# Patient Record
Sex: Female | Born: 1945
Health system: Southern US, Community
[De-identification: ages and names within clinical notes are randomized; demographics above are authoritative.]

## PROBLEM LIST (undated history)

## (undated) DIAGNOSIS — M1990 Unspecified osteoarthritis, unspecified site: Secondary | ICD-10-CM

## (undated) DIAGNOSIS — J45909 Unspecified asthma, uncomplicated: Secondary | ICD-10-CM

## (undated) DIAGNOSIS — R011 Cardiac murmur, unspecified: Secondary | ICD-10-CM

## (undated) DIAGNOSIS — K219 Gastro-esophageal reflux disease without esophagitis: Secondary | ICD-10-CM

## (undated) DIAGNOSIS — L0390 Cellulitis, unspecified: Secondary | ICD-10-CM

## (undated) DIAGNOSIS — I10 Essential (primary) hypertension: Secondary | ICD-10-CM

## (undated) DIAGNOSIS — I8390 Asymptomatic varicose veins of unspecified lower extremity: Secondary | ICD-10-CM

## (undated) DIAGNOSIS — C50919 Malignant neoplasm of unspecified site of unspecified female breast: Secondary | ICD-10-CM

## (undated) DIAGNOSIS — R7301 Impaired fasting glucose: Secondary | ICD-10-CM

## (undated) DIAGNOSIS — N649 Disorder of breast, unspecified: Secondary | ICD-10-CM

## (undated) DIAGNOSIS — Z8719 Personal history of other diseases of the digestive system: Secondary | ICD-10-CM

## (undated) DIAGNOSIS — N816 Rectocele: Secondary | ICD-10-CM

## (undated) DIAGNOSIS — E785 Hyperlipidemia, unspecified: Secondary | ICD-10-CM

## (undated) DIAGNOSIS — N63 Unspecified lump in unspecified breast: Secondary | ICD-10-CM

## (undated) HISTORY — DX: Disorder of breast, unspecified: N64.9

## (undated) HISTORY — DX: Unspecified lump in unspecified breast: N63.0

## (undated) HISTORY — PX: BREAST SURGERY: SHX581

## (undated) HISTORY — PX: HERNIA REPAIR: SHX51

## (undated) HISTORY — DX: Hyperlipidemia, unspecified: E78.5

## (undated) HISTORY — DX: Unspecified asthma, uncomplicated: J45.909

## (undated) HISTORY — DX: Essential (primary) hypertension: I10

## (undated) HISTORY — DX: Malignant neoplasm of unspecified site of unspecified female breast: C50.919

## (undated) HISTORY — PX: VAGINAL HYSTERECTOMY: SUR661

## (undated) HISTORY — DX: Cellulitis, unspecified: L03.90

## (undated) HISTORY — PX: LASER ABLATION OF VASCULAR LESION: SHX1950

## (undated) HISTORY — DX: Rectocele: N81.6

## (undated) HISTORY — DX: Impaired fasting glucose: R73.01

## (undated) HISTORY — PX: UPPER GASTROINTESTINAL ENDOSCOPY: SHX188

## (undated) HISTORY — PX: COLONOSCOPY: SHX174

---
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
## 1999-12-02 ENCOUNTER — Encounter (INDEPENDENT_AMBULATORY_CARE_PROVIDER_SITE_OTHER): Payer: Self-pay | Admitting: Specialist

## 1999-12-02 ENCOUNTER — Ambulatory Visit (HOSPITAL_BASED_OUTPATIENT_CLINIC_OR_DEPARTMENT_OTHER): Admission: RE | Admit: 1999-12-02 | Discharge: 1999-12-02 | Payer: Self-pay | Admitting: Surgery

## 2001-02-13 ENCOUNTER — Ambulatory Visit (HOSPITAL_COMMUNITY): Admission: RE | Admit: 2001-02-13 | Discharge: 2001-02-13 | Payer: Self-pay | Admitting: Obstetrics and Gynecology

## 2001-02-13 ENCOUNTER — Encounter: Payer: Self-pay | Admitting: Obstetrics and Gynecology

## 2002-02-15 ENCOUNTER — Encounter: Payer: Self-pay | Admitting: Surgery

## 2002-02-15 ENCOUNTER — Ambulatory Visit (HOSPITAL_COMMUNITY): Admission: RE | Admit: 2002-02-15 | Discharge: 2002-02-15 | Payer: Self-pay | Admitting: Surgery

## 2003-01-31 ENCOUNTER — Ambulatory Visit (HOSPITAL_COMMUNITY): Admission: RE | Admit: 2003-01-31 | Discharge: 2003-01-31 | Payer: Self-pay | Admitting: Internal Medicine

## 2003-02-17 ENCOUNTER — Ambulatory Visit (HOSPITAL_COMMUNITY): Admission: RE | Admit: 2003-02-17 | Discharge: 2003-02-17 | Payer: Self-pay | Admitting: Obstetrics and Gynecology

## 2003-02-17 ENCOUNTER — Encounter: Payer: Self-pay | Admitting: Obstetrics and Gynecology

## 2004-06-10 ENCOUNTER — Ambulatory Visit (HOSPITAL_COMMUNITY): Admission: RE | Admit: 2004-06-10 | Discharge: 2004-06-10 | Payer: Self-pay | Admitting: Family Medicine

## 2005-01-18 ENCOUNTER — Ambulatory Visit (HOSPITAL_COMMUNITY): Admission: RE | Admit: 2005-01-18 | Discharge: 2005-01-18 | Payer: Self-pay | Admitting: Family Medicine

## 2005-05-10 ENCOUNTER — Ambulatory Visit: Payer: Self-pay | Admitting: Internal Medicine

## 2005-06-14 ENCOUNTER — Ambulatory Visit (HOSPITAL_COMMUNITY): Admission: RE | Admit: 2005-06-14 | Discharge: 2005-06-14 | Payer: Self-pay | Admitting: Family Medicine

## 2006-06-19 ENCOUNTER — Ambulatory Visit (HOSPITAL_COMMUNITY): Admission: RE | Admit: 2006-06-19 | Discharge: 2006-06-19 | Payer: Self-pay | Admitting: Family Medicine

## 2007-01-22 ENCOUNTER — Ambulatory Visit (HOSPITAL_COMMUNITY): Admission: RE | Admit: 2007-01-22 | Discharge: 2007-01-22 | Payer: Self-pay | Admitting: Family Medicine

## 2007-06-21 ENCOUNTER — Ambulatory Visit (HOSPITAL_COMMUNITY): Admission: RE | Admit: 2007-06-21 | Discharge: 2007-06-21 | Payer: Self-pay | Admitting: Family Medicine

## 2007-06-22 ENCOUNTER — Ambulatory Visit (HOSPITAL_COMMUNITY): Admission: RE | Admit: 2007-06-22 | Discharge: 2007-06-22 | Payer: Self-pay | Admitting: Family Medicine

## 2008-07-09 ENCOUNTER — Ambulatory Visit (HOSPITAL_COMMUNITY): Admission: RE | Admit: 2008-07-09 | Discharge: 2008-07-09 | Payer: Self-pay | Admitting: Family Medicine

## 2009-06-02 ENCOUNTER — Ambulatory Visit (HOSPITAL_COMMUNITY): Admission: RE | Admit: 2009-06-02 | Discharge: 2009-06-02 | Payer: Self-pay | Admitting: General Surgery

## 2009-07-10 ENCOUNTER — Ambulatory Visit (HOSPITAL_COMMUNITY): Admission: RE | Admit: 2009-07-10 | Discharge: 2009-07-10 | Payer: Self-pay | Admitting: Family Medicine

## 2010-07-13 ENCOUNTER — Ambulatory Visit (HOSPITAL_COMMUNITY): Admission: RE | Admit: 2010-07-13 | Payer: Self-pay | Source: Home / Self Care | Admitting: Family Medicine

## 2010-08-22 ENCOUNTER — Encounter: Payer: Self-pay | Admitting: Family Medicine

## 2010-08-31 ENCOUNTER — Ambulatory Visit (HOSPITAL_COMMUNITY)
Admission: RE | Admit: 2010-08-31 | Discharge: 2010-08-31 | Payer: Self-pay | Source: Home / Self Care | Attending: Family Medicine | Admitting: Family Medicine

## 2010-09-03 ENCOUNTER — Emergency Department (HOSPITAL_COMMUNITY)
Admit: 2010-09-03 | Discharge: 2010-09-03 | Disposition: A | Discharge status: Home / Self Care | Payer: BC Managed Care – PPO

## 2010-09-03 ENCOUNTER — Emergency Department (HOSPITAL_COMMUNITY)
Admission: EM | Admit: 2010-09-03 | Discharge: 2010-09-03 | Disposition: A | Discharge status: Home / Self Care | Payer: BC Managed Care – PPO | Attending: Emergency Medicine | Admitting: Emergency Medicine

## 2010-09-03 DIAGNOSIS — K219 Gastro-esophageal reflux disease without esophagitis: Secondary | ICD-10-CM | POA: Insufficient documentation

## 2010-09-03 DIAGNOSIS — K8020 Calculus of gallbladder without cholecystitis without obstruction: Secondary | ICD-10-CM | POA: Insufficient documentation

## 2010-09-03 DIAGNOSIS — R10819 Abdominal tenderness, unspecified site: Secondary | ICD-10-CM | POA: Insufficient documentation

## 2010-09-03 DIAGNOSIS — R110 Nausea: Secondary | ICD-10-CM | POA: Insufficient documentation

## 2010-09-03 DIAGNOSIS — J45909 Unspecified asthma, uncomplicated: Secondary | ICD-10-CM | POA: Insufficient documentation

## 2010-09-03 DIAGNOSIS — R109 Unspecified abdominal pain: Secondary | ICD-10-CM | POA: Insufficient documentation

## 2010-09-03 DIAGNOSIS — M549 Dorsalgia, unspecified: Secondary | ICD-10-CM | POA: Insufficient documentation

## 2010-09-03 DIAGNOSIS — I10 Essential (primary) hypertension: Secondary | ICD-10-CM | POA: Insufficient documentation

## 2010-09-03 DIAGNOSIS — N390 Urinary tract infection, site not specified: Secondary | ICD-10-CM | POA: Insufficient documentation

## 2010-09-03 DIAGNOSIS — E785 Hyperlipidemia, unspecified: Secondary | ICD-10-CM | POA: Insufficient documentation

## 2010-09-03 LAB — URINE MICROSCOPIC-ADD ON

## 2010-09-03 LAB — CBC
HCT: 36.1 % (ref 36.0–46.0)
Hemoglobin: 12.8 g/dL (ref 12.0–15.0)
MCH: 32.5 pg (ref 26.0–34.0)
MCHC: 35.5 g/dL (ref 30.0–36.0)
MCV: 91.6 fL (ref 78.0–100.0)
Platelets: 269 10*3/uL (ref 150–400)
RBC: 3.94 MIL/uL (ref 3.87–5.11)
RDW: 12.1 % (ref 11.5–15.5)
WBC: 11.9 10*3/uL — ABNORMAL HIGH (ref 4.0–10.5)

## 2010-09-03 LAB — URINALYSIS, ROUTINE W REFLEX MICROSCOPIC
Bilirubin Urine: NEGATIVE
Ketones, ur: NEGATIVE mg/dL
Nitrite: NEGATIVE
Protein, ur: NEGATIVE mg/dL
Specific Gravity, Urine: 1.02 (ref 1.005–1.030)
Urine Glucose, Fasting: NEGATIVE mg/dL
Urobilinogen, UA: 0.2 mg/dL (ref 0.0–1.0)
pH: 7 (ref 5.0–8.0)

## 2010-09-03 LAB — DIFFERENTIAL
Basophils Absolute: 0 K/uL (ref 0.0–0.1)
Basophils Relative: 0 % (ref 0–1)
Eosinophils Absolute: 0.1 10*3/uL (ref 0.0–0.7)
Eosinophils Relative: 1 % (ref 0–5)
Lymphocytes Relative: 9 % — ABNORMAL LOW (ref 12–46)
Lymphs Abs: 1.1 10*3/uL (ref 0.7–4.0)
Monocytes Absolute: 0.9 10*3/uL (ref 0.1–1.0)
Monocytes Relative: 8 % (ref 3–12)
Neutro Abs: 9.8 10*3/uL — ABNORMAL HIGH (ref 1.7–7.7)
Neutrophils Relative %: 83 % — ABNORMAL HIGH (ref 43–77)

## 2010-09-03 LAB — COMPREHENSIVE METABOLIC PANEL
ALT: 38 U/L — ABNORMAL HIGH (ref 0–35)
AST: 39 U/L — ABNORMAL HIGH (ref 0–37)
Alkaline Phosphatase: 92 U/L (ref 39–117)
CO2: 27 mEq/L (ref 19–32)
Calcium: 9.5 mg/dL (ref 8.4–10.5)
Chloride: 94 mEq/L — ABNORMAL LOW (ref 96–112)
Creatinine, Ser: 0.62 mg/dL (ref 0.4–1.2)
GFR calc non Af Amer: >60 mL/min (ref >60)
Glucose, Bld: 122 mg/dL — ABNORMAL HIGH (ref 70–99)
Potassium: 3.5 mEq/L (ref 3.5–5.1)
Total Bilirubin: 1 mg/dL (ref 0.3–1.2)
Total Protein: 7.3 g/dL (ref 6.0–8.3)

## 2010-09-03 LAB — LIPASE, BLOOD: Lipase: 16 U/L (ref 11–59)

## 2010-09-03 LAB — COMPREHENSIVE METABOLIC PANEL WITH GFR
Albumin: 4 g/dL (ref 3.5–5.2)
BUN: 5 mg/dL — ABNORMAL LOW (ref 6–23)
GFR calc Af Amer: >60 mL/min (ref >60)
Sodium: 130 meq/L — ABNORMAL LOW (ref 135–145)

## 2010-09-07 ENCOUNTER — Encounter (HOSPITAL_COMMUNITY): Payer: BC Managed Care – PPO

## 2010-09-07 ENCOUNTER — Other Ambulatory Visit (HOSPITAL_COMMUNITY): Payer: Self-pay | Admitting: Family Medicine

## 2010-09-07 ENCOUNTER — Other Ambulatory Visit: Payer: Self-pay | Admitting: General Surgery

## 2010-09-07 DIAGNOSIS — Z01812 Encounter for preprocedural laboratory examination: Secondary | ICD-10-CM | POA: Insufficient documentation

## 2010-09-07 LAB — SURGICAL PCR SCREEN: MRSA, PCR: NEGATIVE

## 2010-09-08 ENCOUNTER — Ambulatory Visit (HOSPITAL_COMMUNITY)
Admission: RE | Admit: 2010-09-08 | Discharge: 2010-09-08 | Disposition: A | Discharge status: Home / Self Care | Payer: BC Managed Care – PPO | Source: Ambulatory Visit | Attending: General Surgery | Admitting: General Surgery

## 2010-09-08 ENCOUNTER — Other Ambulatory Visit: Payer: Self-pay | Admitting: General Surgery

## 2010-09-08 DIAGNOSIS — Z7982 Long term (current) use of aspirin: Secondary | ICD-10-CM | POA: Insufficient documentation

## 2010-09-08 DIAGNOSIS — Z79899 Other long term (current) drug therapy: Secondary | ICD-10-CM | POA: Insufficient documentation

## 2010-09-08 DIAGNOSIS — K8010 Calculus of gallbladder with chronic cholecystitis without obstruction: Secondary | ICD-10-CM | POA: Insufficient documentation

## 2010-09-08 DIAGNOSIS — K429 Umbilical hernia without obstruction or gangrene: Secondary | ICD-10-CM | POA: Insufficient documentation

## 2010-09-08 DIAGNOSIS — I10 Essential (primary) hypertension: Secondary | ICD-10-CM | POA: Insufficient documentation

## 2010-09-08 HISTORY — PX: CHOLECYSTECTOMY: SHX55

## 2010-09-09 ENCOUNTER — Other Ambulatory Visit (HOSPITAL_COMMUNITY): Payer: BC Managed Care – PPO

## 2010-09-12 NOTE — Op Note (Signed)
NAME:  Caitlin [name], Caitlin [name]               ACCOUNT NO.:  [id number]  MEDICAL RECORD NO.:  [id number]           PATIENT TYPE:  O  LOCATION:  DAYP                          FACILITY:  [facility]  PHYSICIAN:  Dalia Heading, M.D.  DATE OF BIRTH:  1946/06/15  DATE OF PROCEDURE:  09/08/2010 DATE OF DISCHARGE:                              OPERATIVE REPORT   PREOPERATIVE DIAGNOSES:  Cholecystitis, cholelithiasis, umbilical hernia.  POSTOPERATIVE DIAGNOSES:  Cholecystitis, cholelithiasis, umbilical hernia, hydrops of the gallbladder.  PROCEDURES:  Laparoscopic cholecystectomy, umbilical herniorrhaphy with mesh.  SURGEON:  Dalia Heading, MD  ANESTHESIA:  General endotracheal.  INDICATIONS:  The patient is a 65 year old white female who presents with cholecystitis secondary to cholelithiasis as well as an umbilical hernia which is symptomatic.  The risks and benefits of the procedures including bleeding, infection, hepatobiliary injury, the possibility of recurrence of the hernia, and the possibility of an open procedure were fully explained to the patient, gave informed consent.  PROCEDURE NOTE:  The patient was placed in the supine position.  After induction of general endotracheal anesthesia, the abdomen was prepped and draped using the usual sterile technique with DuraPrep.  Surgical site confirmation was performed.  A supraumbilical incision was made down to the fascia.  A Veress needle was introduced into the abdominal cavity and confirmation of placement was done using the saline drop test.  The abdomen was then insufflated to 16 mmHg pressure.  An 11-mm trocar was introduced into the abdominal cavity under direct visualization without difficulty.  The patient was placed in reversed Trendelenburg position.  An additional 11-mm trocar was placed in the epigastric region and 5-mm trocars was placed in the right upper quadrant and right flank regions.  The liver was inspected and noted to  be somewhat generous, but this was in line with her morbid obesity.  The gallbladder was noted to have a thickened gallbladder wall and distended.  An incision was made into the fundus of the gallbladder and hydrops of the gallbladder was found.  It was decompressed using suction.  The gallbladder was then retracted superiorly and laterally. The dissection was begun around the infundibulum of the gallbladder. The cystic duct was first identified.  The juncture to the infundibulum fully identified.  A stone was in the neck of the gallbladder and this was milked into the lumen of the gallbladder.  A standard Endo-GIA was then placed across the cystic duct and fired.  The cystic artery was likewise ligated and divided using clips.  The gallbladder was freed away from the gallbladder fossa using Bovie electrocautery.  The gallbladder was delivered through the umbilical site using an EndoCatch bag.  It was sent to Pathology for further examination.  Any bleeding was controlled using Bovie electrocautery.  The staple lines were inspected and no abnormal bile leakage or blood was noted.  The right upper quadrant was copiously irrigated with normal saline.  Surgicel was placed into the gallbladder fossa.  All fluid and air were then evacuated from the abdominal cavity prior to the removal of the trocars.  The supraumbilical incision was extended as the patient had  an umbilical hernia.  The umbilicus was freed away from the underlying hernia sac. The hernia sac was excised to the fascia without difficulty.  A large ventral patch was then placed and secured to the fascia using 2-0 Ethibond interrupted sutures.  Fascia was closed over this repair using 0 Ethibond interrupted sutures.  The base of the umbilicus was secured back to the fascia using a 2-0 Vicryl interrupted suture.  The subcutaneous layer was reapproximated using 3-0 Vicryl interrupted sutures.  All skin incisions were closed then  using staples.  A 0.5% Sensorcaine was instilled into all wounds.  Betadine ointment and dry sterile dressings were applied.  All tape and needle counts were correct at the end of the procedure. The patient was extubated in the operating room and went back to the recovery room awake in stable condition.  COMPLICATIONS:  None.  SPECIMEN:  Gallbladder.  BLOOD LOSS:  Minimal.     Dalia Heading, M.D.     MAJ/MEDQ  D:  09/08/2010  T:  09/09/2010  Job:  [id number]  cc:   Corrie Mckusick, M.D. Fax: [phone]  Electronically Signed by Franky Macho M.D. on 09/10/2010 01:24:54 PM

## 2010-09-12 NOTE — H&P (Signed)
  NAME:  Caitlin [name], Caitlin [name]               ACCOUNT NO.:  [id number]  MEDICAL RECORD NO.:  [id number]           PATIENT TYPE:  O  LOCATION:  DAY                           FACILITY:  [facility]  PHYSICIAN:  Dalia Heading, M.D.  DATE OF BIRTH:  05/31/1946  DATE OF ADMISSION:  09/07/2010 DATE OF DISCHARGE:  09/28/2011LH                             HISTORY & PHYSICAL   CHIEF COMPLAINT:  Cholecystitis, cholelithiasis, umbilical hernia.  HISTORY OF PRESENT ILLNESS:  The patient is a 65 year old white female who is referred for evaluation and treatment of biliary colic secondary to cholelithiasis.  She has been having intermittent right upper quadrant abdominal pain with radiation to the flank, nausea, and indigestion for the past few weeks.  It is made worse with fatty foods. No fever, chills, jaundice have been noted.  She also has an enlarging umbilical hernia.  PAST MEDICAL HISTORY:  Includes morbid obesity, extrinsic allergies, hypertension, high cholesterol levels.  PAST SURGICAL HISTORY:  Hysterectomy, breast cyst removal.  CURRENT MEDICATIONS: 1. Omeprazole 40 mg p.o. daily. 2. Singulair 10 mg p.o. daily. 3. Multivitamin 1 tablet p.o. daily. 4. Micardis HCT 80/12.5 mg one-half tablet p.o. daily. 5. Crestor 20 mg p.o. daily. 6. Asmanex metered-dose 1 puff daily p.r.n. 7. Ambien 10 mg p.o. at bedtime p.r.n. 8. Ciprofloxacin 500 mg p.o. b.i.d. 9. Hydrocodone 7.5/325 mg 1-2 tablets p.o. q.4 h. p.r.n. pain.  ALLERGIES:  TETANUS and ACE INHIBITORS.  REVIEW OF SYSTEMS:  The patient denies drinking or smoking.  She denies any other cardiopulmonary difficulties or bleeding disorders.  FAMILY MEDICAL HISTORY:  Noncontributory.  PHYSICAL EXAMINATION:  GENERAL:  The patient is an obese white female in no acute distress. HEENT:  Examination reveals no scleral icterus. LUNGS:  Clear to auscultation with equal breath sounds bilaterally. HEART:  Regular rate and rhythm without S3, S4,  murmurs. ABDOMEN:  Soft and nondistended.  She is tender in the right upper quadrant to palpation.  No hepatosplenomegaly or masses are noted.  A reducible umbilical hernia is present.  DATA:  Ultrasound of the gallbladder reveals cholelithiasis with a normal common bile duct.  Liver enzyme tests are within normal limits except for slightly elevated SGOT and SGPT.  IMPRESSION: 1. Cholecystitis, cholelithiasis. 2. Umbilical hernia.  PLAN:  The patient is scheduled for laparoscopic cholecystectomy with umbilical herniorrhaphy with mesh on September 08, 2010.  The risks and benefits of the procedure including bleeding, infection, hepatobiliary injury, recurrence of the hernia, and the possibility of an open procedure were fully explained to the patient, gave informed consent.     Dalia Heading, M.D.     MAJ/MEDQ  D:  09/07/2010  T:  09/07/2010  Job:  [id number]  cc:   Corrie Mckusick, M.D. Fax: [phone]  Short Stay in [facility]  Electronically Signed by Franky Macho M.D. on 09/10/2010 01:24:52 PM

## 2010-09-13 ENCOUNTER — Ambulatory Visit (HOSPITAL_COMMUNITY)
Admission: RE | Admit: 2010-09-13 | Discharge: 2010-09-13 | Disposition: A | Discharge status: Home / Self Care | Payer: BC Managed Care – PPO | Source: Ambulatory Visit | Attending: Family Medicine | Admitting: Family Medicine

## 2010-12-17 NOTE — Op Note (Signed)
[facility]  Patient:    Caitlin [name], Caitlin [name]                      MRN: [id number] Proc. Date: 12/02/99 Adm. Date:  [id number] Disc. Date: [id number] Attending:  Charlton Haws CC:         Dr. ____________, Jonita Albee             Dr. Almetta Lovely, [facility]             Department of Radiology, [facility], ATTN: Dr. Onalee Hua                           Operative Report  OFFICE MEDICAL RECORD #: [id number]  PREOPERATIVE DIAGNOSIS:  Left breast mass.  POSTOPERATIVE DIAGNOSIS:  Left breast mass.  OPERATION:  Removal of left breast mass.  SURGEON:  Currie Paris, M.D.  ASSISTANTLorin Picket Long, PA-Student.  ANESTHESIA:  MAC.  CLINICAL HISTORY:  This patient is a 65 year old, who has had several small breast masses removed in the past, all of which have been benign.  She has now presented with a new mass in the very medial aspect of the left breast that was solid on ultrasound and a little bit irregular.  DESCRIPTION OF PROCEDURE:  Patient brought to the operating room and the area in question was identified by the patient and myself and marked.  She was then given IV sedation.  The breast was then prepped and draped.  A transverse incision made overlying the mass and with palpation was able to identify it and tried to excise it with some surrounding fibrofatty tissue down almost to the chest wall.  The mass was contained in the specimen, was palpable and suture placed through it for pathology to orient him since there was a fair amount of fatty tissue around it.  The wound was checked for hemostasis and was dry, was closed with 3-0 Vicryl, followed by 4-0 Monocryl subcuticular plus Steri-Strips.  Patient tolerated the procedure well.  There are no operative complications.  All counts were correct. DD:  12/02/99 TD:  12/04/99 Job: [id number]

## 2010-12-17 NOTE — Op Note (Signed)
NAME:  Caitlin [name], Caitlin [name]                         ACCOUNT NO.:  [id number]   MEDICAL RECORD NO.:  [id number]                   PATIENT TYPE:  AMB   LOCATION:  DAY                                  FACILITY:  [facility]   PHYSICIAN:  Lionel December, M.D.                 DATE OF BIRTH:  01-Apr-1946   DATE OF PROCEDURE:  01/31/2003  DATE OF DISCHARGE:                                  PROCEDURE NOTE   PROCEDURE:  Esophagogastroduodenoscopy with esophageal dilatation followed  by total colonoscopy.   INDICATIONS:  Caitlin [name] is a 65 year old Caucasian female with recurrent solid  food dysphagia.  She has history of distal esophageal ring.  Last underwent  EGD in 1998.  She is also undergoing screening colonoscopy.  Both procedures  were reviewed with the patient and informed consent was obtained.   PREMEDICATION:  Cetacaine spray for pharyngeal topical anesthesia, Demerol  50 mg IV, Versed 8 mg IV in divided doses.   FINDINGS:  The procedures were performed in the endoscopy suite.  The  patient's vital signs and O2 saturation were monitored during the procedure  and remained stable.   ESOPHAGOGASTRODUODENOSCOPY:  The patient was placed in the left lateral  recumbent position and endoscope was passed through the oropharynx without  any difficulty into the esophagus.   Esophagus:  Mucosa of the esophagus was normal except she had a triangular  erosion at GE junction along with a ring and sliding hiatal hernia which is  about 4 cm length.  She also had some rings in the distal esophagus but they  were not felt to be critical.   Stomach:  It was empty and distended very well on insufflation.  Folds of  the proximal stomach were normal.  Examination of the mucosa was normal at  body, antrum and pyloric channel, as well as angularis, fundus and cardia  was normal.   Duodenum:  Examination of the bulb and second part of the duodenum was  normal.   The esophagus was dilated by passing 56-French  Maloney dilators through the  esophagus to full insertion.  As the dilator was withdrawn, the endoscope  was passed again.  There were two tears, one at the GE junction, another one  proximal to it.  There was another ring or two.   The endoscope was withdrawn and the patient was prepared for procedure #2.   TOTAL COLONOSCOPY:  Rectal examination performed.  No abnormality noted on  the external or digital exam.  Scope was placed in the rectum and advanced  under vision to the sigmoid colon and beyond.  Preparation was satisfactory.  Scope was passed to the cecum which was identified by the ileocecal valve  and appendiceal orifice.  Pictures were taken for the record.  As the scope  was withdrawn, colonic mucosa was carefully examined.  There were scattered  diverticula in the transverse colon.  A few  more at the descending but most  of the diverticula were at the sigmoid colon and they were moderate in  number.  Mucosa was normal throughout. Rectal mucosa was normal.  Scope was  retroflexed to examine the anorectal junction and small hemorrhoids were  noted below the dentate line.  Endoscope was straightened and withdrawn. The  patient tolerated the procedure well.   FINAL DIAGNOSIS:  1. Two esophageal rings, one at the distal esophagus and the second one at     gastroesophageal junction.  2. Erosive reflux esophagitis and small sliding hiatal hernia.  3. Esophagus dilated by passing 56-French Maloney dilator disrupting both of     these areas.  4. Normal exam of the stomach with second part of the duodenum.  5. Colonic diverticulosis which involves the transverse, descending and     sigmoid colon.  6. Small external hemorrhoids.   RECOMMENDATIONS:  1. Anti-reflux measures.  2. Will start her on omeprazole 20 mg p.o. q.a.m. and feel she needs to stay     on it chronically for at least five days a week.  3. High fiber diet.  If she does not eat a high fiber diet, she may consider      taking Citrucel or equivalent, one tablespoonful daily.                                                 Lionel December, M.D.    NR/MEDQ  D:  01/31/2003  T:  02/01/2003  Job:  [id number]   cc:   Patrica Duel, M.D.  [address], [address]  [place]  [place] [address]  Fax: [phone]

## 2010-12-17 NOTE — Consult Note (Signed)
NAME:  Caitlin [name], Caitlin [name]                    ACCOUNT NO.:  [id number]   MEDICAL RECORD NO.:  [id number]                  PATIENT TYPE:   LOCATION:                                       FACILITY:   PHYSICIAN:  Lionel December, M.D.                 DATE OF BIRTH:  1946-03-06   DATE OF CONSULTATION:  01/27/2003  DATE OF DISCHARGE:                                   CONSULTATION   PRESENTING COMPLAINT:  Solid food dysphagia.   HISTORY OF PRESENT ILLNESS:  Caitlin [name] is a 65 year old Caucasian female who  was initially referred through courtesy of Dr. Nobie Putnam for EGD and ED  because of solid food dysphagia, but she requested office visit prior to  scheduling any procedure.  She has history of distal esophageal ring.  This  was last dilated in July 1998.  At that time she was also documented to have  small sliding hiatal hernia and erosive gastritis.  Her CLOtest was  negative.  She responded to esophageal dilatation.  She also had erosive  reflux esophagitis and was on Prilosec and then an H2 blocker, but she is  not sure how long she stayed on these medications.  She has done well until  last summer when she began to have intermittent solid food dysphagia, and it  gradually has been occurring more and more frequently, and now it has been  every week.  She has difficulty primarily with solids and particularly with  meats.  With few exceptions, she has been able to get a spontaneous relief.  Otherwise she has induced vomiting for relief.  She very rarely experiences  heartburn.  She denies hoarseness, cough, abdominal pain, melena, or rectal  bleeding.  She has a good appetite.  Her weight has been stable over the  last one year.  She watches her diet and drinks diet drinks only.   MEDICATIONS:  She is on:  1. Vitamin E.  2. Calcium with vitamin D daily.  3. Tums p.r.n.  4. Tylenol PM p.r.n.   PAST MEDICAL HISTORY:  1. She has had benign lesion removed from her left breast x3.  2.  She had a hysterectomy in 1980s.  3. She has had tonsillectomy.  4. History of Schatzki's ring as, above, which was dilated in July 1998.   ALLERGIES:  TETANUS TOXOID, which apparently resulted in asystole.   FAMILY HISTORY:  Mother is 41 years old and has dementia.  Father is 53.  He  has been treated for prostate CA, and he also had low anterior resection for  colon carcinoma within the last year or so.  She has three sisters.  One has  breathing problems, and others are in good health.   SOCIAL HISTORY:  She is married, and she has two healthy children.  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  She does not smoke  cigarettes and drinks alcohol socially, 0-1 drink per  day.   PHYSICAL EXAMINATION:  GENERAL:  Pleasant, mildly obese Caucasian female who  is in no acute distress.  VITAL SIGNS:  She weighs 231 pounds.  She is 5 feet 6 inches tall.  Pulse 82  per minute, blood pressure 140/100, temperature is 98.5.  HEENT:  Conjunctivae pink, sclerae nonicteric.  Oropharyngeal mucosa is  normal.  NECK:  Without masses or thyromegaly.  CARDIAC:  Regular rhythm.  Normal S1, S2.  No murmur or gallop noted.  LUNGS:  Clear to auscultation.  ABDOMEN:  Full.  Bowel sounds are normal.  Palpation reveals soft abdomen  without tenderness, organomegaly, or masses.  RECTAL:  Deferred.  No peripheral edema or clubbing noted.   ASSESSMENT:  Caitlin [name] has intermittent solid food dysphagia.  She does not  have typical symptoms of reflux esophagitis, but she was noted to have  erosive esophagitis on EGD about six years ago.  Therefore, she may have a  stricture rather than Schatzki's ring.   Family history is significant for colon carcinoma, but this occurred in her  father in his 69s.  Therefore, her risk may be somewhat elevated.  She needs  to be screened for colorectal carcinoma.   RECOMMENDATIONS:  Diagnostic EGD with ED, followed by screening colonoscopy  to be performed at [facility] in  the near future.  I have reviewed procedures with  the patient, and she is agreeable.   I would like to thank Dr. Nobie Putnam for the opportunity to participate in the  care of this nice lady.                                               Lionel December, M.D.    NR/MEDQ  D:  01/27/2003  T:  01/28/2003  Job:  [id number]   cc:   Patrica Duel, M.D.  [address], [address]  [place]  [place] [address]  Fax: [phone]

## 2010-12-20 ENCOUNTER — Other Ambulatory Visit: Payer: Self-pay | Admitting: General Surgery

## 2010-12-20 ENCOUNTER — Encounter (HOSPITAL_COMMUNITY): Payer: BC Managed Care – PPO

## 2010-12-20 LAB — BASIC METABOLIC PANEL
BUN: 9 mg/dL (ref 6–23)
CO2: 27 mEq/L (ref 19–32)
Chloride: 97 mEq/L (ref 96–112)
GFR calc Af Amer: >60 mL/min (ref >60)

## 2010-12-20 LAB — CBC
Hemoglobin: 12.3 g/dL (ref 12.0–15.0)
MCH: 31.7 pg (ref 26.0–34.0)
Platelets: 284 10*3/uL (ref 150–400)
RBC: 3.88 MIL/uL (ref 3.87–5.11)
RDW: 13.2 % (ref 11.5–15.5)

## 2010-12-21 NOTE — H&P (Signed)
  NAME:  Caitlin [name], Caitlin [name]               ACCOUNT NO.:  [id number]  MEDICAL RECORD NO.:  [id number]           PATIENT TYPE:  LOCATION:                                 FACILITY:  PHYSICIAN:  Dalia Heading, M.D.  DATE OF BIRTH:  08-16-1945  DATE OF ADMISSION:  12/24/2010 DATE OF DISCHARGE:  LH                             HISTORY & PHYSICAL   CHIEF COMPLAINT:  Incisional hernia.  HISTORY OF PRESENT ILLNESS:  The patient is a 65 year old white female who presents with umbilical swelling.  This occurred after lifting something heavy recently.  She is status post laparoscopic cholecystectomy with umbilical herniorrhaphy with mesh on September 08, 2010.  It is made worse with straining.  No nausea or vomiting have been noted.  PAST MEDICAL HISTORY: 1. Hypertension. 2. High cholesterol levels. 3. Obesity. 4. Extrinsic allergies.  PAST SURGICAL HISTORY:  As noted above, hysterectomy, breast biopsies, tonsillectomy.  CURRENT MEDICATIONS:  Omeprazole, Singulair, Micardis, Crestor, Asmanex, Ambien.  ALLERGIES:  TETANUS and ACE INHIBITORS.  REVIEW OF SYSTEMS:  The patient drinks alcohol socially.  She denies any illicit drug use or tobacco use.  She denies any other cardiopulmonary difficulties or bleeding disorders.  FAMILY MEDICAL HISTORY:  Remarkable for father with rectal cancer who has since passed away and mother with breast cancer.  PHYSICAL EXAMINATION:  GENERAL:  The patient is a well-developed, well- nourished white female in no acute distress.  She is afebrile. VITAL SIGNS:  Stable. HEENT:  Unremarkable. NECK:  Supple without carotid bruits. HEART:  Regular rate and rhythm without S3, S4, or murmurs. ABDOMEN:  Soft with hernia present along the right lateral aspect of a supraumbilical incision.  It is reducible.  Surgical scars are well- healed.  No other hepatosplenomegaly or masses are noted.  IMPRESSION:  Incisional hernia.  PLAN:  The patient is scheduled for an  incisional herniorrhaphy with mesh on Dec 24, 2010.  Risks and benefits of the procedure including bleeding, infection, and recurrence of hernia were fully explained to the patient, gave informed consent.     Dalia Heading, M.D.     MAJ/MEDQ  D:  12/14/2010  T:  12/15/2010  Job:  [id number]  cc:   Short Stay at [facility]  Corrie Mckusick, M.D. Fax: [phone]  Electronically Signed by Franky Macho M.D. on 12/21/2010 09:02:37 AM

## 2010-12-24 ENCOUNTER — Ambulatory Visit (HOSPITAL_COMMUNITY)
Admission: RE | Admit: 2010-12-24 | Discharge: 2010-12-24 | Disposition: A | Discharge status: Home / Self Care | Payer: BC Managed Care – PPO | Source: Ambulatory Visit | Attending: General Surgery | Admitting: General Surgery

## 2010-12-24 DIAGNOSIS — K432 Incisional hernia without obstruction or gangrene: Secondary | ICD-10-CM | POA: Insufficient documentation

## 2010-12-24 DIAGNOSIS — E7800 Pure hypercholesterolemia, unspecified: Secondary | ICD-10-CM | POA: Insufficient documentation

## 2010-12-24 DIAGNOSIS — I10 Essential (primary) hypertension: Secondary | ICD-10-CM | POA: Insufficient documentation

## 2010-12-27 NOTE — Op Note (Signed)
  NAME:  Caitlin [name], Caitlin [name]               ACCOUNT NO.:  [id number]  MEDICAL RECORD NO.:  [id number]           PATIENT TYPE:  O  LOCATION:  DAYP                          FACILITY:  [facility]  PHYSICIAN:  Dalia Heading, M.D.  DATE OF BIRTH:  1945-11-16  DATE OF PROCEDURE:  12/24/2010 DATE OF DISCHARGE:                              OPERATIVE REPORT   PREOPERATIVE DIAGNOSIS:  Incisional hernia.  POSTOPERATIVE DIAGNOSIS:  Incisional hernia.  PROCEDURE:  Incisional herniorrhaphy with mesh.  SURGEON:  Dalia Heading, MD  ANESTHESIA:  General endotracheal.  INDICATIONS:  The patient is a 65 year old white female status post laparoscopic cholecystectomy with umbilical repair in February 2012 who presents with an incisional hernia at the umbilicus.  The patient now comes to the operating for incisional herniorrhaphy with mesh.  The risks and benefits of the procedure including bleeding, infection, pain, and recurrence of the hernia were fully explained to the patient, gave informed consent.  PROCEDURE NOTE:  The patient was placed in the supine position.  After induction of general endotracheal anesthesia, the abdomen was prepped and draped using usual sterile technique with DuraPrep.  Surgical site confirmation was performed.  A supraumbilical incision was made through the previous surgical scar. This was taken down to the fascia.  The patient was noted to have a hernia inferior into the right of the previous placed Proceed ventral mesh.  The old mesh was excised without difficulty.  There was no evidence of infection.  I suspect that the right inferior portion of the fascia separated away and this resulted in the recurrent hernia.  A larger size Proceed ventral patch was then placed and secured to the fascia using 0 Ethibond interrupted sutures.  The fascia was then closed over this in a transverse fashion using 0 Ethibond interrupted sutures. The base of the umbilicus was secured back to  the fascia using 2-0 Vicryl interrupted suture.  Subcutaneous layer was reapproximated using a 3-0 Vicryl interrupted suture.  The skin was closed using staples. 0.5% Sensorcaine was instilled in the surrounding wound.  Betadine ointment and dry sterile dressing were applied.  All tape and needle counts were correct at the end of procedure.  The patient was extubated in the operating room and went back to recovery room awake in stable condition.  COMPLICATIONS:  None.  SPECIMEN:  None.  BLOOD LOSS:  Minimal.     Dalia Heading, M.D.     MAJ/MEDQ  D:  12/24/2010  T:  12/25/2010  Job:  [id number]  cc:   Corrie Mckusick, M.D. Fax: [phone]  Electronically Signed by Franky Macho M.D. on 12/27/2010 08:38:00 PM

## 2011-08-09 ENCOUNTER — Other Ambulatory Visit (HOSPITAL_COMMUNITY): Payer: Self-pay | Admitting: Family Medicine

## 2011-08-09 DIAGNOSIS — Z139 Encounter for screening, unspecified: Secondary | ICD-10-CM

## 2011-09-05 ENCOUNTER — Ambulatory Visit (HOSPITAL_COMMUNITY)
Admission: RE | Admit: 2011-09-05 | Discharge: 2011-09-05 | Disposition: A | Discharge status: Home / Self Care | Payer: BC Managed Care – PPO | Source: Ambulatory Visit | Attending: Family Medicine | Admitting: Family Medicine

## 2011-09-05 DIAGNOSIS — Z1231 Encounter for screening mammogram for malignant neoplasm of breast: Secondary | ICD-10-CM | POA: Insufficient documentation

## 2011-09-05 DIAGNOSIS — Z139 Encounter for screening, unspecified: Secondary | ICD-10-CM

## 2012-03-14 ENCOUNTER — Ambulatory Visit (HOSPITAL_COMMUNITY)
Admission: RE | Admit: 2012-03-14 | Discharge: 2012-03-14 | Disposition: A | Discharge status: Home / Self Care | Payer: BC Managed Care – PPO | Source: Ambulatory Visit | Attending: Family Medicine | Admitting: Family Medicine

## 2012-03-14 ENCOUNTER — Other Ambulatory Visit (HOSPITAL_COMMUNITY): Payer: Self-pay | Admitting: Family Medicine

## 2012-03-14 DIAGNOSIS — R05 Cough: Secondary | ICD-10-CM

## 2012-03-14 DIAGNOSIS — R918 Other nonspecific abnormal finding of lung field: Secondary | ICD-10-CM | POA: Insufficient documentation

## 2012-03-14 DIAGNOSIS — J45909 Unspecified asthma, uncomplicated: Secondary | ICD-10-CM

## 2012-03-14 DIAGNOSIS — R059 Cough, unspecified: Secondary | ICD-10-CM | POA: Insufficient documentation

## 2012-03-26 ENCOUNTER — Encounter: Payer: Self-pay | Admitting: Internal Medicine

## 2012-03-27 ENCOUNTER — Ambulatory Visit (INDEPENDENT_AMBULATORY_CARE_PROVIDER_SITE_OTHER): Payer: BC Managed Care – PPO | Admitting: Internal Medicine

## 2012-03-27 ENCOUNTER — Encounter: Payer: Self-pay | Admitting: Internal Medicine

## 2012-03-27 VITALS — BP 140/76 | HR 86 | Temp 98.0°F | Ht 65.0 in | Wt 249.6 lb

## 2012-03-27 DIAGNOSIS — R05 Cough: Secondary | ICD-10-CM

## 2012-03-27 DIAGNOSIS — J45991 Cough variant asthma: Secondary | ICD-10-CM | POA: Insufficient documentation

## 2012-03-27 DIAGNOSIS — J9819 Other pulmonary collapse: Secondary | ICD-10-CM | POA: Insufficient documentation

## 2012-03-27 MED ORDER — ESOMEPRAZOLE MAGNESIUM 40 MG PO PACK: 40.0000 mg | PACK | Freq: Every day | ORAL | Status: DC

## 2012-03-27 MED ORDER — PREDNISONE (PAK) 10 MG PO TABS: ORAL_TABLET | ORAL | Status: AC

## 2012-03-27 NOTE — Assessment & Plan Note (Signed)
The most common causes of chronic cough in immunocompetent adults include the following: upper airway cough syndrome (UACS), previously referred to as postnasal drip syndrome (PNDS), which is caused by variety of rhinosinus conditions; (2) asthma; (3) GERD; (4) chronic bronchitis from cigarette smoking or other inhaled environmental irritants; (5) nonasthmatic eosinophilic bronchitis; and (6) bronchiectasis.   These conditions, singly or in combination, have accounted for up to 94% of the causes of chronic cough in prospective studies.   Other conditions have constituted no >6% of the causes in prospective studies These have included bronchogenic carcinoma, chronic interstitial pneumonia, sarcoidosis, left ventricular failure, ACEI-induced cough, and aspiration from a condition associated with pharyngeal dysfunction.   This is most likely  Classic Upper airway cough syndrome, so named because it's frequently impossible to sort out how much is  CR/sinusitis with freq throat clearing (which can be related to primary GERD)   vs  causing  secondary (" extra esophageal")  GERD from wide swings in gastric pressure that occur with throat clearing, often  promoting self use of mint and menthol lozenges that reduce the lower esophageal sphincter tone and exacerbate the problem further in a cyclical fashion.   These are the same pts (now being labeled as having "irritable larynx syndrome" by some cough centers) who not infrequently have a history of having failed to tolerate ace inhibitors,  dry powder inhalers with increased irritability from ET Tubes or biphosphonates or report having atypical reflux symptoms that don't respond to standard doses of PPI , and are easily confused as having aecopd or asthma flares by even experienced allergists/ pulmonologists.   For now rec off dpi's (asthmanex) and max gerd rx with next step sinus CT

## 2012-03-27 NOTE — Patient Instructions (Signed)
Nexium 40 mg Take 30-60 min before first meal of the day and Pepcid 20 mg one at bedtime until return  mucinex dm for cough 2 every 12 hours  GERD (REFLUX)  is an extremely common cause of respiratory symptoms, many times with no significant heartburn at all.    It can be treated with medication, but also with lifestyle changes including avoidance of late meals, excessive alcohol, smoking cessation, and avoid fatty foods, chocolate, peppermint, colas, red wine, and acidic juices such as orange juice.  NO MINT OR MENTHOL PRODUCTS SO NO COUGH DROPS  USE SUGARLESS CANDY INSTEAD (jolley ranchers or Stover's)  NO OIL BASED VITAMINS - use powdered substitutes.   Prednisone 10 mg take  4 each am x 2 days,   2 each am x 2 days,  1 each am x2days and stop   Stop asthmanex and continue pulmicort with albuterol twice daily   Please schedule a follow up office visit in 4 weeks, sooner if needed

## 2012-03-27 NOTE — Progress Notes (Signed)
  Subjective:    Patient ID: Caitlin [name], female    DOB: 07/03/1946   MRN: [id number]  HPI  63 yowf never smoker asthma as outgrew it about age teens then some seasonal sinus problems since the 1990s  but around 2007 developed recurrent pattern of breathing problems esp in spring dx as asthma and asthmanex and singulair seemed to work ok until around 2012 when had her 1st hernia surgery and since then symptoms persistent year round so 03/27/2012 referred to pulmonary clinic   03/27/2012 1st pulmonary ov / [name] cc > one year daily p stirs in am (but doesn't wake her) cc sense of  congestion/variable pattern prod of thick sputum/ variable color better p neb with alb/pulmicort.  Sinus spring > fall and not an active problem. Never rx with prednisone. No gagging, some urination.  All started p Fev 2012 p lap chole and several other procedures and coughs so hard it's causing a problem with the wound healing resulting in worsening hernia and has been told she will need more surgery.  Sleeping ok without nocturnal  or early am exacerbation  of respiratory  c/o's or need for noct saba. Also denies any obvious fluctuation of symptoms with weather or environmental changes or other aggravating or alleviating factors except as outlined above    Review of Systems  Constitutional: Negative for fever, chills, diaphoresis, activity change, appetite change, fatigue and unexpected weight change.  HENT: Positive for congestion. Negative for nosebleeds, sore throat, rhinorrhea, sneezing, mouth sores, trouble swallowing, dental problem, voice change, postnasal drip, sinus pressure and tinnitus.   Eyes: Negative for visual disturbance.  Respiratory: Positive for cough and wheezing. Negative for choking, chest tightness and shortness of breath.   Cardiovascular: Negative for chest pain, palpitations and leg swelling.  Gastrointestinal: Negative for nausea, vomiting, abdominal pain, constipation and blood in stool.    Genitourinary: Negative for difficulty urinating.  Musculoskeletal: Negative for myalgias, back pain, joint swelling, arthralgias and gait problem.  Skin: Negative for rash.  Neurological: Negative for dizziness, tremors, weakness, light-headedness, numbness and headaches.  Hematological: Does not bruise/bleed easily.  Psychiatric/Behavioral: Negative for confusion, disturbed wake/sleep cycle and agitation. The patient is not nervous/anxious.        Objective:   Physical Exam amb wf mod hoarse with upper airway cough and prominent upper airway wheeze Wt Readings from Last 3 Encounters:  03/27/12 249 lb 9.6 oz (113.218 kg)    HEENT: nl dentition, turbinates, and orophanx. Nl external ear canals without cough reflex   NECK :  without JVD/Nodes/TM/ nl carotid upstrokes bilaterally   LUNGS: no acc muscle use, clear to A and P bilaterally without cough on insp or exp maneuvers   CV:  RRR  no s3 or murmur or increase in P2, no edema   ABD:  soft and nontender with nl excursion in the supine position. No bruits or organomegaly, bowel sounds nl  MS:  warm without deformities, calf tenderness, cyanosis or clubbing  SKIN: warm and dry without lesions    NEURO:  alert, approp, no deficits    cxr 03/14/12 ? RML atx      Assessment & Plan:

## 2012-03-27 NOTE — Assessment & Plan Note (Signed)
Doubt this is directly related to the cough but it could be >  Rec rx augmentin x 10 days if purulent sputum then do repeat cxr and sinus ct scan next steps

## 2012-04-24 ENCOUNTER — Ambulatory Visit (INDEPENDENT_AMBULATORY_CARE_PROVIDER_SITE_OTHER)
Admission: RE | Admit: 2012-04-24 | Discharge: 2012-04-24 | Disposition: A | Discharge status: Home / Self Care | Payer: BC Managed Care – PPO | Source: Ambulatory Visit | Attending: Internal Medicine | Admitting: Internal Medicine

## 2012-04-24 ENCOUNTER — Ambulatory Visit (INDEPENDENT_AMBULATORY_CARE_PROVIDER_SITE_OTHER): Payer: BC Managed Care – PPO | Admitting: Internal Medicine

## 2012-04-24 ENCOUNTER — Encounter: Payer: Self-pay | Admitting: Internal Medicine

## 2012-04-24 VITALS — BP 122/72 | HR 74 | Temp 98.2°F | Ht 65.0 in | Wt 245.6 lb

## 2012-04-24 DIAGNOSIS — J9819 Other pulmonary collapse: Secondary | ICD-10-CM

## 2012-04-24 DIAGNOSIS — R05 Cough: Secondary | ICD-10-CM

## 2012-04-24 DIAGNOSIS — Z23 Encounter for immunization: Secondary | ICD-10-CM

## 2012-04-24 MED ORDER — BUDESONIDE-FORMOTEROL FUMARATE 160-4.5 MCG/ACT IN AERO: INHALATION_SPRAY | RESPIRATORY_TRACT | Status: DC

## 2012-04-24 NOTE — Assessment & Plan Note (Signed)
CXR  04/24/2012 :  No active disease.

## 2012-04-24 NOTE — Assessment & Plan Note (Signed)
Response to rx strongly supports  Classic Upper airway cough syndrome, so named because it's frequently impossible to sort out how much is  CR/sinusitis with freq throat clearing (which can be related to primary GERD)   vs  causing  secondary (" extra esophageal")  GERD from wide swings in gastric pressure that occur with throat clearing, often  promoting self use of mint and menthol lozenges that reduce the lower esophageal sphincter tone and exacerbate the problem further in a cyclical fashion.   These are the same pts (now being labeled as having "irritable larynx syndrome" by some cough centers) who not infrequently have a history of having failed to tolerate ace inhibitors,  dry powder inhalers or biphosphonates or report having atypical reflux symptoms that don't respond to standard doses of PPI , and are easily confused as having aecopd or asthma flares by even experienced allergists/ pulmonologists.  Try wean neb pulmicort/alb next > can resume if symptoms recur on aggressive gerd rx

## 2012-04-24 NOTE — Progress Notes (Signed)
Quick Note:  Spoke with pt and notified of results per Dr. Wert. Pt verbalized understanding and denied any questions.  ______ 

## 2012-04-24 NOTE — Patient Instructions (Addendum)
Only use mucinex dm as needed for cough to see if the cough is gone to your satisfaction  Then if doing fine, it's ok to Change nebulizer to 1st thing in am only to see how you do for a week or two and if doing great then just use nebulizer every 12 hours as needed but always take them together (equivalent of symbicort 160 2 puffs which can also be used every 12 hours if you need especially if you're not at home when you figure out you need it!)    If you are satisfied with your treatment plan let your doctor know and he/she can either refill your medications or you can return here when your prescription runs out.     If in any way you are not 100% satisfied,  please tell us.  If 100% better, tell your friends!

## 2012-04-24 NOTE — Progress Notes (Signed)
  Subjective:    Patient ID: Caitlin [name], female    DOB: 04-16-1946   MRN: [id number]  HPI  46 yowf never smoker asthma as outgrew it about age teens then some seasonal sinus problems since the 1990s  but around 2007 developed recurrent pattern of breathing problems esp in spring dx as asthma and asthmanex and singulair seemed to work ok until around 2012 when had her 1st hernia surgery and since then symptoms persistent year round so 03/27/2012 referred to pulmonary clinic   03/27/2012 1st pulmonary ov / [name] cc > one year daily p stirs in am (but doesn't wake her) cc sense of  congestion/variable pattern prod of thick sputum/ variable color better p neb with alb/pulmicort.  Sinus spring > fall and not an active problem. Never rx with prednisone. No gagging, some urination.  All started p Fev 2012 p lap chole and several other procedures and coughs so hard it's causing a problem with the wound healing resulting in worsening hernia and has been told she will need more surgery. rec Nexium 40 mg Take 30-60 min before first meal of the day and Pepcid 20 mg one at bedtime until return mucinex dm for cough 2 every 12 hours GERD   Prednisone 10 mg take  4 each am x 2 days,   2 each am x 2 days,  1 each am x2days and stop  Stop asthmanex and continue pulmicort with albuterol twice daily     04/24/2012 f/u ov/[name] cc all smiles, much better, no cough or sob.  No obvious daytime variabilty or assoc   cp or chest tightness, subjective wheeze overt sinus or hb symptoms. No unusual exp hx     Sleeping ok without nocturnal  or early am exacerbation  of respiratory  c/o's or need for noct saba. Also denies any obvious fluctuation of symptoms with weather or environmental changes or other aggravating or alleviating factors except as outlined above   ROS  The following are not active complaints unless bolded sore throat, dysphagia, dental problems, itching, sneezing,  nasal congestion or excess/ purulent  secretions, ear ache,   fever, chills, sweats, unintended wt loss, pleuritic or exertional cp, hemoptysis,  orthopnea pnd or leg swelling, presyncope, palpitations, heartburn, abdominal pain, anorexia, nausea, vomiting, diarrhea  or change in bowel or urinary habits, change in stools or urine, dysuria,hematuria,  rash, arthralgias, visual complaints, headache, numbness weakness or ataxia or problems with walking or coordination,  change in mood/affect or memory.            Objective:   Physical Exam amb wf mod hoarse with upper airway cough and prominent upper airway wheeze 04/24/2012 wt  245  Wt Readings from Last 3 Encounters:  03/27/12 249 lb 9.6 oz (113.218 kg)    HEENT: nl dentition, turbinates, and orophanx. Nl external ear canals without cough reflex   NECK :  without JVD/Nodes/TM/ nl carotid upstrokes bilaterally   LUNGS: no acc muscle use, clear to A and P bilaterally without cough on insp or exp maneuvers   CV:  RRR  no s3 or murmur or increase in P2, no edema   ABD:  soft and nontender with nl excursion in the supine position. No bruits or organomegaly, bowel sounds nl  MS:  warm without deformities, calf tenderness, cyanosis or clubbing       CXR  04/24/2012 :  No active disease.        Assessment & Plan:

## 2012-08-14 ENCOUNTER — Other Ambulatory Visit (HOSPITAL_COMMUNITY): Payer: Self-pay | Admitting: Family Medicine

## 2012-08-14 DIAGNOSIS — Z139 Encounter for screening, unspecified: Secondary | ICD-10-CM

## 2012-08-20 ENCOUNTER — Ambulatory Visit (HOSPITAL_COMMUNITY): Payer: BC Managed Care – PPO

## 2012-09-20 ENCOUNTER — Ambulatory Visit (HOSPITAL_COMMUNITY)
Admission: RE | Admit: 2012-09-20 | Discharge: 2012-09-20 | Disposition: A | Discharge status: Home / Self Care | Payer: BC Managed Care – PPO | Source: Ambulatory Visit | Attending: Family Medicine | Admitting: Family Medicine

## 2012-09-20 DIAGNOSIS — Z1231 Encounter for screening mammogram for malignant neoplasm of breast: Secondary | ICD-10-CM | POA: Insufficient documentation

## 2012-09-20 DIAGNOSIS — Z139 Encounter for screening, unspecified: Secondary | ICD-10-CM

## 2012-12-03 ENCOUNTER — Telehealth: Payer: Self-pay | Admitting: Internal Medicine

## 2012-12-03 NOTE — Telephone Encounter (Signed)
Ok to [employer] but be sure she brings all meds inhalers otcs etc with her

## 2012-12-03 NOTE — Telephone Encounter (Signed)
Pt had to r/s appt. Nothing further was needed

## 2012-12-03 NOTE — Telephone Encounter (Signed)
i spoke with pt. She is wanting to get her asthma and cough under control before she starts the process for gastric sleeve. The seminar begins 12/12/12. Please advis eif okay to overbook MW thanks

## 2012-12-03 NOTE — Telephone Encounter (Signed)
appt scheduled nothing further needed

## 2012-12-11 ENCOUNTER — Ambulatory Visit (INDEPENDENT_AMBULATORY_CARE_PROVIDER_SITE_OTHER): Payer: BC Managed Care – PPO | Admitting: Internal Medicine

## 2012-12-11 ENCOUNTER — Encounter: Payer: Self-pay | Admitting: Internal Medicine

## 2012-12-11 VITALS — BP 138/82 | HR 87 | Temp 98.3°F | Ht 65.5 in | Wt 243.0 lb

## 2012-12-11 DIAGNOSIS — J45909 Unspecified asthma, uncomplicated: Secondary | ICD-10-CM

## 2012-12-11 MED ORDER — PREDNISONE (PAK) 10 MG PO TABS: ORAL_TABLET | ORAL | Status: DC

## 2012-12-11 MED ORDER — AMOXICILLIN-POT CLAVULANATE 875-125 MG PO TABS: 1.0000 | ORAL_TABLET | Freq: Two times a day (BID) | ORAL | Status: DC

## 2012-12-11 MED ORDER — BUDESONIDE-FORMOTEROL FUMARATE 160-4.5 MCG/ACT IN AERO: INHALATION_SPRAY | RESPIRATORY_TRACT | Status: DC

## 2012-12-11 NOTE — Patient Instructions (Addendum)
Plan A = automatic = Symbicort 160 Take 2 puffs first thing in am and then another 2 puffs about 12 hours later.   Plan B = backup Only use your albuterol as a rescue medication to be used if you can't catch your breath by resting or doing a relaxed purse lip breathing pattern. The less you use it, the better it will work when you need it. Ok to use up to every 4 hours.  For cough > mucinex dm 1200 every 12 hours as needed    Short Term meds Prednisone 10 mg take  4 each am x 2 days,   2 each am x 2 days,  1 each am x2days and stop  Augmentin twice daily x 10days  GERD (REFLUX)  is an extremely common cause of respiratory symptoms, many times with no significant heartburn at all.    It can be treated with medication, but also with lifestyle changes including avoidance of late meals, excessive alcohol, smoking cessation, and avoid fatty foods, chocolate, peppermint, colas, red wine, and acidic juices such as orange juice.  NO MINT OR MENTHOL PRODUCTS SO NO COUGH DROPS  USE SUGARLESS CANDY INSTEAD (jolley ranchers or Stover's)  NO OIL BASED VITAMINS - use powdered substitutes.    Please schedule a follow up office visit in 2 weeks, sooner if needed

## 2012-12-11 NOTE — Progress Notes (Signed)
Subjective:    Patient ID: Caitlin [name], female    DOB: 1946/08/01   MRN: [id number]  HPI  42 yowf never smoker asthma as child outgrew it about age teens then some seasonal sinus problems since the 1990s  but around 2007 developed recurrent pattern of breathing problems esp in spring dx as asthma > asthmanex and singulair seemed to work ok until around 2012 when had her 1st hernia surgery and since then symptoms persistent year round so 03/27/2012 referred to pulmonary clinic   03/27/2012 1st pulmonary ov / Caitlin [name] cc > one year daily p stirs in am (but doesn't wake her) cc sense of  congestion/variable pattern prod of thick sputum/ variable color better p neb with alb/pulmicort.  Sinus spring > fall and not an active problem. Never rx with prednisone. No gagging, some urination.  All started p Feb 2012 p lap chole and several other procedures and coughs so hard it's causing a problem with the wound healing resulting in worsening hernia and has been told she will need more surgery. rec Nexium 40 mg Take 30-60 min before first meal of the day and Pepcid 20 mg one at bedtime until return mucinex dm for cough 2 every 12 hours GERD   Prednisone 10 mg take  4 each am x 2 days,   2 each am x 2 days,  1 each am x2days and stop  Stop asthmanex and continue pulmicort with albuterol twice daily     04/24/2012 f/u ov/Caitlin [name] cc all smiles, much better, no cough or sob. rec Only use mucinex dm as needed for cough to see if the cough is gone to your satisfaction Then if doing fine, it's ok to Change nebulizer to 1st thing in am only to see how you do for a week or two and if doing great then just use nebulizer every 12 hours as needed but always take them together (equivalent of symbicort 160 2 puffs which can also be used every 12 hours if you need especially if you're not at home when you figure out you need it!)   12/11/2012 f/u ov/Caitlin [name] re recurrent cough Chief Complaint  Patient presents with  . Follow-up     Cough x 3 months- prod with minimal green sputum. No specific trigger she can find for the cough.   only using one half mucinex in am and, using neb twice daily with alb and budesonide , no sob. In retrtrospect never really 100% better and thinks symbicort worked better than anything else   No obvious daytime variabilty or assoc   cp or chest tightness, subjective wheeze overt sinus or hb symptoms. No unusual exp hx     Sleeping ok without nocturnal  or early am exacerbation  of respiratory  c/o's or need for noct saba. Also denies any obvious fluctuation of symptoms with weather or environmental changes or other aggravating or alleviating factors except as outlined above      Current Medications, Allergies, Past Medical History, Past Surgical History, Family History, and Social History were reviewed in [employer] record.  ROS  The following are not active complaints unless bolded sore throat, dysphagia, dental problems, itching, sneezing,  nasal congestion or excess/ purulent secretions, ear ache,   fever, chills, sweats, unintended wt loss, pleuritic or exertional cp, hemoptysis,  orthopnea pnd or leg swelling, presyncope, palpitations, heartburn, abdominal pain, anorexia, nausea, vomiting, diarrhea  or change in bowel or urinary habits, change in stools or urine, dysuria,hematuria,  rash, arthralgias, visual complaints, headache, numbness weakness or ataxia or problems with walking or coordination,  change in mood/affect or memory.                Objective:   Physical Exam amb wf nad with nl vital signs 04/24/2012 wt  245 > 12/11/2012  243 Wt Readings from Last 3 Encounters:  03/27/12 249 lb 9.6 oz (113.218 kg)    HEENT: nl dentition, turbinates, and orophanx. Nl external ear canals without cough reflex   NECK :  without JVD/Nodes/TM/ nl carotid upstrokes bilaterally   LUNGS: no acc muscle use, pan exp rhonchi bilaterally    CV:  RRR  no s3 or murmur  or increase in P2, no edema   ABD:  soft and nontender with nl excursion in the supine position. No bruits or organomegaly, bowel sounds nl  MS:  warm without deformities, calf tenderness, cyanosis or clubbing       CXR  04/24/2012 :  No active disease.        Assessment & Plan:

## 2012-12-12 DIAGNOSIS — J45909 Unspecified asthma, uncomplicated: Secondary | ICD-10-CM | POA: Insufficient documentation

## 2012-12-12 NOTE — Assessment & Plan Note (Signed)
DDX of  difficult airways managment all start with A and  include Adherence, Ace Inhibitors, Acid Reflux, Active Sinus Disease, Alpha 1 Antitripsin deficiency, Anxiety masquerading as Airways dz,  ABPA,  allergy(esp in young), Aspiration (esp in elderly), Adverse effects of DPI,  Active smokers, plus two Bs  = Bronchiectasis and Beta blocker use..and one C= CHF  Adherence is always the initial "prime suspect" and is a multilayered concern that requires a "trust but verify" approach in every patient - starting with knowing how to use medications, especially inhalers, correctly, keeping up with refills and understanding the fundamental difference between maintenance and prns vs those medications only taken for a very short course and then stopped and not refilled. The proper method of use, as well as anticipated side effects, of a metered-dose inhaler are discussed and demonstrated to the patient. Improved effectiveness after extensive coaching during this visit to a level of approximately  75% so try symbicort 160 2 bid x 2 weeks  ? Active sinus dz > augmentin then sinus ct if not cleared  ? Acid reflux > reviewed diet/ meds  See instructions for specific recommendations which were reviewed directly with the patient who was given a copy with highlighter outlining the key components.

## 2012-12-14 ENCOUNTER — Ambulatory Visit: Payer: BC Managed Care – PPO | Admitting: Internal Medicine

## 2012-12-25 ENCOUNTER — Encounter: Payer: Self-pay | Admitting: Adult Health

## 2012-12-25 ENCOUNTER — Ambulatory Visit (INDEPENDENT_AMBULATORY_CARE_PROVIDER_SITE_OTHER): Payer: BC Managed Care – PPO | Admitting: Adult Health

## 2012-12-25 VITALS — BP 118/68 | HR 81 | Temp 98.5°F | Ht 63.25 in | Wt 236.8 lb

## 2012-12-25 DIAGNOSIS — J45909 Unspecified asthma, uncomplicated: Secondary | ICD-10-CM

## 2012-12-25 NOTE — Assessment & Plan Note (Addendum)
Recent flare with URI -now resolving  ? PFT done in past   Plan  Continue on Symbicort 2 puffs Twice daily  , rinse after use.  Use Mucinex DM Twice daily  As needed  Cough/congestion  Follow up Dr. [name]  In 4-6 weeks and As needed

## 2012-12-25 NOTE — Progress Notes (Signed)
Subjective:    Patient ID: Caitlin [name], female    DOB: 08/20/1945   MRN: [id number] HPI  55 yowf never smoker asthma as child outgrew it about age teens then some seasonal sinus problems since the 1990s  but around 2007 developed recurrent pattern of breathing problems esp in spring dx as asthma > asthmanex and singulair seemed to work ok until around 2012 when had her 1st hernia surgery and since then symptoms persistent year round so 03/27/2012 referred to pulmonary clinic   03/27/2012 1st pulmonary ov / [name] cc > one year daily p stirs in am (but doesn't wake her) cc sense of  congestion/variable pattern prod of thick sputum/ variable color better p neb with alb/pulmicort.  Sinus spring > fall and not an active problem. Never rx with prednisone. No gagging, some urination.  All started p Feb 2012 p lap chole and several other procedures and coughs so hard it's causing a problem with the wound healing resulting in worsening hernia and has been told she will need more surgery. rec Nexium 40 mg Take 30-60 min before first meal of the day and Pepcid 20 mg one at bedtime until return mucinex dm for cough 2 every 12 hours GERD   Prednisone 10 mg take  4 each am x 2 days,   2 each am x 2 days,  1 each am x2days and stop  Stop asthmanex and continue pulmicort with albuterol twice daily     04/24/2012 f/u ov/Wert cc all smiles, much better, no cough or sob. rec Only use mucinex dm as needed for cough to see if the cough is gone to your satisfaction Then if doing fine, it's ok to Change nebulizer to 1st thing in am only to see how you do for a week or two and if doing great then just use nebulizer every 12 hours as needed but always take them together (equivalent of symbicort 160 2 puffs which can also be used every 12 hours if you need especially if you're not at home when you figure out you need it!)   12/11/2012 f/u ov/Wert re recurrent cough Chief Complaint  Patient presents with  . Follow-up     Cough x 3 months- prod with minimal green sputum. No specific trigger she can find for the cough.   only using one half mucinex in am and, using neb twice daily with alb and budesonide , no sob. In retrtrospect never really 100% better and thinks symbicort worked better than anything else >augmentin  And steroid pack   12/25/2012 Follow up  Pt returns for follow up for recent asthma flare with URI  Tx w/ augmentin and steroid taper.  Reports cough is 80% improved, Still producing some milky-colored mucus.  denies wheezing, chest tightness Cough is much better. Feels improved.  No discolored mucus. No fever or hemoptysis  No SABA use. Using mucinex DM only for cough .  Has finished abx and steroids without known difficulty.       Current Medications, Allergies, Past Medical History, Past Surgical History, Family History, and Social History were reviewed in [employer] record.  ROS  The following are not active complaints unless bolded sore throat, dysphagia, dental problems, itching, sneezing,  nasal congestion or excess/ purulent secretions, ear ache,   fever, chills, sweats, unintended wt loss, pleuritic or exertional cp, hemoptysis,  orthopnea pnd or leg swelling, presyncope, palpitations, heartburn, abdominal pain, anorexia, nausea, vomiting, diarrhea  or change in bowel or  urinary habits, change in stools or urine, dysuria,hematuria,  rash, arthralgias, visual complaints, headache, numbness weakness or ataxia or problems with walking or coordination,  change in mood/affect or memory.                Objective:   Physical Exam amb wf nad with nl vital signs 04/24/2012 wt  245 > 12/11/2012  243>236 12/25/2012   HEENT: nl dentition, turbinates, and orophanx. Nl external ear canals without cough reflex   NECK :  without JVD/Nodes/TM/ nl carotid upstrokes bilaterally   LUNGS: no acc muscle use, CTA bilaterally    CV:  RRR  no s3 or murmur or increase in  P2, no edema   ABD:  soft and nontender with nl excursion in the supine position. No bruits or organomegaly, bowel sounds nl  MS:  warm without deformities, calf tenderness, cyanosis or clubbing       CXR  04/24/2012 :  No active disease.        Assessment & Plan:

## 2012-12-25 NOTE — Patient Instructions (Addendum)
Continue on Symbicort 2 puffs Twice daily  , rinse after use.  Use Mucinex DM Twice daily  As needed  Cough/congestion  Follow up Dr. [name]  In 4-6 weeks and As needed

## 2013-02-04 ENCOUNTER — Ambulatory Visit: Payer: BC Managed Care – PPO | Admitting: Internal Medicine

## 2013-02-13 ENCOUNTER — Ambulatory Visit: Payer: BC Managed Care – PPO | Admitting: Internal Medicine

## 2013-03-05 ENCOUNTER — Encounter: Payer: Self-pay | Admitting: Internal Medicine

## 2013-03-05 ENCOUNTER — Ambulatory Visit (INDEPENDENT_AMBULATORY_CARE_PROVIDER_SITE_OTHER): Payer: BC Managed Care – PPO | Admitting: Internal Medicine

## 2013-03-05 VITALS — BP 126/74 | HR 79 | Temp 98.2°F | Ht 64.0 in | Wt 225.8 lb

## 2013-03-05 DIAGNOSIS — J45909 Unspecified asthma, uncomplicated: Secondary | ICD-10-CM

## 2013-03-05 NOTE — Patient Instructions (Addendum)
Ok to stop singulair  Plan A = automatic = Symbicort 160 Take 1 puffs first thing in am and then another 2 puffs about 12 hours later - if doing great off singulair in a couple of weeks then ok to stop the symbicort too - it will start working in 5 min if needed   Plan B = backup Only use your albuterol as a rescue medication to be used if you can't catch your breath by resting or doing a relaxed purse lip breathing pattern. The less you use it, the better it will work when you need it. Ok to use up to every 4 hours.  For cough > mucinex dm 1200 every 12 hours as needed    For cough/ congestion > mucinex as needed    If you are satisfied with your treatment plan let your doctor know and he/she can either refill your medications or you can return here when your prescription runs out.     If in any way you are not 100% satisfied,  please tell us.  If 100% better, tell your friends!

## 2013-03-05 NOTE — Progress Notes (Signed)
Subjective:    Patient ID: Caitlin [name], female    DOB: 07/02/1946   MRN: [id number]   Brief patient profile:  44 yowf never smoker asthma as child outgrew it about age teens then some seasonal sinus problems since the 1990s  but around 2007 developed recurrent pattern of breathing problems esp in spring dx as asthma > asthmanex and singulair seemed to work ok until around 2012 when had her 1st hernia surgery and since then symptoms persistent year round so 03/27/2012 referred to pulmonary clinic  HPI 03/27/2012 1st pulmonary ov / [name] cc > one year daily p stirs in am (but doesn't wake her) cc sense of  congestion/variable pattern prod of thick sputum/ variable color better p neb with alb/pulmicort.  Sinus spring > fall and not an active problem. Never rx with prednisone. No gagging, some urination.  All started p Feb 2012 p lap chole and several other procedures and coughs so hard it's causing a problem with the wound healing resulting in worsening hernia and has been told she will need more surgery. rec Nexium 40 mg Take 30-60 min before first meal of the day and Pepcid 20 mg one at bedtime until return mucinex dm for cough 2 every 12 hours GERD   Prednisone 10 mg take  4 each am x 2 days,   2 each am x 2 days,  1 each am x2days and stop  Stop asthmanex and continue pulmicort with albuterol twice daily     04/24/2012 f/u ov/Wert cc all smiles, much better, no cough or sob. rec Only use mucinex dm as needed for cough to see if the cough is gone to your satisfaction Then if doing fine, it's ok to Change nebulizer to 1st thing in am only to see how you do for a week or two and if doing great then just use nebulizer every 12 hours as needed but always take them together (equivalent of symbicort 160 2 puffs which can also be used every 12 hours if you need especially if you're not at home when you figure out you need it!)   12/11/2012 f/u ov/Wert re recurrent cough Chief Complaint  Patient  presents with  . Follow-up    Cough x 3 months- prod with minimal green sputum. No specific trigger she can find for the cough.   only using one half mucinex in am and, using neb twice daily with alb and budesonide , no sob. In retrtrospect never really 100% better and thinks symbicort worked better than anything else >augmentin  And steroid pack   12/25/2012 Follow up  Pt returns for follow up for recent asthma flare with URI  Tx w/ augmentin and steroid taper.  Reports cough is 80% improved, Still producing some milky-colored mucus.  denies wheezing, chest tightness Cough is much better. Feels improved.  No discolored mucus. No fever or hemoptysis  No SABA use. Using mucinex DM only for cough .  Has finished abx and steroids without known difficulty.  rec Continue on Symbicort 2 puffs Twice daily  , rinse after use.    03/05/2013 f/u ov/Wert re asthma Chief Complaint  Patient presents with  . Follow-up    Breathing has improved.  No SOB, wheezing, chest tightenss, chest pain, or cough noted at this time.  On  symbicort 160 one twice daily also singulair and never nebulizer any more  No obvious daytime variabilty or assoc chronic cough or cp or chest tightness, subjective wheeze overt sinus or  hb symptoms. No unusual exp hx or h/o childhood pna/ asthma or knowledge of premature birth.   Sleeping ok without nocturnal  or early am exacerbation  of respiratory  c/o's or need for noct saba. Also denies any obvious fluctuation of symptoms with weather or environmental changes or other aggravating or alleviating factors except as outlined above    Current Medications, Allergies, Past Medical History, Past Surgical History, Family History, and Social History were reviewed in [employer] record.  ROS  The following are not active complaints unless bolded sore throat, dysphagia, dental problems, itching, sneezing,  nasal congestion or excess/ purulent secretions, ear  ache,   fever, chills, sweats, unintended wt loss, pleuritic or exertional cp, hemoptysis,  orthopnea pnd or leg swelling, presyncope, palpitations, heartburn, abdominal pain, anorexia, nausea, vomiting, diarrhea  or change in bowel or urinary habits, change in stools or urine, dysuria,hematuria,  rash, arthralgias, visual complaints, headache, numbness weakness or ataxia or problems with walking or coordination,  change in mood/affect or memory.                Objective:   Physical Exam  amb wf nad with nl vital signs  04/24/2012 wt  245 > 12/11/2012  243>236 12/25/2012  > 226 03/05/2013    HEENT: nl dentition, turbinates, and orophanx. Nl external ear canals without cough reflex   NECK :  without JVD/Nodes/TM/ nl carotid upstrokes bilaterally   LUNGS: no acc muscle use, CTA bilaterally    CV:  RRR  no s3 or murmur or increase in P2, no edema   ABD:  soft and nontender with nl excursion in the supine position. No bruits or organomegaly, bowel sounds nl  MS:  warm without deformities, calf tenderness, cyanosis or clubbing       CXR  04/24/2012 :  No active disease.        Assessment & Plan:   Outpatient Encounter Prescriptions as of 03/05/2013  Medication Sig Dispense Refill  . albuterol (PROVENTIL) (2.5 MG/3ML) 0.083% nebulizer solution Take 2.5 mg by nebulization every 6 (six) hours as needed.      . budesonide-formoterol (SYMBICORT) 160-4.5 MCG/ACT inhaler Take 1 puffs first thing in am and then another 1 puffs about 12 hours later      . dextromethorphan-guaiFENesin (MUCINEX DM) 30-600 MG per 12 hr tablet Take 1 tablet by mouth at bedtime.       . diphenhydramine-acetaminophen (TYLENOL PM) 25-500 MG TABS Take 1 tablet by mouth at bedtime as needed.      [name] esomeprazole (NEXIUM) 40 MG packet Take 40 mg by mouth daily before breakfast.  30 each  12  . famotidine (PEPCID) 20 MG tablet Take 20 mg by mouth at bedtime.      [name] MILK THISTLE EXTRACT PO Take 1 tablet by mouth.  1000mg       . Multiple Vitamin (MULTIVITAMIN) capsule Take 1 capsule by mouth daily.      . rosuvastatin (CRESTOR) 20 MG tablet Take 20 mg by mouth daily.      [name] telmisartan-hydrochlorothiazide (MICARDIS HCT) 80-25 MG per tablet Take 0.5 tablets by mouth daily.       [name] zolpidem (AMBIEN) 10 MG tablet Take 10 mg by mouth at bedtime as needed.      . [DISCONTINUED] budesonide-formoterol (SYMBICORT) 160-4.5 MCG/ACT inhaler Take 2 puffs first thing in am and then another 2 puffs about 12 hours later      . [DISCONTINUED] montelukast (SINGULAIR) 10 MG tablet Take  10 mg by mouth at bedtime.      . [DISCONTINUED] oxyCODONE-acetaminophen (PERCOCET/ROXICET) 5-325 MG per tablet Take 1 tablet by mouth every 4 (four) hours as needed for pain.       No facility-administered encounter medications on file as of 03/05/2013.

## 2013-03-06 NOTE — Assessment & Plan Note (Addendum)
I had an extended summary  discussion with the patient today lasting 15 to 20 minutes of a 25 minute visit on the following issues:   All goals of chronic asthma control met including optimal function and elimination of symptoms with minimal need for rescue therapy.  Contingencies discussed in full including contacting this office immediately if not controlling the symptoms using the rule of two's.   See instructions for specific recommendations which were reviewed directly with the patient who was given a copy with highlighter outlining the key components.

## 2013-04-02 DIAGNOSIS — Z0189 Encounter for other specified special examinations: Secondary | ICD-10-CM | POA: Diagnosis not present

## 2013-04-02 DIAGNOSIS — E669 Obesity, unspecified: Secondary | ICD-10-CM | POA: Diagnosis not present

## 2013-04-17 DIAGNOSIS — E669 Obesity, unspecified: Secondary | ICD-10-CM | POA: Diagnosis not present

## 2013-04-30 DIAGNOSIS — E669 Obesity, unspecified: Secondary | ICD-10-CM | POA: Diagnosis not present

## 2013-04-30 DIAGNOSIS — M357 Hypermobility syndrome: Secondary | ICD-10-CM | POA: Diagnosis not present

## 2013-05-09 DIAGNOSIS — E669 Obesity, unspecified: Secondary | ICD-10-CM | POA: Diagnosis not present

## 2013-05-09 DIAGNOSIS — Z6838 Body mass index (BMI) 38.0-38.9, adult: Secondary | ICD-10-CM | POA: Diagnosis not present

## 2013-05-09 DIAGNOSIS — Z23 Encounter for immunization: Secondary | ICD-10-CM | POA: Diagnosis not present

## 2013-05-09 DIAGNOSIS — E785 Hyperlipidemia, unspecified: Secondary | ICD-10-CM | POA: Diagnosis not present

## 2013-05-09 DIAGNOSIS — I10 Essential (primary) hypertension: Secondary | ICD-10-CM | POA: Diagnosis not present

## 2013-05-16 DIAGNOSIS — Z6838 Body mass index (BMI) 38.0-38.9, adult: Secondary | ICD-10-CM | POA: Diagnosis not present

## 2013-05-16 DIAGNOSIS — Z713 Dietary counseling and surveillance: Secondary | ICD-10-CM | POA: Diagnosis not present

## 2013-05-16 DIAGNOSIS — E669 Obesity, unspecified: Secondary | ICD-10-CM | POA: Diagnosis not present

## 2013-06-04 ENCOUNTER — Encounter (INDEPENDENT_AMBULATORY_CARE_PROVIDER_SITE_OTHER): Payer: Self-pay | Admitting: *Deleted

## 2013-06-06 DIAGNOSIS — K432 Incisional hernia without obstruction or gangrene: Secondary | ICD-10-CM | POA: Diagnosis not present

## 2013-06-06 DIAGNOSIS — E669 Obesity, unspecified: Secondary | ICD-10-CM | POA: Diagnosis not present

## 2013-06-06 DIAGNOSIS — I10 Essential (primary) hypertension: Secondary | ICD-10-CM | POA: Diagnosis not present

## 2013-06-06 DIAGNOSIS — R7301 Impaired fasting glucose: Secondary | ICD-10-CM | POA: Diagnosis not present

## 2013-06-19 DIAGNOSIS — K432 Incisional hernia without obstruction or gangrene: Secondary | ICD-10-CM | POA: Diagnosis not present

## 2013-07-02 DIAGNOSIS — K432 Incisional hernia without obstruction or gangrene: Secondary | ICD-10-CM | POA: Diagnosis not present

## 2013-07-02 DIAGNOSIS — E669 Obesity, unspecified: Secondary | ICD-10-CM | POA: Diagnosis not present

## 2013-07-16 DIAGNOSIS — E669 Obesity, unspecified: Secondary | ICD-10-CM | POA: Diagnosis not present

## 2013-08-05 DIAGNOSIS — Z6839 Body mass index (BMI) 39.0-39.9, adult: Secondary | ICD-10-CM | POA: Diagnosis not present

## 2013-08-06 DIAGNOSIS — R635 Abnormal weight gain: Secondary | ICD-10-CM | POA: Diagnosis not present

## 2013-08-06 DIAGNOSIS — E669 Obesity, unspecified: Secondary | ICD-10-CM | POA: Diagnosis not present

## 2013-08-06 DIAGNOSIS — Z0189 Encounter for other specified special examinations: Secondary | ICD-10-CM | POA: Diagnosis not present

## 2013-08-08 DIAGNOSIS — E669 Obesity, unspecified: Secondary | ICD-10-CM | POA: Diagnosis not present

## 2013-08-08 DIAGNOSIS — K449 Diaphragmatic hernia without obstruction or gangrene: Secondary | ICD-10-CM | POA: Diagnosis not present

## 2013-08-08 DIAGNOSIS — K222 Esophageal obstruction: Secondary | ICD-10-CM | POA: Diagnosis not present

## 2013-08-08 DIAGNOSIS — K219 Gastro-esophageal reflux disease without esophagitis: Secondary | ICD-10-CM | POA: Diagnosis not present

## 2013-08-21 DIAGNOSIS — J343 Hypertrophy of nasal turbinates: Secondary | ICD-10-CM | POA: Diagnosis not present

## 2013-08-21 DIAGNOSIS — J0100 Acute maxillary sinusitis, unspecified: Secondary | ICD-10-CM | POA: Diagnosis not present

## 2013-08-21 DIAGNOSIS — J310 Chronic rhinitis: Secondary | ICD-10-CM | POA: Diagnosis not present

## 2013-08-26 ENCOUNTER — Other Ambulatory Visit (HOSPITAL_COMMUNITY): Payer: Self-pay | Admitting: Family Medicine

## 2013-08-26 DIAGNOSIS — Z139 Encounter for screening, unspecified: Secondary | ICD-10-CM

## 2013-08-29 DIAGNOSIS — Z0189 Encounter for other specified special examinations: Secondary | ICD-10-CM | POA: Diagnosis not present

## 2013-08-29 DIAGNOSIS — E669 Obesity, unspecified: Secondary | ICD-10-CM | POA: Diagnosis not present

## 2013-08-29 DIAGNOSIS — Z6831 Body mass index (BMI) 31.0-31.9, adult: Secondary | ICD-10-CM | POA: Diagnosis not present

## 2013-09-02 ENCOUNTER — Ambulatory Visit (INDEPENDENT_AMBULATORY_CARE_PROVIDER_SITE_OTHER): Payer: Medicare Other | Admitting: Internal Medicine

## 2013-09-02 ENCOUNTER — Encounter (INDEPENDENT_AMBULATORY_CARE_PROVIDER_SITE_OTHER): Payer: Self-pay | Admitting: Internal Medicine

## 2013-09-02 VITALS — BP 122/80 | HR 72 | Temp 98.1°F | Resp 18 | Ht 64.0 in | Wt 219.3 lb

## 2013-09-02 DIAGNOSIS — K432 Incisional hernia without obstruction or gangrene: Secondary | ICD-10-CM | POA: Insufficient documentation

## 2013-09-02 DIAGNOSIS — Z1212 Encounter for screening for malignant neoplasm of rectum: Principal | ICD-10-CM

## 2013-09-02 DIAGNOSIS — K219 Gastro-esophageal reflux disease without esophagitis: Secondary | ICD-10-CM | POA: Insufficient documentation

## 2013-09-02 DIAGNOSIS — Z1211 Encounter for screening for malignant neoplasm of colon: Secondary | ICD-10-CM | POA: Diagnosis not present

## 2013-09-02 DIAGNOSIS — E669 Obesity, unspecified: Secondary | ICD-10-CM | POA: Insufficient documentation

## 2013-09-02 NOTE — Patient Instructions (Signed)
Further recommendations to be made after prior procedure records reviewed

## 2013-09-02 NOTE — Progress Notes (Signed)
Presenting complaint;  Patient here to discuss screening colonoscopy and has questions about large recurrent ventral hernia.  Subjective:  Patient is 68 year old Caucasian female who is here for scheduled visit. She believes it is time for her to undergo screening colonoscopy. She is concerned if presence of large ventral hernia would be hindrance or may colonoscopy difficult. She denies rectal bleeding or melena. Her bowels move daily. She believes Nexium is working very well. She occasionally has cough a scratchy feeling in her throat but she denies dysphagia. She has developed large ventral hernia and has been evaluated by Dr. Demetrio Lapping of [facility]. She had repair of small and likely hernia at the time of laparoscopic cholecystectomy in February 2012. She developed incisional hernia and underwent repair with mesh in May 2012 by  Dr. Aviva Signs. Unfortunately this recurred and she had surgery by Dr. Glendell Docker. [place] in August 2012. Hernia recurred yet again. Patient was advised to lose weight and undergo bariatric surgery prior to having this hernia fixed again so that it would not recur. She states she has lost 32 pounds in the last 6 months since she's been going through program for weight loss and nutrition at [facility].  Current Medications: Current Outpatient Prescriptions  Medication Sig Dispense Refill  . budesonide-formoterol (SYMBICORT) 160-4.5 MCG/ACT inhaler Take 1 puffs first thing in am and then another 1 puffs about 12 hours later      . dextromethorphan-guaiFENesin (MUCINEX DM) 30-600 MG per 12 hr tablet Take 1 tablet by mouth at bedtime.       . diphenhydramine-acetaminophen (TYLENOL PM) 25-500 MG TABS Take 1 tablet by mouth at bedtime as needed.      [name] esomeprazole (NEXIUM) 40 MG capsule Take 40 mg by mouth daily at 12 noon.      . famotidine (PEPCID) 20 MG tablet Take 20 mg by mouth at bedtime.      [name] MILK THISTLE EXTRACT PO Take 1 tablet by mouth. 1000mg       . Multiple Vitamin  (MULTIVITAMIN) capsule Take 1 capsule by mouth daily.      . rosuvastatin (CRESTOR) 20 MG tablet Take 20 mg by mouth daily.      [name] telmisartan-hydrochlorothiazide (MICARDIS HCT) 80-25 MG per tablet Take 0.5 tablets by mouth daily.       [name] zolpidem (AMBIEN) 10 MG tablet Take 10 mg by mouth at bedtime as needed.      [name] esomeprazole (NEXIUM) 40 MG packet Take 40 mg by mouth daily before breakfast.  30 each  12   No current facility-administered medications for this visit.   Past medical history. Chronic GERD. Last EGD/ED was in April 2004 Hypertension. Hyperlipidemia. Bronchial asthma. Obesity. Tonsillectomy in 1968. Tubal ligation in early 80,s. Vaginal hysterectomy in 1980s. Laparoscopic cholecystectomy with umbilicus hernia repair in February 2012. Incisional herniorrhaphy with mesh in May 2012([facility]). Incisional herniorrhaphy with mesh in August 2012([facility]).  Allergies; Allergies  Allergen Reactions  . Tetanus Toxoids    Family history; Father lived to be 74. He had surgery for rectal carcinoma at age 33 also developed prostate cancer. Mother had dementia and died at 64. She has 3 sisters. 2 are in good health. One sister has some type of autoimmune disease.  Social history; She is married and retired Radio producer. She does not smoke cigarettes or drink alcohol.  Objective: Blood pressure 122/80, pulse 72, temperature 98.1 F (36.7 C), temperature source Oral, resp. rate 18, height 5\' 4"  (1.626 m), weight 219 lb 4.8 oz (99.474  kg). Patient is alert and in no acute distress. Conjunctiva is pink. Sclera is nonicteric Oropharyngeal mucosa is normal. No neck masses or thyromegaly noted. Cardiac exam with regular rhythm normal S1 and S2. No murmur or gallop noted. Lungs are clear to auscultation. Abdomen is asymmetrical with large ventral hernia to the left of midline below the level of umbilicus. This hernia is size of small watermelon. It is partially reducible and  nontender. .Noted at umbilicus. No organomegaly or masses.  No LE edema or clubbing noted.    Assessment:  #1. Large ventral hernia appears to be affecting quality of her life and will need to be fixed. I agree success rate would be much higher with weight loss. #2. She is average risk for CRC. Presence of ventral hernia is not contraindication to colonoscopy. Will review old records and determine if she should have one now. #.  GERD. She is doing well with therapy. .   Plan:  Review colonoscopy records before further recommendations made. Repair of ventral hernia as recommended by Dr. Demetrio Lapping of [facility].   Addendum; Last colonoscopy was on January 31, 2003. She had diverticula at transverse descending and sigmoid colon and small external hemorrhoids. Will arrange for patient to undergo screening colonoscopy.

## 2013-09-05 ENCOUNTER — Telehealth (INDEPENDENT_AMBULATORY_CARE_PROVIDER_SITE_OTHER): Payer: Self-pay | Admitting: *Deleted

## 2013-09-05 ENCOUNTER — Other Ambulatory Visit (INDEPENDENT_AMBULATORY_CARE_PROVIDER_SITE_OTHER): Payer: Self-pay | Admitting: *Deleted

## 2013-09-05 DIAGNOSIS — Z1211 Encounter for screening for malignant neoplasm of colon: Secondary | ICD-10-CM

## 2013-09-05 MED ORDER — PEG-KCL-NACL-NASULF-NA ASC-C 100 G PO SOLR
1.0000 | Freq: Once | ORAL | Status: DC
Start: 2013-09-05 — End: 2013-09-25

## 2013-09-05 NOTE — Telephone Encounter (Signed)
Ann- Dr.Rehman left a message ,asking that you arrange for a Colonoscopy for Caitlin [name]. Last Colonoscopy was 11 years ago per Dr.Rehman.

## 2013-09-05 NOTE — Telephone Encounter (Signed)
Patient needs movi prep 

## 2013-09-05 NOTE — Telephone Encounter (Signed)
TCS sch'd 09/25/13, patient aware

## 2013-09-11 DIAGNOSIS — Z8719 Personal history of other diseases of the digestive system: Secondary | ICD-10-CM | POA: Diagnosis not present

## 2013-09-11 DIAGNOSIS — I10 Essential (primary) hypertension: Secondary | ICD-10-CM | POA: Diagnosis not present

## 2013-09-11 DIAGNOSIS — R7301 Impaired fasting glucose: Secondary | ICD-10-CM | POA: Diagnosis not present

## 2013-09-11 DIAGNOSIS — K219 Gastro-esophageal reflux disease without esophagitis: Secondary | ICD-10-CM | POA: Diagnosis not present

## 2013-09-12 ENCOUNTER — Ambulatory Visit (INDEPENDENT_AMBULATORY_CARE_PROVIDER_SITE_OTHER): Payer: Medicare Other | Admitting: Otolaryngology

## 2013-09-12 DIAGNOSIS — J310 Chronic rhinitis: Secondary | ICD-10-CM | POA: Diagnosis not present

## 2013-09-12 DIAGNOSIS — J343 Hypertrophy of nasal turbinates: Secondary | ICD-10-CM

## 2013-09-13 ENCOUNTER — Encounter (HOSPITAL_COMMUNITY): Payer: Self-pay | Admitting: Pharmacy Technician

## 2013-09-23 ENCOUNTER — Other Ambulatory Visit (HOSPITAL_COMMUNITY): Payer: Self-pay | Admitting: Family Medicine

## 2013-09-23 ENCOUNTER — Ambulatory Visit (HOSPITAL_COMMUNITY)
Admission: RE | Admit: 2013-09-23 | Discharge: 2013-09-23 | Disposition: A | Discharge status: Home / Self Care | Payer: Medicare Other | Source: Ambulatory Visit | Attending: Family Medicine | Admitting: Family Medicine

## 2013-09-23 DIAGNOSIS — Z1231 Encounter for screening mammogram for malignant neoplasm of breast: Secondary | ICD-10-CM

## 2013-09-24 ENCOUNTER — Other Ambulatory Visit: Payer: Self-pay | Admitting: Family Medicine

## 2013-09-24 DIAGNOSIS — R928 Other abnormal and inconclusive findings on diagnostic imaging of breast: Secondary | ICD-10-CM

## 2013-09-25 ENCOUNTER — Encounter (HOSPITAL_COMMUNITY): Payer: Self-pay | Admitting: *Deleted

## 2013-09-25 ENCOUNTER — Encounter (HOSPITAL_COMMUNITY)
Admission: RE | Disposition: A | Discharge status: Home / Self Care | Payer: Self-pay | Source: Ambulatory Visit | Attending: Internal Medicine

## 2013-09-25 ENCOUNTER — Ambulatory Visit (HOSPITAL_COMMUNITY)
Admission: RE | Admit: 2013-09-25 | Discharge: 2013-09-25 | Disposition: A | Discharge status: Home / Self Care | Payer: Medicare Other | Source: Ambulatory Visit | Attending: Internal Medicine | Admitting: Internal Medicine

## 2013-09-25 DIAGNOSIS — I10 Essential (primary) hypertension: Secondary | ICD-10-CM | POA: Insufficient documentation

## 2013-09-25 DIAGNOSIS — Z79899 Other long term (current) drug therapy: Secondary | ICD-10-CM | POA: Diagnosis not present

## 2013-09-25 DIAGNOSIS — R7301 Impaired fasting glucose: Secondary | ICD-10-CM | POA: Diagnosis not present

## 2013-09-25 DIAGNOSIS — D126 Benign neoplasm of colon, unspecified: Secondary | ICD-10-CM | POA: Insufficient documentation

## 2013-09-25 DIAGNOSIS — E785 Hyperlipidemia, unspecified: Secondary | ICD-10-CM | POA: Insufficient documentation

## 2013-09-25 DIAGNOSIS — Z1211 Encounter for screening for malignant neoplasm of colon: Secondary | ICD-10-CM | POA: Insufficient documentation

## 2013-09-25 DIAGNOSIS — K644 Residual hemorrhoidal skin tags: Secondary | ICD-10-CM | POA: Insufficient documentation

## 2013-09-25 DIAGNOSIS — K439 Ventral hernia without obstruction or gangrene: Secondary | ICD-10-CM | POA: Diagnosis not present

## 2013-09-25 DIAGNOSIS — K5730 Diverticulosis of large intestine without perforation or abscess without bleeding: Secondary | ICD-10-CM

## 2013-09-25 HISTORY — PX: COLONOSCOPY: SHX5424

## 2013-09-25 SURGERY — COLONOSCOPY
Anesthesia: Moderate Sedation

## 2013-09-25 MED ORDER — MEPERIDINE HCL 50 MG/ML IJ SOLN
INTRAMUSCULAR | Status: DC | PRN
  Administered 2013-09-25 (×2): 25 mg

## 2013-09-25 MED ORDER — MEPERIDINE HCL 50 MG/ML IJ SOLN
INTRAMUSCULAR | Status: AC
  Filled 2013-09-25: qty 1

## 2013-09-25 MED ORDER — STERILE WATER FOR IRRIGATION IR SOLN
Status: DC | PRN
  Administered 2013-09-25: 12:00:00

## 2013-09-25 MED ORDER — SODIUM CHLORIDE 0.9 % IV SOLN
INTRAVENOUS | Status: DC
Start: 2013-09-25 — End: 2013-09-25
  Administered 2013-09-25: 11:00:00 via INTRAVENOUS

## 2013-09-25 MED ORDER — MIDAZOLAM HCL 5 MG/5ML IJ SOLN
INTRAMUSCULAR | Status: AC
  Filled 2013-09-25: qty 10

## 2013-09-25 MED ORDER — MIDAZOLAM HCL 5 MG/5ML IJ SOLN
INTRAMUSCULAR | Status: DC | PRN
  Administered 2013-09-25 (×5): 2 mg via INTRAVENOUS

## 2013-09-25 NOTE — Op Note (Signed)
COLONOSCOPY PROCEDURE REPORT  PATIENT:  Caitlin [name]  MR#:  [id number] Birthdate:  [birth year], 68 y.o., female Endoscopist:  Dr. Rogene Houston, MD Referred By:  Dr. [name], MD  Procedure Date: 09/25/2013  Procedure:   Colonoscopy  Indications:  Patient is 68 year old Caucasian female who is undergoing average risk screening colonoscopy. Her last exam was in July 2004.  Informed Consent:  The procedure and risks were reviewed with the patient and informed consent was obtained.  Medications:  Demerol 50 mg IV Versed 10 mg IV  Description of procedure:  After a digital rectal exam was performed, that colonoscope was advanced from the anus through the rectum and colon to the area of the cecum, ileocecal valve and appendiceal orifice. The cecum was deeply intubated. These structures were well-seen. From the level of the cecum and ileocecal valve, the scope was slowly and cautiously withdrawn. The mucosal surfaces were carefully surveyed utilizing scope tip to flexion to facilitate fold flattening as needed. The scope was pulled down into the rectum where a thorough exam including retroflexion was performed.  Findings:  Prep satisfactory. Moderate number of diverticula note sigmoid and descending colon with few more in rest of the colon. Part of transverse colon noted to be inside ventral hernia and abdominal pressure facilitated advancing the scope. Two small polyps were ablated via cold biopsy. One was located at hepatic flexure and the other one was at transverse colon. Normal rectal mucosa. Prominent hemorrhoids below the dentate line.    Therapeutic/Diagnostic Maneuvers Performed:  See above    Complications:  None  Cecal Withdrawal Time:  10  minutes  Impression:  Examination performed to cecum. Part of transverse colon noted to be located inside ventral hernia. Two small polyps ablated via cold biopsy(hepatic flexure and transverse colon) and submitted  together. Pancolonic diverticulosis. External hemorrhoids.  Recommendations:  Standard instructions given. I will contact patient with biopsy results and further recommendations.  REHMAN,NAJEEB U  09/25/2013 1:12 PM  CC: Dr. Hilma Favors, Betsy Coder, MD & Dr. Rayne Du ref. provider found

## 2013-09-25 NOTE — Discharge Instructions (Signed)
No aspirin or NSAIDs for one week. Resume usual medications and high fiber diet. No driving for 24 hours. Physician will contact you with biopsy results  Colonoscopy, Care After Refer to this sheet in the next few weeks. These instructions provide you with information on caring for yourself after your procedure. Your health care provider may also give you more specific instructions. Your treatment has been planned according to current medical practices, but problems sometimes occur. Call your health care provider if you have any problems or questions after your procedure. WHAT TO EXPECT AFTER THE PROCEDURE  After your procedure, it is typical to have the following:  A small amount of blood in your stool.  Moderate amounts of gas and mild abdominal cramping or bloating. HOME CARE INSTRUCTIONS  Do not drive, operate machinery, or sign important documents for 24 hours.  You may shower and resume your regular physical activities, but move at a slower pace for the first 24 hours.  Take frequent rest periods for the first 24 hours.  Walk around or put a warm pack on your abdomen to help reduce abdominal cramping and bloating.  Drink enough fluids to keep your urine clear or pale yellow.  You may resume your normal diet as instructed by your health care provider. Avoid heavy or fried foods that are hard to digest.  Avoid drinking alcohol for 24 hours or as instructed by your health care provider.  Only take over-the-counter or prescription medicines as directed by your health care provider.  If a tissue sample (biopsy) was taken during your procedure:  Do not take aspirin or blood thinners for 7 days, or as instructed by your health care provider.  Do not drink alcohol for 7 days, or as instructed by your health care provider.  Eat soft foods for the first 24 hours. SEEK MEDICAL CARE IF: You have persistent spotting of blood in your stool 2 3 days after the procedure. SEEK IMMEDIATE  MEDICAL CARE IF:  You have more than a small spotting of blood in your stool.  You pass large blood clots in your stool.  Your abdomen is swollen (distended).  You have nausea or vomiting.  You have a fever.  You have increasing abdominal pain that is not relieved with medicine.

## 2013-09-25 NOTE — H&P (Signed)
Caitlin [name] is an 68 y.o. female.   Chief Complaint: Patient is 68 here for colonoscopy. HPI: Patient is 68 year old Caucasian female who is here for screening colonoscopy. She denies abdominal pain change in bowel habits rectal bleeding. Last colonoscopy was in July 2004. Family history is negative for CRC.  Past Medical History  Diagnosis Date  . Asthma   . Hyperlipidemia   . Impaired fasting glucose   . Hypertension   . Breast mass     benign    Past Surgical History  Procedure Laterality Date  . Cholecystectomy  09/08/2010  . Vaginal hysterectomy  1980s  . Hernia repair  12/24/2010  . Tonsillectomy  1968  . Laser ablation of vascular lesion  2007/2008  . Colonoscopy    . Upper gastrointestinal endoscopy      Family History  Problem Relation Age of Onset  . Colon cancer Father   . Rectal cancer Father   . Prostate cancer Father   . Dementia Mother   . Osteoporosis Mother   . Pancreatic cancer Paternal Grandfather   . Heart disease Paternal Grandmother   . Heart disease Maternal Grandfather   . Breast cancer Maternal Grandmother   . Healthy Daughter   . Healthy Son    Social History:  reports that she has never smoked. She has never used smokeless tobacco. She reports that she drinks alcohol. She reports that she does not use illicit drugs.  Allergies:  Allergies  Allergen Reactions  . Tetanus Toxoids     Medications Prior to Admission  Medication Sig Dispense Refill  . budesonide-formoterol (SYMBICORT) 160-4.5 MCG/ACT inhaler Inhale 2 puffs into the lungs daily as needed (for shortness of breath). Take 1 puffs first thing in am and then another 1 puffs about 12 hours later      . dextromethorphan-guaiFENesin (MUCINEX DM) 30-600 MG per 12 hr tablet Take 1 tablet by mouth daily as needed for cough (and congestion).       . diphenhydramine-acetaminophen (TYLENOL PM) 25-500 MG TABS Take 1 tablet by mouth at bedtime as needed (for sleep).       [name] esomeprazole  (NEXIUM) 40 MG capsule Take 40 mg by mouth daily at 12 noon.      . famotidine (PEPCID) 20 MG tablet Take 20 mg by mouth at bedtime.      . fluticasone (FLONASE) 50 MCG/ACT nasal spray Place 1 spray into both nostrils daily.      [name] MILK THISTLE EXTRACT PO Take 1 tablet by mouth. 107m      . Multiple Vitamin (MULTIVITAMIN) capsule Take 1 capsule by mouth daily.      . peg 3350 powder (MOVIPREP) 100 G SOLR Take 1 kit (200 g total) by mouth once.  1 kit  0  . rosuvastatin (CRESTOR) 20 MG tablet Take 20 mg by mouth daily.      .Marland Kitchentelmisartan-hydrochlorothiazide (MICARDIS HCT) 80-25 MG per tablet Take 0.5 tablets by mouth daily.       .Marland Kitchenzolpidem (AMBIEN) 10 MG tablet Take 10 mg by mouth at bedtime as needed.        No results found for this or any previous visit (from the past 48 hour(s)). No results found.  ROS  Blood pressure 127/78, pulse 73, temperature 98.2 F (36.8 C), temperature source Oral, resp. rate 19, height 5' 4"  (1.626 m), weight 219 lb (99.338 kg), SpO2 99.00%. Physical Exam  Constitutional: She appears well-developed and well-nourished.  HENT:  Mouth/Throat: Oropharynx is clear  and moist.  Eyes: Conjunctivae are normal. No scleral icterus.  Neck: No thyromegaly present.  Cardiovascular: Normal rate, regular rhythm and normal heart sounds.   No murmur heard. Respiratory: Effort normal and breath sounds normal.  GI: Soft. She exhibits no distension. There is no tenderness. There is no guarding.  Large mid and left-sided ventral hernia which is partially reducible and nontender  Musculoskeletal: She exhibits no edema.  Lymphadenopathy:    She has no cervical adenopathy.  Neurological: She is alert.  Skin: Skin is warm and dry.     Assessment/Plan Average risk screening colonoscopy.  Caitlin [name] U 09/25/2013, 12:13 PM

## 2013-09-30 ENCOUNTER — Encounter (HOSPITAL_COMMUNITY): Payer: Self-pay | Admitting: Internal Medicine

## 2013-10-01 ENCOUNTER — Encounter (INDEPENDENT_AMBULATORY_CARE_PROVIDER_SITE_OTHER): Payer: Self-pay | Admitting: *Deleted

## 2013-10-02 ENCOUNTER — Ambulatory Visit (HOSPITAL_COMMUNITY)
Admission: RE | Admit: 2013-10-02 | Discharge: 2013-10-02 | Disposition: A | Discharge status: Home / Self Care | Payer: Medicare Other | Source: Ambulatory Visit | Attending: Family Medicine | Admitting: Family Medicine

## 2013-10-02 DIAGNOSIS — R922 Inconclusive mammogram: Secondary | ICD-10-CM | POA: Diagnosis not present

## 2013-10-02 DIAGNOSIS — R928 Other abnormal and inconclusive findings on diagnostic imaging of breast: Secondary | ICD-10-CM | POA: Insufficient documentation

## 2013-10-18 ENCOUNTER — Other Ambulatory Visit (HOSPITAL_COMMUNITY): Payer: Self-pay | Admitting: Family Medicine

## 2013-10-18 ENCOUNTER — Ambulatory Visit (HOSPITAL_COMMUNITY)
Admission: RE | Admit: 2013-10-18 | Discharge: 2013-10-18 | Disposition: A | Discharge status: Home / Self Care | Payer: Medicare Other | Source: Ambulatory Visit | Attending: Family Medicine | Admitting: Family Medicine

## 2013-10-18 DIAGNOSIS — S99929A Unspecified injury of unspecified foot, initial encounter: Principal | ICD-10-CM

## 2013-10-18 DIAGNOSIS — Z01419 Encounter for gynecological examination (general) (routine) without abnormal findings: Secondary | ICD-10-CM | POA: Diagnosis not present

## 2013-10-18 DIAGNOSIS — M25579 Pain in unspecified ankle and joints of unspecified foot: Secondary | ICD-10-CM | POA: Diagnosis not present

## 2013-10-18 DIAGNOSIS — S93409A Sprain of unspecified ligament of unspecified ankle, initial encounter: Secondary | ICD-10-CM | POA: Diagnosis not present

## 2013-10-18 DIAGNOSIS — X500XXA Overexertion from strenuous movement or load, initial encounter: Secondary | ICD-10-CM | POA: Insufficient documentation

## 2013-10-18 DIAGNOSIS — I10 Essential (primary) hypertension: Secondary | ICD-10-CM | POA: Diagnosis not present

## 2013-10-18 DIAGNOSIS — S8990XA Unspecified injury of unspecified lower leg, initial encounter: Secondary | ICD-10-CM | POA: Insufficient documentation

## 2013-10-18 DIAGNOSIS — S99919A Unspecified injury of unspecified ankle, initial encounter: Principal | ICD-10-CM

## 2013-10-18 DIAGNOSIS — Z6838 Body mass index (BMI) 38.0-38.9, adult: Secondary | ICD-10-CM | POA: Diagnosis not present

## 2013-10-18 DIAGNOSIS — E785 Hyperlipidemia, unspecified: Secondary | ICD-10-CM | POA: Diagnosis not present

## 2013-10-28 DIAGNOSIS — D649 Anemia, unspecified: Secondary | ICD-10-CM | POA: Diagnosis not present

## 2013-10-28 DIAGNOSIS — Z79899 Other long term (current) drug therapy: Secondary | ICD-10-CM | POA: Diagnosis not present

## 2013-11-13 DIAGNOSIS — Z01818 Encounter for other preprocedural examination: Secondary | ICD-10-CM | POA: Diagnosis not present

## 2013-11-25 DIAGNOSIS — H2510 Age-related nuclear cataract, unspecified eye: Secondary | ICD-10-CM | POA: Diagnosis not present

## 2013-11-25 DIAGNOSIS — H5210 Myopia, unspecified eye: Secondary | ICD-10-CM | POA: Diagnosis not present

## 2013-11-25 DIAGNOSIS — H524 Presbyopia: Secondary | ICD-10-CM | POA: Diagnosis not present

## 2013-11-25 DIAGNOSIS — H52229 Regular astigmatism, unspecified eye: Secondary | ICD-10-CM | POA: Diagnosis not present

## 2013-11-29 HISTORY — PX: LAPAROSCOPIC GASTRIC SLEEVE RESECTION: SHX5895

## 2013-12-02 DIAGNOSIS — I10 Essential (primary) hypertension: Secondary | ICD-10-CM | POA: Diagnosis not present

## 2013-12-02 DIAGNOSIS — Z6836 Body mass index (BMI) 36.0-36.9, adult: Secondary | ICD-10-CM | POA: Diagnosis not present

## 2013-12-02 DIAGNOSIS — J45909 Unspecified asthma, uncomplicated: Secondary | ICD-10-CM | POA: Diagnosis not present

## 2013-12-02 DIAGNOSIS — E669 Obesity, unspecified: Secondary | ICD-10-CM | POA: Diagnosis not present

## 2013-12-02 DIAGNOSIS — R7301 Impaired fasting glucose: Secondary | ICD-10-CM | POA: Diagnosis not present

## 2013-12-02 DIAGNOSIS — K432 Incisional hernia without obstruction or gangrene: Secondary | ICD-10-CM | POA: Diagnosis not present

## 2013-12-02 DIAGNOSIS — Z8719 Personal history of other diseases of the digestive system: Secondary | ICD-10-CM | POA: Diagnosis not present

## 2013-12-11 DIAGNOSIS — I10 Essential (primary) hypertension: Secondary | ICD-10-CM | POA: Diagnosis not present

## 2013-12-11 DIAGNOSIS — Z6836 Body mass index (BMI) 36.0-36.9, adult: Secondary | ICD-10-CM | POA: Diagnosis not present

## 2013-12-11 DIAGNOSIS — D175 Benign lipomatous neoplasm of intra-abdominal organs: Secondary | ICD-10-CM | POA: Diagnosis not present

## 2013-12-11 DIAGNOSIS — J45909 Unspecified asthma, uncomplicated: Secondary | ICD-10-CM | POA: Diagnosis present

## 2013-12-11 DIAGNOSIS — K449 Diaphragmatic hernia without obstruction or gangrene: Secondary | ICD-10-CM | POA: Diagnosis not present

## 2013-12-11 DIAGNOSIS — K219 Gastro-esophageal reflux disease without esophagitis: Secondary | ICD-10-CM | POA: Diagnosis not present

## 2013-12-11 DIAGNOSIS — R9431 Abnormal electrocardiogram [ECG] [EKG]: Secondary | ICD-10-CM | POA: Diagnosis not present

## 2013-12-11 DIAGNOSIS — E785 Hyperlipidemia, unspecified: Secondary | ICD-10-CM | POA: Diagnosis present

## 2014-01-06 DIAGNOSIS — I10 Essential (primary) hypertension: Secondary | ICD-10-CM | POA: Diagnosis not present

## 2014-01-06 DIAGNOSIS — Z0189 Encounter for other specified special examinations: Secondary | ICD-10-CM | POA: Diagnosis not present

## 2014-01-06 DIAGNOSIS — E669 Obesity, unspecified: Secondary | ICD-10-CM | POA: Diagnosis not present

## 2014-01-06 DIAGNOSIS — K219 Gastro-esophageal reflux disease without esophagitis: Secondary | ICD-10-CM | POA: Diagnosis not present

## 2014-02-17 DIAGNOSIS — Z6834 Body mass index (BMI) 34.0-34.9, adult: Secondary | ICD-10-CM | POA: Diagnosis not present

## 2014-02-17 DIAGNOSIS — J0100 Acute maxillary sinusitis, unspecified: Secondary | ICD-10-CM | POA: Diagnosis not present

## 2014-02-17 DIAGNOSIS — Z9884 Bariatric surgery status: Secondary | ICD-10-CM | POA: Diagnosis not present

## 2014-02-17 DIAGNOSIS — J069 Acute upper respiratory infection, unspecified: Secondary | ICD-10-CM | POA: Diagnosis not present

## 2014-03-07 ENCOUNTER — Other Ambulatory Visit (HOSPITAL_COMMUNITY): Payer: Self-pay | Admitting: Family Medicine

## 2014-03-07 DIAGNOSIS — R928 Other abnormal and inconclusive findings on diagnostic imaging of breast: Secondary | ICD-10-CM

## 2014-03-11 DIAGNOSIS — R7989 Other specified abnormal findings of blood chemistry: Secondary | ICD-10-CM | POA: Diagnosis not present

## 2014-03-11 DIAGNOSIS — Z713 Dietary counseling and surveillance: Secondary | ICD-10-CM | POA: Diagnosis not present

## 2014-03-11 DIAGNOSIS — E46 Unspecified protein-calorie malnutrition: Secondary | ICD-10-CM | POA: Diagnosis not present

## 2014-04-15 ENCOUNTER — Ambulatory Visit (HOSPITAL_COMMUNITY)
Admission: RE | Admit: 2014-04-15 | Discharge: 2014-04-15 | Disposition: A | Discharge status: Home / Self Care | Payer: Medicare Other | Source: Ambulatory Visit | Attending: Family Medicine | Admitting: Family Medicine

## 2014-04-15 DIAGNOSIS — R928 Other abnormal and inconclusive findings on diagnostic imaging of breast: Secondary | ICD-10-CM

## 2014-05-30 DIAGNOSIS — Z23 Encounter for immunization: Secondary | ICD-10-CM | POA: Diagnosis not present

## 2014-06-05 ENCOUNTER — Ambulatory Visit (INDEPENDENT_AMBULATORY_CARE_PROVIDER_SITE_OTHER): Payer: Medicare Other | Admitting: Otolaryngology

## 2014-06-05 DIAGNOSIS — J310 Chronic rhinitis: Secondary | ICD-10-CM | POA: Diagnosis not present

## 2014-06-09 DIAGNOSIS — I10 Essential (primary) hypertension: Secondary | ICD-10-CM | POA: Diagnosis not present

## 2014-06-09 DIAGNOSIS — J45909 Unspecified asthma, uncomplicated: Secondary | ICD-10-CM | POA: Diagnosis not present

## 2014-06-09 DIAGNOSIS — Z09 Encounter for follow-up examination after completed treatment for conditions other than malignant neoplasm: Secondary | ICD-10-CM | POA: Diagnosis not present

## 2014-06-09 DIAGNOSIS — Z903 Acquired absence of stomach [part of]: Secondary | ICD-10-CM | POA: Diagnosis not present

## 2014-07-10 ENCOUNTER — Other Ambulatory Visit (HOSPITAL_COMMUNITY): Payer: Self-pay | Admitting: Preventative Medicine

## 2014-07-10 ENCOUNTER — Ambulatory Visit (HOSPITAL_COMMUNITY)
Admission: RE | Admit: 2014-07-10 | Discharge: 2014-07-10 | Disposition: A | Discharge status: Home / Self Care | Payer: Medicare Other | Source: Ambulatory Visit | Attending: Preventative Medicine | Admitting: Preventative Medicine

## 2014-07-10 DIAGNOSIS — I82812 Embolism and thrombosis of superficial veins of left lower extremities: Secondary | ICD-10-CM | POA: Diagnosis not present

## 2014-07-10 DIAGNOSIS — I80292 Phlebitis and thrombophlebitis of other deep vessels of left lower extremity: Secondary | ICD-10-CM | POA: Diagnosis not present

## 2014-07-10 DIAGNOSIS — R609 Edema, unspecified: Secondary | ICD-10-CM

## 2014-07-10 DIAGNOSIS — I824Z2 Acute embolism and thrombosis of unspecified deep veins of left distal lower extremity: Secondary | ICD-10-CM | POA: Insufficient documentation

## 2014-07-10 DIAGNOSIS — M7989 Other specified soft tissue disorders: Secondary | ICD-10-CM | POA: Diagnosis present

## 2014-07-15 ENCOUNTER — Telehealth: Payer: Self-pay | Admitting: *Deleted

## 2014-07-15 NOTE — Telephone Encounter (Signed)
Reviewed the ultrasound at [facility] with Dr. Kellie Simmering. He thinks heat and Ibuprofen are appro treatments for her thrombo phlebitis. If there is no improvement over the next three weeks, she should call and have another ultrasound here so we can see what is going on. I told the patient all of this and she is reassured and ok with this plan.

## 2014-07-21 DIAGNOSIS — N3000 Acute cystitis without hematuria: Secondary | ICD-10-CM | POA: Diagnosis not present

## 2014-08-18 ENCOUNTER — Other Ambulatory Visit (HOSPITAL_COMMUNITY): Payer: Self-pay | Admitting: Family Medicine

## 2014-08-18 DIAGNOSIS — Z1231 Encounter for screening mammogram for malignant neoplasm of breast: Secondary | ICD-10-CM

## 2014-08-28 DIAGNOSIS — K432 Incisional hernia without obstruction or gangrene: Secondary | ICD-10-CM | POA: Diagnosis not present

## 2014-09-12 ENCOUNTER — Other Ambulatory Visit (HOSPITAL_COMMUNITY): Payer: Self-pay | Admitting: Family Medicine

## 2014-09-12 DIAGNOSIS — J069 Acute upper respiratory infection, unspecified: Secondary | ICD-10-CM | POA: Diagnosis not present

## 2014-09-12 DIAGNOSIS — I10 Essential (primary) hypertension: Secondary | ICD-10-CM | POA: Diagnosis not present

## 2014-09-12 DIAGNOSIS — E782 Mixed hyperlipidemia: Secondary | ICD-10-CM | POA: Diagnosis not present

## 2014-09-12 DIAGNOSIS — Z6832 Body mass index (BMI) 32.0-32.9, adult: Secondary | ICD-10-CM | POA: Diagnosis not present

## 2014-09-12 DIAGNOSIS — M8580 Other specified disorders of bone density and structure, unspecified site: Secondary | ICD-10-CM

## 2014-09-12 DIAGNOSIS — J4540 Moderate persistent asthma, uncomplicated: Secondary | ICD-10-CM | POA: Diagnosis not present

## 2014-09-16 ENCOUNTER — Other Ambulatory Visit (HOSPITAL_COMMUNITY): Payer: Self-pay

## 2014-09-24 ENCOUNTER — Ambulatory Visit (HOSPITAL_COMMUNITY)
Admission: RE | Admit: 2014-09-24 | Discharge: 2014-09-24 | Disposition: A | Discharge status: Home / Self Care | Payer: Medicare Other | Source: Ambulatory Visit | Attending: Family Medicine | Admitting: Family Medicine

## 2014-09-24 DIAGNOSIS — M859 Disorder of bone density and structure, unspecified: Secondary | ICD-10-CM | POA: Insufficient documentation

## 2014-09-24 DIAGNOSIS — Z780 Asymptomatic menopausal state: Secondary | ICD-10-CM | POA: Diagnosis not present

## 2014-09-24 DIAGNOSIS — M8580 Other specified disorders of bone density and structure, unspecified site: Secondary | ICD-10-CM

## 2014-10-01 ENCOUNTER — Ambulatory Visit (HOSPITAL_COMMUNITY)
Admission: RE | Admit: 2014-10-01 | Discharge: 2014-10-01 | Disposition: A | Discharge status: Home / Self Care | Payer: Medicare Other | Source: Ambulatory Visit | Attending: Family Medicine | Admitting: Family Medicine

## 2014-10-01 DIAGNOSIS — Z1231 Encounter for screening mammogram for malignant neoplasm of breast: Secondary | ICD-10-CM | POA: Insufficient documentation

## 2014-10-08 DIAGNOSIS — K436 Other and unspecified ventral hernia with obstruction, without gangrene: Secondary | ICD-10-CM | POA: Diagnosis not present

## 2014-10-13 DIAGNOSIS — E6609 Other obesity due to excess calories: Secondary | ICD-10-CM | POA: Diagnosis not present

## 2014-10-13 DIAGNOSIS — Z6832 Body mass index (BMI) 32.0-32.9, adult: Secondary | ICD-10-CM | POA: Diagnosis not present

## 2014-10-13 DIAGNOSIS — R945 Abnormal results of liver function studies: Secondary | ICD-10-CM | POA: Diagnosis not present

## 2014-10-13 DIAGNOSIS — K760 Fatty (change of) liver, not elsewhere classified: Secondary | ICD-10-CM | POA: Diagnosis not present

## 2014-10-29 DIAGNOSIS — Z713 Dietary counseling and surveillance: Secondary | ICD-10-CM | POA: Diagnosis not present

## 2014-12-02 DIAGNOSIS — K432 Incisional hernia without obstruction or gangrene: Secondary | ICD-10-CM | POA: Diagnosis not present

## 2014-12-02 DIAGNOSIS — K219 Gastro-esophageal reflux disease without esophagitis: Secondary | ICD-10-CM | POA: Diagnosis not present

## 2014-12-02 DIAGNOSIS — Z8249 Family history of ischemic heart disease and other diseases of the circulatory system: Secondary | ICD-10-CM | POA: Diagnosis not present

## 2014-12-02 DIAGNOSIS — Z823 Family history of stroke: Secondary | ICD-10-CM | POA: Diagnosis not present

## 2014-12-02 DIAGNOSIS — I10 Essential (primary) hypertension: Secondary | ICD-10-CM | POA: Diagnosis not present

## 2014-12-02 DIAGNOSIS — Z79899 Other long term (current) drug therapy: Secondary | ICD-10-CM | POA: Diagnosis not present

## 2014-12-25 DIAGNOSIS — R52 Pain, unspecified: Secondary | ICD-10-CM | POA: Diagnosis not present

## 2014-12-25 DIAGNOSIS — Z01812 Encounter for preprocedural laboratory examination: Secondary | ICD-10-CM | POA: Diagnosis not present

## 2014-12-31 DIAGNOSIS — E785 Hyperlipidemia, unspecified: Secondary | ICD-10-CM | POA: Diagnosis present

## 2014-12-31 DIAGNOSIS — K219 Gastro-esophageal reflux disease without esophagitis: Secondary | ICD-10-CM | POA: Diagnosis present

## 2014-12-31 DIAGNOSIS — R109 Unspecified abdominal pain: Secondary | ICD-10-CM | POA: Diagnosis not present

## 2014-12-31 DIAGNOSIS — Z9884 Bariatric surgery status: Secondary | ICD-10-CM | POA: Diagnosis not present

## 2014-12-31 DIAGNOSIS — R339 Retention of urine, unspecified: Secondary | ICD-10-CM | POA: Diagnosis not present

## 2014-12-31 DIAGNOSIS — J45909 Unspecified asthma, uncomplicated: Secondary | ICD-10-CM | POA: Diagnosis present

## 2014-12-31 DIAGNOSIS — K432 Incisional hernia without obstruction or gangrene: Secondary | ICD-10-CM | POA: Diagnosis not present

## 2014-12-31 DIAGNOSIS — I10 Essential (primary) hypertension: Secondary | ICD-10-CM | POA: Diagnosis present

## 2014-12-31 DIAGNOSIS — G8918 Other acute postprocedural pain: Secondary | ICD-10-CM | POA: Diagnosis not present

## 2014-12-31 DIAGNOSIS — Z9049 Acquired absence of other specified parts of digestive tract: Secondary | ICD-10-CM | POA: Diagnosis present

## 2014-12-31 DIAGNOSIS — M25511 Pain in right shoulder: Secondary | ICD-10-CM | POA: Diagnosis not present

## 2015-01-09 DIAGNOSIS — Z8719 Personal history of other diseases of the digestive system: Secondary | ICD-10-CM | POA: Diagnosis not present

## 2015-01-09 DIAGNOSIS — Z48815 Encounter for surgical aftercare following surgery on the digestive system: Secondary | ICD-10-CM | POA: Diagnosis not present

## 2015-01-09 DIAGNOSIS — Z9889 Other specified postprocedural states: Secondary | ICD-10-CM | POA: Diagnosis not present

## 2015-01-26 ENCOUNTER — Other Ambulatory Visit: Payer: Self-pay

## 2015-01-27 DIAGNOSIS — H524 Presbyopia: Secondary | ICD-10-CM | POA: Diagnosis not present

## 2015-01-27 DIAGNOSIS — H52223 Regular astigmatism, bilateral: Secondary | ICD-10-CM | POA: Diagnosis not present

## 2015-01-27 DIAGNOSIS — H5203 Hypermetropia, bilateral: Secondary | ICD-10-CM | POA: Diagnosis not present

## 2015-01-27 DIAGNOSIS — H35412 Lattice degeneration of retina, left eye: Secondary | ICD-10-CM | POA: Diagnosis not present

## 2015-02-12 DIAGNOSIS — Z8719 Personal history of other diseases of the digestive system: Secondary | ICD-10-CM | POA: Diagnosis not present

## 2015-02-12 DIAGNOSIS — Z48815 Encounter for surgical aftercare following surgery on the digestive system: Secondary | ICD-10-CM | POA: Diagnosis not present

## 2015-02-13 ENCOUNTER — Ambulatory Visit (HOSPITAL_COMMUNITY)
Admission: RE | Admit: 2015-02-13 | Discharge: 2015-02-13 | Disposition: A | Discharge status: Home / Self Care | Payer: Medicare Other | Source: Ambulatory Visit | Attending: Preventative Medicine | Admitting: Preventative Medicine

## 2015-02-13 ENCOUNTER — Other Ambulatory Visit (HOSPITAL_COMMUNITY): Payer: Self-pay | Admitting: Preventative Medicine

## 2015-02-13 DIAGNOSIS — M79604 Pain in right leg: Secondary | ICD-10-CM

## 2015-02-13 DIAGNOSIS — M7989 Other specified soft tissue disorders: Secondary | ICD-10-CM | POA: Insufficient documentation

## 2015-02-13 DIAGNOSIS — M79661 Pain in right lower leg: Secondary | ICD-10-CM | POA: Diagnosis not present

## 2015-02-13 DIAGNOSIS — I82811 Embolism and thrombosis of superficial veins of right lower extremities: Secondary | ICD-10-CM | POA: Diagnosis not present

## 2015-05-18 DIAGNOSIS — Z23 Encounter for immunization: Secondary | ICD-10-CM | POA: Diagnosis not present

## 2015-08-20 ENCOUNTER — Other Ambulatory Visit (HOSPITAL_COMMUNITY): Payer: Self-pay | Admitting: Family Medicine

## 2015-08-20 DIAGNOSIS — Z1231 Encounter for screening mammogram for malignant neoplasm of breast: Secondary | ICD-10-CM

## 2015-08-31 ENCOUNTER — Ambulatory Visit (HOSPITAL_COMMUNITY)
Admission: RE | Admit: 2015-08-31 | Discharge: 2015-08-31 | Disposition: A | Discharge status: Home / Self Care | Payer: Medicare Other | Source: Ambulatory Visit | Attending: Internal Medicine | Admitting: Internal Medicine

## 2015-08-31 ENCOUNTER — Other Ambulatory Visit (HOSPITAL_COMMUNITY): Payer: Self-pay | Admitting: Internal Medicine

## 2015-08-31 DIAGNOSIS — Z1389 Encounter for screening for other disorder: Secondary | ICD-10-CM | POA: Insufficient documentation

## 2015-08-31 DIAGNOSIS — I517 Cardiomegaly: Secondary | ICD-10-CM | POA: Diagnosis not present

## 2015-08-31 DIAGNOSIS — J9811 Atelectasis: Secondary | ICD-10-CM | POA: Diagnosis not present

## 2015-08-31 DIAGNOSIS — N390 Urinary tract infection, site not specified: Secondary | ICD-10-CM | POA: Diagnosis not present

## 2015-08-31 DIAGNOSIS — R509 Fever, unspecified: Secondary | ICD-10-CM | POA: Diagnosis not present

## 2015-08-31 DIAGNOSIS — E782 Mixed hyperlipidemia: Secondary | ICD-10-CM | POA: Diagnosis not present

## 2015-08-31 DIAGNOSIS — I10 Essential (primary) hypertension: Secondary | ICD-10-CM | POA: Diagnosis not present

## 2015-08-31 DIAGNOSIS — Z6832 Body mass index (BMI) 32.0-32.9, adult: Secondary | ICD-10-CM | POA: Diagnosis not present

## 2015-09-07 DIAGNOSIS — I10 Essential (primary) hypertension: Secondary | ICD-10-CM | POA: Diagnosis not present

## 2015-09-07 DIAGNOSIS — Z6832 Body mass index (BMI) 32.0-32.9, adult: Secondary | ICD-10-CM | POA: Diagnosis not present

## 2015-09-07 DIAGNOSIS — E782 Mixed hyperlipidemia: Secondary | ICD-10-CM | POA: Diagnosis not present

## 2015-09-07 DIAGNOSIS — E781 Pure hyperglyceridemia: Secondary | ICD-10-CM | POA: Diagnosis not present

## 2015-09-07 DIAGNOSIS — R509 Fever, unspecified: Secondary | ICD-10-CM | POA: Diagnosis not present

## 2015-09-07 DIAGNOSIS — Z1389 Encounter for screening for other disorder: Secondary | ICD-10-CM | POA: Diagnosis not present

## 2015-09-07 DIAGNOSIS — J209 Acute bronchitis, unspecified: Secondary | ICD-10-CM | POA: Diagnosis not present

## 2015-09-08 DIAGNOSIS — R7301 Impaired fasting glucose: Secondary | ICD-10-CM | POA: Diagnosis not present

## 2015-09-08 DIAGNOSIS — E871 Hypo-osmolality and hyponatremia: Secondary | ICD-10-CM | POA: Diagnosis not present

## 2015-10-05 ENCOUNTER — Ambulatory Visit (HOSPITAL_COMMUNITY)
Admission: RE | Admit: 2015-10-05 | Discharge: 2015-10-05 | Disposition: A | Discharge status: Home / Self Care | Payer: Medicare Other | Source: Ambulatory Visit | Attending: Family Medicine | Admitting: Family Medicine

## 2015-10-05 DIAGNOSIS — Z1231 Encounter for screening mammogram for malignant neoplasm of breast: Secondary | ICD-10-CM | POA: Diagnosis present

## 2015-10-05 DIAGNOSIS — R928 Other abnormal and inconclusive findings on diagnostic imaging of breast: Secondary | ICD-10-CM | POA: Insufficient documentation

## 2015-10-07 ENCOUNTER — Other Ambulatory Visit: Payer: Self-pay | Admitting: Family Medicine

## 2015-10-07 DIAGNOSIS — R928 Other abnormal and inconclusive findings on diagnostic imaging of breast: Secondary | ICD-10-CM

## 2015-10-08 ENCOUNTER — Other Ambulatory Visit (HOSPITAL_COMMUNITY): Payer: Self-pay | Admitting: Family Medicine

## 2015-10-08 DIAGNOSIS — R928 Other abnormal and inconclusive findings on diagnostic imaging of breast: Secondary | ICD-10-CM

## 2015-10-20 ENCOUNTER — Other Ambulatory Visit (HOSPITAL_COMMUNITY): Payer: Self-pay | Admitting: Family Medicine

## 2015-10-20 ENCOUNTER — Ambulatory Visit (HOSPITAL_COMMUNITY)
Admission: RE | Admit: 2015-10-20 | Discharge: 2015-10-20 | Disposition: A | Discharge status: Home / Self Care | Payer: Medicare Other | Source: Ambulatory Visit | Attending: Family Medicine | Admitting: Family Medicine

## 2015-10-20 DIAGNOSIS — N63 Unspecified lump in breast: Secondary | ICD-10-CM | POA: Diagnosis not present

## 2015-10-20 DIAGNOSIS — R928 Other abnormal and inconclusive findings on diagnostic imaging of breast: Secondary | ICD-10-CM | POA: Diagnosis not present

## 2015-10-27 ENCOUNTER — Ambulatory Visit (HOSPITAL_COMMUNITY)
Admission: RE | Admit: 2015-10-27 | Discharge: 2015-10-27 | Disposition: A | Discharge status: Home / Self Care | Payer: Medicare Other | Source: Ambulatory Visit | Attending: Family Medicine | Admitting: Family Medicine

## 2015-10-27 ENCOUNTER — Encounter (HOSPITAL_COMMUNITY): Payer: Self-pay

## 2015-10-27 ENCOUNTER — Other Ambulatory Visit (HOSPITAL_COMMUNITY): Payer: Self-pay | Admitting: Family Medicine

## 2015-10-27 DIAGNOSIS — C50911 Malignant neoplasm of unspecified site of right female breast: Secondary | ICD-10-CM | POA: Insufficient documentation

## 2015-10-27 DIAGNOSIS — N6310 Unspecified lump in the right breast, unspecified quadrant: Secondary | ICD-10-CM

## 2015-10-27 DIAGNOSIS — C50211 Malignant neoplasm of upper-inner quadrant of right female breast: Secondary | ICD-10-CM | POA: Diagnosis not present

## 2015-10-27 DIAGNOSIS — R590 Localized enlarged lymph nodes: Secondary | ICD-10-CM | POA: Diagnosis not present

## 2015-10-27 DIAGNOSIS — N63 Unspecified lump in breast: Secondary | ICD-10-CM | POA: Insufficient documentation

## 2015-10-27 DIAGNOSIS — R928 Other abnormal and inconclusive findings on diagnostic imaging of breast: Secondary | ICD-10-CM | POA: Insufficient documentation

## 2015-10-29 DIAGNOSIS — E6609 Other obesity due to excess calories: Secondary | ICD-10-CM | POA: Diagnosis not present

## 2015-10-29 DIAGNOSIS — I10 Essential (primary) hypertension: Secondary | ICD-10-CM | POA: Diagnosis not present

## 2015-10-29 DIAGNOSIS — Z6832 Body mass index (BMI) 32.0-32.9, adult: Secondary | ICD-10-CM | POA: Diagnosis not present

## 2015-10-29 DIAGNOSIS — R350 Frequency of micturition: Secondary | ICD-10-CM | POA: Diagnosis not present

## 2015-10-29 DIAGNOSIS — E782 Mixed hyperlipidemia: Secondary | ICD-10-CM | POA: Diagnosis not present

## 2015-10-29 DIAGNOSIS — R39198 Other difficulties with micturition: Secondary | ICD-10-CM | POA: Diagnosis not present

## 2015-10-29 DIAGNOSIS — N342 Other urethritis: Secondary | ICD-10-CM | POA: Diagnosis not present

## 2015-10-29 DIAGNOSIS — Z1389 Encounter for screening for other disorder: Secondary | ICD-10-CM | POA: Diagnosis not present

## 2015-10-29 DIAGNOSIS — Z23 Encounter for immunization: Secondary | ICD-10-CM | POA: Diagnosis not present

## 2015-10-29 DIAGNOSIS — Z0001 Encounter for general adult medical examination with abnormal findings: Secondary | ICD-10-CM | POA: Diagnosis not present

## 2015-11-02 ENCOUNTER — Other Ambulatory Visit: Payer: Self-pay | Admitting: General Surgery

## 2015-11-02 DIAGNOSIS — C50211 Malignant neoplasm of upper-inner quadrant of right female breast: Secondary | ICD-10-CM

## 2015-11-02 NOTE — H&P (Signed)
Colon [name]. Caitlin [name] 11/02/2015 1:26 PM Location: [facility] Patient #: [id number] DOB: 29-Nov-1945 Married / Language: English / Race: White Female   History of Present Illness Stark Klein MD; 11/02/2015 2:35 PM) The patient is a 70 year old female who presents with breast cancer. The patient is a 70 year old female who presented with a screening detected right breast mass on mammography. She was seen to have an irregular spiculated mass at 1:00 that is 6 cm in greatest dimension. There was also what appeared to be an abnormal lymph node in the right axilla that was 1.3 cm in greatest dimension. Both of these were biopsied. The breast demonstrated invasive ductal carcinoma, grade 1. The axilla was negative, however the biopsy did not definitively get lymphatic tissue. Prognostic panel has not yet been completed.  She does have an significant family history with colorectal cancer in her father as well as prostate cancer in her father. A paternal grandfather had pancreatic cancer and a maternal grandmother had breast cancer.   Diagnostic mammogram/ultrasound  FINDINGS: Spot compression CC and MLO tomosynthesis was performed of the bilateral breasts. In addition, ML tomosynthesis was performed of the left breast. There is a mass in the central to slightly inner right breast with spiculated margin measuring approximately 4-5 mm. The initially questioned mass seen in the upper-outer far posterior left breast demonstrates imaging features consistent with a small intramammary/ low axillary lymph node. Targeted ultrasound of the right breast was performed demonstrating an irregular spiculated hypoechoic mass at 1 o'clock 5 cm from nipple measuring 0.4 x 0.6 x 0.4 cm. This corresponds with the mass seen at mammography. In addition, a lymph node with a somewhat nodular cortex is seen in the low right axilla measuring approximately 1.2 x 0.5 x 1.3 cm.  IMPRESSION: 1. Suspicious mass  in the right breast at the approximate 1 o'clock location.  2. Lymph node in the low right axilla with cortex nodularity.  Pathology 1. Breast, right, needle core biopsy, 1:00 - INVASIVE DUCTAL CARCINOMA, SEE COMMENT. 2. Lymph node, needle/core biopsy, right axillary - BENIGN ADIPOSE TISSUE. - NO LYMPHOID TISSUE. - NO MALIGNANCY.   Allergies Elbert Ewings, [place]; 11/02/2015 1:30 PM) Tetanus Toxoid Adsorbed *Toxoids**  Medication History Elbert Ewings, [place]; 11/02/2015 1:34 PM) Calcium Citrate (950MG  Tablet, Oral) Active. Docusate Sodium (100MG  Tablet, Oral) Active. Milk Thistle (175MG  Capsule, Oral) Active. Dextromethorphan-GG (30-600MG  Tablet ER, Oral) Active. Pravastatin Sodium (40MG  Tablet, Oral) Active. Magnesium Oxide (400MG  Tablet, Oral) Active. Probiotic (Oral) Active. Vitamin B12 (1000MCG Tablet ER, Oral) Active. Multiple Vitamin (Oral) Active. NexIUM (40MG  Capsule DR, Oral) Active. Budesonide-Formoterol Fumarate (160-4.5MCG/ACT Aerosol, Inhalation) Active. Famotidine (20MG  Tablet, Oral) Active. Telmisartan-HCTZ (80-25MG  Tablet, Oral) Active.    Review of Systems Stark Klein MD; 11/02/2015 2:31 PM) All other systems negative  Vitals Elbert Ewings CMA; 11/02/2015 1:30 PM) 11/02/2015 1:30 PM Weight: 185 lb Height: 63in Body Surface Area: 1.87 m Body Mass Index: 32.77 kg/m  Temp.: 98.70F  Pulse: 57 (Regular)  BP: 124/76 (Sitting, Left Arm, Standard)       Physical Exam Stark Klein MD; 11/02/2015 2:33 PM) General Mental Status-Alert. General Appearance-Consistent with stated age. Hydration-Well hydrated. Voice-Normal.  Head and Neck Head-normocephalic, atraumatic with no lesions or palpable masses. Trachea-midline. Thyroid Gland Characteristics - normal size and consistency.  Eye Eyeball - Bilateral-Extraocular movements intact. Sclera/Conjunctiva - Bilateral-No scleral icterus.  Chest and Lung Exam Chest and lung  exam reveals -quiet, even and easy respiratory effort with no use of accessory muscles and on auscultation,  normal breath sounds, no adventitious sounds and normal vocal resonance. Inspection Chest Wall - Normal. Back - normal.  Breast Note: Breasts are ptotic bilaterally. Bruising on upper inner right breast. lumpectomy scars on left breast. no palpable masses or LAD. no skin dimpling. no nipple retraction or discharge.   Cardiovascular Cardiovascular examination reveals -normal heart sounds, regular rate and rhythm with no murmurs and normal pedal pulses bilaterally.  Abdomen Inspection Inspection of the abdomen reveals - No Hernias. Palpation/Percussion Palpation and Percussion of the abdomen reveal - Soft, Non Tender, No Rebound tenderness, No Rigidity (guarding) and No hepatosplenomegaly. Auscultation Auscultation of the abdomen reveals - Bowel sounds normal. Note: transverse scar from hernia repair.   Neurologic Neurologic evaluation reveals -alert and oriented x 3 with no impairment of recent or remote memory. Mental Status-Normal.  Musculoskeletal Global Assessment -Note: no gross deformities.  Normal Exam - Left-Upper Extremity Strength Normal and Lower Extremity Strength Normal. Normal Exam - Right-Upper Extremity Strength Normal and Lower Extremity Strength Normal.  Lymphatic Head & Neck  General Head & Neck Lymphatics: Bilateral - Description - Normal. Axillary  General Axillary Region: Bilateral - Description - Normal. Tenderness - Non Tender. Femoral & Inguinal  Generalized Femoral & Inguinal Lymphatics: Bilateral - Description - No Generalized lymphadenopathy.    Assessment & Plan Stark Klein MD; 11/02/2015 2:37 PM) PRIMARY CANCER OF UPPER INNER QUADRANT OF RIGHT FEMALE BREAST (C50.211) Impression: Patient has a new diagnosis of a clinical T1b N0 right breast cancer. This does appear to be low-grade. It would be very unlikely for the lymph  node to be positive. This likely reflects a benign finding. We will discuss this patient at multidisciplinary conference. Patient is a good candidate for lumpectomy. I will set her up for a CT localized lumpectomy with targeted lymph node dissection. She did have the area in her axilla clipped, and I would like to resect that nodule, whatever it is. I discussed that we would know more about additional treatment once the prognostic panel is back, but she would most likely receive radiation and probably an anti-hormone pill. I discussed that there is a possibility for chemotherapy depending on multiple factors from her surgery. I discussed the risk of bleeding, infection, seroma, hematoma, possible risk with additional surgeries or procedures, possible her lung complications, possible chronic breast pain.  The patient would like to proceed with this as soon as possible.  45 min spent in evaluation, examination, counseling, and coordination of care. >50% spent in counseling. Current Plans Referred to Radiation Oncology, for evaluation and follow up (Radiation Oncology). Routine. Referred to Oncology, for evaluation and follow up (Oncology). Routine. Pt Education - flb breast cancer surgery: discussed with patient and provided information. You are being scheduled for surgery - Our schedulers will call you.  You should hear from our office's scheduling department within 5 working days about the location, date, and time of surgery. We try to make accommodations for patient's preferences in scheduling surgery, but sometimes the OR schedule or the surgeon's schedule prevents [place] from making those accommodations.  If you have not heard from our office [phone]) in 5 working days, call the office and ask for your surgeon's nurse.  If you have other questions about your diagnosis, plan, or surgery, call the office and ask for your surgeon's nurse.    Signed by Stark Klein, MD (11/02/2015 2:38 PM)

## 2015-11-03 NOTE — Progress Notes (Signed)
Location of Breast Cancer:Upper -inner Right female breast  Histology per Pathology Report: Diagnosis 10-27-15 1. Breast, right, needle core biopsy, 1:00 - INVASIVE DUCTAL CARCINOMA, SEE COMMENT. 2. Lymph node, needle/core biopsy, right axillary - BENIGN ADIPOSE TISSUE. - NO LYMPHOID TISSUE. - NO MALIGNANCY. Receptor Status: ER(100 % ), PR (100%), Her2-neu (-), Ki-(3%)  Did patient present with symptoms (if so, please note symptoms) or was this found on screening mammography?: Found on routine mammogram  Past/Anticipated interventions by surgeon, if any:11-11-15 Right lumpectomy  Past/Anticipated interventions by medical oncology, if any:  Unknown  Lymphedema issues, if any: None  Pain issues, if any:   SAFETY ISSUES:  Prior radiation? :No  Pacemaker/ICD? No  Possible current pregnancy?No  Is the patient on methotrexate? : No  Current Complaints / other details:    Father colorectal cancer and prostate cancer, maternal grandmother breast cancer Menarche age 44, G 2,P2, Menopause 15 BC 6 years, IUD 5 years, , HRT No BP 136/62 mmHg  Pulse 93  Temp(Src) 98.2 F (36.8 C) (Oral)  Resp 18  Ht 5' 5"  (1.651 m)  Wt 186 lb (84.369 kg)  BMI 30.95 kg/m2  SpO2 100% Georgena Spurling, RN 11/03/2015,5:34 PM

## 2015-11-04 ENCOUNTER — Encounter (HOSPITAL_BASED_OUTPATIENT_CLINIC_OR_DEPARTMENT_OTHER): Payer: Self-pay | Admitting: *Deleted

## 2015-11-04 ENCOUNTER — Other Ambulatory Visit: Payer: Self-pay | Admitting: General Surgery

## 2015-11-04 ENCOUNTER — Ambulatory Visit
Admission: RE | Admit: 2015-11-04 | Discharge: 2015-11-04 | Disposition: A | Discharge status: Home / Self Care | Payer: Medicare Other | Source: Ambulatory Visit | Attending: Radiation Oncology | Admitting: Radiation Oncology

## 2015-11-04 ENCOUNTER — Encounter: Payer: Self-pay | Admitting: Nurse Practitioner

## 2015-11-04 ENCOUNTER — Encounter: Payer: Self-pay | Admitting: Radiation Oncology

## 2015-11-04 VITALS — BP 136/62 | HR 93 | Temp 98.2°F | Resp 18 | Ht 65.0 in | Wt 186.0 lb

## 2015-11-04 DIAGNOSIS — C50211 Malignant neoplasm of upper-inner quadrant of right female breast: Secondary | ICD-10-CM

## 2015-11-04 DIAGNOSIS — C50911 Malignant neoplasm of unspecified site of right female breast: Secondary | ICD-10-CM | POA: Diagnosis not present

## 2015-11-04 DIAGNOSIS — Z170 Estrogen receptor positive status [ER+]: Secondary | ICD-10-CM | POA: Diagnosis not present

## 2015-11-04 DIAGNOSIS — Z510 Encounter for antineoplastic radiation therapy: Secondary | ICD-10-CM | POA: Insufficient documentation

## 2015-11-04 NOTE — Addendum Note (Signed)
Encounter addended by: Malena Edman, RN on: 11/04/2015  6:16 PM<BR>     Documentation filed: Charges VN

## 2015-11-04 NOTE — Progress Notes (Addendum)
  Radiation Oncology         [phone]) [phone] ________________________________  Initial Outpatient Consultation - Date: 11/04/2015   Name: Caitlin [name] MRN: [id number]   DOB: [birth year]  REFERRING PHYSICIAN: Stark Klein, MD  DIAGNOSIS AND STAGE: T1 N0 invasive ductal carcinoma of the right breast  HISTORY OF PRESENT ILLNESS::Caitlin [name] is a 70 y.o. female who underwent a screening mammogram on 10/06/2015. This showed a mass in the inner quadrant of the right breast measuring 5 mm. Ultrasound confirmed a 6 mm mass in the 1 o' clock position as well as a lymph node in the right axilla measuring 1.3 cm. An abnormality seen in the left breast was consistent with a normal intramammary lymph node. The right lymph node revealed benign adipose tissue with no lymphoid tissue, so a sentinel lymph node biopsy was recommended.  Biopsy of the right breast lesion did reveal invasive ductal carcinoma. This was found to be ER/PR positive and Her2 negative with a Ki67 of 3%. She has no personal history of carcinoma. She had a father with rectal cancer and prostate cancer in his 21s. She had a grandfather with pancreatic cancer and a grandmother who had breast cancer. She had a hysterectomy in the 1980s. Her surgical procedure has not been scheduled yet. She has not seen medical oncology. She lives in [place].   Today, she denies any lymphedema issues.   PREVIOUS RADIATION THERAPY: No  Past medical, social and family history were reviewed in the electronic chart. Review of symptoms was reviewed in the electronic chart. Medications were reviewed in the electronic chart.   PHYSICAL EXAM:  Filed Vitals:   11/04/15 0943  BP: 136/62  Pulse: 93  Temp: 98.2 F (36.8 C)  Resp: 18   This is a pleasant female in no acute distress. She is alert and oriented.   IMPRESSION: Caitlin [name] is a 70 y.o female with T1 N0 invasive ductal carcinoma of the right breast.  PLAN: I spoke to the patient today regarding  her diagnosis and options for treatment. We discussed the equivalence in terms of survival and local failure between mastectomy and breast conservation. We discussed the role of radiation in decreasing local failures in patients who undergo lumpectomy. We discussed the process of simulation and the placement tattoos. We discussed 3-5 weeks of treatment as an outpatient. We discussed the possibility of asymptomatic lung damage. We discussed the low likelihood of secondary malignancies. We discussed the possible side effects including but not limited to skin redness, fatigue, permanent skin darkening, and breast swelling. We discussed the process of simulation and the placement of tattoos. I will see her back after her Oncotype score. I did clarify with her that if she needed chemotherapy this would be performed prior to radiation. She met with our navigators and our survivorship NP. We will schedule CT simulation at a later date.  She will be referred to Dr. Whitney Muse as well.   I spent 35 minutes face to face with the patient and more than 50% of that time was spent in counseling and/or coordination of care.   ------------------------------------------------  Thea Silversmith, MD  This document serves as a record of services personally performed by Thea Silversmith, MD. It was created on her behalf by Jenell Milliner, a trained medical scribe. The creation of this record is based on the scribe's personal observations and the provider's statements to them. This document has been checked and approved by the attending provider.

## 2015-11-04 NOTE — Progress Notes (Signed)
Ms. Sinning is a very pleasant 70 y.o. female from [place], [place] with newly diagnosed invasive ductal carcinoma of the right breast.  Biopsy results revealed the tumor's prognostic profile is ER positive, PR positive, and HER2/neu negative. Ki67 is 3%.  She presents today with her husband for treatment consideration and recommendations from her radiation oncologist, Dr. Pablo Ledger.  I briefly met with Ms. Methot and her husband during her visit today. We discussed the purpose of the Survivorship Clinic, which will include monitoring for recurrence, coordinating completion of age and gender-appropriate cancer screenings, promotion of overall wellness, as well as managing potential late/long-term side effects of anti-cancer treatments.    The treatment plan for Ms. Beers will likely include surgery, radiation therapy, and anti-estrogen therapy. As of today, the intent of treatment for Ms. Ilyas is cure, therefore she will be eligible for the Survivorship Clinic upon her completion of treatment.  Her survivorship care plan (SCP) document will be drafted and updated throughout the course of her treatment trajectory. She will receive the SCP in an office visit with myself in the Survivorship Clinic once she has completed treatment.   Ms. Mallinger was encouraged to ask questions and all questions were answered to her satisfaction.  She was given my business card and encouraged to contact me with any concerns regarding survivorship.  I look forward to participating in her care.   Kenn File, [place] [phone]

## 2015-11-05 ENCOUNTER — Encounter: Payer: Self-pay | Admitting: *Deleted

## 2015-11-06 ENCOUNTER — Encounter (HOSPITAL_COMMUNITY)
Admission: RE | Admit: 2015-11-06 | Discharge: 2015-11-06 | Disposition: A | Discharge status: Home / Self Care | Payer: Medicare Other | Source: Ambulatory Visit | Attending: General Surgery | Admitting: General Surgery

## 2015-11-06 ENCOUNTER — Other Ambulatory Visit: Payer: Self-pay

## 2015-11-06 DIAGNOSIS — C50211 Malignant neoplasm of upper-inner quadrant of right female breast: Secondary | ICD-10-CM | POA: Diagnosis not present

## 2015-11-06 LAB — BASIC METABOLIC PANEL
ANION GAP: 10 (ref 5–15)
BUN: 14 mg/dL (ref 6–20)
CALCIUM: 9.2 mg/dL (ref 8.9–10.3)
CO2: 26 mmol/L (ref 22–32)
Chloride: 104 mmol/L (ref 101–111)
Creatinine, Ser: 0.52 mg/dL (ref 0.44–1.00)
GFR calc Af Amer: >60 mL/min (ref >60)
GLUCOSE: 97 mg/dL (ref 65–99)
POTASSIUM: 3.8 mmol/L (ref 3.5–5.1)
SODIUM: 140 mmol/L (ref 135–145)

## 2015-11-09 ENCOUNTER — Other Ambulatory Visit: Payer: Self-pay | Admitting: General Surgery

## 2015-11-09 ENCOUNTER — Ambulatory Visit
Admission: RE | Admit: 2015-11-09 | Discharge: 2015-11-09 | Disposition: A | Discharge status: Home / Self Care | Payer: Medicare Other | Source: Ambulatory Visit | Attending: General Surgery | Admitting: General Surgery

## 2015-11-09 DIAGNOSIS — C50211 Malignant neoplasm of upper-inner quadrant of right female breast: Secondary | ICD-10-CM

## 2015-11-09 DIAGNOSIS — R590 Localized enlarged lymph nodes: Secondary | ICD-10-CM | POA: Diagnosis not present

## 2015-11-11 ENCOUNTER — Encounter (HOSPITAL_BASED_OUTPATIENT_CLINIC_OR_DEPARTMENT_OTHER): Payer: Self-pay | Admitting: *Deleted

## 2015-11-11 ENCOUNTER — Ambulatory Visit (HOSPITAL_COMMUNITY)
Admission: RE | Admit: 2015-11-11 | Discharge: 2015-11-11 | Disposition: A | Discharge status: Home / Self Care | Payer: Medicare Other | Source: Ambulatory Visit | Attending: General Surgery | Admitting: General Surgery

## 2015-11-11 ENCOUNTER — Ambulatory Visit
Admission: RE | Admit: 2015-11-11 | Discharge: 2015-11-11 | Disposition: A | Discharge status: Home / Self Care | Payer: Medicare Other | Source: Ambulatory Visit | Attending: General Surgery | Admitting: General Surgery

## 2015-11-11 ENCOUNTER — Encounter (HOSPITAL_BASED_OUTPATIENT_CLINIC_OR_DEPARTMENT_OTHER)
Admission: RE | Disposition: A | Discharge status: Home / Self Care | Payer: Self-pay | Source: Ambulatory Visit | Attending: General Surgery

## 2015-11-11 ENCOUNTER — Ambulatory Visit (HOSPITAL_BASED_OUTPATIENT_CLINIC_OR_DEPARTMENT_OTHER): Payer: Medicare Other | Admitting: Anesthesiology

## 2015-11-11 ENCOUNTER — Ambulatory Visit
Admit: 2015-11-11 | Discharge: 2015-11-11 | Disposition: A | Discharge status: Home / Self Care | Payer: Medicare Other | Attending: General Surgery | Admitting: General Surgery

## 2015-11-11 ENCOUNTER — Ambulatory Visit (HOSPITAL_BASED_OUTPATIENT_CLINIC_OR_DEPARTMENT_OTHER)
Admission: RE | Admit: 2015-11-11 | Discharge: 2015-11-11 | Disposition: A | Discharge status: Home / Self Care | Payer: Medicare Other | Source: Ambulatory Visit | Attending: General Surgery | Admitting: General Surgery

## 2015-11-11 DIAGNOSIS — I10 Essential (primary) hypertension: Secondary | ICD-10-CM | POA: Insufficient documentation

## 2015-11-11 DIAGNOSIS — C50211 Malignant neoplasm of upper-inner quadrant of right female breast: Secondary | ICD-10-CM | POA: Diagnosis not present

## 2015-11-11 DIAGNOSIS — R079 Chest pain, unspecified: Secondary | ICD-10-CM | POA: Diagnosis not present

## 2015-11-11 DIAGNOSIS — R590 Localized enlarged lymph nodes: Secondary | ICD-10-CM | POA: Diagnosis not present

## 2015-11-11 DIAGNOSIS — G8918 Other acute postprocedural pain: Secondary | ICD-10-CM | POA: Diagnosis not present

## 2015-11-11 DIAGNOSIS — R928 Other abnormal and inconclusive findings on diagnostic imaging of breast: Secondary | ICD-10-CM | POA: Diagnosis not present

## 2015-11-11 DIAGNOSIS — C50911 Malignant neoplasm of unspecified site of right female breast: Secondary | ICD-10-CM | POA: Diagnosis present

## 2015-11-11 HISTORY — DX: Gastro-esophageal reflux disease without esophagitis: K21.9

## 2015-11-11 HISTORY — DX: Personal history of other diseases of the digestive system: Z87.19

## 2015-11-11 HISTORY — PX: BREAST LUMPECTOMY WITH RADIOACTIVE SEED AND SENTINEL LYMPH NODE BIOPSY: SHX6550

## 2015-11-11 SURGERY — BREAST LUMPECTOMY WITH RADIOACTIVE SEED AND SENTINEL LYMPH NODE BIOPSY
Anesthesia: General | Site: Breast | Laterality: Right

## 2015-11-11 MED ORDER — OXYCODONE HCL 5 MG/5ML PO SOLN
5.0000 mg | Freq: Once | ORAL | Status: AC | PRN
Start: 2015-11-11 — End: 2015-11-11

## 2015-11-11 MED ORDER — MIDAZOLAM HCL 2 MG/2ML IJ SOLN
INTRAMUSCULAR | Status: AC
  Filled 2015-11-11: qty 2

## 2015-11-11 MED ORDER — LACTATED RINGERS IV SOLN
INTRAVENOUS | Status: DC
  Administered 2015-11-11 (×3): via INTRAVENOUS

## 2015-11-11 MED ORDER — DEXAMETHASONE SODIUM PHOSPHATE 10 MG/ML IJ SOLN
INTRAMUSCULAR | Status: AC
  Filled 2015-11-11: qty 1

## 2015-11-11 MED ORDER — GLYCOPYRROLATE 0.2 MG/ML IJ SOLN: 0.2000 mg | Freq: Once | INTRAMUSCULAR | Status: DC | PRN

## 2015-11-11 MED ORDER — BUPIVACAINE HCL (PF) 0.25 % IJ SOLN
INTRAMUSCULAR | Status: AC
  Filled 2015-11-11: qty 30

## 2015-11-11 MED ORDER — DEXAMETHASONE SODIUM PHOSPHATE 4 MG/ML IJ SOLN
INTRAMUSCULAR | Status: DC | PRN
  Administered 2015-11-11: 10 mg via INTRAVENOUS

## 2015-11-11 MED ORDER — SCOPOLAMINE 1 MG/3DAYS TD PT72: 1.0000 | MEDICATED_PATCH | Freq: Once | TRANSDERMAL | Status: DC | PRN

## 2015-11-11 MED ORDER — TECHNETIUM TC 99M SULFUR COLLOID FILTERED
1.0000 | Freq: Once | INTRAVENOUS | Status: AC | PRN
  Administered 2015-11-11: 1 via INTRADERMAL

## 2015-11-11 MED ORDER — PROPOFOL 10 MG/ML IV BOLUS
INTRAVENOUS | Status: AC
  Filled 2015-11-11: qty 20

## 2015-11-11 MED ORDER — FENTANYL CITRATE (PF) 100 MCG/2ML IJ SOLN
50.0000 ug | INTRAMUSCULAR | Status: AC | PRN
  Administered 2015-11-11 (×2): 25 ug via INTRAVENOUS
  Administered 2015-11-11: 50 ug via INTRAVENOUS
  Administered 2015-11-11 (×2): 25 ug via INTRAVENOUS
  Administered 2015-11-11: 100 ug via INTRAVENOUS

## 2015-11-11 MED ORDER — OXYCODONE-ACETAMINOPHEN 5-325 MG PO TABS: 1.0000 | ORAL_TABLET | ORAL | Status: DC | PRN

## 2015-11-11 MED ORDER — SODIUM CHLORIDE 0.9 % IJ SOLN
INTRAMUSCULAR | Status: AC
  Filled 2015-11-11: qty 10

## 2015-11-11 MED ORDER — BUPIVACAINE-EPINEPHRINE (PF) 0.25% -1:200000 IJ SOLN
INTRAMUSCULAR | Status: DC | PRN
  Administered 2015-11-11: 20 mL

## 2015-11-11 MED ORDER — SODIUM CHLORIDE 0.9 % IR SOLN
Status: DC | PRN
  Administered 2015-11-11: 1000 mL

## 2015-11-11 MED ORDER — FENTANYL CITRATE (PF) 100 MCG/2ML IJ SOLN
INTRAMUSCULAR | Status: AC
  Filled 2015-11-11: qty 2

## 2015-11-11 MED ORDER — HYDROMORPHONE HCL 1 MG/ML IJ SOLN: 0.2500 mg | INTRAMUSCULAR | Status: DC | PRN

## 2015-11-11 MED ORDER — MEPERIDINE HCL 25 MG/ML IJ SOLN: 6.2500 mg | INTRAMUSCULAR | Status: DC | PRN

## 2015-11-11 MED ORDER — ONDANSETRON HCL 4 MG/2ML IJ SOLN
INTRAMUSCULAR | Status: DC | PRN
  Administered 2015-11-11: 4 mg via INTRAVENOUS

## 2015-11-11 MED ORDER — CEFAZOLIN SODIUM-DEXTROSE 2-4 GM/100ML-% IV SOLN
INTRAVENOUS | Status: AC
  Filled 2015-11-11: qty 100

## 2015-11-11 MED ORDER — CEFAZOLIN SODIUM-DEXTROSE 2-4 GM/100ML-% IV SOLN
2.0000 g | INTRAVENOUS | Status: AC
  Administered 2015-11-11: 2 g via INTRAVENOUS

## 2015-11-11 MED ORDER — PROPOFOL 10 MG/ML IV BOLUS
INTRAVENOUS | Status: DC | PRN
  Administered 2015-11-11 (×2): 50 mg via INTRAVENOUS
  Administered 2015-11-11: 200 mg via INTRAVENOUS

## 2015-11-11 MED ORDER — OXYCODONE HCL 5 MG PO TABS
5.0000 mg | ORAL_TABLET | Freq: Once | ORAL | Status: AC | PRN
  Administered 2015-11-11: 5 mg via ORAL

## 2015-11-11 MED ORDER — BUPIVACAINE-EPINEPHRINE (PF) 0.25% -1:200000 IJ SOLN
INTRAMUSCULAR | Status: AC
  Filled 2015-11-11: qty 30

## 2015-11-11 MED ORDER — MIDAZOLAM HCL 2 MG/2ML IJ SOLN
1.0000 mg | INTRAMUSCULAR | Status: DC | PRN
  Administered 2015-11-11: 2 mg via INTRAVENOUS

## 2015-11-11 MED ORDER — PHENYLEPHRINE HCL 10 MG/ML IJ SOLN
INTRAMUSCULAR | Status: AC
  Filled 2015-11-11: qty 1

## 2015-11-11 MED ORDER — OXYCODONE HCL 5 MG PO TABS
ORAL_TABLET | ORAL | Status: AC
  Filled 2015-11-11: qty 1

## 2015-11-11 MED ORDER — BUPIVACAINE-EPINEPHRINE (PF) 0.5% -1:200000 IJ SOLN
INTRAMUSCULAR | Status: AC
  Filled 2015-11-11: qty 30

## 2015-11-11 MED ORDER — ONDANSETRON HCL 4 MG/2ML IJ SOLN
INTRAMUSCULAR | Status: AC
  Filled 2015-11-11: qty 2

## 2015-11-11 MED ORDER — METHYLENE BLUE 0.5 % INJ SOLN
INTRAVENOUS | Status: AC
  Filled 2015-11-11: qty 10

## 2015-11-11 MED ORDER — MIDAZOLAM HCL 5 MG/5ML IJ SOLN
INTRAMUSCULAR | Status: DC | PRN
  Administered 2015-11-11 (×2): 1 mg via INTRAVENOUS

## 2015-11-11 MED ORDER — BUPIVACAINE-EPINEPHRINE (PF) 0.5% -1:200000 IJ SOLN
INTRAMUSCULAR | Status: DC | PRN
  Administered 2015-11-11: 30 mL via PERINEURAL

## 2015-11-11 MED ORDER — LIDOCAINE-EPINEPHRINE (PF) 1 %-1:200000 IJ SOLN
INTRAMUSCULAR | Status: AC
  Filled 2015-11-11: qty 30

## 2015-11-11 MED ORDER — LIDOCAINE HCL (CARDIAC) 20 MG/ML IV SOLN
INTRAVENOUS | Status: AC
  Filled 2015-11-11: qty 5

## 2015-11-11 SURGICAL SUPPLY — 73 items
ADH SKN CLS APL DERMABOND .7 (GAUZE/BANDAGES/DRESSINGS) ×1
APPLIER CLIP 9.375 MED OPEN (MISCELLANEOUS)
APR CLP MED 9.3 20 MLT OPN (MISCELLANEOUS)
BINDER BREAST LRG (GAUZE/BANDAGES/DRESSINGS) IMPLANT
BINDER BREAST MEDIUM (GAUZE/BANDAGES/DRESSINGS) IMPLANT
BINDER BREAST XLRG (GAUZE/BANDAGES/DRESSINGS) ×2 IMPLANT
BINDER BREAST XXLRG (GAUZE/BANDAGES/DRESSINGS) IMPLANT
BLADE SURG 10 STRL SS (BLADE) ×3 IMPLANT
BLADE SURG 15 STRL LF DISP TIS (BLADE) ×1 IMPLANT
BLADE SURG 15 STRL SS (BLADE) ×3
BNDG COHESIVE 4X5 TAN STRL (GAUZE/BANDAGES/DRESSINGS) ×3 IMPLANT
CANISTER SUC SOCK COL 7IN (MISCELLANEOUS) IMPLANT
CANISTER SUCT 1200ML W/VALVE (MISCELLANEOUS) ×3 IMPLANT
CHLORAPREP W/TINT 26ML (MISCELLANEOUS) ×3 IMPLANT
CLIP APPLIE 9.375 MED OPEN (MISCELLANEOUS) IMPLANT
CLIP TI LARGE 6 (CLIP) ×3 IMPLANT
CLIP TI MEDIUM 6 (CLIP) ×6 IMPLANT
CLIP TI WIDE RED SMALL 6 (CLIP) IMPLANT
CLOSURE WOUND 1/2 X4 (GAUZE/BANDAGES/DRESSINGS) ×1
COVER MAYO STAND STRL (DRAPES) ×3 IMPLANT
COVER PROBE W GEL 5X96 (DRAPES) ×3 IMPLANT
DECANTER SPIKE VIAL GLASS SM (MISCELLANEOUS) IMPLANT
DERMABOND ADVANCED (GAUZE/BANDAGES/DRESSINGS) ×2
DERMABOND ADVANCED .7 DNX12 (GAUZE/BANDAGES/DRESSINGS) ×1 IMPLANT
DEVICE DUBIN W/COMP PLATE 8390 (MISCELLANEOUS) ×3 IMPLANT
DRAPE UTILITY XL STRL (DRAPES) ×3 IMPLANT
DRSG PAD ABDOMINAL 8X10 ST (GAUZE/BANDAGES/DRESSINGS) IMPLANT
ELECT COATED BLADE 2.86 ST (ELECTRODE) ×3 IMPLANT
ELECT REM PT RETURN 9FT ADLT (ELECTROSURGICAL) ×3
ELECTRODE REM PT RTRN 9FT ADLT (ELECTROSURGICAL) ×1 IMPLANT
GLOVE BIO SURGEON STRL SZ 6 (GLOVE) ×3 IMPLANT
GLOVE BIOGEL PI IND STRL 6.5 (GLOVE) ×1 IMPLANT
GLOVE BIOGEL PI IND STRL 7.0 (GLOVE) IMPLANT
GLOVE BIOGEL PI IND STRL 7.5 (GLOVE) IMPLANT
GLOVE BIOGEL PI INDICATOR 6.5 (GLOVE) ×2
GLOVE BIOGEL PI INDICATOR 7.0 (GLOVE) ×2
GLOVE BIOGEL PI INDICATOR 7.5 (GLOVE) ×2
GLOVE SURG SS PI 7.0 STRL IVOR (GLOVE) ×2 IMPLANT
GOWN STRL REUS W/ TWL LRG LVL3 (GOWN DISPOSABLE) ×1 IMPLANT
GOWN STRL REUS W/TWL 2XL LVL3 (GOWN DISPOSABLE) ×3 IMPLANT
GOWN STRL REUS W/TWL LRG LVL3 (GOWN DISPOSABLE) ×3
ILLUMINATOR WAVEGUIDE N/F (MISCELLANEOUS) IMPLANT
KIT MARKER MARGIN INK (KITS) ×3 IMPLANT
LIGHT WAVEGUIDE WIDE FLAT (MISCELLANEOUS) ×3 IMPLANT
NDL HYPO 25X1 1.5 SAFETY (NEEDLE) ×1 IMPLANT
NDL SAFETY ECLIPSE 18X1.5 (NEEDLE) IMPLANT
NEEDLE HYPO 18GX1.5 SHARP (NEEDLE)
NEEDLE HYPO 25X1 1.5 SAFETY (NEEDLE) ×3 IMPLANT
NS IRRIG 1000ML POUR BTL (IV SOLUTION) ×3 IMPLANT
PACK BASIN DAY SURGERY FS (CUSTOM PROCEDURE TRAY) ×3 IMPLANT
PACK UNIVERSAL I (CUSTOM PROCEDURE TRAY) ×3 IMPLANT
PENCIL BUTTON HOLSTER BLD 10FT (ELECTRODE) ×3 IMPLANT
SLEEVE SCD COMPRESS KNEE MED (MISCELLANEOUS) ×3 IMPLANT
SPONGE GAUZE 4X4 12PLY STER LF (GAUZE/BANDAGES/DRESSINGS) ×3 IMPLANT
SPONGE LAP 18X18 X RAY DECT (DISPOSABLE) ×6 IMPLANT
STAPLER VISISTAT 35W (STAPLE) IMPLANT
STOCKINETTE IMPERVIOUS LG (DRAPES) ×3 IMPLANT
STRIP CLOSURE SKIN 1/2X4 (GAUZE/BANDAGES/DRESSINGS) ×2 IMPLANT
SUT ETHILON 2 0 FS 18 (SUTURE) IMPLANT
SUT MNCRL AB 4-0 PS2 18 (SUTURE) ×3 IMPLANT
SUT MON AB 5-0 PS2 18 (SUTURE) IMPLANT
SUT SILK 2 0 SH (SUTURE) IMPLANT
SUT VIC AB 2-0 SH 27 (SUTURE) ×3
SUT VIC AB 2-0 SH 27XBRD (SUTURE) ×1 IMPLANT
SUT VIC AB 3-0 SH 27 (SUTURE) ×6
SUT VIC AB 3-0 SH 27X BRD (SUTURE) ×1 IMPLANT
SUT VIC AB 5-0 PS2 18 (SUTURE) IMPLANT
SYR CONTROL 10ML LL (SYRINGE) ×3 IMPLANT
TOWEL OR 17X24 6PK STRL BLUE (TOWEL DISPOSABLE) ×3 IMPLANT
TOWEL OR NON WOVEN STRL DISP B (DISPOSABLE) ×3 IMPLANT
TUBE CONNECTING 20'X1/4 (TUBING) ×1
TUBE CONNECTING 20X1/4 (TUBING) ×2 IMPLANT
YANKAUER SUCT BULB TIP NO VENT (SUCTIONS) ×3 IMPLANT

## 2015-11-11 NOTE — Discharge Instructions (Addendum)
[facility] °Office Phone Number [phone] ° °BREAST BIOPSY/ PARTIAL MASTECTOMY: POST OP INSTRUCTIONS ° °Always review your discharge instruction sheet given to you by the facility where your surgery was performed. ° °IF YOU HAVE DISABILITY OR FAMILY LEAVE FORMS, YOU MUST BRING THEM TO THE OFFICE FOR PROCESSING.  DO NOT GIVE THEM TO YOUR DOCTOR. ° °1. A prescription for pain medication may be given to you upon discharge.  Take your pain medication as prescribed, if needed.  If narcotic pain medicine is not needed, then you may take acetaminophen (Tylenol) or ibuprofen (Advil) as needed. °2. Take your usually prescribed medications unless otherwise directed °3. If you need a refill on your pain medication, please contact your pharmacy.  They will contact our office to request authorization.  Prescriptions will not be filled after 5pm or on week-ends. °4. You should eat very light the first 24 hours after surgery, such as soup, crackers, pudding, etc.  Resume your normal diet the day after surgery. °5. Most patients will experience some swelling and bruising in the breast.  Ice packs and a good support bra will help.  Swelling and bruising can take several days to resolve.  °6. It is common to experience some constipation if taking pain medication after surgery.  Increasing fluid intake and taking a stool softener will usually help or prevent this problem from occurring.  A mild laxative (Milk of Magnesia or Miralax) should be taken according to package directions if there are no bowel movements after 48 hours. °7. Unless discharge instructions indicate otherwise, you may remove your bandages 48 hours after surgery, and you may shower at that time.  You may have steri-strips (small skin tapes) in place directly over the incision.  These strips should be left on the skin for 7-10 days.   Any sutures or staples will be removed at the office during your follow-up visit. °8. ACTIVITIES:  You may resume  regular daily activities (gradually increasing) beginning the next day.  Wearing a good support bra or sports bra (or the breast binder) minimizes pain and swelling.  You may have sexual intercourse when it is comfortable. °a. You may drive when you no longer are taking prescription pain medication, you can comfortably wear a seatbelt, and you can safely maneuver your car and apply brakes. °b. RETURN TO WORK:  __________1 week_______________ °9. You should see your doctor in the office for a follow-up appointment approximately two weeks after your surgery.  Your doctor’s nurse will typically make your follow-up appointment when she calls you with your pathology report.  Expect your pathology report 2-3 business days after your surgery.  You may call to check if you do not hear from us after three days. ° ° °WHEN TO CALL YOUR DOCTOR: °1. Fever over 101.0 °2. Nausea and/or vomiting. °3. Extreme swelling or bruising. °4. Continued bleeding from incision. °5. Increased pain, redness, or drainage from the incision. ° °The clinic staff is available to answer your questions during regular business hours.  Please don’t hesitate to call and ask to speak to one of the nurses for clinical concerns.  If you have a medical emergency, go to the nearest emergency room or call 911.  A surgeon from [facility] is always on call at the hospital. ° °For further questions, please visit [email or web address]  ° ° ° °Post Anesthesia Home Care Instructions ° °Activity: °Get plenty of rest for the remainder of the day. A responsible adult should stay with you for   24 hours following the procedure.  °For the next 24 hours, DO NOT: °-Drive a car °-Operate machinery °-Drink alcoholic beverages °-Take any medication unless instructed by your physician °-Make any legal decisions or sign important papers. ° °Meals: °Start with liquid foods such as gelatin or soup. Progress to regular foods as tolerated. Avoid greasy, spicy,  heavy foods. If nausea and/or vomiting occur, drink only clear liquids until the nausea and/or vomiting subsides. Call your physician if vomiting continues. ° °Special Instructions/Symptoms: °Your throat may feel dry or sore from the anesthesia or the breathing tube placed in your throat during surgery. If this causes discomfort, gargle with warm salt water. The discomfort should disappear within 24 hours. ° °If you had a scopolamine patch placed behind your ear for the management of post- operative nausea and/or vomiting: ° °1. The medication in the patch is effective for 72 hours, after which it should be removed.  Wrap patch in a tissue and discard in the trash. Wash hands thoroughly with soap and water. °2. You may remove the patch earlier than 72 hours if you experience unpleasant side effects which may include dry mouth, dizziness or visual disturbances. °3. Avoid touching the patch. Wash your hands with soap and water after contact with the patch. °  ° °Regional Anesthesia Blocks ° °1. Numbness or the inability to move the "blocked" extremity may last from 3-48 hours after placement. The length of time depends on the medication injected and your individual response to the medication. If the numbness is not going away after 48 hours, call your surgeon. ° °2. The extremity that is blocked will need to be protected until the numbness is gone and the  Strength has returned. Because you cannot feel it, you will need to take extra care to avoid injury. Because it may be weak, you may have difficulty moving it or using it. You may not know what position it is in without looking at it while the block is in effect. ° °3. For blocks in the legs and feet, returning to weight bearing and walking needs to be done carefully. You will need to wait until the numbness is entirely gone and the strength has returned. You should be able to move your leg and foot normally before you try and bear weight or walk. You will need  someone to be with you when you first try to ensure you do not fall and possibly risk injury. ° °4. Bruising and tenderness at the needle site are common side effects and will resolve in a few days. ° °5. Persistent numbness or new problems with movement should be communicated to the surgeon or the [facility] ([phone])/ [facility] ([phone]). ° °

## 2015-11-11 NOTE — Progress Notes (Signed)
Assisted Dr. Crews with right, ultrasound guided, pectoralis block. Side rails up, monitors on throughout procedure. See vital signs in flow sheet. Tolerated Procedure well. 

## 2015-11-11 NOTE — Anesthesia Postprocedure Evaluation (Signed)
Anesthesia Post Note  Patient: Caitlin [name]  Procedure(s) Performed: Procedure(s) (LRB): RIGHT BREAST RADIOACTIVE SEED GUIDED  LUMPECTOMY AND RADIOACTIVE SEED TARGETED SENTINEL LYMPH NODE BIOPSY (Right)  Patient location during evaluation: PACU Anesthesia Type: General and Regional Level of consciousness: awake and alert Pain management: pain level controlled Vital Signs Assessment: post-procedure vital signs reviewed and stable Respiratory status: spontaneous breathing, nonlabored ventilation, respiratory function stable and patient connected to nasal cannula oxygen Cardiovascular status: blood pressure returned to baseline and stable Postop Assessment: no signs of nausea or vomiting Anesthetic complications: no    Last Vitals:  Filed Vitals:   11/11/15 1330 11/11/15 1345  BP: 125/65 123/63  Pulse: 87 93  Temp:    Resp: 15 24    Last Pain:  Filed Vitals:   11/11/15 1358  PainSc: 3                  Chynah Orihuela J

## 2015-11-11 NOTE — Interval H&P Note (Signed)
History and Physical Interval Note:  11/11/2015 11:35 AM  Caitlin [name]  has presented today for surgery, with the diagnosis of RIGHT UPPER QUADRANT BREAST CANCER  The various methods of treatment have been discussed with the patient and family. After consideration of risks, benefits and other options for treatment, the patient has consented to  Procedure(s): RIGHT BREAST RADIOACTIVE SEED GUIDED  LUMPECTOMY AND RADIOACTIVE SEED TARGETED SENTINEL LYMPH NODE BIOPSY (Right) as a surgical intervention .  The patient's history has been reviewed, patient examined, no change in status, stable for surgery.  I have reviewed the patient's chart and labs.  Questions were answered to the patient's satisfaction.     Alessandria Henken

## 2015-11-11 NOTE — Anesthesia Procedure Notes (Addendum)
Anesthesia Regional Block:  Pectoralis block  Pre-Anesthetic Checklist: ,, timeout performed, Correct Patient, Correct Site, Correct Laterality, Correct Procedure, Correct Position, site marked, Risks and benefits discussed,  Surgical consent,  Pre-op evaluation,  At surgeon's request and post-op pain management  Laterality: Right and Upper  Prep: chloraprep       Needles:  Injection technique: Single-shot  Needle Type: Echogenic Needle     Needle Length: 9cm 9 cm Needle Gauge: 21 and 21 G    Additional Needles:  Procedures: ultrasound guided (picture in chart) Pectoralis block Narrative:  Start time: 11/11/2015 11:08 AM End time: 11/11/2015 11:13 AM Injection made incrementally with aspirations every 5 mL.  Performed by: Personally  Anesthesiologist: CREWS, DAVID   Procedure Name: LMA Insertion Date/Time: 11/11/2015 11:52 AM Performed by: Lyndee Leo Pre-anesthesia Checklist: Patient identified, Emergency Drugs available, Suction available, Patient being monitored and Timeout performed Patient Re-evaluated:Patient Re-evaluated prior to inductionOxygen Delivery Method: Circle System Utilized Preoxygenation: Pre-oxygenation with 100% oxygen Intubation Type: IV induction Ventilation: Mask ventilation without difficulty LMA: LMA inserted LMA Size: 4.0 Number of attempts: 1 Airway Equipment and Method: Bite block Placement Confirmation: positive ETCO2 Tube secured with: Tape Dental Injury: Teeth and Oropharynx as per pre-operative assessment       Right PEC block image

## 2015-11-11 NOTE — H&P (Signed)
Colon [name]. Schuur 11/02/2015 1:26 PM Location: [facility] Patient #: [id number] DOB: 04-28-1946 Married / Language: English / Race: White Female   History of Present Illness Stark Klein MD; 11/02/2015 2:35 PM) The patient is a 70 year old female who presents with breast cancer. The patient is a 70 year old female who presented with a screening detected right breast mass on mammography. She was seen to have an irregular spiculated mass at 1:00 that is 6 cm in greatest dimension. There was also what appeared to be an abnormal lymph node in the right axilla that was 1.3 cm in greatest dimension. Both of these were biopsied. The breast demonstrated invasive ductal carcinoma, grade 1. The axilla was negative, however the biopsy did not definitively get lymphatic tissue. Prognostic panel has not yet been completed.  She does have an significant family history with colorectal cancer in her father as well as prostate cancer in her father. A paternal grandfather had pancreatic cancer and a maternal grandmother had breast cancer.   Diagnostic mammogram/ultrasound  FINDINGS: Spot compression CC and MLO tomosynthesis was performed of the bilateral breasts. In addition, ML tomosynthesis was performed of the left breast. There is a mass in the central to slightly inner right breast with spiculated margin measuring approximately 4-5 mm. The initially questioned mass seen in the upper-outer far posterior left breast demonstrates imaging features consistent with a small intramammary/ low axillary lymph node. Targeted ultrasound of the right breast was performed demonstrating an irregular spiculated hypoechoic mass at 1 o'clock 5 cm from nipple measuring 0.4 x 0.6 x 0.4 cm. This corresponds with the mass seen at mammography. In addition, a lymph node with a somewhat nodular cortex is seen in the low right axilla measuring approximately 1.2 x 0.5 x 1.3 cm.  IMPRESSION: 1. Suspicious mass  in the right breast at the approximate 1 o'clock location.  2. Lymph node in the low right axilla with cortex nodularity.  Pathology 1. Breast, right, needle core biopsy, 1:00 - INVASIVE DUCTAL CARCINOMA, SEE COMMENT. 2. Lymph node, needle/core biopsy, right axillary - BENIGN ADIPOSE TISSUE. - NO LYMPHOID TISSUE. - NO MALIGNANCY.   Allergies Elbert Ewings, [place]; 11/02/2015 1:30 PM) Tetanus Toxoid Adsorbed *Toxoids**  Medication History Elbert Ewings, [place]; 11/02/2015 1:34 PM) Calcium Citrate (950MG  Tablet, Oral) Active. Docusate Sodium (100MG  Tablet, Oral) Active. Milk Thistle (175MG  Capsule, Oral) Active. Dextromethorphan-GG (30-600MG  Tablet ER, Oral) Active. Pravastatin Sodium (40MG  Tablet, Oral) Active. Magnesium Oxide (400MG  Tablet, Oral) Active. Probiotic (Oral) Active. Vitamin B12 (1000MCG Tablet ER, Oral) Active. Multiple Vitamin (Oral) Active. NexIUM (40MG  Capsule DR, Oral) Active. Budesonide-Formoterol Fumarate (160-4.5MCG/ACT Aerosol, Inhalation) Active. Famotidine (20MG  Tablet, Oral) Active. Telmisartan-HCTZ (80-25MG  Tablet, Oral) Active.    Review of Systems Stark Klein MD; 11/02/2015 2:31 PM) All other systems negative  Vitals Elbert Ewings CMA; 11/02/2015 1:30 PM) 11/02/2015 1:30 PM Weight: 185 lb Height: 63in Body Surface Area: 1.87 m Body Mass Index: 32.77 kg/m  Temp.: 98.2F  Pulse: 57 (Regular)  BP: 124/76 (Sitting, Left Arm, Standard)       Physical Exam Stark Klein MD; 11/02/2015 2:33 PM) General Mental Status-Alert. General Appearance-Consistent with stated age. Hydration-Well hydrated. Voice-Normal.  Head and Neck Head-normocephalic, atraumatic with no lesions or palpable masses. Trachea-midline. Thyroid Gland Characteristics - normal size and consistency.  Eye Eyeball - Bilateral-Extraocular movements intact. Sclera/Conjunctiva - Bilateral-No scleral icterus.  Chest and Lung Exam Chest and lung  exam reveals -quiet, even and easy respiratory effort with no use of accessory muscles and on auscultation,  normal breath sounds, no adventitious sounds and normal vocal resonance. Inspection Chest Wall - Normal. Back - normal.  Breast Note: Breasts are ptotic bilaterally. Bruising on upper inner right breast. lumpectomy scars on left breast. no palpable masses or LAD. no skin dimpling. no nipple retraction or discharge.   Cardiovascular Cardiovascular examination reveals -normal heart sounds, regular rate and rhythm with no murmurs and normal pedal pulses bilaterally.  Abdomen Inspection Inspection of the abdomen reveals - No Hernias. Palpation/Percussion Palpation and Percussion of the abdomen reveal - Soft, Non Tender, No Rebound tenderness, No Rigidity (guarding) and No hepatosplenomegaly. Auscultation Auscultation of the abdomen reveals - Bowel sounds normal. Note: transverse scar from hernia repair.   Neurologic Neurologic evaluation reveals -alert and oriented x 3 with no impairment of recent or remote memory. Mental Status-Normal.  Musculoskeletal Global Assessment -Note: no gross deformities.  Normal Exam - Left-Upper Extremity Strength Normal and Lower Extremity Strength Normal. Normal Exam - Right-Upper Extremity Strength Normal and Lower Extremity Strength Normal.  Lymphatic Head & Neck  General Head & Neck Lymphatics: Bilateral - Description - Normal. Axillary  General Axillary Region: Bilateral - Description - Normal. Tenderness - Non Tender. Femoral & Inguinal  Generalized Femoral & Inguinal Lymphatics: Bilateral - Description - No Generalized lymphadenopathy.    Assessment & Plan Stark Klein MD; 11/02/2015 2:37 PM) PRIMARY CANCER OF UPPER INNER QUADRANT OF RIGHT FEMALE BREAST (C50.211) Impression: Patient has a new diagnosis of a clinical T1b N0 right breast cancer. This does appear to be low-grade. It would be very unlikely for the lymph  node to be positive. This likely reflects a benign finding. We will discuss this patient at multidisciplinary conference. Patient is a good candidate for lumpectomy. I will set her up for a CT localized lumpectomy with targeted lymph node dissection. She did have the area in her axilla clipped, and I would like to resect that nodule, whatever it is. I discussed that we would know more about additional treatment once the prognostic panel is back, but she would most likely receive radiation and probably an anti-hormone pill. I discussed that there is a possibility for chemotherapy depending on multiple factors from her surgery. I discussed the risk of bleeding, infection, seroma, hematoma, possible risk with additional surgeries or procedures, possible her lung complications, possible chronic breast pain.  The patient would like to proceed with this as soon as possible.  45 min spent in evaluation, examination, counseling, and coordination of care. >50% spent in counseling. Current Plans Referred to Radiation Oncology, for evaluation and follow up (Radiation Oncology). Routine. Referred to Oncology, for evaluation and follow up (Oncology). Routine. Pt Education - flb breast cancer surgery: discussed with patient and provided information. You are being scheduled for surgery - Our schedulers will call you.  You should hear from our office's scheduling department within 5 working days about the location, date, and time of surgery. We try to make accommodations for patient's preferences in scheduling surgery, but sometimes the OR schedule or the surgeon's schedule prevents [place] from making those accommodations.  If you have not heard from our office [phone]) in 5 working days, call the office and ask for your surgeon's nurse.  If you have other questions about your diagnosis, plan, or surgery, call the office and ask for your surgeon's nurse.

## 2015-11-11 NOTE — Transfer of Care (Signed)
Immediate Anesthesia Transfer of Care Note  Patient: Caitlin [name]  Procedure(s) Performed: Procedure(s): RIGHT BREAST RADIOACTIVE SEED GUIDED  LUMPECTOMY AND RADIOACTIVE SEED TARGETED SENTINEL LYMPH NODE BIOPSY (Right)  Patient Location: PACU  Anesthesia Type:GA combined with regional for post-op pain  Level of Consciousness: awake and patient cooperative  Airway & Oxygen Therapy: Patient Spontanous Breathing and Patient connected to face mask oxygen  Post-op Assessment: Report given to RN and Post -op Vital signs reviewed and stable  Post vital signs: Reviewed and stable  Last Vitals:  Filed Vitals:   11/11/15 1318 11/11/15 1319  BP: 119/85   Pulse: 96 96  Temp:    Resp:  16    Complications: No apparent anesthesia complications

## 2015-11-11 NOTE — Op Note (Signed)
Right Breast Radioactive seed localized lumpectomy and and seed targeted sentinel lymph node biopsy  Indications: This patient presents with history of right breast cancer, cT1N0, upper inner quadrant  Pre-operative Diagnosis: Right breast cancer  Post-operative Diagnosis: Same  Surgeon: Stark Klein   Anesthesia: General endotracheal anesthesia  ASA Class: 1  Procedure Details  The patient was seen in the Holding Room. The risks, benefits, complications, treatment options, and expected outcomes were discussed with the patient. The possibilities of bleeding, infection, the need for additional procedures, failure to diagnose a condition, and creating a complication requiring transfusion or operation were discussed with the patient. The patient concurred with the proposed plan, giving informed consent.  The site of surgery properly noted/marked. The patient was taken to Operating Room # 8, identified, and the procedure verified as Right seed localized Lumpectomy and seed targeted SLN bx. A Time Out was held and the above information confirmed.  The right arm, breast, and chest were prepped and draped in standard fashion. The lumpectomy was performed by creating an transverse incision over the upper inner quadrant of the breast over the previously placed radioactive seed.  Dissection was carried down to around the point of maximum signal intensity. The cautery was used to perform the dissection.  Hemostasis was achieved with cautery. The edges of the cavity were marked with large clips, with one each medial, lateral, inferior and superior, and two clips posteriorly.   The specimen was inked with the margin marker paint kit.    Specimen radiography confirmed inclusion of the mammographic lesion, the clip, and the seed.  Large clips were placed in the breast cavity.  The background signal in the breast was zero.  The wound was irrigated and closed with 3-0 vicryl in layers and 4-0 monocryl subcuticular  suture.    Using a hand-held gamma probe, right axillary sentinel nodes were identified transcutaneously.  An oblique incision was created below the axillary hairline.  Dissection was carried through the clavipectoral fascia.  The node with the seed was immediately apparent as it was firm and more superficial.  A specimen radiograph was taken to confirm the seed in the first LN.  Two additional level 2 LN were removed.   Counts per second were 40 and 15.    The background count was 0 cps.  The wound was irrigated.  Hemostasis was achieved with cautery.  The axillary incision was closed with a 3-0 vicryl deep dermal interrupted sutures and a 4-0 monocryl subcuticular closure.    Sterile dressings were applied. At the end of the operation, all sponge, instrument, and needle counts were correct.  Findings: grossly clear surgical margins and no adenopathy.  Posterior margin is pectoralis and anterior margin is skin.    Estimated Blood Loss:  min         Specimens: Right breast lumpectomy and three axillary sentinel lymph nodes.             Complications:  None; patient tolerated the procedure well.         Disposition: PACU - hemodynamically stable.         Condition: stable

## 2015-11-11 NOTE — Anesthesia Preprocedure Evaluation (Signed)
Anesthesia Evaluation  Patient identified by MRN, date of birth, ID band Patient awake    Reviewed: Allergy & Precautions, NPO status , Patient's Chart, lab work & pertinent test results  Airway Mallampati: I  TM Distance: >3 FB Neck ROM: Full    Dental  (+) Teeth Intact, Dental Advisory Given   Pulmonary asthma ,    breath sounds clear to auscultation       Cardiovascular hypertension, Pt. on medications  Rhythm:Regular Rate:Normal     Neuro/Psych    GI/Hepatic GERD  Medicated and Controlled,  Endo/Other    Renal/GU      Musculoskeletal   Abdominal   Peds  Hematology   Anesthesia Other Findings   Reproductive/Obstetrics                             Anesthesia Physical Anesthesia Plan  ASA: I  Anesthesia Plan: General   Post-op Pain Management:    Induction: Intravenous  Airway Management Planned: LMA  Additional Equipment:   Intra-op Plan:   Post-operative Plan: Extubation in OR  Informed Consent: I have reviewed the patients History and Physical, chart, labs and discussed the procedure including the risks, benefits and alternatives for the proposed anesthesia with the patient or authorized representative who has indicated his/her understanding and acceptance.   Dental advisory given  Plan Discussed with: Anesthesiologist, CRNA and Surgeon  Anesthesia Plan Comments:         Anesthesia Quick Evaluation

## 2015-11-12 ENCOUNTER — Encounter (HOSPITAL_BASED_OUTPATIENT_CLINIC_OR_DEPARTMENT_OTHER): Payer: Self-pay | Admitting: General Surgery

## 2015-11-16 ENCOUNTER — Encounter (HOSPITAL_COMMUNITY): Payer: Medicare Other | Attending: Hematology & Oncology | Admitting: Hematology & Oncology

## 2015-11-16 ENCOUNTER — Encounter (HOSPITAL_COMMUNITY): Payer: Self-pay | Admitting: Hematology & Oncology

## 2015-11-16 VITALS — BP 131/59 | HR 109 | Temp 98.4°F | Resp 18 | Wt 187.5 lb

## 2015-11-16 DIAGNOSIS — C50211 Malignant neoplasm of upper-inner quadrant of right female breast: Secondary | ICD-10-CM | POA: Diagnosis not present

## 2015-11-16 DIAGNOSIS — Z170 Estrogen receptor positive status [ER+]: Secondary | ICD-10-CM | POA: Diagnosis not present

## 2015-11-16 NOTE — Progress Notes (Signed)
[place]  Progress Note  Patient Care Team: Caitlin Sites, MD as PCP - General  CHIEF COMPLAINTS/PURPOSE OF CONSULTATION:  pT1cN0M0 ER+ PR+ HER 2 neu - carcinoma of the R breast Lumpectomy/sentinel node biopsy with Dr. Barry [name] on 11/11/2015    Breast cancer of upper-inner quadrant of right female breast ([place])   10/06/2015 Imaging Screening bilateral mammogram BI-RADS Category 0, R breast possible mass, L breast possible mass   10/20/2015 Imaging Diagnostic B mammogram, R breast Ultrasound with suspicious mass in the R breast at the 1 o clock location 5 cm from the nipple, LN in the low R axillae with cortex nodularity   10/27/2015 Initial Biopsy Ultrasound guided biopsy of R axillary LN, BENIGN. Ultrasound guided biopsy of R breast with invasive ductal carcinoma   11/04/2015 Initial Diagnosis Breast cancer of upper-inner quadrant of right female breast ([place])   11/09/2015 Procedure Radioactive seed localization R breast   11/11/2015 Surgery R breast radioactive seed localized lumpectomy and seed targeted sentinel LN biopsy   11/11/2015 Pathology Results Invasive ductal carcinoma Grade I/III spanning 1.1 cm, lobular neoplasia (LCIS) resection margins negative. 3 negative sentinel nodes ER (100%), PR (100%), Her2 neu negative pT1c, pN0     HISTORY OF PRESENTING ILLNESS:  Caitlin [name] 70 y.o. female is here because of Stage I infiltrating ductal carcinoma with associated LCIS,  ER+ PR+ and HER2-. She has undergone definitive surgical therapy with Dr. Barry [name]. Screening mammogram on 10/06/3015 showed a mass in the inner quadrant of the R breast measuring 5 mm. Ultrasound confirmed this mass and also a LN in the R axillae measuring 1.3 cm. Abnormality in the L breast was consistent with a normal intramammary LN. Biopsy of the R LN was benign, sentinel node procedure was recommended.   Caitlin [name] was here with her husband Caitlin [name]. She had definitive surgical therapy with Dr. Barry [name] last  Wednesday 4/12. She notes that she still has a lot of questions about her  diagnosis. She notes no significant problems since surgery.  Final surgical pathology revealed total tumor dimension of 1.1cm with clear margins, 3 sentinel nodes were all negative for disease.    Her PCP is Caitlin [name].   She is up to date on her colonoscopy. She has a history of a sleeve gastrectomy in 11/2013 at [facility]. She is currently post-menopausal. She never took HRT.  MEDICAL HISTORY:  Past Medical History  Diagnosis Date  . Asthma   . Hyperlipidemia   . Impaired fasting glucose   . Hypertension   . Breast mass     benign  . GERD (gastroesophageal reflux disease)   . History of hiatal hernia     SURGICAL HISTORY: Past Surgical History  Procedure Laterality Date  . Cholecystectomy  09/08/2010  . Vaginal hysterectomy  1980s  . Tonsillectomy  1968  . Laser ablation of vascular lesion  2007/2008  . Colonoscopy    . Upper gastrointestinal endoscopy    . Colonoscopy N/A 09/25/2013    Procedure: COLONOSCOPY;  Surgeon: Caitlin Houston, MD;  [facility];  Service: Endoscopy;  Laterality: N/A;  100-moved to 1200 Ann to notify pt  . Laparoscopic gastric sleeve resection  11-2013    at [facility]  . Hernia repair  12/24/2010, [id number]  . Breast surgery Left     breast biopies x4  . Breast lumpectomy with radioactive seed and sentinel lymph node biopsy Right 11/11/2015    Procedure: RIGHT BREAST RADIOACTIVE SEED GUIDED  LUMPECTOMY  AND RADIOACTIVE SEED TARGETED SENTINEL LYMPH NODE BIOPSY;  Surgeon: Caitlin Klein, MD;  Location: [facility];  Service: General;  Laterality: Right;    SOCIAL HISTORY: Social History   Social History  . Marital Status: Married    Spouse Name: N/A  . Number of Children: 2  . Years of Education: N/A   Occupational History  . retired     Pharmacist, hospital   Social History Main Topics  . Smoking status: Never Smoker   . Smokeless tobacco: Never Used  . Alcohol Use:  Yes     Comment: occasional wine  . Drug Use: No  . Sexual Activity: Not on file   Other Topics Concern  . Not on file   Social History Narrative  Married for 44 years. 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  From [place], [place] Father was 86 when he passed had rectal/colon cancer when he was 68 years old. Died from congestive heart failure. Mother was 8 when she passed; had dementia 3 sisters-Older sister had health problems Maternal Grandmother had breast cancer. Natural Menopause during late 48's  FAMILY HISTORY: Family History  Problem Relation Age of Onset  . Colon cancer Father   . Rectal cancer Father   . Prostate cancer Father   . Dementia Mother   . Osteoporosis Mother   . Pancreatic cancer Paternal Grandfather   . Heart disease Paternal Grandmother   . Heart disease Maternal Grandfather   . Breast cancer Maternal Grandmother   . Healthy Daughter   . Healthy Son     ALLERGIES:  is allergic to tetanus toxoids.  MEDICATIONS:  Current Outpatient Prescriptions  Medication Sig Dispense Refill  . acidophilus (RISAQUAD) CAPS capsule Take 1 capsule by mouth daily.    . B Complex Vitamins (B-COMPLEX/B-12 SL) Place 1,000 mcg/day under the tongue daily.    . budesonide-formoterol (SYMBICORT) 160-4.5 MCG/ACT inhaler Inhale 2 puffs into the lungs daily as needed (for shortness of breath). Take 1 puffs first thing in am and then another 1 puffs about 12 hours later    . Calcium Citrate-Vitamin D (CALCIUM CITRATE CHEWY BITE) 500-500 MG-UNIT CHEW Chew 500 mg by mouth. One soft chews twice daily    . Calcium-Vitamin D-Iron (RA CALCIUM PLUS IRON/VIT D PO) Take 1 tablet by mouth at bedtime.    . docusate sodium (COLACE) 100 MG capsule Take 100 mg by mouth daily. Daily at bedtime    . esomeprazole (NEXIUM) 20 MG capsule Take 20  mg by mouth daily at 12 noon.    . famotidine (PEPCID) 20 MG tablet Take 20 mg by mouth at bedtime.    . magnesium oxide (MAG-OX) 400 MG tablet Take 400 mg by mouth daily.    . Menaquinone-7 (VITAMIN K2) 100 MCG CAPS Take 100 mcg/day by mouth daily after supper.    [name] MILK THISTLE EXTRACT PO Take 1 tablet by mouth. 1014m    . Multiple Vitamin (MULTIVITAMIN) capsule Take 1 capsule by mouth daily.    . pravastatin (PRAVACHOL) 20 MG tablet Take 20 mg by mouth daily.    .Marland Kitchentelmisartan-hydrochlorothiazide (MICARDIS HCT) 80-25 MG per tablet Take 0.5 tablets by mouth daily.     .Marland Kitchendextromethorphan-guaiFENesin (MUCINEX DM) 30-600 MG per 12 hr tablet Take 1 tablet by mouth daily as needed for cough (and congestion). Reported on 11/16/2015    .  oxyCODONE-acetaminophen (ROXICET) 5-325 MG tablet Take 1-2 tablets by mouth every 4 (four) hours as needed for severe pain. (Patient not taking: Reported on 11/16/2015) 30 tablet 0   No current facility-administered medications for this visit.    Review of Systems  Constitutional: Negative.  Negative for fever, chills, weight loss and malaise/fatigue.  HENT: Negative.  Negative for congestion, hearing loss, nosebleeds, sore throat and tinnitus.   Eyes: Negative.  Negative for blurred vision, double vision, pain and discharge.  Respiratory: Negative.  Negative for cough, hemoptysis, sputum production, shortness of breath and wheezing.   Cardiovascular: Negative.  Negative for chest pain, palpitations, claudication, leg swelling and PND.  Gastrointestinal: Negative.  Negative for heartburn, nausea, vomiting, abdominal pain, diarrhea, constipation, blood in stool and melena.  Genitourinary: Negative.  Negative for dysuria, urgency, frequency and hematuria.  Musculoskeletal: Negative.  Negative for myalgias, joint pain and falls.  Skin: Negative.  Negative for itching and rash.  Neurological: Negative.  Negative for dizziness, tingling, tremors, sensory change, speech  change, focal weakness, seizures, loss of consciousness, weakness and headaches.  Endo/Heme/Allergies: Negative.  Does not bruise/bleed easily.  Psychiatric/Behavioral: Negative.  Negative for depression, suicidal ideas, memory loss and substance abuse. The patient is not nervous/anxious and does not have insomnia.   All other systems reviewed and are negative.  14 point ROS was done and is otherwise as detailed above or in HPI   PHYSICAL EXAMINATION: ECOG PERFORMANCE STATUS: 0 - Asymptomatic  Filed Vitals:   11/16/15 1510  BP: 131/59  Pulse: 109  Temp: 98.4 F (36.9 C)  Resp: 18   Filed Weights   11/16/15 1510  Weight: 187 lb 8 oz (85.049 kg)     Physical Exam  Constitutional: She is oriented to person, place, and time and well-developed, well-nourished, and in no distress.  Wears glasses  HENT:  Head: Normocephalic and atraumatic.  Nose: Nose normal.  Mouth/Throat: Oropharynx is clear and moist. No oropharyngeal exudate.  Eyes: Conjunctivae and EOM are normal. Pupils are equal, round, and reactive to light. Right eye exhibits no discharge. Left eye exhibits no discharge. No scleral icterus.  Neck: Normal range of motion. Neck supple. No tracheal deviation present. No thyromegaly present.  Cardiovascular: Normal rate, regular rhythm and normal heart sounds.  Exam reveals no gallop and no friction rub.   No murmur heard. Pulmonary/Chest: Effort normal and breath sounds normal. She has no wheezes. She has no rales.  Abdominal: Soft. Bowel sounds are normal. She exhibits no distension and no mass. There is no tenderness. There is no rebound and no guarding.  Musculoskeletal: Normal range of motion. She exhibits no edema.  Lymphadenopathy:    She has no cervical adenopathy.  Neurological: She is alert and oriented to person, place, and time. She has normal reflexes. No cranial nerve deficit. Gait normal. Coordination normal.  Skin: Skin is warm and dry. No rash noted.    Psychiatric: Mood, memory, affect and judgment normal.  Nursing note and vitals reviewed. R breast surgical incision site with mild bruising, well healing, no erythema, R axillary incision similar in appearance    LABORATORY DATA:  I have reviewed the data as listed Lab Results  Component Value Date   WBC 6.2 12/20/2010   HGB 12.3 12/20/2010   HCT 36.4 12/20/2010   MCV 93.8 12/20/2010   PLT 284 12/20/2010   CMP     Component Value Date/Time   NA 140 11/06/2015 0950   K 3.8 11/06/2015 0950   CL  104 11/06/2015 0950   CO2 26 11/06/2015 0950   GLUCOSE 97 11/06/2015 0950   BUN 14 11/06/2015 0950   CREATININE 0.52 11/06/2015 0950   CALCIUM 9.2 11/06/2015 0950   PROT 7.3 09/03/2010 1648   ALBUMIN 4.0 09/03/2010 1648   AST 39* 09/03/2010 1648   ALT 38* 09/03/2010 1648   ALKPHOS 92 09/03/2010 1648   BILITOT 1.0 09/03/2010 1648   GFRNONAA >60 11/06/2015 0950   GFRAA >60 11/06/2015 0950   RADIOGRAPHIC STUDIES: I have personally reviewed the radiological images as listed and agreed with the findings in the report. No results found.  Study Result     CLINICAL DATA: Patient is post ultrasound-guided radioactive seed placement within a biopsy proven malignancy over the upper inner quadrant of the right breast as well as a second radioactive seed placement within a previously biopsied right axillary node in the lower axillary region.  EXAM: DIAGNOSTIC RIGHT MAMMOGRAM POST ULTRASOUND-GUIDED RADIOACTIVE SEED PLACEMENT  COMPARISON: Previous exam(s).  FINDINGS: Mammographic images were obtained following ultrasound-guided radioactive seed placement. These demonstrate satisfactory placement of a radioactive seed within the biopsy proven malignancy at the 1 o'clock position of the right breast immediately inferior-medial to the ribbon shaped metallic clip. The second radioactive seed lies within the previously biopsied lymph node immediately anterior superior to the post  biopsy wing shaped clip in the lower right axillary region.  IMPRESSION: Appropriate location of 2 radioactive seeds.  Final Assessment: Post Procedure Mammograms for Seed Placement   Electronically Signed  By: Marin Olp M.D.  On: 11/09/2015 15:06   Study Result     CLINICAL DATA: Patient is post ultrasound-guided radioactive seed localization of a biopsy-proven malignancy over the 1 o'clock position of the right breast.  EXAM: ULTRASOUND GUIDED RADIOACTIVE SEED LOCALIZATION OF THE RIGHT BREAST  COMPARISON: Previous exam(s).  FINDINGS: Patient presents for radioactive seed localization prior to surgical excision. I met with the patient and we discussed the procedure of seed localization including benefits and alternatives. We discussed the high likelihood of a successful procedure. We discussed the risks of the procedure including infection, bleeding, tissue injury and further surgery. We discussed the low dose of radioactivity involved in the procedure. Informed, written consent was given.  The usual time-out protocol was performed immediately prior to the procedure.  Using ultrasound guidance, sterile technique, 1% lidocaine and an I-125 radioactive seed, the targeted mass/clip over the upper inner right breast was localized using a medial to lateral approach. The follow-up mammogram images confirm the seed in the expected location and were marked for Dr. Cher Nakai.  Follow-up survey of the patient confirms presence of the radioactive seed.  Order number of I-125 seed: [id number].  Total activity: 0.262 mCi Reference Date: 11/05/2015  The patient tolerated the procedure well and was released from the [facility]. She was given instructions regarding seed removal.  IMPRESSION: Radioactive seed localization right breast. No apparent complications.   Electronically Signed  By: Marin Olp M.D.  On: 11/09/2015 15:33    ADDENDUM REPORT: 10/30/2015 10:09 ADDENDUM: Pathology of the right breast biopsy revealed INVASIVE DUCTAL CARCINOMA. Microscopic Comment- while grading is best performed at the time of excision, the carcinoma appears grade 1. This was found to be concordant by Dr. Jimmye Norman. Recommendations: Surgical referral. At the patient's request, pathology and recommendations were relayed to the patient by Dr. Jimmye Norman on 10/29/15 at 5:00 PM. She stated she has done well following the biopsy with no bleeding, bruising, or hematoma. All of her questions were answered.  She was encouraged to call the imaging department of [facility] with any further questions or concerns. Jetta Lout, [facility]) contacted the patient on 10/30/15 in regards to the surgical referral. At the patient's request due to previous breast surgery by Dr. Margot Chimes, a surgical appointment was made with Dr. Barry [name] at [facility] for Monday, 11/02/15 at 1:00 PM. Date and time of the appointment was relayed to the patient. Addendum by Jetta Lout, RRA on 10/30/15. Electronically Signed  By: Curlene Dolphin M.D.  On: 10/30/2015 10:09     Study Result     CLINICAL DATA: Suspicious mass right breast at 1 o'clock position 5 cm from nipple for which biopsy was recommended.  EXAM: ULTRASOUND GUIDED RIGHT BREAST CORE NEEDLE BIOPSY  COMPARISON: Previous exam(s).  PROCEDURE: I met with the patient and we discussed the procedure of ultrasound-guided biopsy, including benefits and alternatives. We discussed the high likelihood of a successful procedure. We discussed the risks of the procedure including infection, bleeding, tissue injury, clip migration, and inadequate sampling. Informed written consent was given. The usual time-out protocol was performed immediately prior to the procedure.  Using sterile technique and 1% Lidocaine as local anesthetic, under direct  ultrasound visualization, a 12 gauge vacuum-assisted device was used to perform biopsy of a 5 mm irregular mass at 1 o'clock position 5 cm from the nipple using a lateral to medial approach. At the conclusion of the procedure, a ribbon shaped tissue marker clip was deployed into the biopsy cavity. Follow-up 2-view mammogram was performed and dictated separately.  IMPRESSION: Ultrasound-guided biopsy of the right breast. No apparent complications.  Electronically Signed: By: Curlene Dolphin M.D. On: 10/27/2015 16:46       PATHOLOGY:       ASSESSMENT & PLAN:  pT1cN0M0 ER+ PR+ HER 2 neu - carcinoma of the R breast, upper inner quadrant Lumpectomy/sentinel node biopsy with Dr. Barry [name] on 11/11/2015  We reviewed breast cancer in a general sense discussing ER, PR and Her 2 neu.  We extensively reviewed her pathology.  We discussed staging, we discussed her overall good prognosis.  Based upon her tumor size we reviewed the ONCOTYPE assay in conjunction with the NCCN guidelines.  We reviewed the timing of therapy including radiation and endocrine therapy.   I have recommended sending an ONCOTYPE assay.  We will follow-up once results are available.  For her radiation she has already met with Dr. Pablo Ledger, she would like to undergo XRT in [place] if possible because of travel distance.   We discussed issues pertaining to survivorship and will in the future refer her to survivorship clinic.   She met with Hildred Alamin our navigator today. I will see her back as detailed above.  She was encouraged to write down any questions or concerns she may have and bring them to follow-up for discussion.   No matching staging information was found for the patient.  All questions were answered. The patient knows to call the clinic with any problems, questions or concerns.  This document serves as a record of services personally performed by Ancil Linsey, MD. It was created on her behalf by Kandace Blitz, a trained medical scribe. The creation of this record is based on the scribe's personal observations and the provider's statements to them. This document has been checked and approved by the attending provider.  I have reviewed the above documentation for accuracy and completeness, and I agree with the above.  This note was electronically signed.  Molli Hazard, MD  11/16/2015 4:11 PM

## 2015-11-16 NOTE — Progress Notes (Signed)
Quick Note:  Please let patient know margins and LNs are negative. ______

## 2015-11-16 NOTE — Patient Instructions (Signed)
[place] at [facility] Discharge Instructions  RECOMMENDATIONS MADE BY THE CONSULTANT AND ANY TEST RESULTS WILL BE SENT TO YOUR REFERRING PHYSICIAN.  [place] at [facility] Discharge Instructions  RECOMMENDATIONS MADE BY THE CONSULTANT AND ANY TEST RESULTS WILL BE SENT TO YOUR REFERRING PHYSICIAN.  Exam done and seen today by Dr. Maree Krabbe you a early breast cancer book today. Discussed thoroughly the plan for you. You met Hildred Alamin today, will send off tissue piece (ONCA TYPE) for genetic testing. Will discuss these results at your next follow up appointment. Return to see the doctor in 2 weeks Please call the clinic if you have any questions or concerns  Thank you for choosing [place] at [facility] to provide your oncology and hematology care.  To afford each patient quality time with our provider, please arrive at least 15 minutes before your scheduled appointment time.   Beginning January 23rd 2017 lab work for the [facility] will be done in the  Main lab at [facility] on 1st floor. If you have a lab appointment with the [facility] please come in thru the  Main Entrance and check in at the main information desk  You need to re-schedule your appointment should you arrive 10 or more minutes late.  We strive to give you quality time with our providers, and arriving late affects you and other patients whose appointments are after yours.  Also, if you no show three or more times for appointments you may be dismissed from the clinic at the providers discretion.     Again, thank you for choosing [facility].  Our hope is that these requests will decrease the amount of time that you wait before being seen by our physicians.       _____________________________________________________________  Should you have questions after your visit to [facility], please contact our office at [phone]  [phone] between the hours of 8:30 a.m. and 4:30 p.m.  Voicemails left after 4:30 p.m. will not be returned until the following business day.  For prescription refill requests, have your pharmacy contact our office.         Resources For Cancer Patients and their Caregivers ? American Cancer Society: Can assist with transportation, wigs, general needs, runs Look Good Feel Better.        763 750 7030 ? Cancer Care: Provides financial assistance, online support groups, medication/co-pay assistance.  1-800-813-HOPE 731 051 7829) ? [place] Assists [place] cancer patients and their families through emotional , educational and financial support.  623-227-0762 ? [place] DSS Where to apply for food stamps, Medicaid and utility assistance. 438-124-3056 ? [facility]: Transportation to medical appointments. 312-527-9103 ? Social Security Administration: May apply for disability if have a Stage IV cancer. 219-857-1911 7631077952 ? [employer], Disability and Transit Services: Assists with nutrition, care and transit needs. 301-451-5906  Thank you for choosing [place] at [facility] to provide your oncology and hematology care.  To afford each patient quality time with our provider, please arrive at least 15 minutes before your scheduled appointment time.   Beginning January 23rd 2017 lab work for the [facility] will be done in the  Main lab at [facility] on 1st floor. If you have a lab appointment with the [facility] please come in thru the  Main Entrance and check in at the main information desk  You need  to re-schedule your appointment should you arrive 10 or more minutes late.  We strive to give you quality time with our providers, and arriving late affects you and other patients whose appointments are after yours.  Also, if you no show three or more times for appointments you may be dismissed from the clinic at the  providers discretion.     Again, thank you for choosing [facility].  Our hope is that these requests will decrease the amount of time that you wait before being seen by our physicians.       _____________________________________________________________  Should you have questions after your visit to [facility], please contact our office at [phone] between the hours of 8:30 a.m. and 4:30 p.m.  Voicemails left after 4:30 p.m. will not be returned until the following business day.  For prescription refill requests, have your pharmacy contact our office.         Resources For Cancer Patients and their Caregivers ? American Cancer Society: Can assist with transportation, wigs, general needs, runs Look Good Feel Better.        (936) 261-6773 ? Cancer Care: Provides financial assistance, online support groups, medication/co-pay assistance.  1-800-813-HOPE (409)719-2035) ? [facility] Assists [place] cancer patients and their families through emotional , educational and financial support.  873-730-1515 ? [place] DSS Where to apply for food stamps, Medicaid and utility assistance. 251-296-6208 ? [facility]: Transportation to medical appointments. 7344378385 ? Social Security Administration: May apply for disability if have a Stage IV cancer. 530-868-8503 305 053 8544 ? [employer], Disability and Transit Services: Assists with nutrition, care and transit needs. 443-310-0171

## 2015-11-27 ENCOUNTER — Other Ambulatory Visit (HOSPITAL_COMMUNITY): Payer: Self-pay | Admitting: *Deleted

## 2015-12-01 ENCOUNTER — Ambulatory Visit (HOSPITAL_COMMUNITY): Payer: Medicare Other | Admitting: Hematology & Oncology

## 2015-12-08 ENCOUNTER — Ambulatory Visit (HOSPITAL_COMMUNITY): Payer: Medicare Other | Admitting: Hematology & Oncology

## 2015-12-17 ENCOUNTER — Ambulatory Visit (HOSPITAL_COMMUNITY): Payer: Medicare Other

## 2015-12-17 ENCOUNTER — Encounter (HOSPITAL_COMMUNITY): Payer: Self-pay | Admitting: Hematology & Oncology

## 2015-12-17 ENCOUNTER — Encounter (HOSPITAL_COMMUNITY): Payer: Medicare Other | Attending: Hematology & Oncology | Admitting: Hematology & Oncology

## 2015-12-17 VITALS — BP 121/57 | HR 93 | Temp 97.8°F | Resp 18 | Wt 185.0 lb

## 2015-12-17 DIAGNOSIS — Z170 Estrogen receptor positive status [ER+]: Secondary | ICD-10-CM | POA: Diagnosis not present

## 2015-12-17 DIAGNOSIS — C50211 Malignant neoplasm of upper-inner quadrant of right female breast: Secondary | ICD-10-CM | POA: Diagnosis not present

## 2015-12-17 NOTE — Progress Notes (Signed)
Caitlin [name]  Progress Note  Patient Care Team: Sharilyn Sites, MD as PCP - General  CHIEF COMPLAINTS/PURPOSE OF CONSULTATION:  pT1cN0M0 ER+ PR+ HER 2 neu - carcinoma of the R breast Lumpectomy/sentinel node biopsy with Dr. Barry Dienes on 11/11/2015    Breast cancer of upper-inner quadrant of right female breast ([place])   10/06/2015 Imaging Screening bilateral mammogram BI-RADS Category 0, R breast possible mass, L breast possible mass   10/20/2015 Imaging Diagnostic B mammogram, R breast Ultrasound with suspicious mass in the R breast at the 1 o clock location 5 cm from the nipple, LN in the low R axillae with cortex nodularity   10/27/2015 Initial Biopsy Ultrasound guided biopsy of R axillary LN, BENIGN. Ultrasound guided biopsy of R breast with invasive ductal carcinoma   11/04/2015 Initial Diagnosis Breast cancer of upper-inner quadrant of right female breast ([place])   11/09/2015 Procedure Radioactive seed localization R breast   11/11/2015 Surgery R breast radioactive seed localized lumpectomy and seed targeted sentinel LN biopsy   11/11/2015 Pathology Results Invasive ductal carcinoma Grade I/III spanning 1.1 cm, lobular neoplasia (LCIS) resection margins negative. 3 negative sentinel nodes ER (100%), PR (100%), Her2 neu negative pT1c, pN0     HISTORY OF PRESENTING ILLNESS:  Caitlin [name] 70 y.o. female is here because of Stage I infiltrating ductal carcinoma with associated LCIS,  ER+ PR+ and HER2-. She has undergone definitive surgical therapy with Dr. Barry Dienes. Screening mammogram on 10/06/3015 showed a mass in the inner quadrant of the R breast measuring 5 mm. Ultrasound confirmed this mass and also a LN in the R axillae measuring 1.3 cm. Abnormality in the L breast was consistent with a normal intramammary LN. Biopsy of the R LN was benign, sentinel node procedure was recommended.   Caitlin [name] was here with her husband Caitlin [name]. She had definitive surgical therapy with Dr. Barry Dienes on 4/12.   Final surgical pathology revealed total tumor dimension of 1.1cm with clear margins, 3 sentinel nodes were all negative for disease.  ONCOTYPE send on surgical specimen did not have enough tissue to test, Dr. Lyndon Code sent off biopsy specimen, results are to be back tomorrow.   I spoke with her about the issues with her oncotype testing and these results should be in tomorrow.   Her right breast aches, rather than hurts. It is not sore to the point that she has to lie down, explaining "it does not keep me from doing anything". She has not experienced any issues with her right arm or hand.   Denies breathing issues or chest pain. She is able to move her arms in a normal range of motion without issue. Her appetite is good, noting she eats slow with the gastric sleeve.    MEDICAL HISTORY:  Past Medical History  Diagnosis Date  . Asthma   . Hyperlipidemia   . Impaired fasting glucose   . Hypertension   . Breast mass     benign  . GERD (gastroesophageal reflux disease)   . History of hiatal hernia     SURGICAL HISTORY: Past Surgical History  Procedure Laterality Date  . Cholecystectomy  09/08/2010  . Vaginal hysterectomy  1980s  . Tonsillectomy  1968  . Laser ablation of vascular lesion  2007/2008  . Colonoscopy    . Upper gastrointestinal endoscopy    . Colonoscopy N/A 09/25/2013    Procedure: COLONOSCOPY;  Surgeon: Rogene Houston, MD;  [facility];  Service: Endoscopy;  Laterality: N/A;  100-moved to 1200 Ann to notify pt  . Laparoscopic gastric sleeve resection  11-2013    at [facility]  . Hernia repair  12/24/2010, [id number]  . Breast surgery Left     breast biopies x4  . Breast lumpectomy with radioactive seed and sentinel lymph node biopsy Right 11/11/2015    Procedure: RIGHT BREAST RADIOACTIVE SEED GUIDED  LUMPECTOMY AND RADIOACTIVE SEED TARGETED SENTINEL LYMPH NODE BIOPSY;  Surgeon: Stark Klein, MD;  Location: [place];  Service: General;  Laterality: Right;      SOCIAL HISTORY: Social History   Social History  . Marital Status: Married    Spouse Name: N/A  . Number of Children: 2  . Years of Education: N/A   Occupational History  . retired     Pharmacist, hospital   Social History Main Topics  . Smoking status: Never Smoker   . Smokeless tobacco: Never Used  . Alcohol Use: Yes     Comment: occasional wine  . Drug Use: No  . Sexual Activity: Not on file   Other Topics Concern  . Not on file   Social History Narrative  Married for 44 years. 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  From [place], [place] Father was 24 when he passed had rectal/colon cancer when he was 68 years old. Died from congestive heart failure. Mother was 34 when she passed; had dementia 3 sisters-Older sister had health problems Maternal Grandmother had breast cancer. Natural Menopause during late 75's  FAMILY HISTORY: Family History  Problem Relation Age of Onset  . Colon cancer Father   . Rectal cancer Father   . Prostate cancer Father   . Dementia Mother   . Osteoporosis Mother   . Pancreatic cancer Paternal Grandfather   . Heart disease Paternal Grandmother   . Heart disease Maternal Grandfather   . Breast cancer Maternal Grandmother   . Healthy Daughter   . Healthy Son     ALLERGIES:  is allergic to tetanus toxoids.  MEDICATIONS:  Current Outpatient Prescriptions  Medication Sig Dispense Refill  . acidophilus (RISAQUAD) CAPS capsule Take 1 capsule by mouth daily.    . B Complex Vitamins (B-COMPLEX/B-12 SL) Place 1,000 mcg/day under the tongue daily.    . budesonide-formoterol (SYMBICORT) 160-4.5 MCG/ACT inhaler Inhale 2 puffs into the lungs daily as needed (for shortness of breath). Take 1 puffs first thing in am and then another 1 puffs about 12 hours later    . Calcium Citrate-Vitamin D (CALCIUM  CITRATE CHEWY BITE) 500-500 MG-UNIT CHEW Chew 500 mg by mouth. One soft chews twice daily    . Calcium-Vitamin D-Iron (RA CALCIUM PLUS IRON/VIT D PO) Take 1 tablet by mouth at bedtime.    [name] dextromethorphan-guaiFENesin (MUCINEX DM) 30-600 MG per 12 hr tablet Take 1 tablet by mouth daily as needed for cough (and congestion). Reported on 11/16/2015    . docusate sodium (COLACE) 100 MG capsule Take 100 mg by mouth daily. Daily at bedtime    . esomeprazole (NEXIUM) 20 MG capsule Take 20 mg by mouth daily at 12 noon.    . famotidine (PEPCID) 20 MG tablet Take 20 mg by mouth at bedtime.    . magnesium oxide (MAG-OX) 400 MG tablet Take 400 mg by mouth daily.    . Menaquinone-7 (VITAMIN K2) 100 MCG CAPS Take 100 mcg/day by  mouth daily after supper.    [name] MILK THISTLE EXTRACT PO Take 1 tablet by mouth. '1000mg'$     . Multiple Vitamin (MULTIVITAMIN) capsule Take 1 capsule by mouth daily.    [name] oxyCODONE-acetaminophen (ROXICET) 5-325 MG tablet Take 1-2 tablets by mouth every 4 (four) hours as needed for severe pain. (Patient not taking: Reported on 11/16/2015) 30 tablet 0  . pravastatin (PRAVACHOL) 20 MG tablet Take 20 mg by mouth daily.    [name] telmisartan-hydrochlorothiazide (MICARDIS HCT) 80-25 MG per tablet Take 0.5 tablets by mouth daily.      No current facility-administered medications for this visit.    Review of Systems  Constitutional: Negative.  Negative for fever, chills, weight loss and malaise/fatigue.  HENT: Negative.  Negative for congestion, hearing loss, nosebleeds, sore throat and tinnitus.   Eyes: Negative.  Negative for blurred vision, double vision, pain and discharge.  Respiratory: Negative.  Negative for cough, hemoptysis, sputum production, shortness of breath and wheezing.   Cardiovascular: Negative.  Negative for chest pain, palpitations, claudication, leg swelling and PND.  Gastrointestinal: Negative.  Negative for heartburn, nausea, vomiting, abdominal pain, diarrhea, constipation,  blood in stool and melena.  Genitourinary: Negative.  Negative for dysuria, urgency, frequency and hematuria.  Musculoskeletal: Negative.  Negative for myalgias, joint pain and falls.  Skin: Negative.  Negative for itching and rash.       Right breast soreness.  Neurological: Negative.  Negative for dizziness, tingling, tremors, sensory change, speech change, focal weakness, seizures, loss of consciousness, weakness and headaches.  Endo/Heme/Allergies: Negative.  Does not bruise/bleed easily.  Psychiatric/Behavioral: Negative.  Negative for depression, suicidal ideas, memory loss and substance abuse. The patient is not nervous/anxious and does not have insomnia.   All other systems reviewed and are negative.  14 point ROS was done and is otherwise as detailed above or in HPI   PHYSICAL EXAMINATION: ECOG PERFORMANCE STATUS: 0 - Asymptomatic  Filed Vitals:   12/17/15 1000  BP: 121/57  Pulse: 93  Temp: 97.8 F (36.6 C)  Resp: 18   Filed Weights   12/17/15 1000  Weight: 185 lb (83.915 kg)     Physical Exam  Constitutional: She is oriented to person, place, and time and well-developed, well-nourished, and in no distress.  Wears glasses  HENT:  Head: Normocephalic and atraumatic.  Nose: Nose normal.  Mouth/Throat: Oropharynx is clear and moist. No oropharyngeal exudate.  Eyes: Conjunctivae and EOM are normal. Pupils are equal, round, and reactive to light. Right eye exhibits no discharge. Left eye exhibits no discharge. No scleral icterus.  Neck: Normal range of motion. Neck supple. No tracheal deviation present. No thyromegaly present.  Cardiovascular: Normal rate, regular rhythm and normal heart sounds.  Exam reveals no gallop and no friction rub.   No murmur heard. Pulmonary/Chest: Effort normal and breath sounds normal. She has no wheezes. She has no rales.  Abdominal: Soft. Bowel sounds are normal. She exhibits no distension and no mass. There is no tenderness. There is no  rebound and no guarding.  Musculoskeletal: Normal range of motion. She exhibits no edema.  Lymphadenopathy:    She has no cervical adenopathy.  Neurological: She is alert and oriented to person, place, and time. She has normal reflexes. No cranial nerve deficit. Gait normal. Coordination normal.  Skin: Skin is warm and dry. No rash noted.  Psychiatric: Mood, memory, affect and judgment normal.  Nursing note and vitals reviewed.    LABORATORY DATA:  I have reviewed the data as  listed Lab Results  Component Value Date   WBC 6.2 12/20/2010   HGB 12.3 12/20/2010   HCT 36.4 12/20/2010   MCV 93.8 12/20/2010   PLT 284 12/20/2010   CMP     Component Value Date/Time   NA 140 11/06/2015 0950   K 3.8 11/06/2015 0950   CL 104 11/06/2015 0950   CO2 26 11/06/2015 0950   GLUCOSE 97 11/06/2015 0950   BUN 14 11/06/2015 0950   CREATININE 0.52 11/06/2015 0950   CALCIUM 9.2 11/06/2015 0950   PROT 7.3 09/03/2010 1648   ALBUMIN 4.0 09/03/2010 1648   AST 39* 09/03/2010 1648   ALT 38* 09/03/2010 1648   ALKPHOS 92 09/03/2010 1648   BILITOT 1.0 09/03/2010 1648   GFRNONAA >60 11/06/2015 0950   GFRAA >60 11/06/2015 0950   RADIOGRAPHIC STUDIES: I have personally reviewed the radiological images as listed and agreed with the findings in the report. No results found.  Study Result     CLINICAL DATA: Patient is post ultrasound-guided radioactive seed placement within a biopsy proven malignancy over the upper inner quadrant of the right breast as well as a second radioactive seed placement within a previously biopsied right axillary node in the lower axillary region.  EXAM: DIAGNOSTIC RIGHT MAMMOGRAM POST ULTRASOUND-GUIDED RADIOACTIVE SEED PLACEMENT  COMPARISON: Previous exam(s).  FINDINGS: Mammographic images were obtained following ultrasound-guided radioactive seed placement. These demonstrate satisfactory placement of a radioactive seed within the biopsy proven malignancy at the  1 o'clock position of the right breast immediately inferior-medial to the ribbon shaped metallic clip. The second radioactive seed lies within the previously biopsied lymph node immediately anterior superior to the post biopsy wing shaped clip in the lower right axillary region.  IMPRESSION: Appropriate location of 2 radioactive seeds.  Final Assessment: Post Procedure Mammograms for Seed Placement   Electronically Signed  By: Marin Olp M.D.  On: 11/09/2015 15:06   Study Result     CLINICAL DATA: Patient is post ultrasound-guided radioactive seed localization of a biopsy-proven malignancy over the 1 o'clock position of the right breast.  EXAM: ULTRASOUND GUIDED RADIOACTIVE SEED LOCALIZATION OF THE RIGHT BREAST  COMPARISON: Previous exam(s).  FINDINGS: Patient presents for radioactive seed localization prior to surgical excision. I met with the patient and we discussed the procedure of seed localization including benefits and alternatives. We discussed the high likelihood of a successful procedure. We discussed the risks of the procedure including infection, bleeding, tissue injury and further surgery. We discussed the low dose of radioactivity involved in the procedure. Informed, written consent was given.  The usual time-out protocol was performed immediately prior to the procedure.  Using ultrasound guidance, sterile technique, 1% lidocaine and an I-125 radioactive seed, the targeted mass/clip over the upper inner right breast was localized using a medial to lateral approach. The follow-up mammogram images confirm the seed in the expected location and were marked for Dr. Cher Nakai.  Follow-up survey of the patient confirms presence of the radioactive seed.  Order number of I-125 seed: [id number].  Total activity: 0.262 mCi Reference Date: 11/05/2015  The patient tolerated the procedure well and was released from the [facility].  She was given instructions regarding seed removal.  IMPRESSION: Radioactive seed localization right breast. No apparent complications.   Electronically Signed  By: Marin Olp M.D.  On: 11/09/2015 15:33    PATHOLOGY:       ASSESSMENT & PLAN:  pT1cN0M0 ER+ PR+ HER 2 neu - carcinoma of the R breast, upper inner quadrant Lumpectomy/sentinel  node biopsy with Dr. Barry Dienes on 11/11/2015 Bone Density 09/2014 NORMAL  Oncotype results are not back. Originally sent on surgical pathology but unable to obtain a result secondary to lack of tissue. Dr. Lyndon Code has sent off biopsy, still pending.  I am going to refer her to XRT with Dr. Pablo Ledger. I will notify her of her oncotype. Should the results change the plan of going forward with XRT we can discuss.   I will see her back in approximately 8 weeks. At this next visit, we will discuss and start endocrine therapy.   All questions were answered. The patient knows to call the clinic with any problems, questions or concerns.  This document serves as a record of services personally performed by Ancil Linsey, MD. It was created on her behalf by Arlyce Harman, a trained medical scribe. The creation of this record is based on the scribe's personal observations and the provider's statements to them. This document has been checked and approved by the attending provider.  I have reviewed the above documentation for accuracy and completeness, and I agree with the above.  This note was electronically signed.    Molli Hazard, MD  12/17/2015 10:13 AM

## 2015-12-17 NOTE — Patient Instructions (Signed)
[place] at [facility] Discharge Instructions  RECOMMENDATIONS MADE BY THE CONSULTANT AND ANY TEST RESULTS WILL BE SENT TO YOUR REFERRING PHYSICIAN.  Exam and discussion today with Dr. Whitney Muse. Referral to Dr. Pablo Ledger for radiation therapy at [facility]. Office visit as scheduled in 10 weeks. Call the clinic should you have any questions or concerns.  Thank you for choosing [place] at [facility] to provide your oncology and hematology care.  To afford each patient quality time with our provider, please arrive at least 15 minutes before your scheduled appointment time.   Beginning January 23rd 2017 lab work for the [facility] will be done in the  Main lab at [facility] on 1st floor. If you have a lab appointment with the [facility] please come in thru the  Main Entrance and check in at the main information desk  You need to re-schedule your appointment should you arrive 10 or more minutes late.  We strive to give you quality time with our providers, and arriving late affects you and other patients whose appointments are after yours.  Also, if you no show three or more times for appointments you may be dismissed from the clinic at the providers discretion.     Again, thank you for choosing [facility].  Our hope is that these requests will decrease the amount of time that you wait before being seen by our physicians.       _____________________________________________________________  Should you have questions after your visit to [facility], please contact our office at [phone] between the hours of 8:30 a.m. and 4:30 p.m.  Voicemails left after 4:30 p.m. will not be returned until the following business day.  For prescription refill requests, have your pharmacy contact our office.         Resources For Cancer Patients and their Caregivers ? American Cancer Society: Can assist with  transportation, wigs, general needs, runs Look Good Feel Better.        910-259-5135 ? Cancer Care: Provides financial assistance, online support groups, medication/co-pay assistance.  1-800-813-HOPE (331) 599-6268) ? [facility] Assists [place] cancer patients and their families through emotional , educational and financial support.  856-233-0523 ? [place] DSS Where to apply for food stamps, Medicaid and utility assistance. 832 384 6679 ? [facility]: Transportation to medical appointments. (832) 255-3187 ? Social Security Administration: May apply for disability if have a Stage IV cancer. (586)530-1903 252-250-2368 ? [employer], Disability and Transit Services: Assists with nutrition, care and transit needs. [facility] Support Programs: @10RELATIVEDAYS @ > Cancer Support Group  2nd Tuesday of the month 1pm-2pm, Journey Room  > Creative Journey  3rd Tuesday of the month 1130am-1pm, Journey Room  > Look Good Feel Better  1st Wednesday of the month 10am-12 noon, Journey Room (Call [facility] to register [phone])

## 2015-12-18 ENCOUNTER — Telehealth (HOSPITAL_COMMUNITY): Payer: Self-pay | Admitting: *Deleted

## 2015-12-18 DIAGNOSIS — C50211 Malignant neoplasm of upper-inner quadrant of right female breast: Secondary | ICD-10-CM | POA: Diagnosis not present

## 2015-12-18 NOTE — Telephone Encounter (Signed)
Pt notified that Oncotype results are not in yet. She said ok.

## 2015-12-21 ENCOUNTER — Telehealth (HOSPITAL_COMMUNITY): Payer: Self-pay | Admitting: *Deleted

## 2015-12-21 NOTE — Telephone Encounter (Signed)
Pt notified that she had a low risk of recurrence per Oncotype results and that she would not need chemotherapy. She was very happy.

## 2015-12-25 ENCOUNTER — Ambulatory Visit (HOSPITAL_COMMUNITY): Payer: Medicare Other | Admitting: Hematology & Oncology

## 2015-12-25 NOTE — Progress Notes (Signed)
Location of Breast Cancer:Upper-inner quadrant of right female breast  Histology per Pathology Report:  Diagnosis  11-11-15 1. Breast, lumpectomy, Right - INVASIVE DUCTAL CARCINOMA, GRADE I/III, SPANNING 1.1 CM. - LOBULAR NEOPLASIA (LOBULAR CARCINOMA IN SITU). - THE SURGICAL RESECTION MARGINS ARE NEGATIVE FOR DUCTAL CARCINOMA. - SEE ONCOLOGY TABLE BELOW. 2. Lymph node, sentinel, biopsy, Right axillary #1 - THERE IS NO EVIDENCE OF CARCINOMA IN 1 OF 1 LYMPH NODE (0/1). 3. Lymph node, sentinel, biopsy, Right axillary #2 - THERE IS NO EVIDENCE OF CARCINOMA IN 1 OF 1 LYMPH NODE (0/1). 4. Lymph node, sentinel, biopsy, Right axillary #3 - THERE IS NO EVIDENCE OF CARCINOMA IN 1 OF 1 LYMPH NODE (0/1). Receptor Status: ER(100 % +), PR (100 % +), Her2-neu (Neg), Ki-(3 %)  Mrs. Caitlin [name] presented with a screening mammogram on 10/06/3015 showed a mass in the inner quadrant of the R breast.  Past/Anticipated interventions by surgeon, if any:  Past/Anticipated interventions by medical oncology, if any: Radiation referral  Lymphedema issues, if any:   None  Pain issues, if any:  None  SAFETY ISSUES:  Prior radiation: None  Pacemaker/ICD? :  Possible current pregnancy?:No  Is the patient on methotrexate? : No  Current Complaints / other details: ? Here to discuss radiation treatment   Menarche, G 2, P 2, BC, Menopause Late 40'S, HRT no BP 120/60 mmHg  Pulse 71  Temp(Src) 98.1 F (36.7 C) (Oral)  Resp 18  Ht 5.3" (0.135 m)  Wt 185 lb 12.8 oz (84.278 kg)  BMI 4624.31 kg/m2  SpO2 100%   Caitlin Spurling, RN 12/25/2015,5:03 PM

## 2015-12-31 ENCOUNTER — Encounter: Payer: Self-pay | Admitting: Radiation Oncology

## 2015-12-31 ENCOUNTER — Ambulatory Visit
Admission: RE | Admit: 2015-12-31 | Discharge: 2015-12-31 | Disposition: A | Discharge status: Home / Self Care | Payer: Medicare Other | Source: Ambulatory Visit | Attending: Radiation Oncology | Admitting: Radiation Oncology

## 2015-12-31 VITALS — BP 120/60 | HR 71 | Temp 98.1°F | Resp 18 | Ht <= 58 in | Wt 185.8 lb

## 2015-12-31 DIAGNOSIS — C50211 Malignant neoplasm of upper-inner quadrant of right female breast: Secondary | ICD-10-CM

## 2015-12-31 DIAGNOSIS — Z510 Encounter for antineoplastic radiation therapy: Secondary | ICD-10-CM | POA: Diagnosis not present

## 2015-12-31 DIAGNOSIS — Z170 Estrogen receptor positive status [ER+]: Secondary | ICD-10-CM | POA: Diagnosis not present

## 2015-12-31 DIAGNOSIS — C50911 Malignant neoplasm of unspecified site of right female breast: Secondary | ICD-10-CM | POA: Diagnosis not present

## 2015-12-31 NOTE — Progress Notes (Signed)
   Department of Radiation Oncology  Phone:  [phone] Fax:        [phone]   Name: Caitlin [name] MRN: [id number]  DOB: 1945-10-23  Date: 12/31/2015  Follow Up Visit Note  Diagnosis: pT1cN0M0 Right Breast Cancer   Interval History: Lynneah presents today for routine followup. I originally saw this patient in consultation on 11/04/15. The patient successfully underwent right breast lumpectomy on 11/11/15 with Dr. Barry Dienes. Pathology indicated an  Invasive ductal carcinoma, grade I/III, spanning 1.1 cm. The surgical resection margins were negative, 0/3 lymph nodes were negative. Her Oncotype results came back as a low risk of recurrence. She presents to discuss proceeding with radiation treatment.    Physical Exam:  Filed Vitals:   12/31/15 1038  BP: 120/60  Pulse: 71  Temp: 98.1 F (36.7 C)  TempSrc: Oral  Resp: 18  Height: 5.3" (0.135 m)  Weight: 185 lb 12.8 oz (84.278 kg)  SpO2: 100%    Alert and Oriented, no distress.  IMPRESSION: Casi is a 70 y.o. female diagnosed with invasive ductal carcinoma of the right breast s/p right lumpectomy. The patient is appropriate to proceed with radiation treatment to the right breast.   PLAN:  I spoke to the patient today regarding her diagnosis and options for treatment. We discussed the equivalence in terms of survival and local failure between mastectomy and breast conservation. We discussed the role of radiation in decreasing local failures in patients who undergo lumpectomy. We discussed the process of simulation and the placement tattoos. We discussed 4-6 weeks of treatment as an outpatient. We discussed the possibility of asymptomatic lung damage. We discussed the low likelihood of secondary malignancies. We discussed the possible side effects including but not limited to skin redness, fatigue, permanent skin darkening, and breast swelling.   She will proceed with CT simulation tomorrow 2:30pm.   [place] patient [place] I advised her to discuss her plans and schedule with the treatment therapists.   ------------------------------------------------  Thea Silversmith, MD  This document serves as a record of services personally performed by Thea Silversmith, MD. It was created on her behalf by Derek Mound, a trained medical scribe. The creation of this record is based on the scribe's personal observations and the provider's statements to them. This document has been checked and approved by the attending provider.

## 2015-12-31 NOTE — Addendum Note (Signed)
Encounter addended by: Malena Edman, RN on: 12/31/2015  3:07 PM<BR>     Documentation filed: Charges VN

## 2016-01-01 ENCOUNTER — Ambulatory Visit
Admission: RE | Admit: 2016-01-01 | Discharge: 2016-01-01 | Disposition: A | Discharge status: Home / Self Care | Payer: Medicare Other | Source: Ambulatory Visit | Attending: Radiation Oncology | Admitting: Radiation Oncology

## 2016-01-01 DIAGNOSIS — C50911 Malignant neoplasm of unspecified site of right female breast: Secondary | ICD-10-CM | POA: Diagnosis not present

## 2016-01-01 DIAGNOSIS — C50211 Malignant neoplasm of upper-inner quadrant of right female breast: Secondary | ICD-10-CM | POA: Diagnosis not present

## 2016-01-01 DIAGNOSIS — Z510 Encounter for antineoplastic radiation therapy: Secondary | ICD-10-CM | POA: Diagnosis not present

## 2016-01-01 DIAGNOSIS — Z170 Estrogen receptor positive status [ER+]: Secondary | ICD-10-CM | POA: Diagnosis not present

## 2016-01-01 NOTE — Progress Notes (Signed)
Name: Caitlin [name]   MRN: [id number]  Date:  01/01/2016  DOB: 1945/11/11  Status:outpatient    DIAGNOSIS: Breast cancer.  CONSENT VERIFIED: yes   SET UP: Patient is setup supine   IMMOBILIZATION:  The following immobilization was used:Custom Moldable Pillow, breast board.   NARRATIVE: [name] was brought to the [facility].  Identity was confirmed.  All relevant records and images related to the planned course of therapy were reviewed.  Then, the patient was positioned in a stable reproducible clinical set-up for radiation therapy.  Wires were placed to delineate the clinical extent of breast tissue. A wire was placed on the scar as well.  CT images were obtained.  An isocenter was placed. Skin markings were placed.  The CT images were loaded into the planning software where the target and avoidance structures were contoured.  The radiation prescription was entered and confirmed. The patient was discharged in stable condition and tolerated simulation well.    TREATMENT PLANNING NOTE:  Treatment planning then occurred. I have requested : MLC's, isodose plan, basic dose calculation  I personally designed and supervised the construction of 3 medically necessary complex treatment devices for the protection of critical normal structures including the lungs and contralateral breast as well as the immobilization device which is necessary for set up certainty.   3D simulation occurred. I requested and analyzed a dose volume histogram of the heart, lungs and lumpectomy cavity.

## 2016-01-01 NOTE — Progress Notes (Signed)
Radiation Oncology         [phone] ________________________________  Name: Caitlin [name]      MRN: [id number]          Date: 01/01/2016              DOB: 1946/03/31  Optical Surface Tracking Plan:  Since intensity modulated radiotherapy (IMRT) and 3D conformal radiation treatment methods are predicated on accurate and precise positioning for treatment, intrafraction motion monitoring is medically necessary to ensure accurate and safe treatment delivery.  The ability to quantify intrafraction motion without excessive ionizing radiation dose can only be performed with optical surface tracking. Accordingly, surface imaging offers the opportunity to obtain 3D measurements of patient position throughout IMRT and 3D treatments without excessive radiation exposure.  I am ordering optical surface tracking for this patient's upcoming course of radiotherapy. ________________________________ Signature   Reference:   Ursula Alert, J, et al. Surface imaging-based analysis of intrafraction motion for breast radiotherapy patients.Journal of [place], n. 6, nov. 2014. ISSN [id number].   Available at: <http://www.jacmp.org/index.php/jacmp/article/view/4957>.

## 2016-01-06 DIAGNOSIS — R350 Frequency of micturition: Secondary | ICD-10-CM | POA: Diagnosis not present

## 2016-01-06 DIAGNOSIS — N3020 Other chronic cystitis without hematuria: Secondary | ICD-10-CM | POA: Diagnosis not present

## 2016-01-06 DIAGNOSIS — R351 Nocturia: Secondary | ICD-10-CM | POA: Diagnosis not present

## 2016-01-07 DIAGNOSIS — C50211 Malignant neoplasm of upper-inner quadrant of right female breast: Secondary | ICD-10-CM | POA: Diagnosis not present

## 2016-01-07 DIAGNOSIS — Z170 Estrogen receptor positive status [ER+]: Secondary | ICD-10-CM | POA: Diagnosis not present

## 2016-01-07 DIAGNOSIS — Z510 Encounter for antineoplastic radiation therapy: Secondary | ICD-10-CM | POA: Diagnosis not present

## 2016-01-07 DIAGNOSIS — C50911 Malignant neoplasm of unspecified site of right female breast: Secondary | ICD-10-CM | POA: Diagnosis not present

## 2016-01-08 ENCOUNTER — Ambulatory Visit
Admission: RE | Admit: 2016-01-08 | Discharge: 2016-01-08 | Disposition: A | Discharge status: Home / Self Care | Payer: Medicare Other | Source: Ambulatory Visit | Attending: Radiation Oncology | Admitting: Radiation Oncology

## 2016-01-08 DIAGNOSIS — Z510 Encounter for antineoplastic radiation therapy: Secondary | ICD-10-CM | POA: Diagnosis not present

## 2016-01-08 DIAGNOSIS — C50211 Malignant neoplasm of upper-inner quadrant of right female breast: Secondary | ICD-10-CM | POA: Diagnosis not present

## 2016-01-08 DIAGNOSIS — C50911 Malignant neoplasm of unspecified site of right female breast: Secondary | ICD-10-CM | POA: Diagnosis not present

## 2016-01-08 DIAGNOSIS — Z170 Estrogen receptor positive status [ER+]: Secondary | ICD-10-CM | POA: Diagnosis not present

## 2016-01-11 ENCOUNTER — Ambulatory Visit
Admission: RE | Admit: 2016-01-11 | Discharge: 2016-01-11 | Disposition: A | Discharge status: Home / Self Care | Payer: Medicare Other | Source: Ambulatory Visit | Attending: Radiation Oncology | Admitting: Radiation Oncology

## 2016-01-11 DIAGNOSIS — Z170 Estrogen receptor positive status [ER+]: Secondary | ICD-10-CM | POA: Diagnosis not present

## 2016-01-11 DIAGNOSIS — C50911 Malignant neoplasm of unspecified site of right female breast: Secondary | ICD-10-CM | POA: Diagnosis not present

## 2016-01-11 DIAGNOSIS — Z510 Encounter for antineoplastic radiation therapy: Secondary | ICD-10-CM | POA: Diagnosis not present

## 2016-01-11 DIAGNOSIS — C50211 Malignant neoplasm of upper-inner quadrant of right female breast: Secondary | ICD-10-CM | POA: Insufficient documentation

## 2016-01-11 MED ORDER — ALRA NON-METALLIC DEODORANT (RAD-ONC)
1.0000 "application " | Freq: Once | TOPICAL | Status: AC
  Administered 2016-01-11: 1 via TOPICAL

## 2016-01-11 MED ORDER — RADIAPLEXRX EX GEL
Freq: Once | CUTANEOUS | Status: AC
  Administered 2016-01-11: 13:00:00 via TOPICAL

## 2016-01-12 ENCOUNTER — Encounter: Payer: Self-pay | Admitting: Radiation Oncology

## 2016-01-12 ENCOUNTER — Ambulatory Visit
Admission: RE | Admit: 2016-01-12 | Discharge: 2016-01-12 | Disposition: A | Discharge status: Home / Self Care | Payer: Medicare Other | Source: Ambulatory Visit | Attending: Radiation Oncology | Admitting: Radiation Oncology

## 2016-01-12 VITALS — BP 110/53 | HR 98 | Temp 98.1°F | Resp 18 | Ht 63.0 in | Wt 186.9 lb

## 2016-01-12 DIAGNOSIS — C50211 Malignant neoplasm of upper-inner quadrant of right female breast: Secondary | ICD-10-CM

## 2016-01-12 DIAGNOSIS — Z510 Encounter for antineoplastic radiation therapy: Secondary | ICD-10-CM | POA: Diagnosis not present

## 2016-01-12 DIAGNOSIS — Z170 Estrogen receptor positive status [ER+]: Secondary | ICD-10-CM | POA: Diagnosis not present

## 2016-01-12 DIAGNOSIS — C50911 Malignant neoplasm of unspecified site of right female breast: Secondary | ICD-10-CM | POA: Diagnosis not present

## 2016-01-12 NOTE — Progress Notes (Signed)
Weekly Management Note Current Dose:  5.34 Gy  Projected Dose: 42.72 Gy   Narrative:  The patient presents for routine under treatment assessment.  CBCT/MVCT images/Port film x-rays were reviewed.  The chart was checked. Doing well. No complaints.   Physical Findings: Weight: 186 lb 14.4 oz (84.777 kg). Unchanged  Impression:  The patient is tolerating radiation.  Plan:  Continue treatment as planned. Continue radiaplex.

## 2016-01-12 NOTE — Progress Notes (Addendum)
Caitlin [name] has received to 2 fractions to her right breast.  Right breast with normal color.  Small amount of swelling at the end of the breast.  Using Radiaplex gel bid.  Appetite is good.  Denies fatigue and pain. BP 110/53 mmHg  Pulse 98  Temp(Src) 98.1 F (36.7 C) (Oral)  Resp 18  Ht 5\' 3"  (1.6 m)  Wt 186 lb 14.4 oz (84.777 kg)  BMI 33.12 kg/m2  SpO2 98%

## 2016-01-13 ENCOUNTER — Ambulatory Visit
Admission: RE | Admit: 2016-01-13 | Discharge: 2016-01-13 | Disposition: A | Discharge status: Home / Self Care | Payer: Medicare Other | Source: Ambulatory Visit | Attending: Radiation Oncology | Admitting: Radiation Oncology

## 2016-01-13 DIAGNOSIS — Z510 Encounter for antineoplastic radiation therapy: Secondary | ICD-10-CM | POA: Diagnosis not present

## 2016-01-13 DIAGNOSIS — C50911 Malignant neoplasm of unspecified site of right female breast: Secondary | ICD-10-CM | POA: Diagnosis not present

## 2016-01-13 DIAGNOSIS — Z170 Estrogen receptor positive status [ER+]: Secondary | ICD-10-CM | POA: Diagnosis not present

## 2016-01-13 DIAGNOSIS — C50211 Malignant neoplasm of upper-inner quadrant of right female breast: Secondary | ICD-10-CM | POA: Diagnosis not present

## 2016-01-14 ENCOUNTER — Ambulatory Visit
Admission: RE | Admit: 2016-01-14 | Discharge: 2016-01-14 | Disposition: A | Discharge status: Home / Self Care | Payer: Medicare Other | Source: Ambulatory Visit | Attending: Radiation Oncology | Admitting: Radiation Oncology

## 2016-01-14 DIAGNOSIS — Z170 Estrogen receptor positive status [ER+]: Secondary | ICD-10-CM | POA: Diagnosis not present

## 2016-01-14 DIAGNOSIS — Z510 Encounter for antineoplastic radiation therapy: Secondary | ICD-10-CM | POA: Diagnosis not present

## 2016-01-14 DIAGNOSIS — C50911 Malignant neoplasm of unspecified site of right female breast: Secondary | ICD-10-CM | POA: Diagnosis not present

## 2016-01-14 DIAGNOSIS — C50211 Malignant neoplasm of upper-inner quadrant of right female breast: Secondary | ICD-10-CM | POA: Diagnosis not present

## 2016-01-15 ENCOUNTER — Ambulatory Visit
Admission: RE | Admit: 2016-01-15 | Discharge: 2016-01-15 | Disposition: A | Discharge status: Home / Self Care | Payer: Medicare Other | Source: Ambulatory Visit | Attending: Radiation Oncology | Admitting: Radiation Oncology

## 2016-01-15 DIAGNOSIS — Z510 Encounter for antineoplastic radiation therapy: Secondary | ICD-10-CM | POA: Diagnosis not present

## 2016-01-15 DIAGNOSIS — C50211 Malignant neoplasm of upper-inner quadrant of right female breast: Secondary | ICD-10-CM | POA: Diagnosis not present

## 2016-01-15 DIAGNOSIS — C50911 Malignant neoplasm of unspecified site of right female breast: Secondary | ICD-10-CM | POA: Diagnosis not present

## 2016-01-15 DIAGNOSIS — Z170 Estrogen receptor positive status [ER+]: Secondary | ICD-10-CM | POA: Diagnosis not present

## 2016-01-18 ENCOUNTER — Ambulatory Visit
Admission: RE | Admit: 2016-01-18 | Discharge: 2016-01-18 | Disposition: A | Discharge status: Home / Self Care | Payer: Medicare Other | Source: Ambulatory Visit | Attending: Radiation Oncology | Admitting: Radiation Oncology

## 2016-01-18 ENCOUNTER — Telehealth: Payer: Self-pay | Admitting: *Deleted

## 2016-01-18 DIAGNOSIS — C50211 Malignant neoplasm of upper-inner quadrant of right female breast: Secondary | ICD-10-CM | POA: Diagnosis not present

## 2016-01-18 DIAGNOSIS — C50911 Malignant neoplasm of unspecified site of right female breast: Secondary | ICD-10-CM | POA: Diagnosis not present

## 2016-01-18 DIAGNOSIS — Z170 Estrogen receptor positive status [ER+]: Secondary | ICD-10-CM | POA: Diagnosis not present

## 2016-01-18 DIAGNOSIS — Z510 Encounter for antineoplastic radiation therapy: Secondary | ICD-10-CM | POA: Diagnosis not present

## 2016-01-18 NOTE — Telephone Encounter (Signed)
Relate doing well and without complaints during xrt. Encourage pt to call with questions or needs. Received verbal understanding.

## 2016-01-19 ENCOUNTER — Ambulatory Visit
Admission: RE | Admit: 2016-01-19 | Discharge: 2016-01-19 | Disposition: A | Discharge status: Home / Self Care | Payer: Medicare Other | Source: Ambulatory Visit | Attending: Radiation Oncology | Admitting: Radiation Oncology

## 2016-01-19 ENCOUNTER — Encounter: Payer: Self-pay | Admitting: Radiation Oncology

## 2016-01-19 VITALS — BP 121/61 | HR 79 | Temp 98.1°F | Resp 18 | Ht 63.0 in | Wt 186.5 lb

## 2016-01-19 DIAGNOSIS — C50211 Malignant neoplasm of upper-inner quadrant of right female breast: Secondary | ICD-10-CM | POA: Diagnosis not present

## 2016-01-19 DIAGNOSIS — Z510 Encounter for antineoplastic radiation therapy: Secondary | ICD-10-CM | POA: Diagnosis not present

## 2016-01-19 DIAGNOSIS — Z170 Estrogen receptor positive status [ER+]: Secondary | ICD-10-CM | POA: Diagnosis not present

## 2016-01-19 DIAGNOSIS — C50911 Malignant neoplasm of unspecified site of right female breast: Secondary | ICD-10-CM | POA: Diagnosis not present

## 2016-01-19 NOTE — Progress Notes (Signed)
Caitlin [name] has received 7 fractions to her right breast.  Skin to the right breast with normal color. Using Radiaplex gel bid. Appetite is okay.  Denies fatigue. BP 121/61 mmHg  Pulse 79  Temp(Src) 98.1 F (36.7 C) (Oral)  Resp 18  Ht 5\' 3"  (1.6 m)  Wt 186 lb 8 oz (84.596 kg)  BMI 33.05 kg/m2  SpO2 100%

## 2016-01-19 NOTE — Progress Notes (Signed)
      Department of Radiation Oncology  Phone:  [phone] Fax:        [phone]  01/19/16  Weekly Management Note Current Dose:  18.69 Gy  Projected Dose: 42.72 Gy   Narrative:  The patient presents for routine under treatment assessment.  CBCT/MVCT images/Port film x-rays were reviewed.  The chart was checked.   Caitlin [name] has received 7 fractions to her right breast. Skin to the right breast with normal color. Using Radiaplex gel bid. Appetite is okay. Denies fatigue. Denies itching or discomfort in the breasts. Energy still normal.   Physical Findings: Weight: 186 lb 8 oz (84.596 kg).  No palpable cervical, supraclavicular or axillary lymphoadenopathy. The heart has a regular rate and rhythm. The lungs are clear to auscultation. Erythema centrally in the right breast. Skin is intact.   Impression:  The patient is tolerating radiation.  Plan:  Continue treatment as planned. Continue radiaplex.   -----------------------------------   Blair Promise, PhD, MD  This document serves as a record of services personally performed by Gery Pray, MD. It was created on his behalf by Derek Mound, a trained medical scribe. The creation of this record is based on the scribe's personal observations and the provider's statements to them. This document has been checked and approved by the attending provider.

## 2016-01-20 ENCOUNTER — Ambulatory Visit
Admission: RE | Admit: 2016-01-20 | Discharge: 2016-01-20 | Disposition: A | Discharge status: Home / Self Care | Payer: Medicare Other | Source: Ambulatory Visit | Attending: Radiation Oncology | Admitting: Radiation Oncology

## 2016-01-20 DIAGNOSIS — C50911 Malignant neoplasm of unspecified site of right female breast: Secondary | ICD-10-CM | POA: Diagnosis not present

## 2016-01-20 DIAGNOSIS — Z510 Encounter for antineoplastic radiation therapy: Secondary | ICD-10-CM | POA: Diagnosis not present

## 2016-01-20 DIAGNOSIS — Z170 Estrogen receptor positive status [ER+]: Secondary | ICD-10-CM | POA: Diagnosis not present

## 2016-01-20 DIAGNOSIS — C50211 Malignant neoplasm of upper-inner quadrant of right female breast: Secondary | ICD-10-CM | POA: Diagnosis not present

## 2016-01-21 ENCOUNTER — Ambulatory Visit
Admission: RE | Admit: 2016-01-21 | Discharge: 2016-01-21 | Disposition: A | Discharge status: Home / Self Care | Payer: Medicare Other | Source: Ambulatory Visit | Attending: Radiation Oncology | Admitting: Radiation Oncology

## 2016-01-21 ENCOUNTER — Ambulatory Visit: Payer: Medicare Other

## 2016-01-22 ENCOUNTER — Ambulatory Visit: Payer: Medicare Other

## 2016-01-22 ENCOUNTER — Ambulatory Visit
Admission: RE | Admit: 2016-01-22 | Discharge: 2016-01-22 | Disposition: A | Discharge status: Home / Self Care | Payer: Medicare Other | Source: Ambulatory Visit | Attending: Radiation Oncology | Admitting: Radiation Oncology

## 2016-01-25 ENCOUNTER — Ambulatory Visit
Admission: RE | Admit: 2016-01-25 | Discharge: 2016-01-25 | Disposition: A | Discharge status: Home / Self Care | Payer: Medicare Other | Source: Ambulatory Visit

## 2016-01-25 DIAGNOSIS — C50211 Malignant neoplasm of upper-inner quadrant of right female breast: Secondary | ICD-10-CM | POA: Diagnosis not present

## 2016-01-25 DIAGNOSIS — C50911 Malignant neoplasm of unspecified site of right female breast: Secondary | ICD-10-CM | POA: Diagnosis not present

## 2016-01-25 DIAGNOSIS — Z510 Encounter for antineoplastic radiation therapy: Secondary | ICD-10-CM | POA: Diagnosis not present

## 2016-01-25 DIAGNOSIS — Z170 Estrogen receptor positive status [ER+]: Secondary | ICD-10-CM | POA: Diagnosis not present

## 2016-01-26 ENCOUNTER — Ambulatory Visit
Admission: RE | Admit: 2016-01-26 | Discharge: 2016-01-26 | Disposition: A | Discharge status: Home / Self Care | Payer: Medicare Other | Source: Ambulatory Visit

## 2016-01-26 DIAGNOSIS — Z170 Estrogen receptor positive status [ER+]: Secondary | ICD-10-CM | POA: Diagnosis not present

## 2016-01-26 DIAGNOSIS — C50211 Malignant neoplasm of upper-inner quadrant of right female breast: Secondary | ICD-10-CM | POA: Diagnosis not present

## 2016-01-26 DIAGNOSIS — C50911 Malignant neoplasm of unspecified site of right female breast: Secondary | ICD-10-CM | POA: Diagnosis not present

## 2016-01-26 DIAGNOSIS — Z510 Encounter for antineoplastic radiation therapy: Secondary | ICD-10-CM | POA: Diagnosis not present

## 2016-01-27 ENCOUNTER — Ambulatory Visit
Admission: RE | Admit: 2016-01-27 | Discharge: 2016-01-27 | Disposition: A | Discharge status: Home / Self Care | Payer: Medicare Other | Source: Ambulatory Visit

## 2016-01-27 DIAGNOSIS — Z170 Estrogen receptor positive status [ER+]: Secondary | ICD-10-CM | POA: Diagnosis not present

## 2016-01-27 DIAGNOSIS — Z510 Encounter for antineoplastic radiation therapy: Secondary | ICD-10-CM | POA: Diagnosis not present

## 2016-01-27 DIAGNOSIS — C50211 Malignant neoplasm of upper-inner quadrant of right female breast: Secondary | ICD-10-CM | POA: Diagnosis not present

## 2016-01-27 DIAGNOSIS — C50911 Malignant neoplasm of unspecified site of right female breast: Secondary | ICD-10-CM | POA: Diagnosis not present

## 2016-01-28 ENCOUNTER — Ambulatory Visit
Admission: RE | Admit: 2016-01-28 | Discharge: 2016-01-28 | Disposition: A | Discharge status: Home / Self Care | Payer: Medicare Other | Source: Ambulatory Visit | Attending: Radiation Oncology | Admitting: Radiation Oncology

## 2016-01-28 ENCOUNTER — Encounter: Payer: Self-pay | Admitting: Radiation Oncology

## 2016-01-28 ENCOUNTER — Ambulatory Visit: Payer: Medicare Other

## 2016-01-28 ENCOUNTER — Ambulatory Visit
Admission: RE | Admit: 2016-01-28 | Discharge: 2016-01-28 | Disposition: A | Discharge status: Home / Self Care | Payer: Medicare Other | Source: Ambulatory Visit

## 2016-01-28 VITALS — BP 107/70 | HR 80 | Resp 16 | Wt 182.3 lb

## 2016-01-28 DIAGNOSIS — C50211 Malignant neoplasm of upper-inner quadrant of right female breast: Secondary | ICD-10-CM | POA: Diagnosis not present

## 2016-01-28 DIAGNOSIS — Z510 Encounter for antineoplastic radiation therapy: Secondary | ICD-10-CM | POA: Diagnosis not present

## 2016-01-28 DIAGNOSIS — C50911 Malignant neoplasm of unspecified site of right female breast: Secondary | ICD-10-CM | POA: Diagnosis not present

## 2016-01-28 DIAGNOSIS — Z170 Estrogen receptor positive status [ER+]: Secondary | ICD-10-CM | POA: Diagnosis not present

## 2016-01-28 MED ORDER — RADIAPLEXRX EX GEL
Freq: Once | CUTANEOUS | Status: AC
  Administered 2016-01-28: 12:00:00 via TOPICAL

## 2016-01-28 NOTE — Progress Notes (Signed)
  Radiation Oncology         [phone]   Name: Caitlin [name] MRN: [id number]   Date: 01/28/2016  DOB: 08-10-1945   Weekly Radiation Therapy Management    ICD-9-CM ICD-10-CM   1. Breast cancer of upper-inner quadrant of right female breast (HCC) 174.2 C50.211 hyaluronate sodium (RADIAPLEXRX) gel    Current Dose: 32.04 Gy  Planned Dose:  42.72 Gy  Narrative The patient presents for routine under treatment assessment.   The patient is without complaint. Set-up films were reviewed. The chart was checked.  Physical Findings  weight is 182 lb 4.8 oz (82.691 kg). Her blood pressure is 107/70 and her pulse is 80. Her respiration is 16 and oxygen saturation is 100%. . Weight and vitals stable. Denies pain. Hyperpigmentation of right/treated breast noted without desquamation. Reports using radiaplex as directed. No signes of lymphedema noted. Denies fatigue. FYNN, suvivorship, livestrong and ABC flyers given. One month follow up appointment card given. Provided patient with my business card and encouraged her to call with needs.    Impression The patient is tolerating radiation.  Plan Continue treatment as planned.     Sheral Apley Tammi Klippel, M.D.  This document serves as a record of services personally performed by [name], MD. It was created on his behalf by Truddie Hidden, a trained medical scribe. The creation of this record is based on the scribe's personal observations and the provider's statements to them. This document has been checked and approved by the attending provider.

## 2016-01-28 NOTE — Progress Notes (Signed)
Weight and vitals stable. Denies pain. Hyperpigmentation of right/treated breast noted without desquamation. Reports using radiaplex as directed. No signes of lymphedema noted. Denies fatigue. FYNN, suvivorship, livestrong and ABC flyers given. One month follow up appointment card given. Provided patient with my business card and encouraged her to call with needs.   BP 107/70 mmHg  Pulse 80  Resp 16  Wt 182 lb 4.8 oz (82.691 kg)  SpO2 100% Wt Readings from Last 3 Encounters:  01/28/16 182 lb 4.8 oz (82.691 kg)  01/19/16 186 lb 8 oz (84.596 kg)  01/12/16 186 lb 14.4 oz (84.777 kg)

## 2016-01-29 ENCOUNTER — Ambulatory Visit: Payer: Medicare Other

## 2016-01-29 ENCOUNTER — Ambulatory Visit
Admission: RE | Admit: 2016-01-29 | Discharge: 2016-01-29 | Disposition: A | Discharge status: Home / Self Care | Payer: Medicare Other | Source: Ambulatory Visit

## 2016-01-29 DIAGNOSIS — C50911 Malignant neoplasm of unspecified site of right female breast: Secondary | ICD-10-CM | POA: Diagnosis not present

## 2016-01-29 DIAGNOSIS — Z170 Estrogen receptor positive status [ER+]: Secondary | ICD-10-CM | POA: Diagnosis not present

## 2016-01-29 DIAGNOSIS — Z510 Encounter for antineoplastic radiation therapy: Secondary | ICD-10-CM | POA: Diagnosis not present

## 2016-01-29 DIAGNOSIS — C50211 Malignant neoplasm of upper-inner quadrant of right female breast: Secondary | ICD-10-CM | POA: Diagnosis not present

## 2016-02-01 ENCOUNTER — Ambulatory Visit: Payer: Medicare Other

## 2016-02-01 ENCOUNTER — Ambulatory Visit
Admission: RE | Admit: 2016-02-01 | Discharge: 2016-02-01 | Disposition: A | Discharge status: Home / Self Care | Payer: Medicare Other | Source: Ambulatory Visit | Attending: Radiation Oncology | Admitting: Radiation Oncology

## 2016-02-01 ENCOUNTER — Telehealth: Payer: Self-pay | Admitting: *Deleted

## 2016-02-01 DIAGNOSIS — C50911 Malignant neoplasm of unspecified site of right female breast: Secondary | ICD-10-CM | POA: Diagnosis not present

## 2016-02-01 DIAGNOSIS — Z510 Encounter for antineoplastic radiation therapy: Secondary | ICD-10-CM | POA: Diagnosis not present

## 2016-02-01 DIAGNOSIS — Z170 Estrogen receptor positive status [ER+]: Secondary | ICD-10-CM | POA: Diagnosis not present

## 2016-02-01 DIAGNOSIS — C50211 Malignant neoplasm of upper-inner quadrant of right female breast: Secondary | ICD-10-CM | POA: Diagnosis not present

## 2016-02-01 NOTE — Telephone Encounter (Signed)
  Oncology Nurse Navigator Documentation  Navigator Location: [facility] (02/01/16 1500) Navigator Encounter Type: Telephone (02/01/16 1500) Telephone: Outgoing Call (02/01/16 1500)         Patient Visit Type: RadOnc (02/01/16 1500) Treatment Phase: Final Radiation Tx (02/01/16 1500)                            Time Spent with Patient: 15 (02/01/16 1500)

## 2016-02-02 ENCOUNTER — Ambulatory Visit: Payer: Medicare Other

## 2016-02-03 ENCOUNTER — Ambulatory Visit
Admission: RE | Admit: 2016-02-03 | Discharge: 2016-02-03 | Disposition: A | Discharge status: Home / Self Care | Payer: Medicare Other | Source: Ambulatory Visit | Attending: Radiation Oncology | Admitting: Radiation Oncology

## 2016-02-03 ENCOUNTER — Ambulatory Visit: Payer: Medicare Other

## 2016-02-03 DIAGNOSIS — C50211 Malignant neoplasm of upper-inner quadrant of right female breast: Secondary | ICD-10-CM | POA: Insufficient documentation

## 2016-02-03 DIAGNOSIS — Z510 Encounter for antineoplastic radiation therapy: Secondary | ICD-10-CM | POA: Insufficient documentation

## 2016-02-04 ENCOUNTER — Encounter: Payer: Self-pay | Admitting: Radiation Oncology

## 2016-02-04 ENCOUNTER — Ambulatory Visit: Payer: Medicare Other

## 2016-02-04 ENCOUNTER — Ambulatory Visit: Admission: RE | Admit: 2016-02-04 | Payer: Medicare Other | Source: Ambulatory Visit | Admitting: Radiation Oncology

## 2016-02-04 DIAGNOSIS — C50211 Malignant neoplasm of upper-inner quadrant of right female breast: Secondary | ICD-10-CM | POA: Diagnosis not present

## 2016-02-04 DIAGNOSIS — Z510 Encounter for antineoplastic radiation therapy: Secondary | ICD-10-CM | POA: Diagnosis not present

## 2016-02-05 ENCOUNTER — Ambulatory Visit: Payer: Medicare Other

## 2016-02-08 ENCOUNTER — Ambulatory Visit: Payer: Medicare Other

## 2016-02-09 ENCOUNTER — Ambulatory Visit: Payer: Medicare Other

## 2016-02-10 ENCOUNTER — Telehealth: Payer: Self-pay | Admitting: Radiation Oncology

## 2016-02-10 ENCOUNTER — Ambulatory Visit: Payer: Medicare Other

## 2016-02-10 NOTE — Progress Notes (Signed)
  Radiation Oncology         [phone] ________________________________  Name: Caitlin [name] MRN: [id number]  Date: 02/04/2016  DOB: Apr 01, 1946  End of Treatment Note  Diagnosis: 70 y.o. woman with pT1cN0M0 of the right breast  Indication for treatment: Curative   Radiation treatment dates: 01/11/16 - 02/04/16  Site/dose: 42.72 Gy in 16 fractions to the right breast  Beams/energy: 3D // [employer]  Narrative: The patient tolerated radiation treatment relatively well. The patient had hyperpigmentation of the treatment area. No other complaints.  Plan: The patient has completed radiation treatment. The patient will return to radiation oncology clinic for routine followup in one month. I advised her to call or return sooner if she has any questions or concerns related to her recovery or treatment. ________________________________  Sheral Apley. Tammi Klippel, M.D.  This document serves as a record of services personally performed by [name], MD. It was created on his behalf by Darcus Austin, a trained medical scribe. The creation of this record is based on the scribe's personal observations and the provider's statements to them. This document has been checked and approved by the attending provider.

## 2016-02-10 NOTE — Telephone Encounter (Addendum)
Received phone message from patient requesting return call. Phoned patient back. She questions if she may use benadryl or hydrocortisone on her "itchy skin." Patient s/p xrt to right breast. Advised patient to apply radiaplex to entire breast, allow time for this to soak in, then apply hydrocortisone 1% only to area that itches. She verbalized understanding. She requested to resume using her SECRET deodorant. Explained it is safe to resume using this only if there are no break or openings under her right axilla. She confirmed there is not. Confirmed one month follow up for 8/17 at 1100. Patient understands to contact this RN should skin irritation worsen or other needs present.

## 2016-02-11 ENCOUNTER — Ambulatory Visit: Payer: Medicare Other

## 2016-02-11 DIAGNOSIS — H5201 Hypermetropia, right eye: Secondary | ICD-10-CM | POA: Diagnosis not present

## 2016-02-11 DIAGNOSIS — H35412 Lattice degeneration of retina, left eye: Secondary | ICD-10-CM | POA: Diagnosis not present

## 2016-02-11 DIAGNOSIS — H52223 Regular astigmatism, bilateral: Secondary | ICD-10-CM | POA: Diagnosis not present

## 2016-02-11 DIAGNOSIS — H524 Presbyopia: Secondary | ICD-10-CM | POA: Diagnosis not present

## 2016-02-12 ENCOUNTER — Ambulatory Visit: Payer: Medicare Other

## 2016-02-15 ENCOUNTER — Ambulatory Visit: Payer: Medicare Other

## 2016-02-16 ENCOUNTER — Ambulatory Visit: Payer: Medicare Other

## 2016-02-17 ENCOUNTER — Ambulatory Visit: Payer: Medicare Other

## 2016-02-18 ENCOUNTER — Ambulatory Visit: Payer: Medicare Other

## 2016-02-18 DIAGNOSIS — Z8719 Personal history of other diseases of the digestive system: Secondary | ICD-10-CM | POA: Diagnosis not present

## 2016-02-18 DIAGNOSIS — D539 Nutritional anemia, unspecified: Secondary | ICD-10-CM | POA: Diagnosis not present

## 2016-02-18 DIAGNOSIS — Z09 Encounter for follow-up examination after completed treatment for conditions other than malignant neoplasm: Secondary | ICD-10-CM | POA: Diagnosis not present

## 2016-02-18 DIAGNOSIS — K432 Incisional hernia without obstruction or gangrene: Secondary | ICD-10-CM | POA: Diagnosis not present

## 2016-02-19 ENCOUNTER — Ambulatory Visit: Payer: Medicare Other

## 2016-02-22 ENCOUNTER — Ambulatory Visit: Payer: Medicare Other

## 2016-02-22 ENCOUNTER — Other Ambulatory Visit: Payer: Self-pay | Admitting: *Deleted

## 2016-02-22 DIAGNOSIS — C50211 Malignant neoplasm of upper-inner quadrant of right female breast: Secondary | ICD-10-CM

## 2016-02-23 ENCOUNTER — Ambulatory Visit: Payer: Medicare Other

## 2016-02-24 ENCOUNTER — Ambulatory Visit: Payer: Medicare Other

## 2016-02-24 DIAGNOSIS — R351 Nocturia: Secondary | ICD-10-CM | POA: Diagnosis not present

## 2016-02-24 DIAGNOSIS — N3020 Other chronic cystitis without hematuria: Secondary | ICD-10-CM | POA: Diagnosis not present

## 2016-02-24 DIAGNOSIS — R350 Frequency of micturition: Secondary | ICD-10-CM | POA: Diagnosis not present

## 2016-02-24 NOTE — Progress Notes (Signed)
Caitlin [name]  Progress Note  Patient Care Team: Caitlin Sites, MD as PCP - General  CHIEF COMPLAINTS/PURPOSE OF CONSULTATION:  pT1cN0M0 ER+ PR+ HER 2 neu - carcinoma of the R breast Lumpectomy/sentinel node biopsy with Dr. Barry [name] on 11/11/2015    Breast cancer of upper-inner quadrant of right female breast ([facility])   09/24/2014 Imaging    DEXA with normal bone density      10/06/2015 Imaging    Screening bilateral mammogram BI-RADS Category 0, R breast possible mass, L breast possible mass      10/20/2015 Imaging    Diagnostic B mammogram, R breast Ultrasound with suspicious mass in the R breast at the 1 o clock location 5 cm from the nipple, LN in the low R axillae with cortex nodularity      10/27/2015 Initial Biopsy    Ultrasound guided biopsy of R axillary LN, BENIGN. Ultrasound guided biopsy of R breast with invasive ductal carcinoma      11/04/2015 Initial Diagnosis    Breast cancer of upper-inner quadrant of right female breast ([name])      11/09/2015 Procedure    Radioactive seed localization R breast      11/11/2015 Surgery    R breast radioactive seed localized lumpectomy and seed targeted sentinel LN biopsy      11/11/2015 Pathology Results    Invasive ductal carcinoma Grade I/III spanning 1.1 cm, lobular neoplasia (LCIS) resection margins negative. 3 negative sentinel nodes ER (100%), PR (100%), Her2 neu negative pT1c, pN0      12/11/2015 Oncotype testing    Recurrence Score result 11, 10 year risk of distant recurrence with tamoxifen alone 8%        HISTORY OF PRESENTING ILLNESS:  Caitlin [name] 70 y.o. female is here because of Stage I infiltrating ductal carcinoma with associated LCIS,  ER+ PR+ and HER2-. She has undergone definitive surgical therapy with Dr. Barry [name]. Screening mammogram on 10/06/3015 showed a mass in the inner quadrant of the R breast measuring 5 mm. Ultrasound confirmed this mass and also a LN in the R axillae measuring 1.3 cm.  Abnormality in the L breast was consistent with a normal intramammary LN. Biopsy of the R LN was benign, sentinel node procedure was recommended.   Caitlin [name] was here with her husband Caitlin [name]. She had definitive surgical therapy with Dr. Barry [name] on 4/12.  Final surgical pathology revealed total tumor dimension of 1.1cm with clear margins, 3 sentinel nodes were all negative for disease.  ONCOTYPE send on surgical specimen did not have enough tissue to test, Dr. Lyndon [name] sent off biopsy specimen, results show "low risk disease."  No problems with XRT. She is here for further discussion of endocrine therapy.   Appetite is good. Energy is good.   MEDICAL HISTORY:  Past Medical History:  Diagnosis Date  . Asthma   . Breast mass    benign  . GERD (gastroesophageal reflux disease)   . History of hiatal hernia   . Hyperlipidemia   . Hypertension   . Impaired fasting glucose     SURGICAL HISTORY: Past Surgical History:  Procedure Laterality Date  . BREAST LUMPECTOMY WITH RADIOACTIVE SEED AND SENTINEL LYMPH NODE BIOPSY Right 11/11/2015   Procedure: RIGHT BREAST RADIOACTIVE SEED GUIDED  LUMPECTOMY AND RADIOACTIVE SEED TARGETED SENTINEL LYMPH NODE BIOPSY;  Surgeon: Caitlin Klein, MD;  Location: [facility];  Service: General;  Laterality: Right;  . BREAST SURGERY Left    breast biopies x4  .  CHOLECYSTECTOMY  09/08/2010  . COLONOSCOPY    . COLONOSCOPY N/A 09/25/2013   Procedure: COLONOSCOPY;  Surgeon: Rogene Houston, MD;  [facility];  Service: Endoscopy;  Laterality: N/A;  100-moved to 1200 Ann to notify pt  . HERNIA REPAIR  12/24/2010, 12-2014  . LAPAROSCOPIC GASTRIC SLEEVE RESECTION  11-2013   at [facility]  2007/2008  . TONSILLECTOMY  1968  . UPPER GASTROINTESTINAL ENDOSCOPY    . VAGINAL HYSTERECTOMY  1980s    SOCIAL HISTORY: Social History   Social History  . Marital status: Married    Spouse name: N/A  . Number of children: 2  .  Years of education: N/A   Occupational History  . retired Retired    Pharmacist, hospital   Social History Main Topics  . Smoking status: Never Smoker  . Smokeless tobacco: Never Used  . Alcohol use Yes     Comment: occasional wine  . Drug use: No  . Sexual activity: Not on file   Other Topics Concern  . Not on file   Social History Narrative  . No narrative on file  Married for 44 years. 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  From [place], [place] Father was 45 when he passed had rectal/colon cancer when he was 68 years old. Died from congestive heart failure. Mother was 34 when she passed; had dementia 3 sisters-Older sister had health problems Maternal Grandmother had breast cancer. Natural Menopause during late 16's  FAMILY HISTORY: Family History  Problem Relation Age of Onset  . Colon cancer Father   . Rectal cancer Father   . Prostate cancer Father   . Dementia Mother   . Osteoporosis Mother   . Healthy Daughter   . Healthy Son   . Pancreatic cancer Paternal Grandfather   . Heart disease Paternal Grandmother   . Heart disease Maternal Grandfather   . Breast cancer Maternal Grandmother     ALLERGIES:  is allergic to tetanus toxoids and tetanus toxoid adsorbed.  MEDICATIONS:  Current Outpatient Prescriptions  Medication Sig Dispense Refill  . B Complex Vitamins (B-COMPLEX/B-12 SL) Place 1,000 mcg/day under the tongue daily.    . Calcium Citrate-Vitamin D (CALCIUM CITRATE CHEWY BITE) 500-500 MG-UNIT CHEW Chew 500 mg by mouth. One soft chews twice daily    . Calcium-Vitamin D-Iron (RA CALCIUM PLUS IRON/VIT D PO) Take 1 tablet by mouth at bedtime.    [name] dextromethorphan-guaiFENesin (MUCINEX DM) 30-600 MG 12hr tablet Take by mouth.    . docusate sodium (COLACE) 100 MG capsule Take 100 mg by mouth daily. Daily at bedtime      . esomeprazole (NEXIUM) 20 MG capsule Take 20 mg by mouth daily at 12 noon.    . famotidine (PEPCID) 20 MG tablet Take 20 mg by mouth.    . fluticasone-salmeterol (ADVAIR HFA) 115-21 MCG/ACT inhaler Inhale 2 puffs into the lungs 2 (two) times daily.    . magnesium oxide (MAG-OX) 400 MG tablet Take 400 mg by mouth daily.    . Menaquinone-7 (VITAMIN K2) 100 MCG CAPS Take 100 mcg/day by mouth daily after supper.    [name] MILK THISTLE EXTRACT PO Take 1 tablet by mouth. 1010m    . Multiple Vitamin (MULTIVITAMIN) capsule Take 1 capsule by mouth daily.    . pravastatin (PRAVACHOL) 20 MG  tablet Take 20 mg by mouth daily.    [name] telmisartan-hydrochlorothiazide (MICARDIS HCT) 80-25 MG per tablet Take 0.5 tablets by mouth daily.     [name] anastrozole (ARIMIDEX) 1 MG tablet TAKE ONE TABLET BY MOUTH ONCE DAILY. 30 tablet 0  . Melatonin 3 MG TABS Take 3 mg by mouth.     No current facility-administered medications for this visit.     Review of Systems  Constitutional: Negative.  Negative for chills, fever, malaise/fatigue and weight loss.  HENT: Negative.  Negative for congestion, hearing loss, nosebleeds, sore throat and tinnitus.   Eyes: Negative.  Negative for blurred vision, double vision, pain and discharge.  Respiratory: Negative.  Negative for cough, hemoptysis, sputum production, shortness of breath and wheezing.   Cardiovascular: Negative.  Negative for chest pain, palpitations, claudication, leg swelling and PND.  Gastrointestinal: Negative.  Negative for abdominal pain, blood in stool, constipation, diarrhea, heartburn, melena, nausea and vomiting.  Genitourinary: Negative.  Negative for dysuria, frequency, hematuria and urgency.  Musculoskeletal: Negative.  Negative for falls, joint pain and myalgias.  Skin: Negative.  Negative for itching and rash.       Right breast soreness.  Neurological: Negative.  Negative for dizziness, tingling, tremors, sensory change, speech change, focal weakness,  seizures, loss of consciousness, weakness and headaches.  Endo/Heme/Allergies: Negative.  Does not bruise/bleed easily.  Psychiatric/Behavioral: Negative.  Negative for depression, memory loss, substance abuse and suicidal ideas. The patient is not nervous/anxious and does not have insomnia.   All other systems reviewed and are negative.  14 point ROS was done and is otherwise as detailed above or in HPI   PHYSICAL EXAMINATION: ECOG PERFORMANCE STATUS: 0 - Asymptomatic  Vitals:   02/25/16 1000  BP: (!) 114/52  Pulse: (!) 102  Resp: 18  Temp: 98.9 F (37.2 C)   Filed Weights   02/25/16 1000  Weight: 186 lb 6.4 oz (84.6 kg)     Physical Exam  Constitutional: She is oriented to person, place, and time and well-developed, well-nourished, and in no distress.  Wears glasses  HENT:  Head: Normocephalic and atraumatic.  Nose: Nose normal.  Mouth/Throat: Oropharynx is clear and moist. No oropharyngeal exudate.  Eyes: Conjunctivae and EOM are normal. Pupils are equal, round, and reactive to light. Right eye exhibits no discharge. Left eye exhibits no discharge. No scleral icterus.  Neck: Normal range of motion. Neck supple. No tracheal deviation present. No thyromegaly present.  Cardiovascular: Normal rate, regular rhythm and normal heart sounds.  Exam reveals no gallop and no friction rub.   No murmur heard. Pulmonary/Chest: Effort normal and breath sounds normal. She has no wheezes. She has no rales.  Abdominal: Soft. Bowel sounds are normal. She exhibits no distension and no mass. There is no tenderness. There is no rebound and no guarding.  Musculoskeletal: Normal range of motion. She exhibits no edema.  Lymphadenopathy:    She has no cervical adenopathy.  Neurological: She is alert and oriented to person, place, and time. She has normal reflexes. No cranial nerve deficit. Gait normal. Coordination normal.  Skin: Skin is warm and dry. No rash noted.  Psychiatric: Mood, memory,  affect and judgment normal.  Nursing note and vitals reviewed.    LABORATORY DATA:  I have reviewed the data as listed Lab Results  Component Value Date   WBC 6.2 12/20/2010   HGB 12.3 12/20/2010   HCT 36.4 12/20/2010   MCV 93.8 12/20/2010   PLT 284 12/20/2010   CMP  Component Value Date/Time   NA 140 11/06/2015 0950   K 3.8 11/06/2015 0950   CL 104 11/06/2015 0950   CO2 26 11/06/2015 0950   GLUCOSE 97 11/06/2015 0950   BUN 14 11/06/2015 0950   CREATININE 0.52 11/06/2015 0950   CALCIUM 9.2 11/06/2015 0950   PROT 7.3 09/03/2010 1648   ALBUMIN 4.0 09/03/2010 1648   AST 39 (H) 09/03/2010 1648   ALT 38 (H) 09/03/2010 1648   ALKPHOS 92 09/03/2010 1648   BILITOT 1.0 09/03/2010 1648   GFRNONAA >60 11/06/2015 0950   GFRAA >60 11/06/2015 0950   RADIOGRAPHIC STUDIES: I have personally reviewed the radiological images as listed and agreed with the findings in the report. No results found.  Study Result   CLINICAL DATA:  Biopsy proven invasive ductal carcinoma of the right breast. Axillary lymph node biopsy showed benign adipose tissue with no lymphoid tissue identified. Surgical excision of the lymph node was recommended.  EXAM: SPECIMEN RADIOGRAPH OF THE RIGHT AXILLA.  COMPARISON:  Previous exam(s).  FINDINGS: Status post excision of the right axilla. The radioactive seed and biopsy marker clip are present, completely intact, and were marked for pathology.  IMPRESSION: Specimen radiograph of the right axilla.   Electronically Signed   By: Lillia Mountain M.D.   On: 11/11/2015 12:46     PATHOLOGY:       ASSESSMENT & PLAN:  pT1cN0M0 ER+ PR+ HER 2 neu - carcinoma of the R breast, upper inner quadrant Lumpectomy/sentinel node biopsy with Dr. Barry [name] on 11/11/2015 Bone Density 09/2014 NORMAL Adjuvant XRT ONCOTYPE low risk  She has overall done very well. We discussed endocrine therapy in detail today. We discussed the risks and benefits of  anti-estrogen therapy with aromatase inhibitors. These include but not limited to insomnia, hot flashes, mood changes, vaginal dryness, bone density loss, and weight gain. Although rare, serious side effects including endometrial cancer, risk of blood clots were also discussed. We strongly believe that the benefits far outweigh the risks. Patient understands these risks and consented to starting treatment. Planned treatment duration is 5 years.  While AIs are an effective therapy in reducing the risk of breast cancer recurrence, they increase the risk of developing osteoporosis and commonly cause joint (or muscle) aches and pains. These joint symptoms, often called arthralgias, can interfere with quality of life and are often the cause of a woman stopping therapy early. Studies have found joint symptoms most often develop within the first 3 months on therapy, though some cases continue to develop after 3 months. In studies of AI-related joint symptoms, anywhere from 20-50% of women on therapy reported the side effect. Symptoms typically affect the fingers, hands, wrists, elbows, shoulders, knees and ankles. She and I discussed that there are ways to successfully deal with AI induced joint pain, she is to notify me if she has trouble.   Arimidex will be called in to her pharmacy.   She is up to date with DEXA and had normal bone density. She was instructed on weight bearing exercise and calcium and vitamin D use.  She will return to clinic in approximately 6 weeks to assess tolerance.   I have referred her to survivorship.  All questions were answered. The patient knows to call the clinic with any problems, questions or concerns.  This document serves as a record of services personally performed by Ancil Linsey, MD. It was created on her behalf by Arlyce Harman, a trained medical scribe. The creation of this record is  based on the scribe's personal observations and the provider's statements to  them. This document has been checked and approved by the attending provider.  I have reviewed the above documentation for accuracy and completeness, and I agree with the above.  This note was electronically signed.    Molli Hazard, MD  03/18/2016 11:17 PM

## 2016-02-25 ENCOUNTER — Ambulatory Visit: Payer: Medicare Other

## 2016-02-25 ENCOUNTER — Encounter (HOSPITAL_COMMUNITY): Payer: Self-pay

## 2016-02-25 ENCOUNTER — Encounter (HOSPITAL_COMMUNITY): Payer: Medicare Other | Attending: Hematology & Oncology | Admitting: Hematology & Oncology

## 2016-02-25 ENCOUNTER — Encounter (HOSPITAL_COMMUNITY): Payer: Self-pay | Admitting: Hematology & Oncology

## 2016-02-25 VITALS — BP 114/52 | HR 102 | Temp 98.9°F | Resp 18 | Wt 186.4 lb

## 2016-02-25 DIAGNOSIS — C50211 Malignant neoplasm of upper-inner quadrant of right female breast: Secondary | ICD-10-CM | POA: Diagnosis not present

## 2016-02-25 DIAGNOSIS — Z170 Estrogen receptor positive status [ER+]: Secondary | ICD-10-CM

## 2016-02-25 MED ORDER — ANASTROZOLE 1 MG PO TABS: 1.0000 mg | ORAL_TABLET | Freq: Every day | ORAL | 1 refills | Status: DC

## 2016-02-25 NOTE — Patient Instructions (Signed)
[place] at [facility] Discharge Instructions  RECOMMENDATIONS MADE BY THE CONSULTANT AND ANY TEST RESULTS WILL BE SENT TO YOUR REFERRING PHYSICIAN.  You saw Dr. Whitney Muse today. You are being referred to [facility] at [facility]. Return to clinic in 6 weeks for labs and follow up.   Thank you for choosing [place] at [facility] to provide your oncology and hematology care.  To afford each patient quality time with our provider, please arrive at least 15 minutes before your scheduled appointment time.   Beginning January 23rd 2017 lab work for the [facility] will be done in the  Main lab at [facility] on 1st floor. If you have a lab appointment with the [facility] please come in thru the  Main Entrance and check in at the main information desk  You need to re-schedule your appointment should you arrive 10 or more minutes late.  We strive to give you quality time with our providers, and arriving late affects you and other patients whose appointments are after yours.  Also, if you no show three or more times for appointments you may be dismissed from the clinic at the providers discretion.     Again, thank you for choosing [facility].  Our hope is that these requests will decrease the amount of time that you wait before being seen by our physicians.       _____________________________________________________________  Should you have questions after your visit to [facility], please contact our office at [phone] between the hours of 8:30 a.m. and 4:30 p.m.  Voicemails left after 4:30 p.m. will not be returned until the following business day.  For prescription refill requests, have your pharmacy contact our office.         Resources For Cancer Patients and their Caregivers ? American Cancer Society: Can assist with transportation, wigs, general needs, runs Look Good Feel Better.         6390043874 ? Cancer Care: Provides financial assistance, online support groups, medication/co-pay assistance.  1-800-813-HOPE 660 785 7136) ? [place] Assists [place] cancer patients and their families through emotional , educational and financial support.  865-870-7138 ? [place] DSS Where to apply for food stamps, Medicaid and utility assistance. 670-169-0860 ? RCATS: Transportation to medical appointments. 612 682 0260 ? Social Security Administration: May apply for disability if have a Stage IV cancer. 845-214-9674 3523822443 ? [employer], Disability and Transit Services: Assists with nutrition, care and transit needs. [place] Support Programs: @10RELATIVEDAYS @ > Cancer Support Group  2nd Tuesday of the month 1pm-2pm, Journey Room  > Creative Journey  3rd Tuesday of the month 1130am-1pm, Journey Room  > Look Good Feel Better  1st Wednesday of the month 10am-12 noon, Journey Room (Call [place] to register [phone])

## 2016-03-08 DIAGNOSIS — R935 Abnormal findings on diagnostic imaging of other abdominal regions, including retroperitoneum: Secondary | ICD-10-CM | POA: Diagnosis not present

## 2016-03-14 ENCOUNTER — Telehealth: Payer: Self-pay

## 2016-03-14 ENCOUNTER — Other Ambulatory Visit (HOSPITAL_COMMUNITY): Payer: Self-pay | Admitting: Hematology & Oncology

## 2016-03-14 DIAGNOSIS — C50211 Malignant neoplasm of upper-inner quadrant of right female breast: Secondary | ICD-10-CM

## 2016-03-14 NOTE — Telephone Encounter (Signed)
Pt called stating she needs to change her appt with survivorship from the 21st at 1130 d/t the lunar eclipse and a baby shower. In basket to schedulers sent

## 2016-03-17 ENCOUNTER — Encounter: Payer: Self-pay | Admitting: Radiation Oncology

## 2016-03-17 ENCOUNTER — Ambulatory Visit
Admission: RE | Admit: 2016-03-17 | Discharge: 2016-03-17 | Disposition: A | Discharge status: Home / Self Care | Payer: Medicare Other | Source: Ambulatory Visit | Attending: Radiation Oncology | Admitting: Radiation Oncology

## 2016-03-17 VITALS — BP 127/77 | HR 81 | Temp 98.7°F | Resp 18 | Ht 63.0 in | Wt 181.5 lb

## 2016-03-17 DIAGNOSIS — Y842 Radiological procedure and radiotherapy as the cause of abnormal reaction of the patient, or of later complication, without mention of misadventure at the time of the procedure: Secondary | ICD-10-CM | POA: Insufficient documentation

## 2016-03-17 DIAGNOSIS — C50211 Malignant neoplasm of upper-inner quadrant of right female breast: Secondary | ICD-10-CM

## 2016-03-17 NOTE — Progress Notes (Signed)
Caitlin [name]  is here for a one month follow up visit for right breast cancer    Skin status:Mild hyperpigmentation to right breast. Lotion: Still using Radiaplex gel daily to right breast Have you had a mammogram?: No Have you seen your medical oncologist? Date If not ,when is appointment?04-07-16 Dr. Ancil Linsey Survivorship appointment:03-21-16 [name], PA-C Started 02-16-16 Anastrozole Arm movement:Able to raise right arm without difficulty Appetite:Good Pain:None Fatigue:Energy level is good no fatigue BP 127/77 (BP Location: Left Arm, Patient Position: Sitting, Cuff Size: Normal)   Pulse 81   Temp 98.7 F (37.1 C) (Oral)   Resp 18   Ht 5\' 3"  (1.6 m)   Wt 181 lb 8 oz (82.3 kg)   SpO2 100%   BMI 32.15 kg/m

## 2016-03-17 NOTE — Progress Notes (Signed)
Radiation Oncology         [phone] ________________________________  Name: Caitlin [name] MRN: [id number]  Date: 03/17/2016  DOB: [birth year]    Follow-Up Visit Note  CC: [name], MD  [name], MD  Diagnosis:   70 y.o. woman with [id number] of the right breast    ICD-9-CM ICD-10-CM   1. Breast cancer of upper-inner quadrant of right female breast (HCC) 174.2 C50.211     Interval Since Last Radiation:  1 months ; 01/11/16 - 02/04/16  Narrative:  The patient returns today for routine follow-up. Still using radiaplex gel daily to right breast. Mild hyperpigmentation to right breast. She has not had a mammogram. Started anastrozole 02/16/16. She is able to raise her arm without difficulty. Her appetite is good. Denies pain. Her energy level is good with no fatigue.                               ALLERGIES:  is allergic to tetanus toxoids and tetanus toxoid adsorbed.  Meds: Current Outpatient Prescriptions  Medication Sig Dispense Refill  . anastrozole (ARIMIDEX) 1 MG tablet TAKE ONE TABLET BY MOUTH ONCE DAILY. 30 tablet 0  . B Complex Vitamins (B-COMPLEX/B-12 SL) Place 1,000 mcg/day under the tongue daily.    . Calcium Citrate-Vitamin D (CALCIUM CITRATE CHEWY BITE) 500-500 MG-UNIT CHEW Chew 500 mg by mouth. One soft chews twice daily    . Calcium-Vitamin D-Iron (RA CALCIUM PLUS IRON/VIT D PO) Take 1 tablet by mouth at bedtime.    [name] dextromethorphan-guaiFENesin (MUCINEX DM) 30-600 MG 12hr tablet Take by mouth.    . docusate sodium (COLACE) 100 MG capsule Take 100 mg by mouth daily. Daily at bedtime    . esomeprazole (NEXIUM) 20 MG capsule Take 20 mg by mouth daily at 12 noon.    . famotidine (PEPCID) 20 MG tablet Take 20 mg by mouth.    . fluticasone-salmeterol (ADVAIR HFA) 115-21 MCG/ACT inhaler Inhale 2 puffs into the lungs 2 (two) times daily.    . magnesium oxide (MAG-OX) 400 MG tablet Take 400 mg by mouth daily.    . Melatonin 3 MG TABS Take 3 mg by mouth.      . Menaquinone-7 (VITAMIN K2) 100 MCG CAPS Take 100 mcg/day by mouth daily after supper.    [name] MILK THISTLE EXTRACT PO Take 1 tablet by mouth. 1000mg     . Multiple Vitamin (MULTIVITAMIN) capsule Take 1 capsule by mouth daily.    . pravastatin (PRAVACHOL) 20 MG tablet Take 20 mg by mouth daily.    [name] telmisartan-hydrochlorothiazide (MICARDIS HCT) 80-25 MG per tablet Take 0.5 tablets by mouth daily.      No current facility-administered medications for this encounter.     Physical Findings: The patient is in no acute distress. Patient is alert and oriented.  height is 5\' 3"  (1.6 m) and weight is 181 lb 8 oz (82.3 kg). Her oral temperature is 98.7 F (37.1 C). Her blood pressure is 127/77 and her pulse is 81. Her respiration is 18 and oxygen saturation is 100%. .  No significant changes.   Lab Findings: Lab Results  Component Value Date   WBC 6.2 12/20/2010   HGB 12.3 12/20/2010   HCT 36.4 12/20/2010   PLT 284 12/20/2010    Lab Results  Component Value Date   NA 140 11/06/2015   K 3.8 11/06/2015   CO2 26 11/06/2015   GLUCOSE  97 11/06/2015   BUN 14 11/06/2015   CREATININE 0.52 11/06/2015   BILITOT 1.0 09/03/2010   ALKPHOS 92 09/03/2010   AST 39 (H) 09/03/2010   ALT 38 (H) 09/03/2010   PROT 7.3 09/03/2010   ALBUMIN 4.0 09/03/2010   CALCIUM 9.2 11/06/2015   ANIONGAP 10 11/06/2015    Radiographic Findings: No results found.  Impression:  The patient is recovering from the effects of radiation.    Plan:  The patient is scheduled to follow up with survivorship on 03/21/16 and medical oncology on 04/07/16. I will see her again for follow up in 6 months.  _____________________________________  Sheral Apley. Tammi Klippel, M.D.  This document serves as a record of services personally performed by [name], MD. It was created on his behalf by Arlyce Harman, a trained medical scribe. The creation of this record is based on the scribe's personal observations and the provider's  statements to them. This document has been checked and approved by the attending provider.

## 2016-03-18 ENCOUNTER — Encounter (HOSPITAL_COMMUNITY): Payer: Self-pay | Admitting: Hematology & Oncology

## 2016-03-21 ENCOUNTER — Encounter: Payer: Medicare Other | Admitting: Adult Health

## 2016-03-23 ENCOUNTER — Other Ambulatory Visit: Payer: Self-pay | Admitting: Adult Health

## 2016-03-23 DIAGNOSIS — N3020 Other chronic cystitis without hematuria: Secondary | ICD-10-CM | POA: Diagnosis not present

## 2016-04-05 ENCOUNTER — Encounter: Payer: Self-pay | Admitting: Adult Health

## 2016-04-05 ENCOUNTER — Ambulatory Visit (INDEPENDENT_AMBULATORY_CARE_PROVIDER_SITE_OTHER): Payer: Medicare Other | Admitting: Adult Health

## 2016-04-05 VITALS — BP 128/70 | HR 82 | Ht 62.5 in | Wt 189.5 lb

## 2016-04-05 DIAGNOSIS — N952 Postmenopausal atrophic vaginitis: Secondary | ICD-10-CM | POA: Diagnosis not present

## 2016-04-05 DIAGNOSIS — N816 Rectocele: Secondary | ICD-10-CM

## 2016-04-05 DIAGNOSIS — Z853 Personal history of malignant neoplasm of breast: Secondary | ICD-10-CM | POA: Diagnosis not present

## 2016-04-05 DIAGNOSIS — Z124 Encounter for screening for malignant neoplasm of cervix: Secondary | ICD-10-CM

## 2016-04-05 DIAGNOSIS — Z1211 Encounter for screening for malignant neoplasm of colon: Secondary | ICD-10-CM

## 2016-04-05 DIAGNOSIS — Z01419 Encounter for gynecological examination (general) (routine) without abnormal findings: Secondary | ICD-10-CM

## 2016-04-05 LAB — HEMOCCULT GUIAC POC 1CARD (OFFICE): FECAL OCCULT BLD: NEGATIVE

## 2016-04-05 NOTE — Patient Instructions (Signed)
Physical in 2 years Mammogram yearly  Colonoscopy per GI Labs with PCP

## 2016-04-05 NOTE — Progress Notes (Signed)
Patient ID: Caitlin [name], female   DOB: Dec 23, 1945, 70 y.o.   MRN: [id number] History of Present Illness:  Caitlin [name] is a 70 year old white female, married in for a well woman gyn exam, she is sp hysterectomy years ago and was diagnosed with breast cancer stage 1 on the right this year and underwent 16 radiation treatments and had lumpectomy and is on arimdex now. PCP is Dr Hilma Favors and she sees Dr Whitney Muse.  Current Medications, Allergies, Past Medical History, Past Surgical History, Family History and Social History were reviewed in [employer] record.     Review of Systems: Patient denies any headaches, hearing loss, fatigue, blurred vision, shortness of breath, chest pain, abdominal pain, problems with bowel movements, urination, or intercourse. No joint pain or mood swings.    Physical Exam:BP 128/70 (BP Location: Left Arm, Patient Position: Sitting, Cuff Size: Normal)   Pulse 82   Ht 5' 2.5" (1.588 m)   Wt 189 lb 8 oz (86 kg)   BMI 34.11 kg/m  General:  Well developed, well nourished, no acute distress Skin:  Warm and dry Neck:  Midline trachea, normal thyroid, good ROM, no lymphadenopathy,no carotid bruits heard Lungs; Clear to auscultation bilaterally Breast:  No dominant palpable mass, retraction, or nipple discharge, has scarring and firmness on right from lumpectomy and radiation  Cardiovascular: Regular rate and rhythm Abdomen:  Soft, non tender, no hepatosplenomegaly, has welled healed scars sp hernia repair, cholecystectomy and gastric sleeve Pelvic:  External genitalia is normal in appearance, no lesions.  The vagina is pale and atrophic. Urethra has no lesions or masses. The cervix and uterus are absent. No adnexal masses or tenderness noted.Bladder is non tender, no masses felt. Rectal: Good sphincter tone, no polyps, or hemorrhoids felt.  Hemoccult negative.+rectocele Extremities/musculoskeletal:  No swelling, she has spider veins and varicose veins  and has had laser in past, no clubbing or cyanosis Psych:  No mood changes, alert and cooperative,seems happy   Impression: Well woman gyn exam no pap Hx breast cancer    Plan: Physical in 2 years Mammogram yearly Colonoscopy per GI Labs with PCP

## 2016-04-07 ENCOUNTER — Encounter (HOSPITAL_COMMUNITY): Payer: Medicare Other | Attending: Hematology & Oncology | Admitting: Hematology & Oncology

## 2016-04-07 ENCOUNTER — Encounter (HOSPITAL_COMMUNITY): Payer: Self-pay | Admitting: Hematology & Oncology

## 2016-04-07 ENCOUNTER — Encounter (HOSPITAL_COMMUNITY): Payer: Medicare Other

## 2016-04-07 VITALS — BP 124/81 | HR 72 | Temp 98.8°F | Resp 16 | Wt 188.8 lb

## 2016-04-07 DIAGNOSIS — D7589 Other specified diseases of blood and blood-forming organs: Secondary | ICD-10-CM | POA: Diagnosis not present

## 2016-04-07 DIAGNOSIS — Z79811 Long term (current) use of aromatase inhibitors: Secondary | ICD-10-CM

## 2016-04-07 DIAGNOSIS — Z79899 Other long term (current) drug therapy: Secondary | ICD-10-CM | POA: Insufficient documentation

## 2016-04-07 DIAGNOSIS — E785 Hyperlipidemia, unspecified: Secondary | ICD-10-CM | POA: Insufficient documentation

## 2016-04-07 DIAGNOSIS — K219 Gastro-esophageal reflux disease without esophagitis: Secondary | ICD-10-CM | POA: Diagnosis not present

## 2016-04-07 DIAGNOSIS — C50211 Malignant neoplasm of upper-inner quadrant of right female breast: Secondary | ICD-10-CM

## 2016-04-07 DIAGNOSIS — I10 Essential (primary) hypertension: Secondary | ICD-10-CM | POA: Insufficient documentation

## 2016-04-07 LAB — CBC WITH DIFFERENTIAL/PLATELET
BASOS PCT: 1 %
Basophils Absolute: 0 10*3/uL (ref 0.0–0.1)
EOS PCT: 7 %
Eosinophils Absolute: 0.3 10*3/uL (ref 0.0–0.7)
HEMATOCRIT: 37.2 % (ref 36.0–46.0)
Hemoglobin: 12.6 g/dL (ref 12.0–15.0)
LYMPHS PCT: 24 %
Lymphs Abs: 1.2 10*3/uL (ref 0.7–4.0)
MCH: 35 pg — ABNORMAL HIGH (ref 26.0–34.0)
MCHC: 33.9 g/dL (ref 30.0–36.0)
MCV: 103.3 fL — AB (ref 78.0–100.0)
MONO ABS: 0.5 10*3/uL (ref 0.1–1.0)
MONOS PCT: 10 %
NEUTROS ABS: 3 10*3/uL (ref 1.7–7.7)
Neutrophils Relative %: 60 %
PLATELETS: 183 10*3/uL (ref 150–400)
RBC: 3.6 MIL/uL — ABNORMAL LOW (ref 3.87–5.11)
RDW: 12.3 % (ref 11.5–15.5)
WBC: 5.1 10*3/uL (ref 4.0–10.5)

## 2016-04-07 LAB — COMPREHENSIVE METABOLIC PANEL
ALT: 40 U/L (ref 14–54)
ANION GAP: 8 (ref 5–15)
AST: 68 U/L — AB (ref 15–41)
Albumin: 3.9 g/dL (ref 3.5–5.0)
Alkaline Phosphatase: 107 U/L (ref 38–126)
BILIRUBIN TOTAL: 0.5 mg/dL (ref 0.3–1.2)
BUN: 9 mg/dL (ref 6–20)
CHLORIDE: 102 mmol/L (ref 101–111)
CO2: 28 mmol/L (ref 22–32)
Calcium: 9.5 mg/dL (ref 8.9–10.3)
Creatinine, Ser: 0.57 mg/dL (ref 0.44–1.00)
Glucose, Bld: 98 mg/dL (ref 65–99)
POTASSIUM: 4.1 mmol/L (ref 3.5–5.1)
Sodium: 138 mmol/L (ref 135–145)
TOTAL PROTEIN: 6.9 g/dL (ref 6.5–8.1)

## 2016-04-07 NOTE — Patient Instructions (Addendum)
[place] at [facility] Discharge Instructions  RECOMMENDATIONS MADE BY THE CONSULTANT AND ANY TEST RESULTS WILL BE SENT TO YOUR REFERRING PHYSICIAN.  You saw Dr. Whitney Muse today. Follow up in 3 months. Survivorship will be scheduled here at [facility]  Thank you for choosing [place] at [facility] to provide your oncology and hematology care.  To afford each patient quality time with our provider, please arrive at least 15 minutes before your scheduled appointment time.   Beginning January 23rd 2017 lab work for the [facility] will be done in the  Main lab at [facility] on 1st floor. If you have a lab appointment with the [facility] please come in thru the  Main Entrance and check in at the main information desk  You need to re-schedule your appointment should you arrive 10 or more minutes late.  We strive to give you quality time with our providers, and arriving late affects you and other patients whose appointments are after yours.  Also, if you no show three or more times for appointments you may be dismissed from the clinic at the providers discretion.     Again, thank you for choosing [facility].  Our hope is that these requests will decrease the amount of time that you wait before being seen by our physicians.       _____________________________________________________________  Should you have questions after your visit to [facility], please contact our office at [phone] between the hours of 8:30 a.m. and 4:30 p.m.  Voicemails left after 4:30 p.m. will not be returned until the following business day.  For prescription refill requests, have your pharmacy contact our office.         Resources For Cancer Patients and their Caregivers ? American Cancer Society: Can assist with transportation, wigs, general needs, runs Look Good Feel Better.        860-352-4219 ? Cancer Care: Provides  financial assistance, online support groups, medication/co-pay assistance.  1-800-813-HOPE 763-406-3704) ? [place] Assists [place] cancer patients and their families through emotional , educational and financial support.  (803) 818-4719 ? [place] DSS Where to apply for food stamps, Medicaid and utility assistance. 314-714-0782 ? RCATS: Transportation to medical appointments. 479-275-1067 ? Social Security Administration: May apply for disability if have a Stage IV cancer. 989-587-0835 [phone] ? [employer], Disability and Transit Services: Assists with nutrition, care and transit needs. [place] Support Programs: @10RELATIVEDAYS @ > Cancer Support Group  2nd Tuesday of the month 1pm-2pm, Journey Room  > Creative Journey  3rd Tuesday of the month 1130am-1pm, Journey Room  > Look Good Feel Better  1st Wednesday of the month 10am-12 noon, Journey Room (Call [place] to register [phone])

## 2016-04-07 NOTE — Progress Notes (Signed)
[facility]  Progress Note  Patient Care Team: Sharilyn Sites, MD as PCP - General  CHIEF COMPLAINTS/PURPOSE OF CONSULTATION:  pT1cN0M0 ER+ PR+ HER 2 neu - carcinoma of the R breast Lumpectomy/sentinel node biopsy with Dr. Barry Dienes on 11/11/2015    Breast cancer of upper-inner quadrant of right female breast ([place])   09/24/2014 Imaging    DEXA with normal bone density      10/06/2015 Imaging    Screening bilateral mammogram BI-RADS Category 0, R breast possible mass, L breast possible mass      10/20/2015 Imaging    Diagnostic B mammogram, R breast Ultrasound with suspicious mass in the R breast at the 1 o clock location 5 cm from the nipple, LN in the low R axillae with cortex nodularity      10/27/2015 Initial Biopsy    Ultrasound guided biopsy of R axillary LN, BENIGN. Ultrasound guided biopsy of R breast with invasive ductal carcinoma      11/04/2015 Initial Diagnosis    Breast cancer of upper-inner quadrant of right female breast ([place])      11/09/2015 Procedure    Radioactive seed localization R breast      11/11/2015 Surgery    R breast radioactive seed localized lumpectomy and seed targeted sentinel LN biopsy      11/11/2015 Pathology Results    Invasive ductal carcinoma Grade I/III spanning 1.1 cm, lobular neoplasia (LCIS) resection margins negative. 3 negative sentinel nodes ER (100%), PR (100%), Her2 neu negative pT1c, pN0      12/11/2015 Oncotype testing    Recurrence Score result 11, 10 year risk of distant recurrence with tamoxifen alone 8%        HISTORY OF PRESENTING ILLNESS:  Caitlin [name] 70 y.o. female is here because of Stage I infiltrating ductal carcinoma with associated LCIS,  ER+ PR+ and HER2-.   Patient states that she is doing well on Arimidex and does not experience any issues with it. She takes it daily.   She denies any joint pain or swelling in her surgical hand, finger, or arm.  She denies hot flashes. .  She is currently  scheduled for survivorship on Friday, 04/15/2016 in [place].   She has completed radiation and notes she did well. She denies any problems with skin toxicity.    MEDICAL HISTORY:  Past Medical History:  Diagnosis Date  . Asthma   . Breast cancer ([place])   . Breast disorder    cancer  . Breast mass    benign  . GERD (gastroesophageal reflux disease)   . History of hiatal hernia   . Hyperlipidemia   . Hypertension   . Impaired fasting glucose   . Rectocele     SURGICAL HISTORY: Past Surgical History:  Procedure Laterality Date  . BREAST LUMPECTOMY WITH RADIOACTIVE SEED AND SENTINEL LYMPH NODE BIOPSY Right 11/11/2015   Procedure: RIGHT BREAST RADIOACTIVE SEED GUIDED  LUMPECTOMY AND RADIOACTIVE SEED TARGETED SENTINEL LYMPH NODE BIOPSY;  Surgeon: Stark Klein, MD;  Location: [facility];  Service: General;  Laterality: Right;  . BREAST SURGERY Left    breast biopies x4  . CHOLECYSTECTOMY  09/08/2010  . COLONOSCOPY    . COLONOSCOPY N/A 09/25/2013   Procedure: COLONOSCOPY;  Surgeon: Rogene Houston, MD;  Location: [facility];  Service: Endoscopy;  Laterality: N/A;  100-moved to 1200 Ann to notify pt  . HERNIA REPAIR  12/24/2010, 12-2014  . [place] RESECTION  11-2013  at [place]  2007/2008  . TONSILLECTOMY  1968  . UPPER GASTROINTESTINAL ENDOSCOPY    . VAGINAL HYSTERECTOMY  1980s    SOCIAL HISTORY: Social History   Social History  . Marital status: Married    Spouse name: N/A  . Number of children: 2  . Years of education: N/A   Occupational History  . retired Retired    Pharmacist, hospital   Social History Main Topics  . Smoking status: Never Smoker  . Smokeless tobacco: Never Used  . Alcohol use Yes     Comment: occasional wine  . Drug use: No  . Sexual activity: Yes    Birth control/ protection: Surgical     Comment: hyst   Other Topics Concern  . Not on file   Social History Narrative  . No narrative on  file  Married for 44 years. 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  From [place], [place] Father was 42 when he passed had rectal/colon cancer when he was 68 years old. Died from congestive heart failure. Mother was 66 when she passed; had dementia 3 sisters-Older sister had health problems Maternal Grandmother had breast cancer. Natural Menopause during late 7's  FAMILY HISTORY: Family History  Problem Relation Age of Onset  . Colon cancer Father   . Rectal cancer Father   . Prostate cancer Father   . Dementia Mother   . Osteoporosis Mother   . Healthy Daughter   . Healthy Son   . Pancreatic cancer Paternal Grandfather   . Heart disease Paternal Grandmother   . Heart disease Maternal Grandfather   . Breast cancer Maternal Grandmother   . Scoliosis Sister     ALLERGIES:  is allergic to tetanus toxoids and tetanus toxoid adsorbed.  MEDICATIONS:  Current Outpatient Prescriptions  Medication Sig Dispense Refill  . anastrozole (ARIMIDEX) 1 MG tablet TAKE ONE TABLET BY MOUTH ONCE DAILY. 30 tablet 0  . B Complex Vitamins (B-COMPLEX/B-12 SL) Place 1,000 mcg/day under the tongue daily.    . Calcium Citrate-Vitamin D (CALCIUM CITRATE CHEWY BITE) 500-500 MG-UNIT CHEW Chew 500 mg by mouth. One soft chews twice daily    . Calcium-Vitamin D-Iron (RA CALCIUM PLUS IRON/VIT D PO) Take 1 tablet by mouth at bedtime.    [name] dextromethorphan-guaiFENesin (MUCINEX DM) 30-600 MG 12hr tablet Take 1 tablet by mouth as needed.     . docusate sodium (COLACE) 100 MG capsule Take 100 mg by mouth daily. Daily at bedtime    . esomeprazole (NEXIUM) 20 MG capsule Take 20 mg by mouth daily at 12 noon.    . famotidine (PEPCID) 20 MG tablet Take 20 mg by mouth daily.     . fluticasone-salmeterol (ADVAIR HFA) 115-21 MCG/ACT inhaler Inhale 2 puffs into the lungs  as needed.     . magnesium oxide (MAG-OX) 400 MG tablet Take 400 mg by mouth daily.    . Melatonin 3 MG TABS Take 3 mg by mouth as needed.     . Menaquinone-7 (VITAMIN K2) 100 MCG CAPS Take 100 mcg/day by mouth daily after supper.    [name] MILK THISTLE EXTRACT PO Take 1 tablet by mouth daily. 1074m    . Multiple Vitamin (MULTIVITAMIN) capsule Take 2 capsules by mouth daily.     . pravastatin (PRAVACHOL) 20 MG tablet Take 20  mg by mouth daily.    [name] telmisartan-hydrochlorothiazide (MICARDIS HCT) 80-25 MG per tablet Take 0.5 tablets by mouth daily.      No current facility-administered medications for this visit.     Review of Systems  Constitutional: Negative.  Negative for chills, fever, malaise/fatigue and weight loss.  HENT: Negative.  Negative for congestion, hearing loss, nosebleeds, sore throat and tinnitus.   Eyes: Negative.  Negative for blurred vision, double vision, pain and discharge.  Respiratory: Negative.  Negative for cough, hemoptysis, sputum production, shortness of breath and wheezing.   Cardiovascular: Negative.  Negative for chest pain, palpitations, claudication, leg swelling and PND.  Gastrointestinal: Negative.  Negative for abdominal pain, blood in stool, constipation, diarrhea, heartburn, melena, nausea and vomiting.  Genitourinary: Negative.  Negative for dysuria, frequency, hematuria and urgency.  Musculoskeletal: Negative.  Negative for falls, joint pain and myalgias.  Skin: Negative.  Negative for itching and rash.  Neurological: Negative.  Negative for dizziness, tingling, tremors, sensory change, speech change, focal weakness, seizures, loss of consciousness, weakness and headaches.  Endo/Heme/Allergies: Negative.  Does not bruise/bleed easily.  Psychiatric/Behavioral: Negative.  Negative for depression, memory loss, substance abuse and suicidal ideas. The patient is not nervous/anxious and does not have insomnia.   All other systems reviewed and are negative. 14  point ROS was done and is otherwise as detailed above or in HPI   PHYSICAL EXAMINATION: ECOG PERFORMANCE STATUS: 0 - Asymptomatic  Vitals:   04/07/16 1041  BP: 124/81  Pulse: 72  Resp: 16  Temp: 98.8 F (37.1 C)   Filed Weights   04/07/16 1041  Weight: 188 lb 12.8 oz (85.6 kg)    Physical Exam  Constitutional: She is oriented to person, place, and time and well-developed, well-nourished, and in no distress.  Wears glasses  HENT:  Head: Normocephalic and atraumatic.  Nose: Nose normal.  Mouth/Throat: Oropharynx is clear and moist. No oropharyngeal exudate.  Eyes: Conjunctivae and EOM are normal. Pupils are equal, round, and reactive to light. Right eye exhibits no discharge. Left eye exhibits no discharge. No scleral icterus.  Neck: Normal range of motion. Neck supple. No tracheal deviation present. No thyromegaly present.  Cardiovascular: Normal rate, regular rhythm and normal heart sounds.  Exam reveals no gallop and no friction rub.   No murmur heard. Pulmonary/Chest: Effort normal and breath sounds normal. She has no wheezes. She has no rales.  Abdominal: Soft. Bowel sounds are normal. She exhibits no distension and no mass. There is no tenderness. There is no rebound and no guarding.  Musculoskeletal: Normal range of motion. She exhibits no edema.  Lymphadenopathy:    She has no cervical adenopathy.  Neurological: She is alert and oriented to person, place, and time. She has normal reflexes. No cranial nerve deficit. Gait normal. Coordination normal.  Skin: Skin is warm and dry. No rash noted.  Psychiatric: Mood, memory, affect and judgment normal.  Nursing note and vitals reviewed.    LABORATORY DATA:  I have reviewed the data as listed Lab Results  Component Value Date   WBC 5.1 04/07/2016   HGB 12.6 04/07/2016   HCT 37.2 04/07/2016   MCV 103.3 (H) 04/07/2016   PLT 183 04/07/2016   CMP     Component Value Date/Time   NA 138 04/07/2016 1009   K 4.1  04/07/2016 1009   CL 102 04/07/2016 1009   CO2 28 04/07/2016 1009   GLUCOSE 98 04/07/2016 1009   BUN 9 04/07/2016 1009   CREATININE  0.57 04/07/2016 1009   CALCIUM 9.5 04/07/2016 1009   PROT 6.9 04/07/2016 1009   ALBUMIN 3.9 04/07/2016 1009   AST 68 (H) 04/07/2016 1009   ALT 40 04/07/2016 1009   ALKPHOS 107 04/07/2016 1009   BILITOT 0.5 04/07/2016 1009   GFRNONAA >60 04/07/2016 1009   GFRAA >60 04/07/2016 1009   RADIOGRAPHIC STUDIES: I have personally reviewed the radiological images as listed and agreed with the findings in the report.  No results found.  Study Result   CLINICAL DATA:  Biopsy proven invasive ductal carcinoma of the right breast. Axillary lymph node biopsy showed benign adipose tissue with no lymphoid tissue identified. Surgical excision of the lymph node was recommended.  EXAM: SPECIMEN RADIOGRAPH OF THE RIGHT AXILLA.  COMPARISON:  Previous exam(s).  FINDINGS: Status post excision of the right axilla. The radioactive seed and biopsy marker clip are present, completely intact, and were marked for pathology.  IMPRESSION: Specimen radiograph of the right axilla.   Electronically Signed   By: Lillia Mountain M.D.   On: 11/11/2015 12:46     PATHOLOGY:      ASSESSMENT & PLAN:  pT1cN0M0 ER+ PR+ HER 2 neu - carcinoma of the R breast, upper inner quadrant Lumpectomy/sentinel node biopsy with Dr. Barry Dienes on 11/11/2015 Bone Density 09/2014 NORMAL Adjuvant XRT ONCOTYPE low risk Macrocytosis   She is to continue on arimidex. She seems to be tolerating therapy well. Planned treatment duration is 5 years.  She is up to date with DEXA and had normal bone density. She was instructed on weight bearing exercise and calcium and vitamin D use.  I have referred her to survivorship here at [facility] given that she lives locally, she can meet with Elzie Rings on the 19th.   Macrocytosis is noted on her CBC, B12 and folate should be checked at follow-up.  RTC in 3  months.   All questions were answered. The patient knows to call the clinic with any problems, questions or concerns.  This document serves as a record of services personally performed by Ancil Linsey, MD. It was created on her behalf by Elmyra Ricks, a trained medical scribe. The creation of this record is based on the scribe's personal observations and the provider's statements to them. This document has been checked and approved by the attending provider.  I have reviewed the above documentation for accuracy and completeness, and I agree with the above.  This note was electronically signed.    Molli Hazard, MD  04/07/2016 11:18 AM

## 2016-04-10 ENCOUNTER — Encounter (HOSPITAL_COMMUNITY): Payer: Self-pay | Admitting: Hematology & Oncology

## 2016-04-15 ENCOUNTER — Encounter: Payer: Medicare Other | Admitting: Adult Health

## 2016-04-19 ENCOUNTER — Encounter: Payer: Self-pay | Admitting: *Deleted

## 2016-04-19 ENCOUNTER — Encounter (HOSPITAL_COMMUNITY): Payer: Self-pay | Admitting: Adult Health

## 2016-04-19 ENCOUNTER — Other Ambulatory Visit (HOSPITAL_COMMUNITY): Payer: Self-pay | Admitting: Hematology & Oncology

## 2016-04-19 ENCOUNTER — Encounter (HOSPITAL_BASED_OUTPATIENT_CLINIC_OR_DEPARTMENT_OTHER): Payer: Medicare Other | Admitting: Adult Health

## 2016-04-19 DIAGNOSIS — Z170 Estrogen receptor positive status [ER+]: Secondary | ICD-10-CM | POA: Diagnosis not present

## 2016-04-19 DIAGNOSIS — Z79811 Long term (current) use of aromatase inhibitors: Secondary | ICD-10-CM

## 2016-04-19 DIAGNOSIS — C50211 Malignant neoplasm of upper-inner quadrant of right female breast: Secondary | ICD-10-CM | POA: Diagnosis not present

## 2016-04-19 MED ORDER — ANASTROZOLE 1 MG PO TABS: 1.0000 mg | ORAL_TABLET | Freq: Every day | ORAL | 3 refills | Status: DC

## 2016-04-19 NOTE — Progress Notes (Signed)
[facility] CLINICAL SOCIAL WORK PSYCHOSOCIAL ASSESSMENT [facility]   Date:  04/19/16   First Name: Caitlin [name]   M.I.: T     Last Name: [name] MRN:  [id number]    Primary Cancer Type: Breast cancer of upper-inner quadrant of right female breast [facility])   Staging form: Breast, AJCC 7th Edition   - Pathologic stage from 11/11/2015: Stage IA (T1c, N0, cM0) - Signed by Holley Bouche, NP on 04/18/2016        Marital Status: Married  Practical Problems: None Identified     Employment:  retired   Scientist, physiological of Income: Conservation officer, historic buildings and retirement    Insurance: medicare    Family Problems: yes  None Identified Concerns caring for family needs: None Identified    Emotional Problems: no  Concerns of Adjustment to Diagnosis/Treatment: None Identified   Current Symptoms of Anxiety: No symptoms identified   Current Symptoms of Depression: No symptoms identified  Safety/Risk Concerns: None identified   Mental Status Exam Orientation: person, place, and time Affect: appropriate Thought: normal      Spiritual/Religious: None identified  Living Situation: with spouse  Functional Status: Independent                                                                                               Strengths and Barriers To Treatment:   Patient Coping Strengths:   Supportive Relationships, Family, Friends, Church, [employer], Spirituality, Hopefulness, Conservator, museum/gallery and Able to Communicate Effectively                                                                                                  Identified Problems/Needs and Barriers to Care:  No identified problems    Counseling and Social Work Interventions and Recommendations:   Software engineer. Available supports, education in community as pt moves forward in Survivorship   Impressions/Plan:  CSW met with pt along with [name], NP at acute survivorship  visit to review any possible emotional concerns and coping due to acute survivorship. Pt educated on possible acute survivorship emotions and concerns. Pt reports to be doing very well currently and denied any concerns. 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 She reports to live with her husband of 50 years, who is very supportive of her. They have two grown children and four grandchildren. Pt is very active in the local community and volunteers through her church, [employer] and other agencies. Pt reports both she and her husband are doing well and stay very busy. Pt denied any concerns recently and agrees to reach out as needed.   Caitlin [name], [place]  Tuesdays   [phone]

## 2016-04-19 NOTE — Progress Notes (Signed)
[place], [place] [address]   CLINIC:  Survivorship  REASON FOR VISIT:  Survivorship Care Plan visit & to address acute survivorship needs with Survivorship NP & Oncology Clinical Social Worker   BRIEF ONCOLOGY HISTORY:    Breast cancer of upper-inner quadrant of right female breast ([place])   09/24/2014 Imaging    DEXA with normal bone density      10/06/2015 Imaging    Screening bilateral mammogram BI-RADS Category 0, R breast possible mass, L breast possible mass      10/20/2015 Imaging    Diagnostic B mammogram, R breast Ultrasound with suspicious mass in the R breast at the 1 o clock location 5 cm from the nipple, LN in the low R axillae with cortex nodularity      10/27/2015 Initial Biopsy    Ultrasound guided biopsy of R axillary LN, BENIGN. Ultrasound guided biopsy of R breast with invasive ductal carcinoma      11/04/2015 Initial Diagnosis    Breast cancer of upper-inner quadrant of right female breast ([place])      11/09/2015 Procedure    Radioactive seed localization R breast      11/11/2015 Surgery    R breast radioactive seed localized lumpectomy and seed targeted sentinel LN biopsy      11/11/2015 Pathology Results    Invasive ductal carcinoma Grade I/III spanning 1.1 cm, lobular neoplasia (LCIS) resection margins negative. 3 negative sentinel nodes ER (100%), PR (100%), Her2 neu negative pT1c, pN0      12/11/2015 Oncotype testing    Recurrence Score result 11, 10 year risk of distant recurrence with tamoxifen alone 8% (low-risk)      01/11/2016 - 02/04/2016 Radiation Therapy    Adjuvant breast radiation ([facility]): 42.72 Gy in 16 fractions to the right breast      01/2016 -  Anti-estrogen oral therapy    Anastrazole 1 mg daily. Planned duration of therapy: 5 years.         INTERVAL HISTORY:  Ms. Fabre presents today for our initial meeting and to review her Survivorship Care Plan.  She tells me that she is doing  very well and is largely without physical or emotional concerns since completing treatment for breast cancer.    She started her anti-estrogen therapy with Anastrozole in 01/2016. She denies any hot flashes or vaginal dryness.  She has some joint aches/pains, "but no worse than just old age."  She feels great; she has wonderful support in her family and friends.    Patient seen in conjunction with our oncology social worker, Loren Racer, LCSW.    ADDITIONAL REVIEW OF SYSTEMS:  Review of Systems  Constitutional: Negative.   HENT: Negative.   Eyes: Negative.   Respiratory: Negative.   Cardiovascular: Negative.   Gastrointestinal: Negative.   Genitourinary: Negative.   Musculoskeletal: Negative.   Skin: Negative.   Neurological: Negative.   Endo/Heme/Allergies: Negative.   Psychiatric/Behavioral: Negative.      PAST MEDICAL & SURGICAL HISTORY:  Past Medical History:  Diagnosis Date  . Asthma   . Breast cancer ([place])   . Breast disorder    cancer  . Breast mass    benign  . GERD (gastroesophageal reflux disease)   . History of hiatal hernia   . Hyperlipidemia   . Hypertension   . Impaired fasting glucose   . Rectocele    Past Surgical History:  Procedure Laterality Date  . BREAST LUMPECTOMY WITH RADIOACTIVE SEED AND  SENTINEL LYMPH NODE BIOPSY Right 11/11/2015   Procedure: RIGHT BREAST RADIOACTIVE SEED GUIDED  LUMPECTOMY AND RADIOACTIVE SEED TARGETED SENTINEL LYMPH NODE BIOPSY;  Surgeon: Stark Klein, MD;  Location: [facility];  Service: General;  Laterality: Right;  . BREAST SURGERY Left    breast biopies x4  . CHOLECYSTECTOMY  09/08/2010  . COLONOSCOPY    . COLONOSCOPY N/A 09/25/2013   Procedure: COLONOSCOPY;  Surgeon: Rogene Houston, MD;  [facility];  Service: Endoscopy;  Laterality: N/A;  100-moved to 1200 Ann to notify pt  . HERNIA REPAIR  12/24/2010, 12-2014  . LAPAROSCOPIC GASTRIC SLEEVE RESECTION  11-2013   at [place]  2007/2008  . TONSILLECTOMY  1968  . UPPER GASTROINTESTINAL ENDOSCOPY    . VAGINAL HYSTERECTOMY  1980s     SOCIAL HISTORY: Ms. Andes is married and lives with her husband in [place], [place].  They have 2 adult children (1 son, 1 daughter).  They have 4 grandchildren (ages 78 month, 70, 47, & 3). 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       CURRENT MEDICATIONS:  Current Outpatient Prescriptions on File Prior to Visit  Medication Sig Dispense Refill  . anastrozole (ARIMIDEX) 1 MG tablet TAKE ONE TABLET BY MOUTH ONCE DAILY. 30 tablet 0  . B Complex Vitamins (B-COMPLEX/B-12 SL) Place 1,000 mcg/day under the tongue daily.    . Calcium Citrate-Vitamin D (CALCIUM CITRATE CHEWY BITE) 500-500 MG-UNIT CHEW Chew 500 mg by mouth. One soft chews twice daily    . Calcium-Vitamin D-Iron (RA CALCIUM PLUS IRON/VIT D PO) Take 1 tablet by mouth at bedtime.    [name] dextromethorphan-guaiFENesin (MUCINEX DM) 30-600 MG 12hr tablet Take 1 tablet by mouth as needed.     . docusate sodium (COLACE) 100 MG capsule Take 100 mg by mouth daily. Daily at bedtime    . esomeprazole (NEXIUM) 20 MG capsule Take 20 mg by mouth daily at 12 noon.    . famotidine (PEPCID) 20 MG tablet Take 20 mg by mouth daily.     . fluticasone-salmeterol (ADVAIR HFA) 115-21 MCG/ACT inhaler Inhale 2 puffs into the lungs as needed.     . magnesium oxide (MAG-OX) 400 MG tablet Take 400 mg by mouth daily.    . Melatonin 3 MG TABS Take 3 mg by mouth as needed.     . Menaquinone-7 (VITAMIN K2) 100 MCG CAPS Take 100 mcg/day by mouth daily after supper.    [name] MILK THISTLE EXTRACT PO Take 1 tablet by mouth daily. 1023m    . Multiple Vitamin (MULTIVITAMIN) capsule Take 2 capsules by mouth daily.     . pravastatin (PRAVACHOL) 20 MG tablet Take 20 mg by mouth daily.    .Marland Kitchentelmisartan-hydrochlorothiazide (MICARDIS HCT) 80-25 MG per tablet Take 0.5 tablets by mouth daily.       No current facility-administered medications on file prior to visit.     ALLERGIES: Allergies  Allergen Reactions  . Tetanus Toxoids   . Tetanus Toxoid Adsorbed Palpitations    PHYSICAL EXAM:  General: Female in no acute distress. Unaccompanied today.    HEENT: Head is normocephalic.  Sclerae anicteric. Oral mucosa is pink and moist without lesions.  Cardiovascular: Normal rate and rhythm Respiratory: Clear to auscultation bilaterally. Chest expansion symmetric without accessory muscle use. Breathing non-labored.    GU: Deferred.   GI: Soft, non-tender abdomen. Normoactive bowel sounds.  Neuro:  No focal deficits. Steady gait.   Psych: Normal mood and affect for situation. Extremities: No edema.  Skin: Warm and dry.   LABORATORY DATA: None for this visit.   DIAGNOSTIC IMAGING:  None for this visit.    ASSESSMENT & PLAN:  Ms. Penado is a pleasant 70 y.o. female with history of Stage IA right breast invasive ductal carcinoma, ER+/PR+/HER2-, diagnosed in 09/2015, treated with lumpectomy, followed by adjuvant radiation therapy. She started adjuvant anti-estrogen therapy in 01/2016 with planned duration of therapy 5 years. Patient presents to survivorship clinic today for survivorship care plan visit and to address any acute survivorship concerns since completing treatment.    1. Stage IA right breast cancer:  She will continue follow-up surveillance visits with Dr. Daria Pastures, PA-C.  She will continue her anti-estrogen therapy with anastrozole. She is tolerating the medication very well thus far. Encouraged her to let us know if she begins to experience concerning symptoms and we can see her sooner to help manage those effects, as needed.  Today, she received a copy of her survivorship care plan (SCP) document, which was reviewed with her in detail.  The SCP details her cancer treatment history and potential late/long-term side effects of those treatments.  We discussed the  follow-up schedule she can anticipate with interval imaging for surveillance of her cancer.  I have also shared a copy of her treatment summary/SCP with her PCP.  Ms. Marcantonio will return to the survivorship clinic as needed; she will return to [facility] for surveillance visit with Dr. Cindie Laroche in 3 months.    2. Bone health: Given Ms. Behringer's age, history of breast cancer, and current anti-estrogen therapy with anastrozole, she at risk for bone demineralization.  Her last DEXA scan was done on 09/24/14 and was normal.  Encouraged her to continue taking her calcium and vitamin D supplementation, as well as eating foods rich in calcium.  We discussed the importance of weight-bearing activities and specific activities to promote bone health.  I will defer future DEXA imaging to Dr. Whitney Muse or her PCP, as clinically indicated.    3. Physical activity/Healthy eating: Getting adequate physical activity and maintaining a healthy diet as a cancer survivor is important for overall wellness and reduces the risk of cancer recurrence. We discussed the [facility], which is a fitness program that is offered to cancer survivors free of charge.  We also reviewed "The Nutrition Rainbow" handout, as well as the American Cancer Society's booklet with recommendations for nutrition and physical activity.    4. Health promotion/Cancer screening:   I encouraged her to talk with his PCP about arranging appropriate cancer screening tests, as appropriate.   5. Support services/Counseling: Ms. Koran was seen today in conjunction with Loren Racer, LCSW, in an effort to address both the physical and social concerns of our cancer survivors at [facility].  (Please see LCSW note for additional documentation & recommendations).  It is not uncommon for this period of the patient's cancer care trajectory to be one of many emotions and stressors.  I provided support today through active  listening, validation of concerns, and expressive supportive counseling.  Ms. Godley was encouraged to take advantage of our support services programs and support groups to better cope in her new life as a cancer survivor after completing anti-cancer treatment.    Dispo:  -Return to [facility] to see Dr. Whitney Muse in 07/2016; Anastrazole refilled for next 3 months  to get her to this follow-up appt with Dr. Whitney Muse.  -Return to survivorship clinic as needed; no additional follow-up needed at this time.  -Consider transitioning the patient to long-term survivorship, when clinically appropriate.   A total of 30 minutes was spent in face-to-face care of this patient, with greater than 50% of that time spent in counseling and care coordination.   [name], NP Survivorship Program [place] [phone]

## 2016-05-03 DIAGNOSIS — Z23 Encounter for immunization: Secondary | ICD-10-CM | POA: Diagnosis not present

## 2016-05-17 ENCOUNTER — Other Ambulatory Visit (HOSPITAL_COMMUNITY): Payer: Self-pay | Admitting: Emergency Medicine

## 2016-05-17 DIAGNOSIS — D7589 Other specified diseases of blood and blood-forming organs: Secondary | ICD-10-CM

## 2016-05-17 NOTE — Progress Notes (Signed)
Called pt to notify her that we needed to bring her in for additional lab work.  Made appt, put in the lab orders, and

## 2016-05-18 ENCOUNTER — Ambulatory Visit (INDEPENDENT_AMBULATORY_CARE_PROVIDER_SITE_OTHER): Payer: Medicare Other | Admitting: Orthopaedic Surgery

## 2016-05-18 DIAGNOSIS — M1712 Unilateral primary osteoarthritis, left knee: Secondary | ICD-10-CM

## 2016-05-18 DIAGNOSIS — M25562 Pain in left knee: Secondary | ICD-10-CM

## 2016-05-20 DIAGNOSIS — H47021 Hemorrhage in optic nerve sheath, right eye: Secondary | ICD-10-CM | POA: Diagnosis not present

## 2016-05-23 ENCOUNTER — Encounter (HOSPITAL_COMMUNITY): Payer: Medicare Other | Attending: Hematology & Oncology

## 2016-05-23 DIAGNOSIS — D7589 Other specified diseases of blood and blood-forming organs: Secondary | ICD-10-CM | POA: Diagnosis not present

## 2016-05-23 LAB — VITAMIN B12: VITAMIN B 12: 1189 pg/mL — AB (ref 180–914)

## 2016-05-23 LAB — FOLATE: FOLATE: 22.4 ng/mL (ref >5.9)

## 2016-05-26 ENCOUNTER — Encounter (INDEPENDENT_AMBULATORY_CARE_PROVIDER_SITE_OTHER): Payer: Self-pay | Admitting: Orthopaedic Surgery

## 2016-05-26 ENCOUNTER — Ambulatory Visit (INDEPENDENT_AMBULATORY_CARE_PROVIDER_SITE_OTHER): Payer: Medicare Other | Admitting: Orthopaedic Surgery

## 2016-05-26 DIAGNOSIS — M2242 Chondromalacia patellae, left knee: Secondary | ICD-10-CM | POA: Diagnosis not present

## 2016-05-26 MED ORDER — LIDOCAINE HCL 1 % IJ SOLN
1.0000 mL | INTRAMUSCULAR | Status: AC | PRN
  Administered 2016-05-26: 1 mL

## 2016-05-26 MED ORDER — METHYLPREDNISOLONE ACETATE 40 MG/ML IJ SUSP
40.0000 mg | INTRAMUSCULAR | Status: AC | PRN
  Administered 2016-05-26: 40 mg via INTRA_ARTICULAR

## 2016-05-26 MED ORDER — BUPIVACAINE HCL 0.25 % IJ SOLN
0.6600 mL | INTRAMUSCULAR | Status: AC | PRN
  Administered 2016-05-26: .66 mL via INTRA_ARTICULAR

## 2016-05-26 NOTE — Progress Notes (Signed)
Office Visit Note   Patient: Caitlin [name]           Date of Birth: 12-31-1945           MRN: [id number] Visit Date: 05/26/2016              Requested by: Sharilyn Sites, MD [address] [place], [place] [address] PCP: [name], MD   Assessment & Plan: Visit Diagnoses:  1. Chondromalacia patellae, left knee     Plan: Left knee injection performed with cortisone and bupivacaine. She tolerated the injection well she will return to see Dr. Durward Fortes she's having persistent problems.  Follow-Up Instructions: Return if symptoms worsen or fail to improve.   Orders:  Orders Placed This Encounter  Procedures  . Large Joint Injection/Arthrocentesis   No orders of the defined types were placed in this encounter.     Procedures: Large Joint Inj Date/Time: 05/26/2016 11:41 AM Performed by: Marybelle Killings Authorized by: Rodell Perna C   Consent Given by:  Patient Indications:  Pain and joint swelling Location:  Knee Site:  L knee Needle Size:  22 G Needle Length:  1.5 inches Approach:  Anterolateral Ultrasound Guidance: No   Fluoroscopic Guidance: No   Arthrogram: No Medications:  1 mL lidocaine 1 %; 40 mg methylPREDNISolone acetate 40 MG/ML; 0.66 mL bupivacaine 0.25 % Patient tolerance:  Patient tolerated the procedure well with no immediate complications     Clinical Data: No additional findings.   Subjective: Chief Complaint  Patient presents with  . Left Knee - Pain    Patient has had osteoarthritis pain left knee x 3 weeks. She saw Dr. Durward Fortes on 05/18/16.  She is requesting an injection today. She has tried ice, aleve, heat with no relief.  She did have xrays made at her last visit.     Review of Systemsupdated and unchanged from last weeks visit.    Objective: Vital Signs: BP 135/82   Pulse 76   Ht 5\' 3"  (1.6 m)   Wt 180 lb (81.6 kg)   BMI 31.89 kg/m   Physical Exam  Constitutional: She is oriented to person, place, and time. She  appears well-developed.  HENT:  Head: Normocephalic.  Right Ear: External ear normal.  Left Ear: External ear normal.  Eyes: Pupils are equal, round, and reactive to light.  Neck: No tracheal deviation present. No thyromegaly present.  Cardiovascular: Normal rate.   Pulmonary/Chest: Effort normal.  Abdominal: Soft.  Neurological: She is alert and oriented to person, place, and time.  Skin: Skin is warm and dry.  Psychiatric: She has a normal mood and affect. Her behavior is normal.    Ortho Exam persistent left knee swelling with synovitis pain with ambulation crepitus with knee range of motion collateral cruciate ligament exam is normal distal pulses are intact no pain with hip range of motion negative popliteal compression test no palpable Baker's cyst. Multiple superficial dilated veins in the saphenous system both legs  Specialty Comments:  No specialty comments available.  Imaging: No results found.   PMFS History: Patient Active Problem List   Diagnosis Date Noted  . Chondromalacia patellae, left knee 05/26/2016  . Breast cancer of upper-inner quadrant of right female breast ([place]) 11/04/2015  . GERD (gastroesophageal reflux disease) 09/02/2013  . Obesity 09/02/2013  . Ventral hernia, recurrent 09/02/2013  . Unspecified asthma(493.90) 12/12/2012  . Cough 03/27/2012  . Right middle lobe syndrome 03/27/2012   Past Medical History:  Diagnosis Date  .  Asthma   . Breast cancer ([place])   . Breast disorder    cancer  . Breast mass    benign  . GERD (gastroesophageal reflux disease)   . History of hiatal hernia   . Hyperlipidemia   . Hypertension   . Impaired fasting glucose   . Rectocele     Family History  Problem Relation Age of Onset  . Colon cancer Father   . Rectal cancer Father   . Prostate cancer Father   . Dementia Mother   . Osteoporosis Mother   . Healthy Daughter   . Healthy Son   . Pancreatic cancer Paternal Grandfather   . Heart disease Paternal  Grandmother   . Heart disease Maternal Grandfather   . Breast cancer Maternal Grandmother   . Scoliosis Sister     Past Surgical History:  Procedure Laterality Date  . BREAST LUMPECTOMY WITH RADIOACTIVE SEED AND SENTINEL LYMPH NODE BIOPSY Right 11/11/2015   Procedure: RIGHT BREAST RADIOACTIVE SEED GUIDED  LUMPECTOMY AND RADIOACTIVE SEED TARGETED SENTINEL LYMPH NODE BIOPSY;  Surgeon: Stark Klein, MD;  Location: [facility];  Service: General;  Laterality: Right;  . BREAST SURGERY Left    breast biopies x4  . CHOLECYSTECTOMY  09/08/2010  . COLONOSCOPY    . COLONOSCOPY N/A 09/25/2013   Procedure: COLONOSCOPY;  Surgeon: Rogene Houston, MD;  [facility];  Service: Endoscopy;  Laterality: N/A;  100-moved to 1200 Ann to notify pt  . HERNIA REPAIR  12/24/2010, 12-2014  . LAPAROSCOPIC GASTRIC SLEEVE RESECTION  11-2013   at [place]  2007/2008  . TONSILLECTOMY  1968  . UPPER GASTROINTESTINAL ENDOSCOPY    . VAGINAL HYSTERECTOMY  1980s   Social History   Occupational History  . retired Retired    Pharmacist, hospital   Social History Main Topics  . Smoking status: Never Smoker  . Smokeless tobacco: Never Used  . Alcohol use Yes     Comment: occasional wine  . Drug use: No  . Sexual activity: Yes    Birth control/ protection: Surgical     Comment: hyst

## 2016-06-08 ENCOUNTER — Ambulatory Visit (INDEPENDENT_AMBULATORY_CARE_PROVIDER_SITE_OTHER): Payer: Medicare Other | Admitting: Orthopaedic Surgery

## 2016-06-08 ENCOUNTER — Encounter (INDEPENDENT_AMBULATORY_CARE_PROVIDER_SITE_OTHER): Payer: Self-pay | Admitting: Orthopaedic Surgery

## 2016-06-08 VITALS — BP 111/57 | HR 95 | Resp 14 | Ht 63.0 in | Wt 180.0 lb

## 2016-06-08 DIAGNOSIS — G8929 Other chronic pain: Secondary | ICD-10-CM

## 2016-06-08 DIAGNOSIS — M25562 Pain in left knee: Secondary | ICD-10-CM | POA: Diagnosis not present

## 2016-06-08 NOTE — Progress Notes (Signed)
Office Visit Note   Patient: Caitlin [name]           Date of Birth: 08-Jul-1946           MRN: [id number] Visit Date: 06/08/2016              Requested by: Caitlin Sites, MD [address] [place], [place] [address] PCP: Caitlin Kilts, MD   Assessment & Plan: Visit Diagnoses:  1. Chronic pain of left knee     Plan:   Follow-Up Instructions: No Follow-up on file.   Orders:  Orders Placed This Encounter  Procedures  . MR Knee Left w/o contrast   No orders of the defined types were placed in this encounter.     Procedures: No procedures performed   Clinical Data: No additional findings.   Subjective: Chief Complaint  Patient presents with  . Left Knee - Follow-up    HPI  Review of Systems   Objective: Vital Signs: There were no vitals taken for this visit.  Physical Exam  Ortho Exam  Specialty Comments:  No specialty comments available.  Imaging: No results found.   PMFS History: Patient Active Problem List   Diagnosis Date Noted  . Chondromalacia patellae, left knee 05/26/2016  . Breast cancer of upper-inner quadrant of right female breast ([place]) 11/04/2015  . GERD (gastroesophageal reflux disease) 09/02/2013  . Obesity 09/02/2013  . Ventral hernia, recurrent 09/02/2013  . Unspecified asthma(493.90) 12/12/2012  . Cough 03/27/2012  . Right middle lobe syndrome 03/27/2012   Past Medical History:  Diagnosis Date  . Asthma   . Breast cancer ([place])   . Breast disorder    cancer  . Breast mass    benign  . GERD (gastroesophageal reflux disease)   . History of hiatal hernia   . Hyperlipidemia   . Hypertension   . Impaired fasting glucose   . Rectocele     Family History  Problem Relation Age of Onset  . Colon cancer Father   . Rectal cancer Father   . Prostate cancer Father   . Dementia Mother   . Osteoporosis Mother   . Healthy Daughter   . Healthy Son   . Pancreatic cancer Paternal Grandfather   . Heart disease  Paternal Grandmother   . Heart disease Maternal Grandfather   . Breast cancer Maternal Grandmother   . Scoliosis Sister     Past Surgical History:  Procedure Laterality Date  . BREAST LUMPECTOMY WITH RADIOACTIVE SEED AND SENTINEL LYMPH NODE BIOPSY Right 11/11/2015   Procedure: RIGHT BREAST RADIOACTIVE SEED GUIDED  LUMPECTOMY AND RADIOACTIVE SEED TARGETED SENTINEL LYMPH NODE BIOPSY;  Surgeon: Stark Klein, MD;  Location: [facility];  Service: General;  Laterality: Right;  . BREAST SURGERY Left    breast biopies x4  . CHOLECYSTECTOMY  09/08/2010  . COLONOSCOPY    . COLONOSCOPY N/A 09/25/2013   Procedure: COLONOSCOPY;  Surgeon: Rogene Houston, MD;  [facility];  Service: Endoscopy;  Laterality: N/A;  100-moved to 1200 Ann to notify pt  . HERNIA REPAIR  12/24/2010, 12-2014  . LAPAROSCOPIC GASTRIC SLEEVE RESECTION  11-2013   at [place]  2007/2008  . TONSILLECTOMY  1968  . UPPER GASTROINTESTINAL ENDOSCOPY    . VAGINAL HYSTERECTOMY  1980s   Social History   Occupational History  . retired Retired    Pharmacist, hospital   Social History Main Topics  . Smoking status: Never Smoker  .  Smokeless tobacco: Never Used  . Alcohol use Yes     Comment: occasional wine  . Drug use: No  . Sexual activity: Yes    Birth control/ protection: Surgical     Comment: hyst   Caitlin [name] returns 1 week status post left knee injection relating that she is "not any better". She denies any interval history of injury or trauma. He continues to have predominantly anterior joint pain associated with swelling. Denies back pain. She's not having any groin discomfort.  She's had prior films demonstrating tricompartmental degenerative changes with considerable change about the patellofemoral joint.  He had a long discussion regarding her knee. I think her predominant problem is at the osteoarthritis. I think for the sake of completeness we need to obtain an MRI scan  of her knee and outside chance that she may have some internal derangement. I discussed this with Caitlin [name] husband and I would like to proceed so we'll set that up and plan to see her back after the scan. On exam of her left knee there was a small joint effusion. She does have patellofemoral crepitation and some medial joint discomfort. There was full knee extension and flexion comparable to the right knee. She denied any groin pain with range of motion straight leg raise was negative.

## 2016-06-08 NOTE — Progress Notes (Signed)
Pt had cortisone shot last Thursday by Lorin [facility] with no chain in her pain.  Schedule [facility] for MRI. No restrictions

## 2016-06-13 ENCOUNTER — Encounter (INDEPENDENT_AMBULATORY_CARE_PROVIDER_SITE_OTHER): Payer: Medicare Other | Admitting: Ophthalmology

## 2016-06-13 DIAGNOSIS — H3563 Retinal hemorrhage, bilateral: Secondary | ICD-10-CM

## 2016-06-13 DIAGNOSIS — H35033 Hypertensive retinopathy, bilateral: Secondary | ICD-10-CM

## 2016-06-13 DIAGNOSIS — H43813 Vitreous degeneration, bilateral: Secondary | ICD-10-CM | POA: Diagnosis not present

## 2016-06-13 DIAGNOSIS — H2513 Age-related nuclear cataract, bilateral: Secondary | ICD-10-CM

## 2016-06-13 DIAGNOSIS — I10 Essential (primary) hypertension: Secondary | ICD-10-CM | POA: Diagnosis not present

## 2016-06-22 ENCOUNTER — Ambulatory Visit (HOSPITAL_COMMUNITY): Payer: Medicare Other

## 2016-06-27 ENCOUNTER — Ambulatory Visit (HOSPITAL_COMMUNITY)
Admission: RE | Admit: 2016-06-27 | Discharge: 2016-06-27 | Disposition: A | Discharge status: Home / Self Care | Payer: Medicare Other | Source: Ambulatory Visit | Attending: Orthopaedic Surgery | Admitting: Orthopaedic Surgery

## 2016-06-27 DIAGNOSIS — M25562 Pain in left knee: Secondary | ICD-10-CM | POA: Insufficient documentation

## 2016-06-27 DIAGNOSIS — G8929 Other chronic pain: Secondary | ICD-10-CM | POA: Diagnosis not present

## 2016-06-27 DIAGNOSIS — M7122 Synovial cyst of popliteal space [Baker], left knee: Secondary | ICD-10-CM | POA: Diagnosis not present

## 2016-06-27 DIAGNOSIS — M25462 Effusion, left knee: Secondary | ICD-10-CM | POA: Diagnosis not present

## 2016-06-27 DIAGNOSIS — S83271A Complex tear of lateral meniscus, current injury, right knee, initial encounter: Secondary | ICD-10-CM | POA: Insufficient documentation

## 2016-06-27 DIAGNOSIS — M65862 Other synovitis and tenosynovitis, left lower leg: Secondary | ICD-10-CM | POA: Insufficient documentation

## 2016-07-06 ENCOUNTER — Ambulatory Visit (INDEPENDENT_AMBULATORY_CARE_PROVIDER_SITE_OTHER): Payer: Medicare Other | Admitting: Orthopaedic Surgery

## 2016-07-06 ENCOUNTER — Encounter (INDEPENDENT_AMBULATORY_CARE_PROVIDER_SITE_OTHER): Payer: Self-pay | Admitting: Orthopaedic Surgery

## 2016-07-06 VITALS — Resp 14 | Ht 63.0 in | Wt 180.0 lb

## 2016-07-06 DIAGNOSIS — G8929 Other chronic pain: Secondary | ICD-10-CM | POA: Diagnosis not present

## 2016-07-06 DIAGNOSIS — M25562 Pain in left knee: Secondary | ICD-10-CM | POA: Diagnosis not present

## 2016-07-06 NOTE — Progress Notes (Signed)
Mrs. Caitlin [name] is accompanied by her husband and here for follow-up evaluation of the left knee pain. An MRI scan was performed on November 27. Mrs. Caitlin [name] is had problem with her knee for well over a month and is not responded to an intra-articular steroid injection. She is experiencing pain predominantly along the lateral aspect of her knee associated with popping clicking and recurrent effusion.  On exam today there was a small effusion the left knee with predominantly lateral joint pain. There was minimal patellar crepitation. No evidence of instability. Neurovascular exam was intact.  MRI scan report demonstrates complex tear involving the anterior horn and mid body of the lateral meniscus. Medial meniscus was intact there were moderate degenerative changes about the patellofemoral joint and the lateral compartment. There were mild degenerative changes in the medial compartment. She also had a moderate-sized Bakers cyst.  We had a long discussion regarding the findings. I think the next step would be an arthroscopic evaluation and specifically a debridement of the lateral meniscus. The degenerative changes in the lateral compartment certainly will influence her result. She is fully aware that the arthroscopic debridement not to might only provide partial relief of her pain. Nonetheless she like to proceed so we'll set this up sometime in January after she returns from from a trip with her family.

## 2016-07-07 ENCOUNTER — Encounter (HOSPITAL_COMMUNITY): Payer: Medicare Other | Attending: Hematology & Oncology | Admitting: Hematology & Oncology

## 2016-07-07 ENCOUNTER — Encounter (HOSPITAL_COMMUNITY): Payer: Self-pay | Admitting: Hematology & Oncology

## 2016-07-07 VITALS — BP 113/48 | HR 78 | Temp 98.5°F | Resp 18 | Ht 63.0 in | Wt 193.0 lb

## 2016-07-07 DIAGNOSIS — C50211 Malignant neoplasm of upper-inner quadrant of right female breast: Secondary | ICD-10-CM | POA: Diagnosis not present

## 2016-07-07 DIAGNOSIS — Z170 Estrogen receptor positive status [ER+]: Secondary | ICD-10-CM | POA: Diagnosis not present

## 2016-07-07 DIAGNOSIS — Z79899 Other long term (current) drug therapy: Secondary | ICD-10-CM | POA: Insufficient documentation

## 2016-07-07 DIAGNOSIS — I10 Essential (primary) hypertension: Secondary | ICD-10-CM | POA: Insufficient documentation

## 2016-07-07 DIAGNOSIS — D7589 Other specified diseases of blood and blood-forming organs: Secondary | ICD-10-CM | POA: Diagnosis not present

## 2016-07-07 DIAGNOSIS — Z79811 Long term (current) use of aromatase inhibitors: Secondary | ICD-10-CM

## 2016-07-07 DIAGNOSIS — K219 Gastro-esophageal reflux disease without esophagitis: Secondary | ICD-10-CM | POA: Insufficient documentation

## 2016-07-07 DIAGNOSIS — E785 Hyperlipidemia, unspecified: Secondary | ICD-10-CM | POA: Insufficient documentation

## 2016-07-07 NOTE — Patient Instructions (Addendum)
[place] at [facility] Discharge Instructions  RECOMMENDATIONS MADE BY THE CONSULTANT AND ANY TEST RESULTS WILL BE SENT TO YOUR REFERRING PHYSICIAN.  You saw Dr.Penland today. Return for follow up in 3 months See Amy at checkout for appointments.   Thank you for choosing [place] at [facility] to provide your oncology and hematology care.  To afford each patient quality time with our provider, please arrive at least 15 minutes before your scheduled appointment time.   Beginning January 23rd 2017 lab work for the [facility] will be done in the  Main lab at [facility] on 1st floor. If you have a lab appointment with the [facility] please come in thru the  Main Entrance and check in at the main information desk  You need to re-schedule your appointment should you arrive 10 or more minutes late.  We strive to give you quality time with our providers, and arriving late affects you and other patients whose appointments are after yours.  Also, if you no show three or more times for appointments you may be dismissed from the clinic at the providers discretion.     Again, thank you for choosing [facility].  Our hope is that these requests will decrease the amount of time that you wait before being seen by our physicians.       _____________________________________________________________  Should you have questions after your visit to [facility], please contact our office at [phone] between the hours of 8:30 a.m. and 4:30 p.m.  Voicemails left after 4:30 p.m. will not be returned until the following business day.  For prescription refill requests, have your pharmacy contact our office.         Resources For Cancer Patients and their Caregivers ? American Cancer Society: Can assist with transportation, wigs, general needs, runs Look Good Feel Better.        (708) 265-7489 ? Cancer Care: Provides  financial assistance, online support groups, medication/co-pay assistance.  1-800-813-HOPE 516-177-7943) ? [place] Assists [place] cancer patients and their families through emotional , educational and financial support.  3158451027 ? [place] DSS Where to apply for food stamps, Medicaid and utility assistance. (805)825-7963 ? [facility]: Transportation to medical appointments. 401-249-4803 ? Social Security Administration: May apply for disability if have a Stage IV cancer. 214 471 2978 (670)013-7958 ? [employer], Disability and Transit Services: Assists with nutrition, care and transit needs. [place] Support Programs: @10RELATIVEDAYS @ > Cancer Support Group  2nd Tuesday of the month 1pm-2pm, Journey Room  > Creative Journey  3rd Tuesday of the month 1130am-1pm, Journey Room  > Look Good Feel Better  1st Wednesday of the month 10am-12 noon, Journey Room (Call [place] to register [phone])

## 2016-07-07 NOTE — Progress Notes (Signed)
[place]  Progress Note  Patient Care Team: Sharilyn Sites, MD as PCP - General  CHIEF COMPLAINTS/PURPOSE OF CONSULTATION:  pT1cN0M0 ER+ PR+ HER 2 neu - carcinoma of the R breast Lumpectomy/sentinel node biopsy with Dr. Barry Dienes on 11/11/2015    Breast cancer of upper-inner quadrant of right female breast ([place])   09/24/2014 Imaging    DEXA with normal bone density      10/06/2015 Imaging    Screening bilateral mammogram BI-RADS Category 0, R breast possible mass, L breast possible mass      10/20/2015 Imaging    Diagnostic B mammogram, R breast Ultrasound with suspicious mass in the R breast at the 1 o clock location 5 cm from the nipple, LN in the low R axillae with cortex nodularity      10/27/2015 Initial Biopsy    Ultrasound guided biopsy of R axillary LN, BENIGN. Ultrasound guided biopsy of R breast with invasive ductal carcinoma      11/04/2015 Initial Diagnosis    Breast cancer of upper-inner quadrant of right female breast ([place])      11/09/2015 Procedure    Radioactive seed localization R breast      11/11/2015 Surgery    R breast radioactive seed localized lumpectomy and seed targeted sentinel LN biopsy      11/11/2015 Pathology Results    Invasive ductal carcinoma Grade I/III spanning 1.1 cm, lobular neoplasia (LCIS) resection margins negative. 3 negative sentinel nodes ER (100%), PR (100%), Her2 neu negative pT1c, pN0      12/11/2015 Oncotype testing    Recurrence Score result 11, 10 year risk of distant recurrence with tamoxifen alone 8% (low-risk)      01/11/2016 - 02/04/2016 Radiation Therapy    Adjuvant breast radiation ([place]): 42.72 Gy in 16 fractions to the right breast      01/2016 -  Anti-estrogen oral therapy    Anastrazole 1 mg daily. Planned duration of therapy: 5 years.         HISTORY OF PRESENTING ILLNESS:  Caitlin [name] 70 y.o. female is here because of Stage I infiltrating ductal carcinoma with associated LCIS,   ER+ PR+ and HER2-.   Patient has been tolerating Arimidex well. Denies joint pain or abdominal pain. Denies hot flashes. She notes compliance with her medication.  She has a complex meniscal tear and is scheduled to have surgery next Thursday.  Mammogram due in March 2018. She denies any problems with her breasts.   MEDICAL HISTORY:  Past Medical History:  Diagnosis Date  . Asthma   . Breast cancer ([place])   . Breast disorder    cancer  . Breast mass    benign  . GERD (gastroesophageal reflux disease)   . History of hiatal hernia   . Hyperlipidemia   . Hypertension   . Impaired fasting glucose   . Rectocele     SURGICAL HISTORY: Past Surgical History:  Procedure Laterality Date  . BREAST LUMPECTOMY WITH RADIOACTIVE SEED AND SENTINEL LYMPH NODE BIOPSY Right 11/11/2015   Procedure: RIGHT BREAST RADIOACTIVE SEED GUIDED  LUMPECTOMY AND RADIOACTIVE SEED TARGETED SENTINEL LYMPH NODE BIOPSY;  Surgeon: Stark Klein, MD;  Location: [facility];  Service: General;  Laterality: Right;  . BREAST SURGERY Left    breast biopies x4  . CHOLECYSTECTOMY  09/08/2010  . COLONOSCOPY    . COLONOSCOPY N/A 09/25/2013   Procedure: COLONOSCOPY;  Surgeon: Rogene Houston, MD;  [facility];  Service: Endoscopy;  Laterality: N/A;  100-moved to 1200 Ann to notify pt  . HERNIA REPAIR  12/24/2010, 12-2014  . LAPAROSCOPIC GASTRIC SLEEVE RESECTION  11-2013   at [place]  2007/2008  . TONSILLECTOMY  1968  . UPPER GASTROINTESTINAL ENDOSCOPY    . VAGINAL HYSTERECTOMY  1980s    SOCIAL HISTORY: Social History   Social History  . Marital status: Married    Spouse name: N/A  . Number of children: 2  . Years of education: N/A   Occupational History  . retired Retired    Pharmacist, hospital   Social History Main Topics  . Smoking status: Never Smoker  . Smokeless tobacco: Never Used  . Alcohol use Yes     Comment: occasional wine  . Drug use: No  .  Sexual activity: Yes    Birth control/ protection: Surgical     Comment: hyst   Other Topics Concern  . Not on file   Social History Narrative  . No narrative on file  Married for 44 years. 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  From [place], [place] Father was 101 when he passed had rectal/colon cancer when he was 68 years old. Died from congestive heart failure. Mother was 49 when she passed; had dementia 3 sisters-Older sister had health problems Maternal Grandmother had breast cancer. Natural Menopause during late 97's  FAMILY HISTORY: Family History  Problem Relation Age of Onset  . Colon cancer Father   . Rectal cancer Father   . Prostate cancer Father   . Dementia Mother   . Osteoporosis Mother   . Healthy Daughter   . Healthy Son   . Pancreatic cancer Paternal Grandfather   . Heart disease Paternal Grandmother   . Heart disease Maternal Grandfather   . Breast cancer Maternal Grandmother   . Scoliosis Sister     ALLERGIES:  is allergic to tetanus toxoids and tetanus toxoid adsorbed.  MEDICATIONS:  Current Outpatient Prescriptions  Medication Sig Dispense Refill  . anastrozole (ARIMIDEX) 1 MG tablet Take 1 tablet (1 mg total) by mouth daily. 30 tablet 3  . B Complex Vitamins (B-COMPLEX/B-12 SL) Place 1,000 mcg/day under the tongue daily.    . Calcium Citrate-Vitamin D (CALCIUM CITRATE CHEWY BITE) 500-500 MG-UNIT CHEW Chew 500 mg by mouth. One soft chews twice daily    . Calcium-Vitamin D-Iron (RA CALCIUM PLUS IRON/VIT D PO) Take 1 tablet by mouth at bedtime.    . docusate sodium (COLACE) 100 MG capsule Take 100 mg by mouth daily. Daily at bedtime    . esomeprazole (NEXIUM) 20 MG capsule Take 20 mg by mouth daily at 12 noon.    . famotidine (PEPCID) 20 MG tablet Take 20 mg by mouth daily.     .  fluticasone-salmeterol (ADVAIR HFA) 115-21 MCG/ACT inhaler Inhale 2 puffs into the lungs as needed.     . magnesium oxide (MAG-OX) 400 MG tablet Take 400 mg by mouth daily.    . Melatonin 3 MG TABS Take 3 mg by mouth as needed.     . Menaquinone-7 (VITAMIN K2) 100 MCG CAPS Take 100 mcg/day by mouth daily after supper.    [name] MILK THISTLE EXTRACT PO Take 1 tablet by mouth daily. 1083m    . Multiple Vitamin (MULTIVITAMIN) capsule Take 2 capsules by mouth daily.     .[name]  pravastatin (PRAVACHOL) 20 MG tablet Take 20 mg by mouth daily.    [name] telmisartan-hydrochlorothiazide (MICARDIS HCT) 80-25 MG per tablet Take 0.5 tablets by mouth daily.      No current facility-administered medications for this visit.     Review of Systems  Constitutional: Negative.  Negative for chills, fever, malaise/fatigue and weight loss.  HENT: Negative.  Negative for congestion, hearing loss, nosebleeds, sore throat and tinnitus.   Eyes: Negative.  Negative for blurred vision, double vision, pain and discharge.  Respiratory: Negative.  Negative for cough, hemoptysis, sputum production, shortness of breath and wheezing.   Cardiovascular: Negative.  Negative for chest pain, palpitations, claudication, leg swelling and PND.  Gastrointestinal: Negative.  Negative for abdominal pain, blood in stool, constipation, diarrhea, heartburn, melena, nausea and vomiting.  Genitourinary: Negative.  Negative for dysuria, frequency, hematuria and urgency.  Musculoskeletal: Negative.  Negative for falls, joint pain and myalgias.  Skin: Negative.  Negative for itching and rash.  Neurological: Negative.  Negative for dizziness, tingling, tremors, sensory change, speech change, focal weakness, seizures, loss of consciousness, weakness and headaches.  Endo/Heme/Allergies: Negative.  Does not bruise/bleed easily.  Psychiatric/Behavioral: Negative.  Negative for depression, memory loss, substance abuse and suicidal ideas. The patient is not  nervous/anxious and does not have insomnia.   All other systems reviewed and are negative. 14 point ROS was done and is otherwise as detailed above or in HPI  PHYSICAL EXAMINATION: ECOG PERFORMANCE STATUS: 0 - Asymptomatic  Vitals:   07/07/16 1154  BP: (!) 113/48  Pulse: 78  Resp: 18  Temp: 98.5 F (36.9 C)   Filed Weights   07/07/16 1154  Weight: 193 lb (87.5 kg)    Physical Exam  Constitutional: She is oriented to person, place, and time and well-developed, well-nourished, and in no distress.  Wears glasses  HENT:  Head: Normocephalic and atraumatic.  Nose: Nose normal.  Mouth/Throat: Oropharynx is clear and moist. No oropharyngeal exudate.  Eyes: Conjunctivae and EOM are normal. Pupils are equal, round, and reactive to light. Right eye exhibits no discharge. Left eye exhibits no discharge. No scleral icterus.  Neck: Normal range of motion. Neck supple. No tracheal deviation present. No thyromegaly present.  Cardiovascular: Normal rate, regular rhythm and normal heart sounds.  Exam reveals no gallop and no friction rub.   No murmur heard. Pulmonary/Chest: Effort normal and breath sounds normal. She has no wheezes. She has no rales.  Abdominal: Soft. Bowel sounds are normal. She exhibits no distension and no mass. There is no tenderness. There is no rebound and no guarding.  Musculoskeletal: Normal range of motion. She exhibits no edema.  Lymphadenopathy:    She has no cervical adenopathy.  Neurological: She is alert and oriented to person, place, and time. She has normal reflexes. No cranial nerve deficit. Gait normal. Coordination normal.  Skin: Skin is warm and dry. No rash noted.  Psychiatric: Mood, memory, affect and judgment normal.  Nursing note and vitals reviewed.    LABORATORY DATA:  I have reviewed the data as listed Lab Results  Component Value Date   WBC 5.1 04/07/2016   HGB 12.6 04/07/2016   HCT 37.2 04/07/2016   MCV 103.3 (H) 04/07/2016   PLT 183  04/07/2016   CMP     Component Value Date/Time   NA 138 04/07/2016 1009   K 4.1 04/07/2016 1009   CL 102 04/07/2016 1009   CO2 28 04/07/2016 1009   GLUCOSE 98 04/07/2016 1009   BUN 9  04/07/2016 1009   CREATININE 0.57 04/07/2016 1009   CALCIUM 9.5 04/07/2016 1009   PROT 6.9 04/07/2016 1009   ALBUMIN 3.9 04/07/2016 1009   AST 68 (H) 04/07/2016 1009   ALT 40 04/07/2016 1009   ALKPHOS 107 04/07/2016 1009   BILITOT 0.5 04/07/2016 1009   GFRNONAA >60 04/07/2016 1009   GFRAA >60 04/07/2016 1009   RADIOGRAPHIC STUDIES: I have personally reviewed the radiological images as listed and agreed with the findings in the report.  No results found.  Study Result   CLINICAL DATA:  Biopsy proven invasive ductal carcinoma of the right breast. Axillary lymph node biopsy showed benign adipose tissue with no lymphoid tissue identified. Surgical excision of the lymph node was recommended.  EXAM: SPECIMEN RADIOGRAPH OF THE RIGHT AXILLA.  COMPARISON:  Previous exam(s).  FINDINGS: Status post excision of the right axilla. The radioactive seed and biopsy marker clip are present, completely intact, and were marked for pathology.  IMPRESSION: Specimen radiograph of the right axilla.   Electronically Signed   By: Lillia Mountain M.D.   On: 11/11/2015 12:46     PATHOLOGY:      ASSESSMENT & PLAN:  pT1cN0M0 ER+ PR+ HER 2 neu - carcinoma of the R breast, upper inner quadrant ER+ 100% PR + 100% HER 2 - Lumpectomy/sentinel node biopsy with Dr. Barry Dienes on 11/11/2015 Bone Density 09/2014 NORMAL Adjuvant XRT ONCOTYPE low risk Macrocytosis   She is to continue on arimidex. She seems to be tolerating therapy well. Planned treatment duration is 5 years.  She is up to date with DEXA and had normal bone density. She was instructed on weight bearing exercise and calcium and vitamin D use.  She has been seen by survivorship.   Macrocytosis is noted on her CBC.  B12 is elevated and folate is  WNL.  She will be due for mammography in March 2018.   RTC in 3 months.   All questions were answered. The patient knows to call the clinic with any problems, questions or concerns.  This document serves as a record of services personally performed by Ancil Linsey, MD. It was created on her behalf by Elmyra Ricks, a trained medical scribe. The creation of this record is based on the scribe's personal observations and the provider's statements to them. This document has been checked and approved by the attending provider.  I have reviewed the above documentation for accuracy and completeness, and I agree with the above.  This note was electronically signed.    Kelby Fam. Penland, MD  07/07/2016 12:01 PM

## 2016-07-14 DIAGNOSIS — S83242A Other tear of medial meniscus, current injury, left knee, initial encounter: Secondary | ICD-10-CM | POA: Diagnosis not present

## 2016-07-14 DIAGNOSIS — M1712 Unilateral primary osteoarthritis, left knee: Secondary | ICD-10-CM | POA: Diagnosis not present

## 2016-07-14 DIAGNOSIS — M23242 Derangement of anterior horn of lateral meniscus due to old tear or injury, left knee: Secondary | ICD-10-CM | POA: Diagnosis not present

## 2016-07-14 DIAGNOSIS — M659 Synovitis and tenosynovitis, unspecified: Secondary | ICD-10-CM | POA: Diagnosis not present

## 2016-07-14 DIAGNOSIS — M23222 Derangement of posterior horn of medial meniscus due to old tear or injury, left knee: Secondary | ICD-10-CM | POA: Diagnosis not present

## 2016-07-14 DIAGNOSIS — M23342 Other meniscus derangements, anterior horn of lateral meniscus, left knee: Secondary | ICD-10-CM | POA: Diagnosis not present

## 2016-07-14 DIAGNOSIS — G8918 Other acute postprocedural pain: Secondary | ICD-10-CM | POA: Diagnosis not present

## 2016-07-14 DIAGNOSIS — M23332 Other meniscus derangements, other medial meniscus, left knee: Secondary | ICD-10-CM | POA: Diagnosis not present

## 2016-07-14 DIAGNOSIS — M94262 Chondromalacia, left knee: Secondary | ICD-10-CM | POA: Diagnosis not present

## 2016-07-20 ENCOUNTER — Encounter (INDEPENDENT_AMBULATORY_CARE_PROVIDER_SITE_OTHER): Payer: Self-pay | Admitting: Orthopaedic Surgery

## 2016-07-20 ENCOUNTER — Ambulatory Visit (INDEPENDENT_AMBULATORY_CARE_PROVIDER_SITE_OTHER): Payer: Medicare Other | Admitting: Orthopaedic Surgery

## 2016-07-20 VITALS — BP 112/62 | HR 99 | Resp 14 | Ht 63.0 in | Wt 185.0 lb

## 2016-07-20 DIAGNOSIS — G8929 Other chronic pain: Secondary | ICD-10-CM

## 2016-07-20 DIAGNOSIS — M25562 Pain in left knee: Secondary | ICD-10-CM

## 2016-07-20 NOTE — Progress Notes (Signed)
Office Visit Note   Patient: Caitlin [name]           Date of Birth: 02/21/1946           MRN: [id number] Visit Date: 07/20/2016              Requested by: Sharilyn Sites, MD [address] [place], [place] [address] PCP: [name], MD   Assessment & Plan: Visit Diagnoses: 1 week status post left knee arthroscopy with partial medial meniscectomy and chondroplasty. As his Music is doing quite well without complication  Plan: Follow-up in 2 weeks. Instructed on exercises   Follow-Up Instructions: No Follow-up on file.   Orders:  No orders of the defined types were placed in this encounter.  No orders of the defined types were placed in this encounter.     Procedures: No procedures performed   Clinical Data: No additional findings.   Subjective: No chief complaint on file.   Left knee scope 07/14/16. Pt is driving, going up/down stairs No meds. Only Aleve    Review of Systems   Objective: Vital Signs: There were no vitals taken for this visit.  Physical Exam  Ortho Exam left knee exam with well-healed arthroscopic portals. Minimal effusion. Full extension. Flexion over 100 without instability. Minimal medial and lateral joint pain. No calf pain or popliteal fullness. No swelling distally. Neurovascular exam is intact.  No specialty comments available.  Imaging: No results found.   PMFS History: Patient Active Problem List   Diagnosis Date Noted  . Chondromalacia patellae, left knee 05/26/2016  . Breast cancer of upper-inner quadrant of right female breast ([place]) 11/04/2015  . GERD (gastroesophageal reflux disease) 09/02/2013  . Obesity 09/02/2013  . Ventral hernia, recurrent 09/02/2013  . Unspecified asthma(493.90) 12/12/2012  . Cough 03/27/2012  . Right middle lobe syndrome 03/27/2012   Past Medical History:  Diagnosis Date  . Asthma   . Breast cancer ([place])   . Breast disorder    cancer  . Breast mass    benign  . GERD  (gastroesophageal reflux disease)   . History of hiatal hernia   . Hyperlipidemia   . Hypertension   . Impaired fasting glucose   . Rectocele     Family History  Problem Relation Age of Onset  . Colon cancer Father   . Rectal cancer Father   . Prostate cancer Father   . Dementia Mother   . Osteoporosis Mother   . Healthy Daughter   . Healthy Son   . Pancreatic cancer Paternal Grandfather   . Heart disease Paternal Grandmother   . Heart disease Maternal Grandfather   . Breast cancer Maternal Grandmother   . Scoliosis Sister     Past Surgical History:  Procedure Laterality Date  . BREAST LUMPECTOMY WITH RADIOACTIVE SEED AND SENTINEL LYMPH NODE BIOPSY Right 11/11/2015   Procedure: RIGHT BREAST RADIOACTIVE SEED GUIDED  LUMPECTOMY AND RADIOACTIVE SEED TARGETED SENTINEL LYMPH NODE BIOPSY;  Surgeon: Stark Klein, MD;  Location: [facility];  Service: General;  Laterality: Right;  . BREAST SURGERY Left    breast biopies x4  . CHOLECYSTECTOMY  09/08/2010  . COLONOSCOPY    . COLONOSCOPY N/A 09/25/2013   Procedure: COLONOSCOPY;  Surgeon: Rogene Houston, MD;  [facility];  Service: Endoscopy;  Laterality: N/A;  100-moved to 1200 Ann to notify pt  . HERNIA REPAIR  12/24/2010, 12-2014  . [place] RESECTION  11-2013   at [facility]  .  LASER ABLATION OF VASCULAR LESION  2007/2008  . TONSILLECTOMY  1968  . UPPER GASTROINTESTINAL ENDOSCOPY    . VAGINAL HYSTERECTOMY  1980s   Social History   Occupational History  . retired Retired    Pharmacist, hospital   Social History Main Topics  . Smoking status: Never Smoker  . Smokeless tobacco: Never Used  . Alcohol use Yes     Comment: occasional wine  . Drug use: No  . Sexual activity: Yes    Birth control/ protection: Surgical     Comment: hyst

## 2016-07-28 ENCOUNTER — Encounter (INDEPENDENT_AMBULATORY_CARE_PROVIDER_SITE_OTHER): Payer: Self-pay | Admitting: Orthopaedic Surgery

## 2016-07-28 ENCOUNTER — Ambulatory Visit (INDEPENDENT_AMBULATORY_CARE_PROVIDER_SITE_OTHER): Payer: Medicare Other | Admitting: Orthopaedic Surgery

## 2016-07-28 VITALS — Ht 63.0 in | Wt 185.0 lb

## 2016-07-28 DIAGNOSIS — M2242 Chondromalacia patellae, left knee: Secondary | ICD-10-CM

## 2016-07-28 NOTE — Progress Notes (Signed)
Post-Op Visit Note   Patient: Caitlin [name]           Date of Birth: 1946-06-27           MRN: [id number] Visit Date: 07/28/2016 PCP: [name], MD   Assessment & Plan: Postop knee arthroscopy with chondroplasty partial meniscectomy with some the bilateral lower extremity edema worse on the left than right.  Chief Complaint:  Chief Complaint  Patient presents with  . Left Knee - Pain   Visit Diagnoses: No diagnosis found.  Plan: Chest x-ray showed borderline enlargement done several months ago. Will order some tense. She's not on any medications that should make her ankles swell. She's been on her feet more Christmas time. She can elevate her legs. She has a trip to [place] for several weeks coming up with the grandchildren in a couple weeks. Arthroscopic portals are well healed no synovitis.  Follow-Up Instructions: No Follow-up on file.   Orders:  No orders of the defined types were placed in this encounter.  No orders of the defined types were placed in this encounter.  HPI Patient is status post left knee scope on 07/14/2016 by Dr. Durward Fortes. She is still having pain and is having some swelling in her knee and ankle. She states that her calf is okay. She just wants to be sure that everything is progressing ok.   PMFS History: Patient Active Problem List   Diagnosis Date Noted  . Chondromalacia patellae, left knee 05/26/2016  . Breast cancer of upper-inner quadrant of right female breast ([place]) 11/04/2015  . GERD (gastroesophageal reflux disease) 09/02/2013  . Obesity 09/02/2013  . Ventral hernia, recurrent 09/02/2013  . Unspecified asthma(493.90) 12/12/2012  . Cough 03/27/2012  . Right middle lobe syndrome 03/27/2012   Past Medical History:  Diagnosis Date  . Asthma   . Breast cancer ([place])   . Breast disorder    cancer  . Breast mass    benign  . GERD (gastroesophageal reflux disease)   . History of hiatal hernia   . Hyperlipidemia   .  Hypertension   . Impaired fasting glucose   . Rectocele     Family History  Problem Relation Age of Onset  . Colon cancer Father   . Rectal cancer Father   . Prostate cancer Father   . Dementia Mother   . Osteoporosis Mother   . Healthy Daughter   . Healthy Son   . Pancreatic cancer Paternal Grandfather   . Heart disease Paternal Grandmother   . Heart disease Maternal Grandfather   . Breast cancer Maternal Grandmother   . Scoliosis Sister     Past Surgical History:  Procedure Laterality Date  . BREAST LUMPECTOMY WITH RADIOACTIVE SEED AND SENTINEL LYMPH NODE BIOPSY Right 11/11/2015   Procedure: RIGHT BREAST RADIOACTIVE SEED GUIDED  LUMPECTOMY AND RADIOACTIVE SEED TARGETED SENTINEL LYMPH NODE BIOPSY;  Surgeon: Stark Klein, MD;  Location: [facility];  Service: General;  Laterality: Right;  . BREAST SURGERY Left    breast biopies x4  . CHOLECYSTECTOMY  09/08/2010  . COLONOSCOPY    . COLONOSCOPY N/A 09/25/2013   Procedure: COLONOSCOPY;  Surgeon: Rogene Houston, MD;  [facility];  Service: Endoscopy;  Laterality: N/A;  100-moved to 1200 Ann to notify pt  . HERNIA REPAIR  12/24/2010, 12-2014  . LAPAROSCOPIC GASTRIC SLEEVE RESECTION  11-2013   at [place]  2007/2008  . TONSILLECTOMY  1968  .  UPPER GASTROINTESTINAL ENDOSCOPY    . VAGINAL HYSTERECTOMY  1980s   Social History   Occupational History  . retired Retired    Pharmacist, hospital   Social History Main Topics  . Smoking status: Never Smoker  . Smokeless tobacco: Never Used  . Alcohol use Yes     Comment: occasional wine  . Drug use: No  . Sexual activity: Yes    Birth control/ protection: Surgical     Comment: hyst

## 2016-08-03 ENCOUNTER — Ambulatory Visit (INDEPENDENT_AMBULATORY_CARE_PROVIDER_SITE_OTHER): Payer: Medicare Other | Admitting: Orthopaedic Surgery

## 2016-08-03 ENCOUNTER — Encounter (INDEPENDENT_AMBULATORY_CARE_PROVIDER_SITE_OTHER): Payer: Self-pay | Admitting: Orthopaedic Surgery

## 2016-08-03 VITALS — Ht 63.0 in | Wt 185.0 lb

## 2016-08-03 DIAGNOSIS — G8929 Other chronic pain: Secondary | ICD-10-CM

## 2016-08-03 DIAGNOSIS — M25562 Pain in left knee: Secondary | ICD-10-CM

## 2016-08-03 NOTE — Progress Notes (Signed)
Office Visit Note   Patient: Caitlin [name]           Date of Birth: Oct 04, 1945           MRN: [id number] Visit Date: 08/03/2016              Requested by: Sharilyn Sites, MD [address] [place], [place] [address] PCP: [name], MD   Assessment & Plan: Visit Diagnoses: 6 weeks status post left knee arthroscopy with meniscal tear and tricompartmental osteoarthritis. Some residual effusion and lower extremity swelling. No evidence of DVT  Plan: Follow-up in one week for consideration of cortisone injection in anticipation of trip to [place]. Home exercises. We will increase Aleve dose to 2 tabs twice a day.  Follow-Up Instructions: No Follow-up on file.   Orders:  No orders of the defined types were placed in this encounter.  No orders of the defined types were placed in this encounter.     Procedures: No procedures performed   Clinical Data: No additional findings.   Subjective: Chief Complaint  Patient presents with  . Left Knee - Pain, Edema  Post op 07/14/16 Left knee meniscus repair. Pt complaining of swelling,  And tenderness, going up and down stairs over the holidays.  Pt only taking 2 Aleve , said you told her to take 2 am, 2 pm.   Review of Systems   Objective: Vital Signs: Ht 5\' 3"  (1.6 m)   Wt 185 lb (83.9 kg)   BMI 32.77 kg/m   Physical Exam  Ortho Exam left knee with small effusion. Full passive extension and approximately 100 of flexion. Mild swelling of left ankle and foot compared to right but without calf or popliteal pain. Negative Homans. No localized knee pain  Specialty Comments:  No specialty comments available.  Imaging: No results found.   PMFS History: Patient Active Problem List   Diagnosis Date Noted  . Chondromalacia patellae, left knee 05/26/2016  . Breast cancer of upper-inner quadrant of right female breast ([place]) 11/04/2015  . GERD (gastroesophageal reflux disease) 09/02/2013  . Obesity 09/02/2013  .  Ventral hernia, recurrent 09/02/2013  . Unspecified asthma(493.90) 12/12/2012  . Cough 03/27/2012  . Right middle lobe syndrome 03/27/2012   Past Medical History:  Diagnosis Date  . Asthma   . Breast cancer ([facility])   . Breast disorder    cancer  . Breast mass    benign  . GERD (gastroesophageal reflux disease)   . History of hiatal hernia   . Hyperlipidemia   . Hypertension   . Impaired fasting glucose   . Rectocele     Family History  Problem Relation Age of Onset  . Colon cancer Father   . Rectal cancer Father   . Prostate cancer Father   . Dementia Mother   . Osteoporosis Mother   . Healthy Daughter   . Healthy Son   . Pancreatic cancer Paternal Grandfather   . Heart disease Paternal Grandmother   . Heart disease Maternal Grandfather   . Breast cancer Maternal Grandmother   . Scoliosis Sister     Past Surgical History:  Procedure Laterality Date  . BREAST LUMPECTOMY WITH RADIOACTIVE SEED AND SENTINEL LYMPH NODE BIOPSY Right 11/11/2015   Procedure: RIGHT BREAST RADIOACTIVE SEED GUIDED  LUMPECTOMY AND RADIOACTIVE SEED TARGETED SENTINEL LYMPH NODE BIOPSY;  Surgeon: Stark Klein, MD;  Location: [facility];  Service: General;  Laterality: Right;  . BREAST SURGERY Left    breast biopies  x4  . CHOLECYSTECTOMY  09/08/2010  . COLONOSCOPY    . COLONOSCOPY N/A 09/25/2013   Procedure: COLONOSCOPY;  Surgeon: Rogene Houston, MD;  [facility];  Service: Endoscopy;  Laterality: N/A;  100-moved to 1200 Ann to notify pt  . HERNIA REPAIR  12/24/2010, 12-2014  . LAPAROSCOPIC GASTRIC SLEEVE RESECTION  11-2013   at [place]  2007/2008  . TONSILLECTOMY  1968  . UPPER GASTROINTESTINAL ENDOSCOPY    . VAGINAL HYSTERECTOMY  1980s   Social History   Occupational History  . retired Retired    Pharmacist, hospital   Social History Main Topics  . Smoking status: Never Smoker  . Smokeless tobacco: Never Used  . Alcohol use Yes     Comment:  occasional wine  . Drug use: No  . Sexual activity: Yes    Birth control/ protection: Surgical     Comment: hyst

## 2016-08-10 ENCOUNTER — Ambulatory Visit (INDEPENDENT_AMBULATORY_CARE_PROVIDER_SITE_OTHER): Payer: Medicare Other | Admitting: Orthopaedic Surgery

## 2016-08-10 DIAGNOSIS — M25562 Pain in left knee: Secondary | ICD-10-CM

## 2016-08-10 DIAGNOSIS — G8929 Other chronic pain: Secondary | ICD-10-CM

## 2016-08-10 MED ORDER — BUPIVACAINE HCL 0.5 % IJ SOLN
3.0000 mL | INTRAMUSCULAR | Status: AC | PRN
  Administered 2016-08-10: 3 mL via INTRA_ARTICULAR

## 2016-08-10 MED ORDER — METHYLPREDNISOLONE ACETATE 40 MG/ML IJ SUSP
80.0000 mg | INTRAMUSCULAR | Status: AC | PRN
  Administered 2016-08-10: 80 mg

## 2016-08-10 MED ORDER — LIDOCAINE HCL 1 % IJ SOLN
5.0000 mL | INTRAMUSCULAR | Status: AC | PRN
  Administered 2016-08-10: 5 mL

## 2016-08-10 NOTE — Progress Notes (Signed)
Office Visit Note   Patient: Caitlin [name]           Date of Birth: 1945-09-27           MRN: [id number] Visit Date: 08/10/2016              Requested by: Sharilyn Sites, MD [address] [place], [place] [address] PCP: [name], MD   Assessment & Plan: Visit Diagnoses:  1. Chronic pain of left knee   Status post knee arthroscopy some residual stiffness and soreness  Plan: Mr. and Mrs. Havrilla are planning a trip to [place] with their grandchildren. I asked her to come in today to get a cortisone injection in her left knee  Follow-Up Instructions: Return as scheduled.   Orders:  No orders of the defined types were placed in this encounter.  No orders of the defined types were placed in this encounter.     Procedures: Large Joint Inj Date/Time: 08/10/2016 11:31 AM Performed by: [name] Authorized by: [name]   Consent Given by:  Patient Timeout: prior to procedure the correct patient, procedure, and site was verified   Indications:  Pain and joint swelling Location:  Knee Site:  L knee Prep: patient was prepped and draped in usual sterile fashion   Needle Size:  25 G Needle Length:  1.5 inches Approach:  Anteromedial Ultrasound Guidance: No   Fluoroscopic Guidance: No   Arthrogram: No   Medications:  5 mL lidocaine 1 %; 80 mg methylPREDNISolone acetate 40 MG/ML; 3 mL bupivacaine 0.5 % Aspiration Attempted: No   Patient tolerance:  Patient tolerated the procedure well with no immediate complications     Clinical Data: No additional findings.   Subjective: No chief complaint on file.   HPI  Review of Systems   Objective: Vital Signs: There were no vitals taken for this visit.  Physical Exam  Ortho Exam minimal effusion left knee. Arthroscopic portals are nicely healed. Full extension. No instability. No swelling distally. Neurovascular exam intact.  Specialty Comments:  No specialty comments  available.  Imaging: No results found.   PMFS History: Patient Active Problem List   Diagnosis Date Noted  . Chondromalacia patellae, left knee 05/26/2016  . Breast cancer of upper-inner quadrant of right female breast ([facility]) 11/04/2015  . GERD (gastroesophageal reflux disease) 09/02/2013  . Obesity 09/02/2013  . Ventral hernia, recurrent 09/02/2013  . Unspecified asthma(493.90) 12/12/2012  . Cough 03/27/2012  . Right middle lobe syndrome 03/27/2012   Past Medical History:  Diagnosis Date  . Asthma   . Breast cancer ([facility])   . Breast disorder    cancer  . Breast mass    benign  . GERD (gastroesophageal reflux disease)   . History of hiatal hernia   . Hyperlipidemia   . Hypertension   . Impaired fasting glucose   . Rectocele     Family History  Problem Relation Age of Onset  . Colon cancer Father   . Rectal cancer Father   . Prostate cancer Father   . Dementia Mother   . Osteoporosis Mother   . Healthy Daughter   . Healthy Son   . Pancreatic cancer Paternal Grandfather   . Heart disease Paternal Grandmother   . Heart disease Maternal Grandfather   . Breast cancer Maternal Grandmother   . Scoliosis Sister     Past Surgical History:  Procedure Laterality Date  . BREAST LUMPECTOMY WITH RADIOACTIVE SEED AND SENTINEL LYMPH NODE BIOPSY Right  11/11/2015   Procedure: RIGHT BREAST RADIOACTIVE SEED GUIDED  LUMPECTOMY AND RADIOACTIVE SEED TARGETED SENTINEL LYMPH NODE BIOPSY;  Surgeon: Stark Klein, MD;  Location: [facility];  Service: General;  Laterality: Right;  . BREAST SURGERY Left    breast biopies x4  . CHOLECYSTECTOMY  09/08/2010  . COLONOSCOPY    . COLONOSCOPY N/A 09/25/2013   Procedure: COLONOSCOPY;  Surgeon: Rogene Houston, MD;  [facility];  Service: Endoscopy;  Laterality: N/A;  100-moved to 1200 Ann to notify pt  . HERNIA REPAIR  12/24/2010, 12-2014  . LAPAROSCOPIC GASTRIC SLEEVE RESECTION  11-2013   at [place]  2007/2008  . TONSILLECTOMY  1968  . UPPER GASTROINTESTINAL ENDOSCOPY    . VAGINAL HYSTERECTOMY  1980s   Social History   Occupational History  . retired Retired    Pharmacist, hospital   Social History Main Topics  . Smoking status: Never Smoker  . Smokeless tobacco: Never Used  . Alcohol use Yes     Comment: occasional wine  . Drug use: No  . Sexual activity: Yes    Birth control/ protection: Surgical     Comment: hyst

## 2016-08-29 ENCOUNTER — Encounter (HOSPITAL_COMMUNITY): Payer: Self-pay | Admitting: Hematology & Oncology

## 2016-09-13 ENCOUNTER — Encounter (INDEPENDENT_AMBULATORY_CARE_PROVIDER_SITE_OTHER): Payer: Medicare Other | Admitting: Ophthalmology

## 2016-09-15 ENCOUNTER — Ambulatory Visit: Payer: Self-pay | Admitting: Radiation Oncology

## 2016-09-21 ENCOUNTER — Encounter (INDEPENDENT_AMBULATORY_CARE_PROVIDER_SITE_OTHER): Payer: Medicare Other | Admitting: Ophthalmology

## 2016-09-21 DIAGNOSIS — H43813 Vitreous degeneration, bilateral: Secondary | ICD-10-CM | POA: Diagnosis not present

## 2016-09-21 DIAGNOSIS — H3563 Retinal hemorrhage, bilateral: Secondary | ICD-10-CM

## 2016-09-21 DIAGNOSIS — H35033 Hypertensive retinopathy, bilateral: Secondary | ICD-10-CM

## 2016-09-21 DIAGNOSIS — I10 Essential (primary) hypertension: Secondary | ICD-10-CM | POA: Diagnosis not present

## 2016-09-22 ENCOUNTER — Encounter: Payer: Self-pay | Admitting: Radiation Oncology

## 2016-09-22 ENCOUNTER — Ambulatory Visit
Admission: RE | Admit: 2016-09-22 | Discharge: 2016-09-22 | Disposition: A | Discharge status: Home / Self Care | Payer: Medicare Other | Source: Ambulatory Visit | Attending: Radiation Oncology | Admitting: Radiation Oncology

## 2016-09-22 DIAGNOSIS — Y842 Radiological procedure and radiotherapy as the cause of abnormal reaction of the patient, or of later complication, without mention of misadventure at the time of the procedure: Secondary | ICD-10-CM | POA: Diagnosis not present

## 2016-09-22 DIAGNOSIS — Z08 Encounter for follow-up examination after completed treatment for malignant neoplasm: Secondary | ICD-10-CM | POA: Diagnosis not present

## 2016-09-22 DIAGNOSIS — C50211 Malignant neoplasm of upper-inner quadrant of right female breast: Secondary | ICD-10-CM | POA: Diagnosis not present

## 2016-09-22 DIAGNOSIS — L539 Erythematous condition, unspecified: Secondary | ICD-10-CM | POA: Diagnosis not present

## 2016-09-22 DIAGNOSIS — Z79811 Long term (current) use of aromatase inhibitors: Secondary | ICD-10-CM | POA: Diagnosis not present

## 2016-09-22 NOTE — Progress Notes (Signed)
She rates her pain as a 4 on a scale of 0-10. constant and aching over left knee.  Pt right breast- positive for erythema-reports skin is "thick."  Pt denies edema. Pt continues to apply lotion with Vitamin E-Eucerin as directed. BP (!) 114/97   Pulse (!) 109   Temp 98.6 F (37 C) (Oral)   Resp 12   Wt 198 lb 12.8 oz (90.2 kg)   SpO2 100%   BMI 35.22 kg/m  Wt Readings from Last 3 Encounters:  09/22/16 198 lb 12.8 oz (90.2 kg)  08/03/16 185 lb (83.9 kg)  07/28/16 185 lb (83.9 kg)   Pt reports: Yes No Comments  Nolvadex (tamoxifen) []  []    Arimidex (anastrozole) [x]  []  Started Sept 2017  Last Med Onc Appt: Date:Dec 2017 Next: Doctor: Whitney Muse, not rescheduled with new DR  Last Mammogram: Date: Next:March 2018   Survivorship Appt: Date: [] [facility] pamphlet []  [employer]

## 2016-09-22 NOTE — Progress Notes (Signed)
Radiation Oncology         [phone] ________________________________  Name: Caitlin [name] MRN: [id number]  Date: 09/22/2016  DOB: 25-Apr-1946    Follow-Up Visit Note  CC: [name], MD  Stark Klein, MD  Diagnosis:   71 y.o. woman with [id number] of the right breast    ICD-9-CM ICD-10-CM   1. Malignant neoplasm of upper-inner quadrant of right female breast, unspecified estrogen receptor status ([facility]) 174.2 C50.211     Interval Since Last Radiation:  7 months 01/11/16 - 02/04/16 : Right Breast treated to 42.72 Gy in 16 fractions  Narrative:  The patient returns today for routine follow-up. She rates her pain as a 4 on a scale of 0-10. constant and aching over left knee.  Pt right breast- positive for erythema-reports skin is "thick."  Pt denies edema. Pt continues to apply lotion with Vitamin E-Eucerin as directed.                             ALLERGIES:  is allergic to tetanus toxoids and tetanus toxoid adsorbed.  Meds: Current Outpatient Prescriptions  Medication Sig Dispense Refill  . anastrozole (ARIMIDEX) 1 MG tablet Take 1 tablet (1 mg total) by mouth daily. 30 tablet 3  . B Complex Vitamins (B-COMPLEX/B-12 SL) Place 1,000 mcg/day under the tongue daily.    . Calcium Citrate-Vitamin D (CALCIUM CITRATE CHEWY BITE) 500-500 MG-UNIT CHEW Chew 500 mg by mouth. One soft chews twice daily    . Calcium-Vitamin D-Iron (RA CALCIUM PLUS IRON/VIT D PO) Take 1 tablet by mouth at bedtime.    . docusate sodium (COLACE) 100 MG capsule Take 100 mg by mouth daily. Daily at bedtime    . esomeprazole (NEXIUM) 20 MG capsule Take 20 mg by mouth daily at 12 noon.    . famotidine (PEPCID) 20 MG tablet Take 20 mg by mouth daily.     . fluticasone-salmeterol (ADVAIR HFA) 115-21 MCG/ACT inhaler Inhale 2 puffs into the lungs as needed.     . magnesium oxide (MAG-OX) 400 MG tablet Take 400 mg by mouth daily.    . Melatonin 3 MG TABS Take 3 mg by mouth as needed.     . Menaquinone-7 (VITAMIN  K2) 100 MCG CAPS Take 100 mcg/day by mouth daily after supper.    [name] MILK THISTLE EXTRACT PO Take 1 tablet by mouth daily. 1000mg     . Multiple Vitamin (MULTIVITAMIN) capsule Take 2 capsules by mouth daily.     . pravastatin (PRAVACHOL) 20 MG tablet Take 20 mg by mouth daily.    [name] telmisartan-hydrochlorothiazide (MICARDIS HCT) 80-25 MG per tablet Take 0.5 tablets by mouth daily.      No current facility-administered medications for this encounter.     Physical Findings: The patient is in no acute distress. Patient is alert and oriented.  weight is 198 lb 12.8 oz (90.2 kg). Her oral temperature is 98.6 F (37 C). Her blood pressure is 114/97 (abnormal) and her pulse is 109 (abnormal). Her respiration is 12 and oxygen saturation is 100%.  No significant changes. Cosmesis is fair with mild hyperpigmentation of the treated breast, lift and size reduction.  The treated breast tissue is more dense with no dominant masses.  No axillary adenopathy.  Lab Findings: Lab Results  Component Value Date   WBC 5.1 04/07/2016   HGB 12.6 04/07/2016   HCT 37.2 04/07/2016   PLT 183 04/07/2016    Lab  Results  Component Value Date   NA 138 04/07/2016   K 4.1 04/07/2016   CO2 28 04/07/2016   GLUCOSE 98 04/07/2016   BUN 9 04/07/2016   CREATININE 0.57 04/07/2016   BILITOT 0.5 04/07/2016   ALKPHOS 107 04/07/2016   AST 68 (H) 04/07/2016   ALT 40 04/07/2016   PROT 6.9 04/07/2016   ALBUMIN 3.9 04/07/2016   CALCIUM 9.5 04/07/2016   ANIONGAP 8 04/07/2016    Radiographic Findings: No results found.  Impression:  The patient is recovering from the effects of radiation.    Plan:  The patient is scheduled for mammograms in March and will follow at the [facility].  _____________________________________  Sheral Apley. Tammi Klippel, M.D.  This document serves as a record of services personally performed by [name], MD and Freeman Caldron, PA-C. It was created on their behalf by Maryla Morrow, a trained medical scribe. The creation of this record is based on the scribe's personal observations and the provider's statements to them. This document has been checked and approved by the attending provider.

## 2016-09-26 ENCOUNTER — Other Ambulatory Visit (HOSPITAL_COMMUNITY): Payer: Self-pay | Admitting: Hematology & Oncology

## 2016-09-26 DIAGNOSIS — C50211 Malignant neoplasm of upper-inner quadrant of right female breast: Secondary | ICD-10-CM | POA: Diagnosis not present

## 2016-09-26 DIAGNOSIS — I890 Lymphedema, not elsewhere classified: Secondary | ICD-10-CM | POA: Diagnosis not present

## 2016-09-27 ENCOUNTER — Other Ambulatory Visit (HOSPITAL_COMMUNITY): Payer: Self-pay | Admitting: Emergency Medicine

## 2016-09-27 DIAGNOSIS — C50211 Malignant neoplasm of upper-inner quadrant of right female breast: Secondary | ICD-10-CM

## 2016-09-27 MED ORDER — ANASTROZOLE 1 MG PO TABS: 1.0000 mg | ORAL_TABLET | Freq: Every day | ORAL | 2 refills | Status: DC

## 2016-09-27 NOTE — Progress Notes (Signed)
Pt called and wanted to get a 90 day refill on her arimidex instead of just month to month and having to wait on her pharmacy to request the refill.  I spoke with Kirby Crigler and order 90 day supply with 2 refills for the pt.

## 2016-09-28 ENCOUNTER — Ambulatory Visit (INDEPENDENT_AMBULATORY_CARE_PROVIDER_SITE_OTHER): Payer: Medicare Other

## 2016-09-28 ENCOUNTER — Encounter (INDEPENDENT_AMBULATORY_CARE_PROVIDER_SITE_OTHER): Payer: Self-pay | Admitting: Orthopaedic Surgery

## 2016-09-28 ENCOUNTER — Ambulatory Visit (INDEPENDENT_AMBULATORY_CARE_PROVIDER_SITE_OTHER): Payer: Medicare Other | Admitting: Orthopaedic Surgery

## 2016-09-28 VITALS — BP 129/69 | HR 85 | Resp 14 | Ht 63.0 in | Wt 185.0 lb

## 2016-09-28 DIAGNOSIS — M25562 Pain in left knee: Secondary | ICD-10-CM

## 2016-09-28 DIAGNOSIS — G8929 Other chronic pain: Secondary | ICD-10-CM

## 2016-09-28 DIAGNOSIS — M25462 Effusion, left knee: Secondary | ICD-10-CM | POA: Diagnosis not present

## 2016-09-28 MED ORDER — BUPIVACAINE HCL 0.5 % IJ SOLN
3.0000 mL | INTRAMUSCULAR | Status: AC | PRN
  Administered 2016-09-28: 3 mL via INTRA_ARTICULAR

## 2016-09-28 MED ORDER — METHYLPREDNISOLONE ACETATE 40 MG/ML IJ SUSP
80.0000 mg | INTRAMUSCULAR | Status: AC | PRN
  Administered 2016-09-28: 80 mg

## 2016-09-28 MED ORDER — LIDOCAINE HCL 1 % IJ SOLN
5.0000 mL | INTRAMUSCULAR | Status: AC | PRN
  Administered 2016-09-28: 5 mL

## 2016-09-28 NOTE — Progress Notes (Signed)
Office Visit Note   Patient: Caitlin [name]           Date of Birth: [birth year]           MRN: [id number] Visit Date: 09/28/2016              Requested by: Sharilyn Sites, MD [address] [place], [place] [address] PCP: [name], MD   Assessment & Plan: Visit Diagnoses persistent pain and effusion left knee status post left knee arthroscopy 2-1/2 months:   Plan: Aspirate left knee, inject cortisone, send fluid for analysis, films of pelvis and left knee. Knee fluid was clear. I will send this for cell, cell count and crystals. I suspect her pain and recurrent and recurrent effusion or related to the arthritis in her knee exacerbation of synovitis related to the arthroscopy. Follow-Up Instructions: No Follow-up on file.   Orders:  No orders of the defined types were placed in this encounter.  No orders of the defined types were placed in this encounter.     Procedures: Large Joint Inj Date/Time: 09/28/2016 3:32 PM Performed by: [name] Authorized by: [name]   Consent Given by:  Patient Timeout: prior to procedure the correct patient, procedure, and site was verified   Indications:  Pain and joint swelling Location:  Knee Site:  L knee Prep: patient was prepped and draped in usual sterile fashion   Needle Size:  25 G Needle Length:  1.5 inches Approach:  Anteromedial Ultrasound Guidance: No   Fluoroscopic Guidance: No   Arthrogram: No   Medications:  5 mL lidocaine 1 %; 80 mg methylPREDNISolone acetate 40 MG/ML; 3 mL bupivacaine 0.5 % Aspiration Attempted: Yes   Aspirate amount (mL):  15 Aspirate:  Yellow and clear Lab: fluid sent for laboratory analysis   Patient tolerance:  Patient tolerated the procedure well with no immediate complications     Clinical Data: No additional findings.   Subjective: Chief Complaint  Patient presents with  . Left Knee - Pain, Edema    Ms. Famous here today for chronic left knee pain. The  injection helped when she was on her trip but started hurting again 2 weeks ago.  Mrs. Macfarlane had a left knee arthroscopy 2-1/2 months ago with a partial medial meniscectomy and chondroplasty of the medial compartment. She's had a difficult time postoperatively with pain and recently an effusion. I did inject her knee with cortisone in early January and in anticipation of her trip to [place] with her family .she did well for several weeks only to have it recur. She denies fever or chills.  Review of Systems   Objective: Vital Signs: There were no vitals taken for this visit.  Physical Exam  Ortho Exam left knee exam was minimally warm compared to the right. Full extension and flexion over 100. No instability. More lateral than medial joint pain. Small effusion. No popliteal pain. No calf discomfort. No swelling distally. Mild pain with range of motion of left hip negative straight leg raise  Specialty Comments:  No specialty comments available.  Imaging: No results found.   PMFS History: Patient Active Problem List   Diagnosis Date Noted  . Chondromalacia patellae, left knee 05/26/2016  . Breast cancer of upper-inner quadrant of right female breast ([facility]) 11/04/2015  . GERD (gastroesophageal reflux disease) 09/02/2013  . Obesity 09/02/2013  . Ventral hernia, recurrent 09/02/2013  . Unspecified asthma(493.90) 12/12/2012  . Cough 03/27/2012  . Right middle lobe syndrome 03/27/2012  Past Medical History:  Diagnosis Date  . Asthma   . Breast cancer ([facility])   . Breast disorder    cancer  . Breast mass    benign  . GERD (gastroesophageal reflux disease)   . History of hiatal hernia   . Hyperlipidemia   . Hypertension   . Impaired fasting glucose   . Rectocele     Family History  Problem Relation Age of Onset  . Colon cancer Father   . Rectal cancer Father   . Prostate cancer Father   . Dementia Mother   . Osteoporosis Mother   . Healthy Daughter   . Healthy Son     . Pancreatic cancer Paternal Grandfather   . Heart disease Paternal Grandmother   . Heart disease Maternal Grandfather   . Breast cancer Maternal Grandmother   . Scoliosis Sister     Past Surgical History:  Procedure Laterality Date  . BREAST LUMPECTOMY WITH RADIOACTIVE SEED AND SENTINEL LYMPH NODE BIOPSY Right 11/11/2015   Procedure: RIGHT BREAST RADIOACTIVE SEED GUIDED  LUMPECTOMY AND RADIOACTIVE SEED TARGETED SENTINEL LYMPH NODE BIOPSY;  Surgeon: Stark Klein, MD;  Location: [facility];  Service: General;  Laterality: Right;  . BREAST SURGERY Left    breast biopies x4  . CHOLECYSTECTOMY  09/08/2010  . COLONOSCOPY    . COLONOSCOPY N/A 09/25/2013   Procedure: COLONOSCOPY;  Surgeon: Rogene Houston, MD;  [facility];  Service: Endoscopy;  Laterality: N/A;  100-moved to 1200 Ann to notify pt  . HERNIA REPAIR  12/24/2010, 12-2014  . LAPAROSCOPIC GASTRIC SLEEVE RESECTION  11-2013   at [place]  2007/2008  . TONSILLECTOMY  1968  . UPPER GASTROINTESTINAL ENDOSCOPY    . VAGINAL HYSTERECTOMY  1980s   Social History   Occupational History  . retired Retired    Pharmacist, hospital   Social History Main Topics  . Smoking status: Never Smoker  . Smokeless tobacco: Never Used  . Alcohol use Yes     Comment: occasional wine  . Drug use: No  . Sexual activity: Yes    Birth control/ protection: Surgical     Comment: hyst

## 2016-10-05 ENCOUNTER — Encounter (HOSPITAL_COMMUNITY): Payer: Self-pay

## 2016-10-05 ENCOUNTER — Encounter (HOSPITAL_COMMUNITY): Payer: Medicare Other | Attending: Oncology | Admitting: Oncology

## 2016-10-05 VITALS — BP 129/66 | HR 79 | Temp 98.2°F | Resp 16 | Wt 197.4 lb

## 2016-10-05 DIAGNOSIS — I10 Essential (primary) hypertension: Secondary | ICD-10-CM | POA: Insufficient documentation

## 2016-10-05 DIAGNOSIS — D7589 Other specified diseases of blood and blood-forming organs: Secondary | ICD-10-CM | POA: Diagnosis not present

## 2016-10-05 DIAGNOSIS — K219 Gastro-esophageal reflux disease without esophagitis: Secondary | ICD-10-CM | POA: Insufficient documentation

## 2016-10-05 DIAGNOSIS — Z79811 Long term (current) use of aromatase inhibitors: Secondary | ICD-10-CM

## 2016-10-05 DIAGNOSIS — E785 Hyperlipidemia, unspecified: Secondary | ICD-10-CM | POA: Insufficient documentation

## 2016-10-05 DIAGNOSIS — C50211 Malignant neoplasm of upper-inner quadrant of right female breast: Secondary | ICD-10-CM

## 2016-10-05 DIAGNOSIS — Z170 Estrogen receptor positive status [ER+]: Secondary | ICD-10-CM | POA: Diagnosis not present

## 2016-10-05 DIAGNOSIS — Z79899 Other long term (current) drug therapy: Secondary | ICD-10-CM | POA: Insufficient documentation

## 2016-10-05 MED ORDER — ANASTROZOLE 1 MG PO TABS: 1.0000 mg | ORAL_TABLET | Freq: Every day | ORAL | 2 refills | Status: DC

## 2016-10-05 NOTE — Progress Notes (Signed)
[place]  Progress Note  Patient Care Team: Sharilyn Sites, MD as PCP - General  CHIEF COMPLAINTS/PURPOSE OF CONSULTATION:  pT1cN0M0 ER+ PR+ HER 2 neu - carcinoma of the R breast Lumpectomy/sentinel node biopsy with Dr. Barry Dienes on 11/11/2015    Breast cancer of upper-inner quadrant of right female breast ([place])   09/24/2014 Imaging    DEXA with normal bone density      10/06/2015 Imaging    Screening bilateral mammogram BI-RADS Category 0, R breast possible mass, L breast possible mass      10/20/2015 Imaging    Diagnostic B mammogram, R breast Ultrasound with suspicious mass in the R breast at the 1 o clock location 5 cm from the nipple, LN in the low R axillae with cortex nodularity      10/27/2015 Initial Biopsy    Ultrasound guided biopsy of R axillary LN, BENIGN. Ultrasound guided biopsy of R breast with invasive ductal carcinoma      11/04/2015 Initial Diagnosis    Breast cancer of upper-inner quadrant of right female breast ([place])      11/09/2015 Procedure    Radioactive seed localization R breast      11/11/2015 Surgery    R breast radioactive seed localized lumpectomy and seed targeted sentinel LN biopsy      11/11/2015 Pathology Results    Invasive ductal carcinoma Grade I/III spanning 1.1 cm, lobular neoplasia (LCIS) resection margins negative. 3 negative sentinel nodes ER (100%), PR (100%), Her2 neu negative pT1c, pN0      12/11/2015 Oncotype testing    Recurrence Score result 11, 10 year risk of distant recurrence with tamoxifen alone 8% (low-risk)      01/11/2016 - 02/04/2016 Radiation Therapy    Adjuvant breast radiation ([facility]): 42.72 Gy in 16 fractions to the right breast      01/2016 -  Anti-estrogen oral therapy    Anastrazole 1 mg daily. Planned duration of therapy: 5 years.        HISTORY OF PRESENTING ILLNESS:  Caitlin [name] 71 y.o. female is here because of Stage I infiltrating ductal carcinoma with associated LCIS,  ER+  PR+ and HER2-.   She has been doing well. She is due for a mammogram this month, but has not scheduled it. She is tolerating Anastrozole well. She tore her meniscus in her left knee. She was given a cortisone shot and the fluid was drained; she is feeling much better. Denies hot flashes, breast lumps, leg swelling, or any other concerns.   MEDICAL HISTORY:  Past Medical History:  Diagnosis Date  . Asthma   . Breast cancer ([place])   . Breast disorder    cancer  . Breast mass    benign  . GERD (gastroesophageal reflux disease)   . History of hiatal hernia   . Hyperlipidemia   . Hypertension   . Impaired fasting glucose   . Rectocele     SURGICAL HISTORY: Past Surgical History:  Procedure Laterality Date  . BREAST LUMPECTOMY WITH RADIOACTIVE SEED AND SENTINEL LYMPH NODE BIOPSY Right 11/11/2015   Procedure: RIGHT BREAST RADIOACTIVE SEED GUIDED  LUMPECTOMY AND RADIOACTIVE SEED TARGETED SENTINEL LYMPH NODE BIOPSY;  Surgeon: Stark Klein, MD;  Location: [facility];  Service: General;  Laterality: Right;  . BREAST SURGERY Left    breast biopies x4  . CHOLECYSTECTOMY  09/08/2010  . COLONOSCOPY    . COLONOSCOPY N/A 09/25/2013   Procedure: COLONOSCOPY;  Surgeon: Rogene Houston, MD;  [facility];  Service: Endoscopy;  Laterality: N/A;  100-moved to 1200 Ann to notify pt  . HERNIA REPAIR  12/24/2010, 12-2014  . LAPAROSCOPIC GASTRIC SLEEVE RESECTION  11-2013   at [place]  2007/2008  . TONSILLECTOMY  1968  . UPPER GASTROINTESTINAL ENDOSCOPY    . VAGINAL HYSTERECTOMY  1980s    SOCIAL HISTORY: Social History   Social History  . Marital status: Married    Spouse name: N/A  . Number of children: 2  . Years of education: N/A   Occupational History  . retired Retired    Pharmacist, hospital   Social History Main Topics  . Smoking status: Never Smoker  . Smokeless tobacco: Never Used  . Alcohol use Yes     Comment: occasional wine  .  Drug use: No  . Sexual activity: Yes    Birth control/ protection: Surgical     Comment: hyst   Other Topics Concern  . Not on file   Social History Narrative  . No narrative on file  Married for 44 years. 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  From [place], [place] Father was 44 when he passed had rectal/colon cancer when he was 69 years old. Died from congestive heart failure. Mother was 4 when she passed; had dementia 3 sisters-Older sister had health problems Maternal Grandmother had breast cancer. Natural Menopause during late 82's  FAMILY HISTORY: Family History  Problem Relation Age of Onset  . Colon cancer Father   . Rectal cancer Father   . Prostate cancer Father   . Dementia Mother   . Osteoporosis Mother   . Healthy Daughter   . Healthy Son   . Pancreatic cancer Paternal Grandfather   . Heart disease Paternal Grandmother   . Heart disease Maternal Grandfather   . Breast cancer Maternal Grandmother   . Scoliosis Sister     ALLERGIES:  is allergic to tetanus toxoids and tetanus toxoid adsorbed.  MEDICATIONS:  Current Outpatient Prescriptions  Medication Sig Dispense Refill  . anastrozole (ARIMIDEX) 1 MG tablet Take 1 tablet (1 mg total) by mouth daily. 90 tablet 2  . B Complex Vitamins (B-COMPLEX/B-12 SL) Place 1,000 mcg/day under the tongue daily.    . Calcium Citrate-Vitamin D (CALCIUM CITRATE CHEWY BITE) 500-500 MG-UNIT CHEW Chew 500 mg by mouth. One soft chews twice daily    . Calcium-Vitamin D-Iron (RA CALCIUM PLUS IRON/VIT D PO) Take 1 tablet by mouth at bedtime.    . docusate sodium (COLACE) 100 MG capsule Take 100 mg by mouth daily. Daily at bedtime    . esomeprazole (NEXIUM) 20 MG capsule Take 20 mg by mouth daily at 12 noon.    . famotidine (PEPCID) 20 MG tablet Take 20 mg by mouth daily.     .  fluticasone-salmeterol (ADVAIR HFA) 115-21 MCG/ACT inhaler Inhale 2 puffs into the lungs as needed.     . magnesium oxide (MAG-OX) 400 MG tablet Take 400 mg by mouth daily.    . Melatonin 3 MG TABS Take 3 mg by mouth as needed.     . Menaquinone-7 (VITAMIN K2) 100 MCG CAPS Take 100 mcg/day by mouth daily after supper.    [name] MILK THISTLE EXTRACT PO Take 1 tablet by mouth daily. 1020m    . Multiple Vitamin (MULTIVITAMIN) capsule  Take 2 capsules by mouth daily.     . pravastatin (PRAVACHOL) 20 MG tablet Take 20 mg by mouth daily.    [name] telmisartan-hydrochlorothiazide (MICARDIS HCT) 80-25 MG per tablet Take 0.5 tablets by mouth daily.      No current facility-administered medications for this visit.     Review of Systems  Constitutional: Negative.        No hot flashes No breast lumps  HENT: Negative.   Eyes: Negative.   Respiratory: Negative.   Cardiovascular: Negative.  Negative for leg swelling.  Gastrointestinal: Negative.   Genitourinary: Negative.   Musculoskeletal: Positive for joint pain (L knee, meniscus tear).  Skin: Negative.   Neurological: Negative.   Endo/Heme/Allergies: Negative.   Psychiatric/Behavioral: Negative.   All other systems reviewed and are negative. 14 point ROS was done and is otherwise as detailed above or in HPI  PHYSICAL EXAMINATION: ECOG PERFORMANCE STATUS: 0 - Asymptomatic  Vitals:   10/05/16 1125  BP: 129/66  Pulse: 79  Resp: 16  Temp: 98.2 F (36.8 C)   Filed Weights   10/05/16 1125  Weight: 197 lb 6.4 oz (89.5 kg)   Physical Exam  Constitutional: She is oriented to person, place, and time and well-developed, well-nourished, and in no distress.  HENT:  Head: Normocephalic and atraumatic.  Nose: Nose normal.  Mouth/Throat: Oropharynx is clear and moist. No oropharyngeal exudate.  Eyes: Conjunctivae and EOM are normal. Pupils are equal, round, and reactive to light. Right eye exhibits no discharge. Left eye exhibits no discharge. No  scleral icterus.  Neck: Normal range of motion. Neck supple. No tracheal deviation present. No thyromegaly present.  Cardiovascular: Normal rate, regular rhythm and normal heart sounds.  Exam reveals no gallop and no friction rub.   No murmur heard. Pulmonary/Chest: Effort normal and breath sounds normal. She has no wheezes. She has no rales. Right breast exhibits no mass and no nipple discharge. Left breast exhibits no mass, no nipple discharge and no skin change.    Abdominal: Soft. Bowel sounds are normal. She exhibits no distension and no mass. There is no tenderness. There is no rebound and no guarding.  Musculoskeletal: Normal range of motion. She exhibits no edema.  Lymphadenopathy:    She has no cervical adenopathy.  Neurological: She is alert and oriented to person, place, and time. She has normal reflexes. No cranial nerve deficit. Gait normal. Coordination normal.  Skin: Skin is warm and dry. No rash noted.  Psychiatric: Mood, memory, affect and judgment normal.  Nursing note and vitals reviewed.  LABORATORY DATA:  I have reviewed the data as listed Lab Results  Component Value Date   WBC 5.1 04/07/2016   HGB 12.6 04/07/2016   HCT 37.2 04/07/2016   MCV 103.3 (H) 04/07/2016   PLT 183 04/07/2016   CMP     Component Value Date/Time   NA 138 04/07/2016 1009   K 4.1 04/07/2016 1009   CL 102 04/07/2016 1009   CO2 28 04/07/2016 1009   GLUCOSE 98 04/07/2016 1009   BUN 9 04/07/2016 1009   CREATININE 0.57 04/07/2016 1009   CALCIUM 9.5 04/07/2016 1009   PROT 6.9 04/07/2016 1009   ALBUMIN 3.9 04/07/2016 1009   AST 68 (H) 04/07/2016 1009   ALT 40 04/07/2016 1009   ALKPHOS 107 04/07/2016 1009   BILITOT 0.5 04/07/2016 1009   GFRNONAA >60 04/07/2016 1009   GFRAA >60 04/07/2016 1009   RADIOGRAPHIC STUDIES: I have personally reviewed the radiological images as listed  and agreed with the findings in the report.  Diagnostic Mammogram 10/20/2015 IMPRESSION: 1. Suspicious mass  in the right breast at the approximate 1 o'clock location.  2. Lymph node in the low right axilla with cortex nodularity.  PATHOLOGY:      ASSESSMENT & PLAN:  pT1cN0M0 ER+ PR+ HER 2 neu - carcinoma of the R breast, upper inner quadrant ER+ 100% PR + 100% HER 2 - Lumpectomy/sentinel node biopsy with Dr. Barry Dienes on 11/11/2015 Bone Density 09/2014 NORMAL Adjuvant XRT ONCOTYPE low risk Macrocytosis   Refill Anastrozole. She would like a 3 month supply and enough refills to get her to her next appointment.   Order her right diagnostic mammogram and left screening mammogram for later this month.   She will return for follow up in 6 months.   All questions were answered. The patient knows to call the clinic with any problems, questions or concerns.  This document serves as a record of services personally performed by Twana First, MD. It was created on her behalf by Martinique Casey, a trained medical scribe. The creation of this record is based on the scribe's personal observations and the provider's statements to them. This document has been checked and approved by the attending provider.  I have reviewed the above documentation for accuracy and completeness, and I agree with the above.  This note was electronically signed.   10/05/2016 11:29 AM

## 2016-10-05 NOTE — Patient Instructions (Signed)
[place] at [facility] Discharge Instructions  RECOMMENDATIONS MADE BY THE CONSULTANT AND ANY TEST RESULTS WILL BE SENT TO YOUR REFERRING PHYSICIAN.  You were seen today by Dr. Twana First Mammogram at end of March Follow up in 6 months with labs See Amy up front for appointments   Thank you for choosing [place] at [facility] to provide your oncology and hematology care.  To afford each patient quality time with our provider, please arrive at least 15 minutes before your scheduled appointment time.    If you have a lab appointment with the [facility] please come in thru the  Main Entrance and check in at the main information desk  You need to re-schedule your appointment should you arrive 10 or more minutes late.  We strive to give you quality time with our providers, and arriving late affects you and other patients whose appointments are after yours.  Also, if you no show three or more times for appointments you may be dismissed from the clinic at the providers discretion.     Again, thank you for choosing [facility].  Our hope is that these requests will decrease the amount of time that you wait before being seen by our physicians.       _____________________________________________________________  Should you have questions after your visit to [facility], please contact our office at [phone] between the hours of 8:30 a.m. and 4:30 p.m.  Voicemails left after 4:30 p.m. will not be returned until the following business day.  For prescription refill requests, have your pharmacy contact our office.       Resources For Cancer Patients and their Caregivers ? American Cancer Society: Can assist with transportation, wigs, general needs, runs Look Good Feel Better.        250-247-5019 ? Cancer Care: Provides financial assistance, online support groups, medication/co-pay assistance.  1-800-813-HOPE  219-333-5098) ? [place] Assists [place] cancer patients and their families through emotional , educational and financial support.  (662)141-6044 ? [place] DSS Where to apply for food stamps, Medicaid and utility assistance. 606-118-0153 ? [facility]: Transportation to medical appointments. (581)352-8257 ? Social Security Administration: May apply for disability if have a Stage IV cancer. 814 081 4492 (585)670-1641 ? [employer], Disability and Transit Services: Assists with nutrition, care and transit needs. [place] Support Programs: @10RELATIVEDAYS @ > Cancer Support Group  2nd Tuesday of the month 1pm-2pm, Journey Room  > Creative Journey  3rd Tuesday of the month 1130am-1pm, Journey Room  > Look Good Feel Better  1st Wednesday of the month 10am-12 noon, Journey Room (Call [place] to register [phone])

## 2016-10-13 ENCOUNTER — Ambulatory Visit (HOSPITAL_COMMUNITY): Payer: Medicare Other | Attending: General Surgery | Admitting: Physical Therapy

## 2016-10-13 DIAGNOSIS — I890 Lymphedema, not elsewhere classified: Secondary | ICD-10-CM | POA: Insufficient documentation

## 2016-10-13 NOTE — Therapy (Signed)
[place], [place], [address] Phone: [phone]   Fax:  [phone]  Physical Therapy Evaluation  Patient Details  Name: Caitlin [name] MRN: [id number] Date of Birth: 07-01-1946 Referring Provider: Stark Klein  Encounter Date: 10/13/2016      Caitlin End of Session - 10/13/16 0949    Visit Number 1   Number of Visits 6   Date for Caitlin Re-Evaluation 11/12/16   Authorization Type medicare    Authorization - Visit Number 1   Authorization - Number of Visits 6   Caitlin Start Time 0900   Caitlin Stop Time 0945   Caitlin Time Calculation (min) 45 min   Activity Tolerance Patient tolerated treatment well   Behavior During Therapy [facility] for tasks assessed/performed      Past Medical History:  Diagnosis Date  . Asthma   . Breast cancer ([place])   . Breast disorder    cancer  . Breast mass    benign  . GERD (gastroesophageal reflux disease)   . History of hiatal hernia   . Hyperlipidemia   . Hypertension   . Impaired fasting glucose   . Rectocele     Past Surgical History:  Procedure Laterality Date  . BREAST LUMPECTOMY WITH RADIOACTIVE SEED AND SENTINEL LYMPH NODE BIOPSY Right 11/11/2015   Procedure: RIGHT BREAST RADIOACTIVE SEED GUIDED  LUMPECTOMY AND RADIOACTIVE SEED TARGETED SENTINEL LYMPH NODE BIOPSY;  Surgeon: Stark Klein, MD;  Location: [facility];  Service: General;  Laterality: Right;  . BREAST SURGERY Left    breast biopies x4  . CHOLECYSTECTOMY  09/08/2010  . COLONOSCOPY    . COLONOSCOPY N/A 09/25/2013   Procedure: COLONOSCOPY;  Surgeon: Rogene Houston, MD;  [facility];  Service: Endoscopy;  Laterality: N/A;  100-moved to 1200 Ann to notify Caitlin  . HERNIA REPAIR  12/24/2010, 12-2014  . LAPAROSCOPIC GASTRIC SLEEVE RESECTION  11-2013   at [place]  2007/2008  . TONSILLECTOMY  1968  . UPPER GASTROINTESTINAL ENDOSCOPY    . VAGINAL HYSTERECTOMY  1980s     There were no vitals filed for this visit.       Subjective Assessment - 10/13/16 0849    Subjective Caitlin [name] states that she noticed some heaviness and hardness in her left breast after having 18 treatments of radiation.   She has not noticed any increased swelling in her arms.     Pertinent History 10/27/2015 Rt breast bioplsy, 11/09/15 radiation, 11/11/2015 lumpectomy currently on hormonal therapy    Currently in Pain? No/denies            [facility] Caitlin Assessment - 10/13/16 0001      Assessment   Medical Diagnosis lymphedema   Referring Provider Stark Klein   Onset Date/Surgical Date 04/01/16   Next MD Visit 04/15/2016   Prior Therapy none     Precautions   Precautions None     Restrictions   Weight Bearing Restrictions No     Balance Screen   Has the patient fallen in the past 6 months No   Has the patient had a decrease in activity level because of a fear of falling?  No   Is the patient reluctant to leave their home because of a fear of falling?  No     Prior Function   Level of Independence Independent     Cognition   Overall Cognitive Status Within Functional Limits  for tasks assessed     Observation/Other Assessments   Focus on Therapeutic Outcomes (FOTO)  --  Life impact score 22           LYMPHEDEMA/ONCOLOGY QUESTIONNAIRE - 10/13/16 0901      Type   Cancer Type Rt breast      Surgeries   Lumpectomy Date 11/09/15     Treatment   Past Radiation Treatment Yes   Current Hormone Treatment Yes   Date --  current      What other symptoms do you have   Are you Having Heaviness or Tightness Yes   Are you having Pain No   Are you having pitting edema Yes   Body Site right breast    Is it Hard or Difficult finding clothes that fit No   Do you have infections No   Is there Decreased scar mobility No     Lymphedema Stage   Stage STAGE 2 SPONTANEOUSLY IRREVERSIBLE     Lymphedema Assessments   Lymphedema Assessments Upper extremities      Right Upper Extremity Lymphedema   At Axilla  35 cm   15 cm Proximal to Olecranon Process 35.6 cm   10 cm Proximal to Olecranon Process 33 cm   Olecranon Process 27.2 cm   15 cm Proximal to Ulnar Styloid Process 23 cm   10 cm Proximal to Ulnar Styloid Process 19.3 cm   Just Proximal to Ulnar Styloid Process 16.1 cm   Across Hand at [employer] 19.3 cm   At [place] of 2nd Digit 6.5 cm   At [facility] of Thumb 7 cm     Left Upper Extremity Lymphedema   At Axilla  35.2 cm   15 cm Proximal to Olecranon Process 32.9 cm   10 cm Proximal to Olecranon Process 29.8 cm   Olecranon Process 25.7 cm   15 cm Proximal to Ulnar Styloid Process 21.9 cm   10 cm Proximal to Ulnar Styloid Process 18.2 cm   Just Proximal to Ulnar Styloid Process 16.2 cm   Across Hand at [employer] 19.2 cm   At [place] of 2nd Digit 6.3 cm   At [facility] of Thumb 6.3 cm                [facility] Adult Caitlin Treatment/Exercise - 10/13/16 0001      Manual Therapy   Manual Therapy Manual Lymphatic Drainage (MLD)   Manual therapy comments done seperate from all other aspects of treatment    Manual Lymphatic Drainage (MLD) To include supraclavicular, deep and superfical abdominal, routing fluid using intra axillary and Rt axillary/inguinal anastomsis followed by anterior chest , followed by intraxillary and Rt axillary/inguinal anastomosis with good results                 Caitlin Education - 10/13/16 0917    Education provided (P)  Yes   Education Details (P)  Lymphedema, self massage    Person(s) Educated (P)  Patient   Methods (P)  Explanation;Handout   Comprehension (P)  Verbalized understanding          Caitlin Short Term Goals - 10/13/16 1006      Caitlin SHORT TERM GOAL #1   Title Caitlin to notice a 50% reduction in induration of her Rt breast.     Time 10   Period Days   Status New     Caitlin SHORT TERM GOAL #2   Title Caitlin to be completing self manual techniques  at least 3 times a week to assist in reduction of induration.    Time 10   Period Days   Status New           Caitlin Long Term Goals - 2016/10/20 1008      Caitlin LONG TERM GOAL #1   Title Caitlin to note a 75% reduction in the induration and swelling  of her Rt breast.    Time 3   Period Weeks   Status New     Caitlin LONG TERM GOAL #2   Title Caitlin to feel confident in self manual techniques and be performing them at least five days a week to prevent any increase edema in rt breast.    Time 3   Period Weeks   Status New               Plan - October 20, 2016 1001    Clinical Impression Statement Caitlin [name] is a 71 yo female who has had 3 lymph nodes removed from her Rt axillary region, 2 biopsies with radiation to her Rt breast.  She now has developed swelling with induration in her Rt breast. She has been referred to skilled physical therapy. Examination demonstrates swelling, induration of Rt breast with minimal swelling of Rt UE.  Caitlin [name] will benefit from skilled physical therapy for manual techniques to decrease her congestion and education on self massaging techniques.  She was advised to go to "[employer]" to purchase a compression bra.     Rehab Potential Good   Caitlin Frequency 2x / week   Caitlin Duration 3 weeks   Caitlin Treatment/Interventions ADLs/Self Care Home Management;Manual techniques;Patient/family education;Manual lymph drainage   Caitlin Next Visit Plan May Add Rt UE into manual if therapist feels appropriate.  Caitlin will bring husband next treatment to learn manual lymph drainage techniques.    Caitlin Home Exercise Plan given a combination of head and neck and UE self massage.    Consulted and Agree with Plan of Care Patient      Patient will benefit from skilled therapeutic intervention in order to improve the following deficits and impairments:  Increased edema  Visit Diagnosis: Lymphedema, not elsewhere classified      G-Codes - 10-20-16 1010    Functional Assessment Tool Used (Outpatient Only) induration and swelling of right breast/ lymphedema  life impact score    Functional Limitation Other Caitlin primary   Other Caitlin Primary Current Status (U2725) At least 20 percent but less than 40 percent impaired, limited or restricted   Other Caitlin Primary Goal Status (D6644) At least 1 percent but less than 20 percent impaired, limited or restricted       Problem List Patient Active Problem List   Diagnosis Date Noted  . Chondromalacia patellae, left knee 05/26/2016  . Breast cancer of upper-inner quadrant of right female breast ([place]) 11/04/2015  . GERD (gastroesophageal reflux disease) 09/02/2013  . Obesity 09/02/2013  . Ventral hernia, recurrent 09/02/2013  . Unspecified asthma(493.90) 12/12/2012  . Cough 03/27/2012  . Right middle lobe syndrome 03/27/2012    Caitlin [name], Caitlin [name] [phone] Oct 20, 2016, 10:11 AM  [place], [place], [address] Phone: [phone]   Fax:  [phone]  Name: Caitlin [name] MRN: [id number] Date of Birth: 04/29/1946

## 2016-10-18 ENCOUNTER — Ambulatory Visit (HOSPITAL_COMMUNITY): Payer: Medicare Other | Admitting: Physical Therapy

## 2016-10-18 DIAGNOSIS — I890 Lymphedema, not elsewhere classified: Secondary | ICD-10-CM | POA: Diagnosis not present

## 2016-10-18 NOTE — Therapy (Signed)
[place], [place], [address] Phone: [phone]   Fax:  [phone]  Physical Therapy Treatment  Patient Details  Name: Caitlin [name] MRN: [id number] Date of Birth: [birth year] Referring Provider: Stark Klein  Encounter Date: 10/18/2016      PT End of Session - 10/18/16 0918    Visit Number 2   Number of Visits 6   Date for PT Re-Evaluation 11/12/16   Authorization Type medicare    Authorization - Visit Number 2   Authorization - Number of Visits 6   PT Start Time 0820   PT Stop Time 0905   PT Time Calculation (min) 45 min   Activity Tolerance Patient tolerated treatment well   Behavior During Therapy [facility] for tasks assessed/performed      Past Medical History:  Diagnosis Date  . Asthma   . Breast cancer ([place])   . Breast disorder    cancer  . Breast mass    benign  . GERD (gastroesophageal reflux disease)   . History of hiatal hernia   . Hyperlipidemia   . Hypertension   . Impaired fasting glucose   . Rectocele     Past Surgical History:  Procedure Laterality Date  . BREAST LUMPECTOMY WITH RADIOACTIVE SEED AND SENTINEL LYMPH NODE BIOPSY Right 11/11/2015   Procedure: RIGHT BREAST RADIOACTIVE SEED GUIDED  LUMPECTOMY AND RADIOACTIVE SEED TARGETED SENTINEL LYMPH NODE BIOPSY;  Surgeon: Stark Klein, MD;  Location: [place];  Service: General;  Laterality: Right;  . BREAST SURGERY Left    breast biopies x4  . CHOLECYSTECTOMY  09/08/2010  . COLONOSCOPY    . COLONOSCOPY N/A 09/25/2013   Procedure: COLONOSCOPY;  Surgeon: Rogene Houston, MD;  [facility];  Service: Endoscopy;  Laterality: N/A;  100-moved to 1200 Ann to notify pt  . HERNIA REPAIR  12/24/2010, 12-2014  . LAPAROSCOPIC GASTRIC SLEEVE RESECTION  11-2013   at [place]  2007/2008  . TONSILLECTOMY  1968  . UPPER GASTROINTESTINAL ENDOSCOPY    . VAGINAL HYSTERECTOMY  1980s    There  were no vitals filed for this visit.      Subjective Assessment - 10/18/16 0914    Subjective Pt comes today with spouse to observe massage techniques.  STates she is amazed with how much her breast softens with massage.  Reports no pain currently.   Currently in Pain? No/denies                         [facility] Adult PT Treatment/Exercise - 10/18/16 0915      Manual Therapy   Manual Therapy Manual Lymphatic Drainage (MLD)   Manual therapy comments done seperate from all other aspects of treatment    Manual Lymphatic Drainage (MLD) To include supraclavicular, deep and superfical abdominal, and lumbar routing fluid to Lt axillary and Rt inguinal nodes.  completed anterior and posterior (in Lt sidelying)  Primary focus on Rt breast but also proximal Rt UE to elbow level onlyl                  PT Short Term Goals - 10/18/16 0175      PT SHORT TERM GOAL #1   Title Pt to notice a 50% reduction in induration of her Rt breast.     Time 10   Period Days   Status On-going     PT SHORT  TERM GOAL #2   Title Pt to be completing self manual techniques at least 3 times a week to assist in reduction of induration.   Time 10   Period Days   Status On-going           PT Long Term Goals - 10/18/16 6599      PT LONG TERM GOAL #1   Title Pt to note a 75% reduction in the induration and swelling  of her Rt breast.    Time 3   Period Weeks   Status On-going     PT LONG TERM GOAL #2   Title Pt to feel confident in self manual techniques and be performing them at least five days a week to prevent any increase edema in rt breast.    Time 3   Period Weeks   Status On-going               Plan - 10/18/16 3570    Clinical Impression Statement Reviewed initial evaluation goals and copy issued to patient.  Educated pateint/spouse on manual lymph drainage with demonstrations throughout session.  Noted reduction in induration in Rt breast at end of session.  Pt or  spouse without questions.  Order to be faxed to oncologist for breast garments.    Rehab Potential Good   PT Frequency 2x / week   PT Duration 3 weeks   PT Treatment/Interventions ADLs/Self Care Home Management;Manual techniques;Patient/family education;Manual lymph drainage   PT Next Visit Plan continue wtih manual techniques.  Give order to pateint when received signed by MD.    PT Home Exercise Plan at evaluation: given a combination of head and neck and UE self massage.    Consulted and Agree with Plan of Care Patient      Patient will benefit from skilled therapeutic intervention in order to improve the following deficits and impairments:  Increased edema  Visit Diagnosis: Lymphedema, not elsewhere classified     Problem List Patient Active Problem List   Diagnosis Date Noted  . Chondromalacia patellae, left knee 05/26/2016  . Breast cancer of upper-inner quadrant of right female breast ([place]) 11/04/2015  . GERD (gastroesophageal reflux disease) 09/02/2013  . Obesity 09/02/2013  . Ventral hernia, recurrent 09/02/2013  . Unspecified asthma(493.90) 12/12/2012  . Cough 03/27/2012  . Right middle lobe syndrome 03/27/2012    Teena Irani, PTA/CLT [phone]  10/18/2016, 9:22 AM  [place], [place], [address] Phone: [phone]   Fax:  [phone]  Name: Caitlin [name] MRN: [id number] Date of Birth: 12-Jan-1946

## 2016-10-20 ENCOUNTER — Ambulatory Visit (HOSPITAL_COMMUNITY): Payer: Medicare Other | Admitting: Physical Therapy

## 2016-10-20 DIAGNOSIS — I890 Lymphedema, not elsewhere classified: Secondary | ICD-10-CM

## 2016-10-20 NOTE — Therapy (Signed)
[place], [place], [address] Phone: [phone]   Fax:  [phone]  Physical Therapy Treatment  Patient Details  Name: Caitlin [name] MRN: [id number] Date of Birth: Nov 24, 1945 Referring Provider: Stark Klein  Encounter Date: 10/20/2016      PT End of Session - 10/20/16 1427    Visit Number 3   Number of Visits 6   Date for PT Re-Evaluation 11/12/16   Authorization Type medicare    Authorization - Visit Number 3   Authorization - Number of Visits 6   PT Start Time 1300   PT Stop Time 1345   PT Time Calculation (min) 45 min   Activity Tolerance Patient tolerated treatment well   Behavior During Therapy [facility] for tasks assessed/performed      Past Medical History:  Diagnosis Date  . Asthma   . Breast cancer ([place])   . Breast disorder    cancer  . Breast mass    benign  . GERD (gastroesophageal reflux disease)   . History of hiatal hernia   . Hyperlipidemia   . Hypertension   . Impaired fasting glucose   . Rectocele     Past Surgical History:  Procedure Laterality Date  . BREAST LUMPECTOMY WITH RADIOACTIVE SEED AND SENTINEL LYMPH NODE BIOPSY Right 11/11/2015   Procedure: RIGHT BREAST RADIOACTIVE SEED GUIDED  LUMPECTOMY AND RADIOACTIVE SEED TARGETED SENTINEL LYMPH NODE BIOPSY;  Surgeon: Stark Klein, MD;  Location: [place];  Service: General;  Laterality: Right;  . BREAST SURGERY Left    breast biopies x4  . CHOLECYSTECTOMY  09/08/2010  . COLONOSCOPY    . COLONOSCOPY N/A 09/25/2013   Procedure: COLONOSCOPY;  Surgeon: Rogene Houston, MD;  [facility];  Service: Endoscopy;  Laterality: N/A;  100-moved to 1200 Ann to notify pt  . HERNIA REPAIR  12/24/2010, 12-2014  . LAPAROSCOPIC GASTRIC SLEEVE RESECTION  11-2013   at [place]  2007/2008  . TONSILLECTOMY  1968  . UPPER GASTROINTESTINAL ENDOSCOPY    . VAGINAL HYSTERECTOMY  1980s    There  were no vitals filed for this visit.      Subjective Assessment - 10/20/16 1412    Subjective Received signed order for compression tank/bra and given copy. Pt is going to try and go tomorrow.  Pt states the massage is working great but doesn't take long for the induration to return.     Currently in Pain? No/denies                         [facility] Adult PT Treatment/Exercise - 10/20/16 1427      Manual Therapy   Manual Therapy Manual Lymphatic Drainage (MLD)   Manual therapy comments done seperate from all other aspects of treatment    Manual Lymphatic Drainage (MLD) To include supraclavicular, deep and superfical abdominal, and lumbar routing fluid to Lt axillary and Rt inguinal nodes.  completed anterior and posterior (in Lt sidelying)  Primary focus on Rt breast but also proximal Rt UE to elbow level onlyl                  PT Short Term Goals - 10/18/16 2035      PT SHORT TERM GOAL #1   Title Pt to notice a 50% reduction in induration of her Rt breast.     Time 10   Period Days  Status On-going     PT SHORT TERM GOAL #2   Title Pt to be completing self manual techniques at least 3 times a week to assist in reduction of induration.   Time 10   Period Days   Status On-going           PT Long Term Goals - 10/18/16 3474      PT LONG TERM GOAL #1   Title Pt to note a 75% reduction in the induration and swelling  of her Rt breast.    Time 3   Period Weeks   Status On-going     PT LONG TERM GOAL #2   Title Pt to feel confident in self manual techniques and be performing them at least five days a week to prevent any increase edema in rt breast.    Time 3   Period Weeks   Status On-going               Plan - 10/20/16 1428    Clinical Impression Statement Completed manual lymph drainage for Rt breast today for anterior and posterior. Good results with no induration present at end of session.     Rehab Potential Good   PT Frequency 2x /  week   PT Duration 3 weeks   PT Treatment/Interventions ADLs/Self Care Home Management;Manual techniques;Patient/family education;Manual lymph drainage   PT Next Visit Plan continue wtih manual techniques to reduce Rt breast induration.  Check to see if garments were acquired next session.   PT Home Exercise Plan at evaluation: given a combination of head and neck and UE self massage.    Consulted and Agree with Plan of Care Patient      Patient will benefit from skilled therapeutic intervention in order to improve the following deficits and impairments:  Increased edema  Visit Diagnosis: Lymphedema, not elsewhere classified     Problem List Patient Active Problem List   Diagnosis Date Noted  . Chondromalacia patellae, left knee 05/26/2016  . Breast cancer of upper-inner quadrant of right female breast ([place]) 11/04/2015  . GERD (gastroesophageal reflux disease) 09/02/2013  . Obesity 09/02/2013  . Ventral hernia, recurrent 09/02/2013  . Unspecified asthma(493.90) 12/12/2012  . Cough 03/27/2012  . Right middle lobe syndrome 03/27/2012    Teena Irani 10/20/2016, 2:39 PM  [place], [place], [address] Phone: [phone]   Fax:  [phone]  Name: Caitlin [name] MRN: [id number] Date of Birth: 03/26/1946

## 2016-10-24 DIAGNOSIS — M25462 Effusion, left knee: Secondary | ICD-10-CM | POA: Diagnosis not present

## 2016-10-24 DIAGNOSIS — M1712 Unilateral primary osteoarthritis, left knee: Secondary | ICD-10-CM | POA: Diagnosis not present

## 2016-10-24 DIAGNOSIS — M170 Bilateral primary osteoarthritis of knee: Secondary | ICD-10-CM | POA: Diagnosis not present

## 2016-10-24 DIAGNOSIS — M25562 Pain in left knee: Secondary | ICD-10-CM | POA: Diagnosis not present

## 2016-10-25 ENCOUNTER — Ambulatory Visit (HOSPITAL_COMMUNITY): Payer: Medicare Other | Admitting: Physical Therapy

## 2016-10-25 DIAGNOSIS — I890 Lymphedema, not elsewhere classified: Secondary | ICD-10-CM

## 2016-10-25 NOTE — Therapy (Signed)
[place], [place], [address] Phone: [phone]   Fax:  [phone]  Physical Therapy Treatment  Patient Details  Name: Caitlin [name] MRN: [id number] Date of Birth: 1945-09-27 Referring Provider: Stark Klein  Encounter Date: 10/25/2016      PT End of Session - 10/25/16 1618    Visit Number 4   Number of Visits 6   Date for PT Re-Evaluation 11/12/16   Authorization Type medicare    Authorization - Visit Number 4   Authorization - Number of Visits 6   PT Start Time 1430   PT Stop Time 1515   PT Time Calculation (min) 45 min   Activity Tolerance Patient tolerated treatment well   Behavior During Therapy [facility] for tasks assessed/performed      Past Medical History:  Diagnosis Date  . Asthma   . Breast cancer ([place])   . Breast disorder    cancer  . Breast mass    benign  . GERD (gastroesophageal reflux disease)   . History of hiatal hernia   . Hyperlipidemia   . Hypertension   . Impaired fasting glucose   . Rectocele     Past Surgical History:  Procedure Laterality Date  . BREAST LUMPECTOMY WITH RADIOACTIVE SEED AND SENTINEL LYMPH NODE BIOPSY Right 11/11/2015   Procedure: RIGHT BREAST RADIOACTIVE SEED GUIDED  LUMPECTOMY AND RADIOACTIVE SEED TARGETED SENTINEL LYMPH NODE BIOPSY;  Surgeon: Stark Klein, MD;  Location: [place];  Service: General;  Laterality: Right;  . BREAST SURGERY Left    breast biopies x4  . CHOLECYSTECTOMY  09/08/2010  . COLONOSCOPY    . COLONOSCOPY N/A 09/25/2013   Procedure: COLONOSCOPY;  Surgeon: Rogene Houston, MD;  [facility];  Service: Endoscopy;  Laterality: N/A;  100-moved to 1200 Ann to notify pt  . HERNIA REPAIR  12/24/2010, 12-2014  . LAPAROSCOPIC GASTRIC SLEEVE RESECTION  11-2013   at [place]  2007/2008  . TONSILLECTOMY  1968  . UPPER GASTROINTESTINAL ENDOSCOPY    . VAGINAL HYSTERECTOMY  1980s    There  were no vitals filed for this visit.      Subjective Assessment - 10/25/16 1614    Subjective Pt has recieved her compression bra but only has one.  She does not have the inserts yet they are to come in the mail.  Pt states she can really tell that the manual helps but then the edema comes back.     Pertinent History 10/27/2015 Rt breast bioplsy, 11/09/15 radiation, 11/11/2015 lumpectomy currently on hormonal therapy    Currently in Pain? No/denies                         [facility] Adult PT Treatment/Exercise - 10/25/16 0001      Manual Therapy   Manual Therapy Manual Lymphatic Drainage (MLD)   Manual therapy comments done seperate from all other aspects of treatment    Manual Lymphatic Drainage (MLD) To include supraclavicular, deep and superfical abdominal, and lumbar routing fluid to Lt axillary and Rt inguinal nodes.  completed anterior and posterior (done in prone) Primary focus on Rt breast but also proximal Rt UE to elbow level onlyl                  PT Short Term Goals - 10/25/16 1622      PT SHORT TERM GOAL #1  Title Pt to notice a 50% reduction in induration of her Rt breast.     Time 10   Period Days   Status Achieved     PT SHORT TERM GOAL #2   Title Pt to be completing self manual techniques at least 3 times a week to assist in reduction of induration.   Time 10   Period Days   Status Achieved           PT Long Term Goals - 10/25/16 1622      PT LONG TERM GOAL #1   Title Pt to note a 75% reduction in the induration and swelling  of her Rt breast.    Time 3   Period Weeks   Status Partially Met     PT LONG TERM GOAL #2   Title Pt to feel confident in self manual techniques and be performing them at least five days a week to prevent any increase edema in rt breast.    Time 3   Period Weeks   Status Partially Met               Plan - 10/25/16 1618    Clinical Impression Statement Re-educated patient that lymphedema is a  chronic condition that can only be controlled and not cured.  Pt vocalized understanding.  Noted decreased induration at end of session.     Rehab Potential Good   PT Frequency 2x / week   PT Duration 3 weeks   PT Treatment/Interventions Patient/family education;Manual lymph drainage;Manual techniques   PT Next Visit Plan Pt to be reassessed next visit. If pt is comfortable with self manual techniques she may be discharged from therapy.       Patient will benefit from skilled therapeutic intervention in order to improve the following deficits and impairments:  Increased edema  Visit Diagnosis: Lymphedema, not elsewhere classified     Problem List Patient Active Problem List   Diagnosis Date Noted  . Chondromalacia patellae, left knee 05/26/2016  . Breast cancer of upper-inner quadrant of right female breast ([place]) 11/04/2015  . GERD (gastroesophageal reflux disease) 09/02/2013  . Obesity 09/02/2013  . Ventral hernia, recurrent 09/02/2013  . Unspecified asthma(493.90) 12/12/2012  . Cough 03/27/2012  . Right middle lobe syndrome 03/27/2012   Rayetta Humphrey, PT CLT [phone] 10/25/2016, 4:23 PM  [place] [address] [place], [place], [address] Phone: [phone]   Fax:  [phone]  Name: Caitlin [name] MRN: [id number] Date of Birth: 03/05/1946

## 2016-10-27 ENCOUNTER — Ambulatory Visit (HOSPITAL_COMMUNITY): Payer: Medicare Other | Admitting: Physical Therapy

## 2016-10-27 DIAGNOSIS — I890 Lymphedema, not elsewhere classified: Secondary | ICD-10-CM

## 2016-10-27 NOTE — Therapy (Signed)
[place], [place], [address] Phone: [phone]   Fax:  [phone]  Physical Therapy Treatment  Patient Details  Name: Caitlin [name] MRN: [id number] Date of Birth: 03-01-1946 Referring Provider: Stark Klein  Encounter Date: 10/27/2016      PT End of Session - 10/27/16 1119    Visit Number 5   Number of Visits 6   Date for PT Re-Evaluation 11/12/16   Authorization Type medicare    Authorization - Visit Number 5   Authorization - Number of Visits 6   PT Start Time 9179   PT Stop Time 1115   PT Time Calculation (min) 45 min   Activity Tolerance Patient tolerated treatment well   Behavior During Therapy [facility] for tasks assessed/performed      Past Medical History:  Diagnosis Date  . Asthma   . Breast cancer ([place])   . Breast disorder    cancer  . Breast mass    benign  . GERD (gastroesophageal reflux disease)   . History of hiatal hernia   . Hyperlipidemia   . Hypertension   . Impaired fasting glucose   . Rectocele     Past Surgical History:  Procedure Laterality Date  . BREAST LUMPECTOMY WITH RADIOACTIVE SEED AND SENTINEL LYMPH NODE BIOPSY Right 11/11/2015   Procedure: RIGHT BREAST RADIOACTIVE SEED GUIDED  LUMPECTOMY AND RADIOACTIVE SEED TARGETED SENTINEL LYMPH NODE BIOPSY;  Surgeon: Stark Klein, MD;  Location: [place];  Service: General;  Laterality: Right;  . BREAST SURGERY Left    breast biopies x4  . CHOLECYSTECTOMY  09/08/2010  . COLONOSCOPY    . COLONOSCOPY N/A 09/25/2013   Procedure: COLONOSCOPY;  Surgeon: Rogene Houston, MD;  [facility];  Service: Endoscopy;  Laterality: N/A;  100-moved to 1200 Ann to notify pt  . HERNIA REPAIR  12/24/2010, 12-2014  . LAPAROSCOPIC GASTRIC SLEEVE RESECTION  11-2013   at [place]  2007/2008  . TONSILLECTOMY  1968  . UPPER GASTROINTESTINAL ENDOSCOPY    . VAGINAL HYSTERECTOMY  1980s    There  were no vitals filed for this visit.      Subjective Assessment - 10/27/16 1115    Subjective Pt comes in wearing her compression bra.  Has been completing self manual techniques.   Pertinent History 10/27/2015 Rt breast bioplsy, 11/09/15 radiation, 11/11/2015 lumpectomy currently on hormonal therapy                          [facility] Adult PT Treatment/Exercise - 10/27/16 0001      Manual Therapy   Manual Lymphatic Drainage (MLD) Educated pt in self techniques as pt was not completing correctly.  This was done sitting.  Step 1; supraclavicular: 2 abdominal 3) Lt axillary nodes 4) Rt inguinal nodes 5) route fluid from RT to Lt axillary 6) route fluid Rt axillary to Rt inguinal 7) Chest 8) repeat 6 and 7; 9) abdominal 10) supraclavicular                 PT Education - 10/27/16 1119    Education provided Yes   Education Details softer touch think about stretching skin when doing self manual techniques.    Person(s) Educated Patient   Methods Explanation;Demonstration;Tactile cues;Verbal cues   Comprehension Returned demonstration;Verbalized understanding          PT Short Term Goals - 10/25/16 1622  PT SHORT TERM GOAL #1   Title Pt to notice a 50% reduction in induration of her Rt breast.     Time 10   Period Days   Status Achieved     PT SHORT TERM GOAL #2   Title Pt to be completing self manual techniques at least 3 times a week to assist in reduction of induration.   Time 10   Period Days   Status Achieved           PT Long Term Goals - 10/25/16 1622      PT LONG TERM GOAL #1   Title Pt to note a 75% reduction in the induration and swelling  of her Rt breast.    Time 3   Period Weeks   Status Partially Met     PT LONG TERM GOAL #2   Title Pt to feel confident in self manual techniques and be performing them at least five days a week to prevent any increase edema in rt breast.    Time 3   Period Weeks   Status Partially Met                Plan - 10/27/16 1120    Clinical Impression Statement Observed pt self manual techniques which needed several corrections.  Pt and therapist both feel it will be benefical for pt to practice at home and then come back for one more self instruction session.    Rehab Potential Good   PT Frequency 2x / week   PT Duration 3 weeks   PT Treatment/Interventions Patient/family education;Manual lymph drainage;Manual techniques   PT Next Visit Plan one more self instruction session then discharge to home care.       Patient will benefit from skilled therapeutic intervention in order to improve the following deficits and impairments:  Increased edema  Visit Diagnosis: Lymphedema, not elsewhere classified     Problem List Patient Active Problem List   Diagnosis Date Noted  . Chondromalacia patellae, left knee 05/26/2016  . Breast cancer of upper-inner quadrant of right female breast ([place]) 11/04/2015  . GERD (gastroesophageal reflux disease) 09/02/2013  . Obesity 09/02/2013  . Ventral hernia, recurrent 09/02/2013  . Unspecified asthma(493.90) 12/12/2012  . Cough 03/27/2012  . Right middle lobe syndrome 03/27/2012    Rayetta Humphrey, PT CLT [phone] 10/27/2016, 11:22 AM  [place], [place], [address] Phone: [phone]   Fax:  [phone]  Name: Caitlin [name] MRN: [id number] Date of Birth: 06-26-1946

## 2016-11-01 ENCOUNTER — Encounter (HOSPITAL_COMMUNITY): Payer: Medicare Other

## 2016-11-03 ENCOUNTER — Telehealth (HOSPITAL_COMMUNITY): Payer: Self-pay | Admitting: Family Medicine

## 2016-11-03 NOTE — Telephone Encounter (Signed)
11/03/16 pt called to cx because she is still out of town.  The appt was RS for 4/11

## 2016-11-04 ENCOUNTER — Ambulatory Visit (HOSPITAL_COMMUNITY): Payer: Medicare Other | Admitting: Physical Therapy

## 2016-11-07 ENCOUNTER — Other Ambulatory Visit: Payer: Self-pay | Admitting: Oncology

## 2016-11-07 DIAGNOSIS — Z853 Personal history of malignant neoplasm of breast: Secondary | ICD-10-CM

## 2016-11-08 DIAGNOSIS — M1712 Unilateral primary osteoarthritis, left knee: Secondary | ICD-10-CM | POA: Diagnosis not present

## 2016-11-08 DIAGNOSIS — M25562 Pain in left knee: Secondary | ICD-10-CM | POA: Diagnosis not present

## 2016-11-09 ENCOUNTER — Ambulatory Visit (HOSPITAL_COMMUNITY): Payer: Medicare Other | Attending: General Surgery | Admitting: Physical Therapy

## 2016-11-09 DIAGNOSIS — I890 Lymphedema, not elsewhere classified: Secondary | ICD-10-CM | POA: Insufficient documentation

## 2016-11-09 NOTE — Therapy (Signed)
[place], [place], [address] Phone: [phone]   Fax:  [phone]  Physical Therapy Treatment  Patient Details  Name: Caitlin [name] MRN: [id number] Date of Birth: [birth year] Referring Provider: Stark Klein  Encounter Date: 11/09/2016      PT End of Session - 11/09/16 0947    Visit Number 6   Number of Visits 6   Date for PT Re-Evaluation 11/12/16   Authorization Type medicare    Authorization - Visit Number 6   Authorization - Number of Visits 6   PT Start Time 0905   PT Stop Time 0944   PT Time Calculation (min) 39 min   Activity Tolerance Patient tolerated treatment well   Behavior During Therapy [facility] for tasks assessed/performed      Past Medical History:  Diagnosis Date  . Asthma   . Breast cancer ([place])   . Breast disorder    cancer  . Breast mass    benign  . GERD (gastroesophageal reflux disease)   . History of hiatal hernia   . Hyperlipidemia   . Hypertension   . Impaired fasting glucose   . Rectocele     Past Surgical History:  Procedure Laterality Date  . BREAST LUMPECTOMY WITH RADIOACTIVE SEED AND SENTINEL LYMPH NODE BIOPSY Right 11/11/2015   Procedure: RIGHT BREAST RADIOACTIVE SEED GUIDED  LUMPECTOMY AND RADIOACTIVE SEED TARGETED SENTINEL LYMPH NODE BIOPSY;  Surgeon: Stark Klein, MD;  Location: [facility];  Service: General;  Laterality: Right;  . BREAST SURGERY Left    breast biopies x4  . CHOLECYSTECTOMY  09/08/2010  . COLONOSCOPY    . COLONOSCOPY N/A 09/25/2013   Procedure: COLONOSCOPY;  Surgeon: Rogene Houston, MD;  [facility];  Service: Endoscopy;  Laterality: N/A;  100-moved to 1200 Ann to notify pt  . HERNIA REPAIR  12/24/2010, 12-2014  . LAPAROSCOPIC GASTRIC SLEEVE RESECTION  11-2013   at [place]  2007/2008  . TONSILLECTOMY  1968  . UPPER GASTROINTESTINAL ENDOSCOPY    . VAGINAL HYSTERECTOMY  1980s    There  were no vitals filed for this visit.      Subjective Assessment - 11/09/16 0907    Subjective Pt is doing her self massages about everyday.   Pertinent History 10/27/2015 Rt breast bioplsy, 11/09/15 radiation, 11/11/2015 lumpectomy currently on hormonal therapy    Currently in Pain? No/denies            [facility] PT Assessment - 11/09/16 0001      Assessment   Medical Diagnosis lymphedema   Referring Provider Stark Klein   Onset Date/Surgical Date 04/01/16   Next MD Visit 04/15/2016   Prior Therapy none     Precautions   Precautions None     Restrictions   Weight Bearing Restrictions No     Balance Screen   Has the patient fallen in the past 6 months No   Has the patient had a decrease in activity level because of a fear of falling?  No   Is the patient reluctant to leave their home because of a fear of falling?  No     Prior Function   Level of Independence Independent     Cognition   Overall Cognitive Status Within Functional Limits for tasks assessed     Observation/Other Assessments   Focus on Therapeutic Outcomes (FOTO)  --  Life impact score 22  LYMPHEDEMA/ONCOLOGY QUESTIONNAIRE - 11/09/16 0908      Type   Cancer Type Rt breast      Surgeries   Lumpectomy Date 11/09/15     Treatment   Past Radiation Treatment Yes   Current Hormone Treatment Yes   Date --  current      What other symptoms do you have   Are you Having Heaviness or Tightness Yes   Are you having Pain No   Are you having pitting edema Yes   Body Site right breast    Is it Hard or Difficult finding clothes that fit No   Do you have infections No   Is there Decreased scar mobility No     Lymphedema Stage   Stage STAGE 2 SPONTANEOUSLY IRREVERSIBLE     Lymphedema Assessments   Lymphedema Assessments Upper extremities     Right Upper Extremity Lymphedema   At Axilla  34.5 cm   15 cm Proximal to Olecranon Process 35 cm   10 cm Proximal to Olecranon Process 31.5 cm    Olecranon Process 26.7 cm   15 cm Proximal to Ulnar Styloid Process 23 cm   10 cm Proximal to Ulnar Styloid Process 19 cm   Just Proximal to Ulnar Styloid Process 15.8 cm   Across Hand at [employer] 19.3 cm   At [place] of 2nd Digit 6.3 cm   At [facility] of Thumb 6 cm     Left Upper Extremity Lymphedema   At Axilla  35.2 cm   15 cm Proximal to Olecranon Process 32.9 cm   10 cm Proximal to Olecranon Process 29.8 cm   Olecranon Process 25.7 cm   15 cm Proximal to Ulnar Styloid Process 21.9 cm   10 cm Proximal to Ulnar Styloid Process 18.2 cm   Just Proximal to Ulnar Styloid Process 16.2 cm   Across Hand at [employer] 19.2 cm   At [place] of 2nd Digit 6.3 cm   At [facility] of Thumb 6.3 cm                          PT Education - 11/09/16 0945    Education provided Yes   Education Details Reviewed and instructed pt on proper techniques for manual decongestive techniques for Rt breast lymphedema.  Pt was squeezing rather than gliding with techniques.  Worked on proper diaphragmic breathing.            PT Short Term Goals - 10/25/16 1622      PT SHORT TERM GOAL #1   Title Pt to notice a 50% reduction in induration of her Rt breast.     Time 10   Period Days   Status Achieved     PT SHORT TERM GOAL #2   Title Pt to be completing self manual techniques at least 3 times a week to assist in reduction of induration.   Time 10   Period Days   Status Achieved           PT Long Term Goals - 11/09/16 0949      PT LONG TERM GOAL #1   Title Pt to note a 75% reduction in the induration and swelling  of her Rt breast.    Time 3   Period Weeks   Status Achieved     PT LONG TERM GOAL #2   Title Pt to feel confident in self manual techniques and be performing them at  least five days a week to prevent any increase edema in rt breast.    Time 3   Period Weeks   Status Achieved               Plan - Nov 25, 2016 0947    Clinical Impression Statement Treatment  focused on therapist observing pt completing self decongestive techniques and therapist giving suggestions to improve self techniques.     Rehab Potential Good   PT Frequency 2x / week   PT Duration 3 weeks   PT Treatment/Interventions Patient/family education;Manual lymph drainage;Manual techniques   PT Next Visit Plan Pt I in self care ready for discharge at this time.       Patient will benefit from skilled therapeutic intervention in order to improve the following deficits and impairments:  Increased edema  Visit Diagnosis: Lymphedema, not elsewhere classified       G-Codes - 11/25/2016 0949    Functional Assessment Tool Used (Outpatient Only) induration and swelling of rt breast and UE    Functional Limitation Other PT primary   Other PT Primary Goal Status (W9927) At least 1 percent but less than 20 percent impaired, limited or restricted   Other PT Primary Discharge Status (S0044) At least 1 percent but less than 20 percent impaired, limited or restricted      Problem List Patient Active Problem List   Diagnosis Date Noted  . Chondromalacia patellae, left knee 05/26/2016  . Breast cancer of upper-inner quadrant of right female breast ([place]) 11/04/2015  . GERD (gastroesophageal reflux disease) 09/02/2013  . Obesity 09/02/2013  . Ventral hernia, recurrent 09/02/2013  . Unspecified asthma(493.90) 12/12/2012  . Cough 03/27/2012  . Right middle lobe syndrome 03/27/2012   Rayetta Humphrey, PT CLT [phone] 2016/11/25, 9:50 AM  [place], [place], [address] Phone: [phone]   Fax:  [phone]  Name: MADDIE BRAZIER MRN: [id number] Date of Birth: [birth year]  PHYSICAL THERAPY DISCHARGE SUMMARY  Visits from Start of Care: 6 Current functional level related to goals / functional outcomes: See above    Remaining deficits: See above   Education / Equipment: HEP Plan: Patient agrees to discharge.   Patient goals were met. Patient is being discharged due to meeting the stated rehab goals.  ?????        Rayetta Humphrey, [place] CLT [phone]

## 2016-11-15 ENCOUNTER — Encounter (HOSPITAL_COMMUNITY): Payer: Medicare Other

## 2016-11-15 ENCOUNTER — Ambulatory Visit (HOSPITAL_COMMUNITY)
Admission: RE | Admit: 2016-11-15 | Discharge: 2016-11-15 | Disposition: A | Discharge status: Home / Self Care | Payer: Medicare Other | Source: Ambulatory Visit | Attending: Oncology | Admitting: Oncology

## 2016-11-15 DIAGNOSIS — Z853 Personal history of malignant neoplasm of breast: Secondary | ICD-10-CM | POA: Diagnosis not present

## 2016-11-15 DIAGNOSIS — Z08 Encounter for follow-up examination after completed treatment for malignant neoplasm: Secondary | ICD-10-CM | POA: Diagnosis not present

## 2016-11-15 DIAGNOSIS — Z9889 Other specified postprocedural states: Secondary | ICD-10-CM | POA: Diagnosis not present

## 2016-11-15 DIAGNOSIS — R928 Other abnormal and inconclusive findings on diagnostic imaging of breast: Secondary | ICD-10-CM | POA: Diagnosis not present

## 2016-11-16 DIAGNOSIS — M25562 Pain in left knee: Secondary | ICD-10-CM | POA: Diagnosis not present

## 2016-11-16 DIAGNOSIS — M1712 Unilateral primary osteoarthritis, left knee: Secondary | ICD-10-CM | POA: Diagnosis not present

## 2016-11-23 DIAGNOSIS — M25562 Pain in left knee: Secondary | ICD-10-CM | POA: Diagnosis not present

## 2016-11-23 DIAGNOSIS — M1712 Unilateral primary osteoarthritis, left knee: Secondary | ICD-10-CM | POA: Diagnosis not present

## 2016-11-30 DIAGNOSIS — M1712 Unilateral primary osteoarthritis, left knee: Secondary | ICD-10-CM | POA: Diagnosis not present

## 2016-11-30 DIAGNOSIS — M25562 Pain in left knee: Secondary | ICD-10-CM | POA: Diagnosis not present

## 2016-12-07 ENCOUNTER — Ambulatory Visit (INDEPENDENT_AMBULATORY_CARE_PROVIDER_SITE_OTHER): Payer: Medicare Other

## 2016-12-07 ENCOUNTER — Ambulatory Visit (INDEPENDENT_AMBULATORY_CARE_PROVIDER_SITE_OTHER): Payer: Medicare Other | Admitting: Orthopaedic Surgery

## 2016-12-07 ENCOUNTER — Encounter (INDEPENDENT_AMBULATORY_CARE_PROVIDER_SITE_OTHER): Payer: Self-pay | Admitting: Orthopaedic Surgery

## 2016-12-07 VITALS — BP 129/81 | HR 75 | Resp 14 | Ht 63.0 in | Wt 197.0 lb

## 2016-12-07 DIAGNOSIS — M1712 Unilateral primary osteoarthritis, left knee: Secondary | ICD-10-CM

## 2016-12-07 NOTE — Progress Notes (Signed)
Office Visit Note   Patient: Caitlin [name]           Date of Birth: 1946-04-29           MRN: [id number] Visit Date: 12/07/2016              Requested by: Sharilyn Sites, [place] [place], [place] [address] PCP: Sharilyn Sites, MD   Assessment & Plan: Visit Diagnoses:  1. Unilateral primary osteoarthritis, left knee   Progressive osteoarthritis left knee predominantly in the lateral compartment  Plan: Long discussion regarding her present problem. I reviewed the x-rays with her and demonstrated progressive nature of the lateral compartment collapse. Mrs. Basara has had a course of Hyalgan via flexegenic without any significant relief. We had a long discussion regarding exercises wearing a brace and occasional cortisone injection. We've also discussed total knee replacement which in my opinion would be the definitive procedure. Total time spent over 30 minutes.  Follow-Up Instructions: Return if symptoms worsen or fail to improve.   Orders:  Orders Placed This Encounter  Procedures  . XR KNEE 3 VIEW LEFT   No orders of the defined types were placed in this encounter.     Procedures: No procedures performed   Clinical Data: No additional findings.   Subjective: Chief Complaint  Patient presents with  . Left Knee - Pain, Edema  Mrs. Gasaway returns for evaluation of problems she is having with her left knee. She is status post knee arthroscopy and chondroplasty in December 2017. She's had recurrent effusions and pain.  cortisone has given her some relief on a temporary basis. She recently was evaluated at [facility] in [place]. She received  5 Hyalgan injections without much relief. She's having soreness and achiness stiffness and recurrent effusion without fever or chills.  HPI  Review of Systems   Objective: Vital Signs: BP 129/81   Pulse 75   Resp 14   Ht 5\' 3"  (1.6 m)   Wt 197 lb (89.4 kg)   BMI 34.90 kg/m   Physical Exam  Ortho Exam left  knee with small effusion. Seems to have increased valgus with predominantly lateral compartment pain. Some patellar crepitation. Lacks about 5 to full knee extension and flexed about 105 or 6. No instability. Some fullness in the popliteal space. No calf pain. Neurovascular exam intact.  Specialty Comments:  No specialty comments available.  Imaging: Xr Knee 3 View Left  Result Date: 12/07/2016 As of the left knee replacement in 3 projections standing. There has been progressive collapse of the lateral compartment and the last 3 months. There is irregularity along the distal femoral surface without ectopic calcification. Valgus is also increased since the last evaluation. There is some mild decrease in the medial joint space and some osteophytes at the patellofemoral joint.     PMFS History: Patient Active Problem List   Diagnosis Date Noted  . Chondromalacia patellae, left knee 05/26/2016  . Breast cancer of upper-inner quadrant of right female breast ([place]) 11/04/2015  . GERD (gastroesophageal reflux disease) 09/02/2013  . Obesity 09/02/2013  . Ventral hernia, recurrent 09/02/2013  . Unspecified asthma(493.90) 12/12/2012  . Cough 03/27/2012  . Right middle lobe syndrome 03/27/2012   Past Medical History:  Diagnosis Date  . Asthma   . Breast cancer ([place])   . Breast disorder    cancer  . Breast mass    benign  . GERD (gastroesophageal reflux disease)   . History of hiatal hernia   . Hyperlipidemia   .  Hypertension   . Impaired fasting glucose   . Rectocele     Family History  Problem Relation Age of Onset  . Colon cancer Father   . Rectal cancer Father   . Prostate cancer Father   . Dementia Mother   . Osteoporosis Mother   . Healthy Daughter   . Healthy Son   . Pancreatic cancer Paternal Grandfather   . Heart disease Paternal Grandmother   . Heart disease Maternal Grandfather   . Breast cancer Maternal Grandmother   . Scoliosis Sister     Past Surgical History:    Procedure Laterality Date  . BREAST LUMPECTOMY WITH RADIOACTIVE SEED AND SENTINEL LYMPH NODE BIOPSY Right 11/11/2015   Procedure: RIGHT BREAST RADIOACTIVE SEED GUIDED  LUMPECTOMY AND RADIOACTIVE SEED TARGETED SENTINEL LYMPH NODE BIOPSY;  Surgeon: Stark Klein, MD;  Location: [facility];  Service: General;  Laterality: Right;  . BREAST SURGERY Left    breast biopies x4  . CHOLECYSTECTOMY  09/08/2010  . COLONOSCOPY    . COLONOSCOPY N/A 09/25/2013   Procedure: COLONOSCOPY;  Surgeon: Rogene Houston, MD;  [facility];  Service: Endoscopy;  Laterality: N/A;  100-moved to 1200 Ann to notify pt  . HERNIA REPAIR  12/24/2010, 12-2014  . LAPAROSCOPIC GASTRIC SLEEVE RESECTION  11-2013   at [place]  2007/2008  . TONSILLECTOMY  1968  . UPPER GASTROINTESTINAL ENDOSCOPY    . VAGINAL HYSTERECTOMY  1980s   Social History   Occupational History  . retired Retired    Pharmacist, hospital   Social History Main Topics  . Smoking status: Never Smoker  . Smokeless tobacco: Never Used  . Alcohol use Yes     Comment: occasional wine  . Drug use: No  . Sexual activity: Yes    Birth control/ protection: Surgical     Comment: hyst     [name], MD   Note - This record has been created using [employer].  Chart creation errors have been sought, but may not always  have been located. Such creation errors do not reflect on  the standard of medical care.

## 2016-12-13 DIAGNOSIS — M1712 Unilateral primary osteoarthritis, left knee: Secondary | ICD-10-CM | POA: Diagnosis not present

## 2016-12-13 DIAGNOSIS — M25562 Pain in left knee: Secondary | ICD-10-CM | POA: Diagnosis not present

## 2016-12-20 DIAGNOSIS — Z6834 Body mass index (BMI) 34.0-34.9, adult: Secondary | ICD-10-CM | POA: Diagnosis not present

## 2016-12-20 DIAGNOSIS — J45901 Unspecified asthma with (acute) exacerbation: Secondary | ICD-10-CM | POA: Diagnosis not present

## 2016-12-20 DIAGNOSIS — I10 Essential (primary) hypertension: Secondary | ICD-10-CM | POA: Diagnosis not present

## 2016-12-20 DIAGNOSIS — Z1389 Encounter for screening for other disorder: Secondary | ICD-10-CM | POA: Diagnosis not present

## 2016-12-20 DIAGNOSIS — R7309 Other abnormal glucose: Secondary | ICD-10-CM | POA: Diagnosis not present

## 2016-12-20 DIAGNOSIS — E782 Mixed hyperlipidemia: Secondary | ICD-10-CM | POA: Diagnosis not present

## 2016-12-20 DIAGNOSIS — J4540 Moderate persistent asthma, uncomplicated: Secondary | ICD-10-CM | POA: Diagnosis not present

## 2016-12-20 DIAGNOSIS — R945 Abnormal results of liver function studies: Secondary | ICD-10-CM | POA: Diagnosis not present

## 2016-12-21 ENCOUNTER — Encounter (INDEPENDENT_AMBULATORY_CARE_PROVIDER_SITE_OTHER): Payer: Self-pay | Admitting: Orthopaedic Surgery

## 2016-12-21 ENCOUNTER — Ambulatory Visit (INDEPENDENT_AMBULATORY_CARE_PROVIDER_SITE_OTHER): Payer: Medicare Other | Admitting: Orthopaedic Surgery

## 2016-12-21 VITALS — BP 109/66 | HR 102 | Ht 63.0 in | Wt 195.0 lb

## 2016-12-21 DIAGNOSIS — M1712 Unilateral primary osteoarthritis, left knee: Secondary | ICD-10-CM

## 2016-12-21 DIAGNOSIS — M2242 Chondromalacia patellae, left knee: Secondary | ICD-10-CM

## 2016-12-21 NOTE — Progress Notes (Signed)
Office Visit Note   Patient: Caitlin [name]           Date of Birth: Jan 10, 1946           MRN: [id number] Visit Date: 12/21/2016              Requested by: Sharilyn Sites, [address] [place], [place] [address] PCP: Sharilyn Sites, MD   Assessment & Plan: Visit Diagnoses:  1. Unilateral primary osteoarthritis, left knee   2. Chondromalacia patellae, left knee     Plan: Long discussion regarding the osteoarthritis of her left knee. This is [place] just started a tapering prednisone dose today for an upper respiratory problem I think that will make a big difference in her knee pain so I would wait at least a week before considering a cortisone injection in her left knee  Follow-Up Instructions: Return if symptoms worsen or fail to improve.   Orders:  No orders of the defined types were placed in this encounter.  No orders of the defined types were placed in this encounter.     Procedures: No procedures performed   Clinical Data: No additional findings.   Subjective: Chief Complaint  Patient presents with  . Left Knee - Pain    Requesting injection discuss further treatment last injection 09/28/16  Caitlin [name] started a tapering prednisone dosage and this morning for an upper respiratory problem. I think that will help her left knee osteoarthritis and symptoms I would wait a week before considering a cortisone injection if the prednisone  does not help.  HPI  Review of Systems   Objective: Vital Signs: BP 109/66   Pulse (!) 102   Ht 5\' 3"  (1.6 m)   Wt 195 lb (88.5 kg)   BMI 34.54 kg/m   Physical Exam  Ortho Exam small effusion left knee full extension about 100 of flexion no instability more medial and lateral joint pain. Some patellar crepitation. Neurovascular exam intact no calf pain  Specialty Comments:  No specialty comments available.  Imaging: No results found.   PMFS History: Patient Active Problem List   Diagnosis Date Noted  .  Unilateral primary osteoarthritis, left knee 12/21/2016  . Chondromalacia patellae, left knee 05/26/2016  . Breast cancer of upper-inner quadrant of right female breast ([place]) 11/04/2015  . GERD (gastroesophageal reflux disease) 09/02/2013  . Obesity 09/02/2013  . Ventral hernia, recurrent 09/02/2013  . Unspecified asthma(493.90) 12/12/2012  . Cough 03/27/2012  . Right middle lobe syndrome 03/27/2012   Past Medical History:  Diagnosis Date  . Asthma   . Breast cancer ([place])   . Breast disorder    cancer  . Breast mass    benign  . GERD (gastroesophageal reflux disease)   . History of hiatal hernia   . Hyperlipidemia   . Hypertension   . Impaired fasting glucose   . Rectocele     Family History  Problem Relation Age of Onset  . Colon cancer Father   . Rectal cancer Father   . Prostate cancer Father   . Dementia Mother   . Osteoporosis Mother   . Healthy Daughter   . Healthy Son   . Pancreatic cancer Paternal Grandfather   . Heart disease Paternal Grandmother   . Heart disease Maternal Grandfather   . Breast cancer Maternal Grandmother   . Scoliosis Sister     Past Surgical History:  Procedure Laterality Date  . BREAST LUMPECTOMY WITH RADIOACTIVE SEED AND SENTINEL LYMPH NODE BIOPSY Right 11/11/2015  Procedure: RIGHT BREAST RADIOACTIVE SEED GUIDED  LUMPECTOMY AND RADIOACTIVE SEED TARGETED SENTINEL LYMPH NODE BIOPSY;  Surgeon: Stark Klein, MD;  Location: [facility];  Service: General;  Laterality: Right;  . BREAST SURGERY Left    breast biopies x4  . CHOLECYSTECTOMY  09/08/2010  . COLONOSCOPY    . COLONOSCOPY N/A 09/25/2013   Procedure: COLONOSCOPY;  Surgeon: Rogene Houston, MD;  [facility];  Service: Endoscopy;  Laterality: N/A;  100-moved to 1200 Ann to notify pt  . HERNIA REPAIR  12/24/2010, 12-2014  . LAPAROSCOPIC GASTRIC SLEEVE RESECTION  11-2013   at [place]  2007/2008  . TONSILLECTOMY  1968  . UPPER  GASTROINTESTINAL ENDOSCOPY    . VAGINAL HYSTERECTOMY  1980s   Social History   Occupational History  . retired Retired    Pharmacist, hospital   Social History Main Topics  . Smoking status: Never Smoker  . Smokeless tobacco: Never Used  . Alcohol use Yes     Comment: occasional wine  . Drug use: No  . Sexual activity: Yes    Birth control/ protection: Surgical     Comment: hyst     [name], MD   Note - This record has been created using [employer].  Chart creation errors have been sought, but may not always  have been located. Such creation errors do not reflect on  the standard of medical care.

## 2016-12-28 ENCOUNTER — Ambulatory Visit (INDEPENDENT_AMBULATORY_CARE_PROVIDER_SITE_OTHER): Payer: Medicare Other | Admitting: Orthopaedic Surgery

## 2017-02-23 DIAGNOSIS — Z4889 Encounter for other specified surgical aftercare: Secondary | ICD-10-CM | POA: Diagnosis not present

## 2017-02-23 DIAGNOSIS — Z8719 Personal history of other diseases of the digestive system: Secondary | ICD-10-CM | POA: Diagnosis not present

## 2017-02-23 DIAGNOSIS — Z09 Encounter for follow-up examination after completed treatment for conditions other than malignant neoplasm: Secondary | ICD-10-CM | POA: Diagnosis not present

## 2017-02-24 ENCOUNTER — Institutional Professional Consult (permissible substitution): Payer: Medicare Other | Admitting: Internal Medicine

## 2017-03-01 ENCOUNTER — Ambulatory Visit (INDEPENDENT_AMBULATORY_CARE_PROVIDER_SITE_OTHER): Payer: Medicare Other | Admitting: Orthopaedic Surgery

## 2017-03-01 DIAGNOSIS — M1712 Unilateral primary osteoarthritis, left knee: Secondary | ICD-10-CM | POA: Diagnosis not present

## 2017-03-01 MED ORDER — BUPIVACAINE HCL 0.5 % IJ SOLN
3.0000 mL | INTRAMUSCULAR | Status: AC | PRN
  Administered 2017-03-01: 3 mL via INTRA_ARTICULAR

## 2017-03-01 MED ORDER — LIDOCAINE HCL 1 % IJ SOLN
5.0000 mL | INTRAMUSCULAR | Status: AC | PRN
  Administered 2017-03-01: 5 mL

## 2017-03-01 MED ORDER — METHYLPREDNISOLONE ACETATE 40 MG/ML IJ SUSP
80.0000 mg | INTRAMUSCULAR | Status: AC | PRN
  Administered 2017-03-01: 80 mg

## 2017-03-01 NOTE — Progress Notes (Signed)
Office Visit Note   Patient: Caitlin [name]           Date of Birth: [birth year]           MRN: [id number] Visit Date: 03/01/2017              Requested by: Sharilyn Sites, [address] [place], [place] [address] PCP: Sharilyn Sites, MD   Assessment & Plan: Visit Diagnoses:  1. Unilateral primary osteoarthritis, left knee     Plan: Aspirate left knee and inject with cortisone. Long discussion regarding different treatment options. Caitlin [name] would like to proceed with a knee replacement some time in October. We will fill out the forms including the clearance form and wait for her to call regarding scheduling. Long discussion over 30 minutes regarding the surgery what she can expect postoperatively.  Follow-Up Instructions: Return wants to schedule left TKR.   Orders:  No orders of the defined types were placed in this encounter.  No orders of the defined types were placed in this encounter.     Procedures: Large Joint Inj Date/Time: 03/01/2017 12:00 PM Performed by: [name] Authorized by: [name]   Consent Given by:  Patient Timeout: prior to procedure the correct patient, procedure, and site was verified   Indications:  Pain and joint swelling Location:  Knee Site:  L knee Prep: patient was prepped and draped in usual sterile fashion   Needle Size:  25 G Needle Length:  1.5 inches Approach:  Anteromedial Ultrasound Guidance: No   Fluoroscopic Guidance: No   Arthrogram: No   Medications:  5 mL lidocaine 1 %; 80 mg methylPREDNISolone acetate 40 MG/ML; 3 mL bupivacaine 0.5 % Aspiration Attempted: Yes   Aspirate amount (mL):  43 Aspirate:  Clear and yellow Patient tolerance:  Patient tolerated the procedure well with no immediate complications     Clinical Data: No additional findings.   Subjective: No chief complaint on file. Caitlin [name] is about 7 months status post left knee arthroscopy. She had considerable chondromalacia in all  3 compartments, but mostly in the lateral compartment where she also had a tear of the lateral meniscus. Over time she's had progressive collapse of the lateral compartment prior films that were performed in May. On several occasions I've injected cortisone with only temporary relief of her pain. She is having compromise of her daily activities and even sleep. She's had a course of [place] supplementation through [employer] without much relief. She has been wearing a brace that helps.  HPI  Review of Systems   Objective: Vital Signs: There were no vitals taken for this visit.  Physical Exam  Ortho Exam Left knee with positive effusion. Predominantly lateral joint pain. Positive patellar crepitation with some discomfort with compression. Increased valgus with weightbearing. No calf pain. Pulses intact. Neurovascular exam intact. Straight leg raise negative. No pain range of motion of either hip. No popliteal pain Specialty Comments:  No specialty comments available.  Imaging: No results found.   PMFS History: Patient Active Problem List   Diagnosis Date Noted  . Unilateral primary osteoarthritis, left knee 12/21/2016  . Chondromalacia patellae, left knee 05/26/2016  . Breast cancer of upper-inner quadrant of right female breast ([place]) 11/04/2015  . GERD (gastroesophageal reflux disease) 09/02/2013  . Obesity 09/02/2013  . Ventral hernia, recurrent 09/02/2013  . Unspecified asthma(493.90) 12/12/2012  . Cough 03/27/2012  . Right middle lobe syndrome 03/27/2012   Past Medical History:  Diagnosis Date  . Asthma   .  Breast cancer ([place])   . Breast disorder    cancer  . Breast mass    benign  . GERD (gastroesophageal reflux disease)   . History of hiatal hernia   . Hyperlipidemia   . Hypertension   . Impaired fasting glucose   . Rectocele     Family History  Problem Relation Age of Onset  . Colon cancer Father   . Rectal cancer Father   . Prostate cancer Father   .  Dementia Mother   . Osteoporosis Mother   . Healthy Daughter   . Healthy Son   . Pancreatic cancer Paternal Grandfather   . Heart disease Paternal Grandmother   . Heart disease Maternal Grandfather   . Breast cancer Maternal Grandmother   . Scoliosis Sister     Past Surgical History:  Procedure Laterality Date  . BREAST LUMPECTOMY WITH RADIOACTIVE SEED AND SENTINEL LYMPH NODE BIOPSY Right 11/11/2015   Procedure: RIGHT BREAST RADIOACTIVE SEED GUIDED  LUMPECTOMY AND RADIOACTIVE SEED TARGETED SENTINEL LYMPH NODE BIOPSY;  Surgeon: Stark Klein, MD;  Location: [facility];  Service: General;  Laterality: Right;  . BREAST SURGERY Left    breast biopies x4  . CHOLECYSTECTOMY  09/08/2010  . COLONOSCOPY    . COLONOSCOPY N/A 09/25/2013   Procedure: COLONOSCOPY;  Surgeon: Rogene Houston, MD;  [facility];  Service: Endoscopy;  Laterality: N/A;  100-moved to 1200 Ann to notify pt  . HERNIA REPAIR  12/24/2010, 12-2014  . LAPAROSCOPIC GASTRIC SLEEVE RESECTION  11-2013   at [place]  2007/2008  . TONSILLECTOMY  1968  . UPPER GASTROINTESTINAL ENDOSCOPY    . VAGINAL HYSTERECTOMY  1980s   Social History   Occupational History  . retired Retired    Pharmacist, hospital   Social History Main Topics  . Smoking status: Never Smoker  . Smokeless tobacco: Never Used  . Alcohol use Yes     Comment: occasional wine  . Drug use: No  . Sexual activity: Yes    Birth control/ protection: Surgical     Comment: hyst     [name], MD   Note - This record has been created using [employer].  Chart creation errors have been sought, but may not always  have been located. Such creation errors do not reflect on  the standard of medical care.

## 2017-03-13 DIAGNOSIS — E6609 Other obesity due to excess calories: Secondary | ICD-10-CM | POA: Diagnosis not present

## 2017-03-13 DIAGNOSIS — Z01810 Encounter for preprocedural cardiovascular examination: Secondary | ICD-10-CM | POA: Diagnosis not present

## 2017-03-13 DIAGNOSIS — Z1389 Encounter for screening for other disorder: Secondary | ICD-10-CM | POA: Diagnosis not present

## 2017-03-13 DIAGNOSIS — I10 Essential (primary) hypertension: Secondary | ICD-10-CM | POA: Diagnosis not present

## 2017-03-13 DIAGNOSIS — Z6834 Body mass index (BMI) 34.0-34.9, adult: Secondary | ICD-10-CM | POA: Diagnosis not present

## 2017-03-13 DIAGNOSIS — R7309 Other abnormal glucose: Secondary | ICD-10-CM | POA: Diagnosis not present

## 2017-03-13 DIAGNOSIS — E782 Mixed hyperlipidemia: Secondary | ICD-10-CM | POA: Diagnosis not present

## 2017-03-13 DIAGNOSIS — J4540 Moderate persistent asthma, uncomplicated: Secondary | ICD-10-CM | POA: Diagnosis not present

## 2017-03-13 DIAGNOSIS — Z9884 Bariatric surgery status: Secondary | ICD-10-CM | POA: Diagnosis not present

## 2017-03-14 ENCOUNTER — Telehealth (INDEPENDENT_AMBULATORY_CARE_PROVIDER_SITE_OTHER): Payer: Self-pay | Admitting: Orthopaedic Surgery

## 2017-03-14 ENCOUNTER — Other Ambulatory Visit (INDEPENDENT_AMBULATORY_CARE_PROVIDER_SITE_OTHER): Payer: Self-pay | Admitting: Orthopaedic Surgery

## 2017-03-14 ENCOUNTER — Encounter (INDEPENDENT_AMBULATORY_CARE_PROVIDER_SITE_OTHER): Payer: Self-pay | Admitting: Orthopaedic Surgery

## 2017-03-14 DIAGNOSIS — Z6834 Body mass index (BMI) 34.0-34.9, adult: Secondary | ICD-10-CM | POA: Diagnosis not present

## 2017-03-14 DIAGNOSIS — E6609 Other obesity due to excess calories: Secondary | ICD-10-CM | POA: Diagnosis not present

## 2017-03-14 DIAGNOSIS — Z0000 Encounter for general adult medical examination without abnormal findings: Secondary | ICD-10-CM | POA: Diagnosis not present

## 2017-03-14 NOTE — Telephone Encounter (Signed)
LVM with pt to please call to schedule surgery. Will try pt again at a later time. 

## 2017-03-20 ENCOUNTER — Ambulatory Visit (INDEPENDENT_AMBULATORY_CARE_PROVIDER_SITE_OTHER): Payer: Medicare Other | Admitting: Ophthalmology

## 2017-03-20 DIAGNOSIS — H43813 Vitreous degeneration, bilateral: Secondary | ICD-10-CM

## 2017-03-20 DIAGNOSIS — H35033 Hypertensive retinopathy, bilateral: Secondary | ICD-10-CM

## 2017-03-20 DIAGNOSIS — H2513 Age-related nuclear cataract, bilateral: Secondary | ICD-10-CM

## 2017-03-20 DIAGNOSIS — I10 Essential (primary) hypertension: Secondary | ICD-10-CM

## 2017-03-23 ENCOUNTER — Ambulatory Visit (INDEPENDENT_AMBULATORY_CARE_PROVIDER_SITE_OTHER): Payer: Medicare Other | Admitting: Ophthalmology

## 2017-03-24 DIAGNOSIS — N3020 Other chronic cystitis without hematuria: Secondary | ICD-10-CM | POA: Diagnosis not present

## 2017-03-27 DIAGNOSIS — I890 Lymphedema, not elsewhere classified: Secondary | ICD-10-CM | POA: Diagnosis not present

## 2017-03-27 DIAGNOSIS — C50211 Malignant neoplasm of upper-inner quadrant of right female breast: Secondary | ICD-10-CM | POA: Diagnosis not present

## 2017-04-04 ENCOUNTER — Telehealth (INDEPENDENT_AMBULATORY_CARE_PROVIDER_SITE_OTHER): Payer: Self-pay | Admitting: Orthopaedic Surgery

## 2017-04-04 NOTE — Telephone Encounter (Signed)
Would not enter knee joint with needle for at least 2 months prior to surgery Biggest concern with dental implant is infection. If implant would become infected surgery would have to be delayed for months.

## 2017-04-04 NOTE — Telephone Encounter (Signed)
Per patient she is going to hold off on the implant surgery for a couple of months after TKR. Patient wanted you to be aware.

## 2017-04-04 NOTE — Telephone Encounter (Signed)
Please advise 

## 2017-04-04 NOTE — Telephone Encounter (Signed)
Patient states she is scheduled for implant oral surgery on 04/17/17 and would like to know if that will affect her scheduled knee replacement in October. Patient would also like to know if her knee can be drained to relieve some pain since she knows she cannot get a cortisone injection since her surgery is scheduled. Please advise.

## 2017-04-04 NOTE — Telephone Encounter (Signed)
Spoke with patient.

## 2017-04-04 NOTE — Telephone Encounter (Signed)
thanks

## 2017-04-11 ENCOUNTER — Encounter (HOSPITAL_COMMUNITY): Payer: Medicare Other

## 2017-04-11 ENCOUNTER — Encounter (HOSPITAL_COMMUNITY): Payer: Medicare Other | Attending: Oncology | Admitting: Oncology

## 2017-04-11 ENCOUNTER — Encounter (HOSPITAL_COMMUNITY): Payer: Self-pay | Admitting: Oncology

## 2017-04-11 VITALS — BP 118/48 | HR 75 | Resp 16 | Ht 63.0 in | Wt 203.6 lb

## 2017-04-11 DIAGNOSIS — D7589 Other specified diseases of blood and blood-forming organs: Secondary | ICD-10-CM | POA: Diagnosis not present

## 2017-04-11 DIAGNOSIS — C50211 Malignant neoplasm of upper-inner quadrant of right female breast: Secondary | ICD-10-CM

## 2017-04-11 DIAGNOSIS — Z170 Estrogen receptor positive status [ER+]: Secondary | ICD-10-CM

## 2017-04-11 DIAGNOSIS — Z79811 Long term (current) use of aromatase inhibitors: Secondary | ICD-10-CM

## 2017-04-11 LAB — CBC WITH DIFFERENTIAL/PLATELET
BASOS ABS: 0 10*3/uL (ref 0.0–0.1)
BASOS PCT: 1 %
Eosinophils Absolute: 0.5 10*3/uL (ref 0.0–0.7)
Eosinophils Relative: 10 %
HEMATOCRIT: 35.9 % — AB (ref 36.0–46.0)
HEMOGLOBIN: 12.2 g/dL (ref 12.0–15.0)
Lymphocytes Relative: 32 %
Lymphs Abs: 1.6 10*3/uL (ref 0.7–4.0)
MCH: 33.5 pg (ref 26.0–34.0)
MCHC: 34 g/dL (ref 30.0–36.0)
MCV: 98.6 fL (ref 78.0–100.0)
MONOS PCT: 12 %
Monocytes Absolute: 0.6 10*3/uL (ref 0.1–1.0)
NEUTROS PCT: 45 %
Neutro Abs: 2.3 10*3/uL (ref 1.7–7.7)
Platelets: 268 10*3/uL (ref 150–400)
RBC: 3.64 MIL/uL — ABNORMAL LOW (ref 3.87–5.11)
RDW: 12.5 % (ref 11.5–15.5)
WBC: 5 10*3/uL (ref 4.0–10.5)

## 2017-04-11 LAB — COMPREHENSIVE METABOLIC PANEL
ALBUMIN: 3.8 g/dL (ref 3.5–5.0)
ALK PHOS: 95 U/L (ref 38–126)
ALT: 32 U/L (ref 14–54)
AST: 40 U/L (ref 15–41)
Anion gap: 9 (ref 5–15)
BILIRUBIN TOTAL: 0.5 mg/dL (ref 0.3–1.2)
BUN: 11 mg/dL (ref 6–20)
CALCIUM: 9.3 mg/dL (ref 8.9–10.3)
CO2: 26 mmol/L (ref 22–32)
Chloride: 97 mmol/L — ABNORMAL LOW (ref 101–111)
Creatinine, Ser: 0.64 mg/dL (ref 0.44–1.00)
GFR calc Af Amer: >60 mL/min (ref >60)
GFR calc non Af Amer: >60 mL/min (ref >60)
GLUCOSE: 95 mg/dL (ref 65–99)
Potassium: 4.3 mmol/L (ref 3.5–5.1)
Sodium: 132 mmol/L — ABNORMAL LOW (ref 135–145)
TOTAL PROTEIN: 6.6 g/dL (ref 6.5–8.1)

## 2017-04-11 NOTE — Progress Notes (Signed)
[facility]  Progress Note  Patient Care Team: Sharilyn Sites, MD as PCP - General  CHIEF COMPLAINTS/PURPOSE OF CONSULTATION:  pT1cN0M0 ER+ PR+ HER 2 neu - carcinoma of the R breast Lumpectomy/sentinel node biopsy with Dr. Barry Dienes on 11/11/2015    Breast cancer of upper-inner quadrant of right female breast ([place])   09/24/2014 Imaging    DEXA with normal bone density      10/06/2015 Imaging    Screening bilateral mammogram BI-RADS Category 0, R breast possible mass, L breast possible mass      10/20/2015 Imaging    Diagnostic B mammogram, R breast Ultrasound with suspicious mass in the R breast at the 1 o clock location 5 cm from the nipple, LN in the low R axillae with cortex nodularity      10/27/2015 Initial Biopsy    Ultrasound guided biopsy of R axillary LN, BENIGN. Ultrasound guided biopsy of R breast with invasive ductal carcinoma      11/04/2015 Initial Diagnosis    Breast cancer of upper-inner quadrant of right female breast ([place])      11/09/2015 Procedure    Radioactive seed localization R breast      11/11/2015 Surgery    R breast radioactive seed localized lumpectomy and seed targeted sentinel LN biopsy      11/11/2015 Pathology Results    Invasive ductal carcinoma Grade I/III spanning 1.1 cm, lobular neoplasia (LCIS) resection margins negative. 3 negative sentinel nodes ER (100%), PR (100%), Her2 neu negative pT1c, pN0      12/11/2015 Oncotype testing    Recurrence Score result 11, 10 year risk of distant recurrence with tamoxifen alone 8% (low-risk)      01/11/2016 - 02/04/2016 Radiation Therapy    Adjuvant breast radiation ([facility]): 42.72 Gy in 16 fractions to the right breast      01/2016 -  Anti-estrogen oral therapy    Anastrazole 1 mg daily. Planned duration of therapy: 5 years.        HISTORY OF PRESENTING ILLNESS:  Caitlin [name] 71 y.o. female is here because of Stage I infiltrating ductal carcinoma with associated LCIS,  ER+  PR+ and HER2-.   She has been doing well. She is tolerating Anastrozole well without any side effects. She denies any hot flashes, breast masses, or any other concerns. She states her left knee has been giving her a lot of pain and swelling and she's undergoing a knee replacement in October. Otherwise there is nothing new with her health.  MEDICAL HISTORY:  Past Medical History:  Diagnosis Date  . Asthma   . Breast cancer ([place])   . Breast disorder    cancer  . Breast mass    benign  . GERD (gastroesophageal reflux disease)   . History of hiatal hernia   . Hyperlipidemia   . Hypertension   . Impaired fasting glucose   . Rectocele     SURGICAL HISTORY: Past Surgical History:  Procedure Laterality Date  . BREAST LUMPECTOMY WITH RADIOACTIVE SEED AND SENTINEL LYMPH NODE BIOPSY Right 11/11/2015   Procedure: RIGHT BREAST RADIOACTIVE SEED GUIDED  LUMPECTOMY AND RADIOACTIVE SEED TARGETED SENTINEL LYMPH NODE BIOPSY;  Surgeon: Stark Klein, MD;  Location: [facility];  Service: General;  Laterality: Right;  . BREAST SURGERY Left    breast biopies x4  . CHOLECYSTECTOMY  09/08/2010  . COLONOSCOPY    . COLONOSCOPY N/A 09/25/2013   Procedure: COLONOSCOPY;  Surgeon: Rogene Houston, MD;  [facility];  Service: Endoscopy;  Laterality: N/A;  100-moved to 1200 Ann to notify pt  . HERNIA REPAIR  12/24/2010, 12-2014  . LAPAROSCOPIC GASTRIC SLEEVE RESECTION  11-2013   at [facility]  2007/2008  . TONSILLECTOMY  1968  . UPPER GASTROINTESTINAL ENDOSCOPY    . VAGINAL HYSTERECTOMY  1980s    SOCIAL HISTORY: Social History   Social History  . Marital status: Married    Spouse name: N/A  . Number of children: 2  . Years of education: N/A   Occupational History  . retired Retired    Pharmacist, hospital   Social History Main Topics  . Smoking status: Never Smoker  . Smokeless tobacco: Never Used  . Alcohol use Yes     Comment: occasional wine  .  Drug use: No  . Sexual activity: Yes    Birth control/ protection: Surgical     Comment: hyst   Other Topics Concern  . Not on file   Social History Narrative  . No narrative on file  Married for 44 years. 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  From [place], [place] Father was 65 when he passed had rectal/colon cancer when he was 69 years old. Died from congestive heart failure. Mother was 79 when she passed; had dementia 3 sisters-Older sister had health problems Maternal Grandmother had breast cancer. Natural Menopause during late 59's  FAMILY HISTORY: Family History  Problem Relation Age of Onset  . Colon cancer Father   . Rectal cancer Father   . Prostate cancer Father   . Dementia Mother   . Osteoporosis Mother   . Healthy Daughter   . Healthy Son   . Pancreatic cancer Paternal Grandfather   . Heart disease Paternal Grandmother   . Heart disease Maternal Grandfather   . Breast cancer Maternal Grandmother   . Scoliosis Sister     ALLERGIES:  is allergic to tetanus toxoids and tetanus toxoid adsorbed.  MEDICATIONS:  Current Outpatient Prescriptions  Medication Sig Dispense Refill  . anastrozole (ARIMIDEX) 1 MG tablet Take 1 tablet (1 mg total) by mouth daily. 90 tablet 2  . B Complex Vitamins (B-COMPLEX/B-12 SL) Place 1,000 mcg/day under the tongue daily.    . Calcium Citrate-Vitamin D (CALCIUM CITRATE CHEWY BITE) 500-500 MG-UNIT CHEW Chew 500 mg by mouth. One soft chews twice daily    . Calcium-Vitamin D-Iron (RA CALCIUM PLUS IRON/VIT D PO) Take 1 tablet by mouth at bedtime.    . celecoxib (CELEBREX) 200 MG capsule Take 200 mg by mouth 2 (two) times daily.    [name] docusate sodium (COLACE) 100 MG capsule Take 100 mg by mouth daily. Daily at bedtime    . esomeprazole (NEXIUM) 20 MG capsule Take 20 mg by mouth daily at  12 noon.    . famotidine (PEPCID) 20 MG tablet Take 20 mg by mouth daily.     . fluticasone-salmeterol (ADVAIR HFA) 115-21 MCG/ACT inhaler Inhale 2 puffs into the lungs as needed.     . magnesium oxide (MAG-OX) 400 MG tablet Take 400 mg by mouth daily.    . Melatonin 3 MG TABS Take 3 mg by mouth as needed.     . Menaquinone-7 (VITAMIN K2) 100 MCG CAPS Take 100 mcg/day by mouth daily after supper.    [name] MILK THISTLE EXTRACT  PO Take 1 tablet by mouth daily. '1000mg'$     . Multiple Vitamin (MULTIVITAMIN) capsule Take 2 capsules by mouth daily.     . pravastatin (PRAVACHOL) 20 MG tablet Take 20 mg by mouth daily.    [name] telmisartan-hydrochlorothiazide (MICARDIS HCT) 80-25 MG per tablet Take 0.5 tablets by mouth daily.      No current facility-administered medications for this visit.     Review of Systems  Constitutional: Negative.        No hot flashes No breast lumps  HENT: Negative.   Eyes: Negative.   Respiratory: Negative.   Cardiovascular: Negative.  Negative for leg swelling.  Gastrointestinal: Negative.   Genitourinary: Negative.   Musculoskeletal: Positive for joint pain (L knee).  Skin: Negative.   Neurological: Negative.   Endo/Heme/Allergies: Negative.   Psychiatric/Behavioral: Negative.   All other systems reviewed and are negative. 14 point ROS was done and is otherwise as detailed above or in HPI  PHYSICAL EXAMINATION: ECOG PERFORMANCE STATUS: 0 - Asymptomatic  Vitals:   04/11/17 1126  BP: (!) 118/48  Pulse: 75  Resp: 16  SpO2: 99%   Filed Weights   04/11/17 1126  Weight: 203 lb 9.6 oz (92.4 kg)   Physical Exam  Constitutional: She is oriented to person, place, and time and well-developed, well-nourished, and in no distress.  HENT:  Head: Normocephalic and atraumatic.  Nose: Nose normal.  Mouth/Throat: Oropharynx is clear and moist. No oropharyngeal exudate.  Eyes: Pupils are equal, round, and reactive to light. Conjunctivae and EOM are normal. Right eye  exhibits no discharge. Left eye exhibits no discharge. No scleral icterus.  Neck: Normal range of motion. Neck supple. No tracheal deviation present. No thyromegaly present.  Cardiovascular: Normal rate, regular rhythm and normal heart sounds.  Exam reveals no gallop and no friction rub.   No murmur heard. Pulmonary/Chest: Effort normal and breath sounds normal. She has no wheezes. She has no rales. Right breast exhibits no mass and no nipple discharge. Left breast exhibits no mass, no nipple discharge and no skin change.    Abdominal: Soft. Bowel sounds are normal. She exhibits no distension and no mass. There is no tenderness. There is no rebound and no guarding.  Musculoskeletal: Normal range of motion. She exhibits no edema.  Left knee and left ankle swelling  Lymphadenopathy:    She has no cervical adenopathy.  Neurological: She is alert and oriented to person, place, and time. She has normal reflexes. No cranial nerve deficit. Gait normal. Coordination normal.  Skin: Skin is warm and dry. No rash noted.  Psychiatric: Mood, memory, affect and judgment normal.  Nursing note and vitals reviewed.  LABORATORY DATA:  I have reviewed the data as listed Lab Results  Component Value Date   WBC 5.0 04/11/2017   HGB 12.2 04/11/2017   HCT 35.9 (L) 04/11/2017   MCV 98.6 04/11/2017   PLT 268 04/11/2017   CMP     Component Value Date/Time   NA 132 (L) 04/11/2017 1024   K 4.3 04/11/2017 1024   CL 97 (L) 04/11/2017 1024   CO2 26 04/11/2017 1024   GLUCOSE 95 04/11/2017 1024   BUN 11 04/11/2017 1024   CREATININE 0.64 04/11/2017 1024   CALCIUM 9.3 04/11/2017 1024   PROT 6.6 04/11/2017 1024   ALBUMIN 3.8 04/11/2017 1024   AST 40 04/11/2017 1024   ALT 32 04/11/2017 1024   ALKPHOS 95 04/11/2017 1024   BILITOT 0.5 04/11/2017 1024   GFRNONAA >60 04/11/2017  1024   GFRAA >60 04/11/2017 1024   RADIOGRAPHIC STUDIES: I have personally reviewed the radiological images as listed and agreed with  the findings in the report.  Diagnostic Mammogram 10/20/2015 IMPRESSION: 1. Suspicious mass in the right breast at the approximate 1 o'clock location.  2. Lymph node in the low right axilla with cortex nodularity.  PATHOLOGY:      ASSESSMENT & PLAN:  pT1cN0M0 ER+ PR+ HER 2 neu - carcinoma of the R breast, upper inner quadrant ER+ 100% PR + 100% HER 2 - Lumpectomy/sentinel node biopsy with Dr. Barry Dienes on 11/11/2015 Bone Density 09/2014 NORMAL Adjuvant XRT ONCOTYPE low risk Macrocytosis   Clinically NED on breast exam today. Reviewed her labs with her today. Continue anastrozole. It has been 2 years since her last DEXA scan. I will order one to be done prior to her next visit. Cont calcium-vitamin D. She will return for follow up in 6 months.   All questions were answered. The patient knows to call the clinic with any problems, questions or concerns.  Orders Placed This Encounter  Procedures  . DG Bone Density    Standing Status:   Future    Standing Expiration Date:   04/11/2018    Order Specific Question:   Reason for Exam (SYMPTOM  OR DIAGNOSIS REQUIRED)    Answer:   evaluate for osteopenia on AI    Order Specific Question:   Preferred imaging location?    Answer:   [facility]    I have reviewed the above documentation for accuracy and completeness, and I agree with the above.  This note was electronically signed.  Twana First, MD  04/11/2017 12:50 PM

## 2017-04-19 ENCOUNTER — Ambulatory Visit (INDEPENDENT_AMBULATORY_CARE_PROVIDER_SITE_OTHER): Payer: Medicare Other | Admitting: Orthopaedic Surgery

## 2017-04-19 ENCOUNTER — Encounter (INDEPENDENT_AMBULATORY_CARE_PROVIDER_SITE_OTHER): Payer: Self-pay | Admitting: Orthopaedic Surgery

## 2017-04-19 VITALS — BP 123/75 | HR 78 | Resp 14 | Ht 63.0 in | Wt 200.0 lb

## 2017-04-19 DIAGNOSIS — M1712 Unilateral primary osteoarthritis, left knee: Secondary | ICD-10-CM

## 2017-04-19 NOTE — Progress Notes (Signed)
Office Visit Note   Patient: Caitlin [name]           Date of Birth: 10-Oct-1945           MRN: [id number] Visit Date: 04/19/2017              Requested by: Sharilyn Sites, [address] [place], [place] [address] PCP: Sharilyn Sites, MD   Assessment & Plan: Visit Diagnoses:  1. Unilateral primary osteoarthritis, left knee     Plan: we'll schedule left total knee replacement. Long discussion regarding surgery, hospitalization, rehabilitation.  Follow-Up Instructions: Return scheddule left TKR.   Orders:  No orders of the defined types were placed in this encounter.  No orders of the defined types were placed in this encounter.     Procedures: No procedures performed   Clinical Data: No additional findings.   Subjective: Chief Complaint  Patient presents with  . Left Knee - Pain    Mrs. Mcdougald is a 4 y o that presents with left knee pain. She relates her surgery moved up to 05/02/17. She would like to get the left knee aspirated b/c she has a wedding this weekend.  knee replacement surgery is now scheduled for October 2. Aspiration would preclude surgery for at least 2 months. Patient aware. We will proceed without aspiration or cortisone injection.  HPI  Review of Systems  Constitutional: Negative for chills, fatigue and fever.  Eyes: Negative for itching.  Respiratory: Negative for chest tightness and shortness of breath.   Cardiovascular: Negative for chest pain, palpitations and leg swelling.  Gastrointestinal: Negative for blood in stool, constipation and diarrhea.  Endocrine: Negative for polyuria.  Genitourinary: Negative for dysuria.  Musculoskeletal: Negative for back pain, joint swelling, neck pain and neck stiffness.  Allergic/Immunologic: Negative for immunocompromised state.  Neurological: Negative for dizziness and numbness.  Hematological: Does not bruise/bleed easily.  Psychiatric/Behavioral: The patient is not nervous/anxious.       Objective: Vital Signs: BP 123/75   Pulse 78   Resp 14   Ht 5\' 3"  (1.6 m)   Wt 200 lb (90.7 kg)   BMI 35.43 kg/m   Physical Exam  Ortho Examleft knee with small effusion. Lacks full extension by about 3-4. Flexes 100. No instability. Positive medial and lateral joint line pain. Mild patella crepitation. No calf pain. No swelling distally. Has superficial varicosities Prior laser surgery. No prior history of DVT  Specialty Comments:  No specialty comments available.  Imaging: No results found.   PMFS History: Patient Active Problem List   Diagnosis Date Noted  . Unilateral primary osteoarthritis, left knee 12/21/2016  . Chondromalacia patellae, left knee 05/26/2016  . Breast cancer of upper-inner quadrant of right female breast ([facility]) 11/04/2015  . GERD (gastroesophageal reflux disease) 09/02/2013  . Obesity 09/02/2013  . Ventral hernia, recurrent 09/02/2013  . Unspecified asthma(493.90) 12/12/2012  . Cough 03/27/2012  . Right middle lobe syndrome 03/27/2012   Past Medical History:  Diagnosis Date  . Asthma   . Breast cancer ([facility])   . Breast disorder    cancer  . Breast mass    benign  . GERD (gastroesophageal reflux disease)   . History of hiatal hernia   . Hyperlipidemia   . Hypertension   . Impaired fasting glucose   . Rectocele     Family History  Problem Relation Age of Onset  . Colon cancer Father   . Rectal cancer Father   . Prostate cancer Father   .  Dementia Mother   . Osteoporosis Mother   . Healthy Daughter   . Healthy Son   . Pancreatic cancer Paternal Grandfather   . Heart disease Paternal Grandmother   . Heart disease Maternal Grandfather   . Breast cancer Maternal Grandmother   . Scoliosis Sister     Past Surgical History:  Procedure Laterality Date  . BREAST LUMPECTOMY WITH RADIOACTIVE SEED AND SENTINEL LYMPH NODE BIOPSY Right 11/11/2015   Procedure: RIGHT BREAST RADIOACTIVE SEED GUIDED  LUMPECTOMY AND RADIOACTIVE SEED TARGETED  SENTINEL LYMPH NODE BIOPSY;  Surgeon: Stark Klein, MD;  Location: [facility];  Service: General;  Laterality: Right;  . BREAST SURGERY Left    breast biopies x4  . CHOLECYSTECTOMY  09/08/2010  . COLONOSCOPY    . COLONOSCOPY N/A 09/25/2013   Procedure: COLONOSCOPY;  Surgeon: Rogene Houston, MD;  [facility];  Service: Endoscopy;  Laterality: N/A;  100-moved to 1200 Ann to notify pt  . HERNIA REPAIR  12/24/2010, 12-2014  . LAPAROSCOPIC GASTRIC SLEEVE RESECTION  11-2013   at [facility]  2007/2008  . TONSILLECTOMY  1968  . UPPER GASTROINTESTINAL ENDOSCOPY    . VAGINAL HYSTERECTOMY  1980s   Social History   Occupational History  . retired Retired    Pharmacist, hospital   Social History Main Topics  . Smoking status: Never Smoker  . Smokeless tobacco: Never Used  . Alcohol use Yes     Comment: occasional wine  . Drug use: No  . Sexual activity: Yes    Birth control/ protection: Surgical     Comment: hyst

## 2017-04-25 ENCOUNTER — Encounter (HOSPITAL_COMMUNITY): Payer: Self-pay

## 2017-04-25 ENCOUNTER — Ambulatory Visit (HOSPITAL_COMMUNITY)
Admission: RE | Admit: 2017-04-25 | Discharge: 2017-04-25 | Disposition: A | Discharge status: Home / Self Care | Payer: Medicare Other | Source: Ambulatory Visit | Attending: Orthopedic Surgery | Admitting: Orthopedic Surgery

## 2017-04-25 ENCOUNTER — Encounter (HOSPITAL_COMMUNITY)
Admission: RE | Admit: 2017-04-25 | Discharge: 2017-04-25 | Disposition: A | Discharge status: Home / Self Care | Payer: Medicare Other | Source: Ambulatory Visit | Attending: Orthopaedic Surgery | Admitting: Orthopaedic Surgery

## 2017-04-25 DIAGNOSIS — Z01810 Encounter for preprocedural cardiovascular examination: Secondary | ICD-10-CM | POA: Insufficient documentation

## 2017-04-25 DIAGNOSIS — Z01812 Encounter for preprocedural laboratory examination: Secondary | ICD-10-CM | POA: Insufficient documentation

## 2017-04-25 DIAGNOSIS — Z96652 Presence of left artificial knee joint: Secondary | ICD-10-CM | POA: Diagnosis not present

## 2017-04-25 DIAGNOSIS — Z01818 Encounter for other preprocedural examination: Secondary | ICD-10-CM

## 2017-04-25 HISTORY — DX: Asymptomatic varicose veins of unspecified lower extremity: I83.90

## 2017-04-25 HISTORY — DX: Cardiac murmur, unspecified: R01.1

## 2017-04-25 HISTORY — DX: Unspecified osteoarthritis, unspecified site: M19.90

## 2017-04-25 LAB — URINALYSIS, ROUTINE W REFLEX MICROSCOPIC
Bilirubin Urine: NEGATIVE
GLUCOSE, UA: NEGATIVE mg/dL
Hgb urine dipstick: NEGATIVE
KETONES UR: NEGATIVE mg/dL
LEUKOCYTES UA: NEGATIVE
Nitrite: NEGATIVE
PH: 7 (ref 5.0–8.0)
Protein, ur: NEGATIVE mg/dL
Specific Gravity, Urine: 1.008 (ref 1.005–1.030)

## 2017-04-25 LAB — CBC WITH DIFFERENTIAL/PLATELET
Basophils Absolute: 0 10*3/uL (ref 0.0–0.1)
Basophils Relative: 1 %
EOS PCT: 8 %
Eosinophils Absolute: 0.4 10*3/uL (ref 0.0–0.7)
HCT: 36.1 % (ref 36.0–46.0)
Hemoglobin: 12.2 g/dL (ref 12.0–15.0)
LYMPHS ABS: 1.4 10*3/uL (ref 0.7–4.0)
LYMPHS PCT: 29 %
MCH: 33.2 pg (ref 26.0–34.0)
MCHC: 33.8 g/dL (ref 30.0–36.0)
MCV: 98.1 fL (ref 78.0–100.0)
MONOS PCT: 9 %
Monocytes Absolute: 0.5 10*3/uL (ref 0.1–1.0)
Neutro Abs: 2.6 10*3/uL (ref 1.7–7.7)
Neutrophils Relative %: 53 %
PLATELETS: 237 10*3/uL (ref 150–400)
RBC: 3.68 MIL/uL — ABNORMAL LOW (ref 3.87–5.11)
RDW: 12.4 % (ref 11.5–15.5)
WBC: 4.9 10*3/uL (ref 4.0–10.5)

## 2017-04-25 LAB — TYPE AND SCREEN
ABO/RH(D): A POS
Antibody Screen: NEGATIVE

## 2017-04-25 LAB — COMPREHENSIVE METABOLIC PANEL
ALT: 29 U/L (ref 14–54)
AST: 32 U/L (ref 15–41)
Albumin: 3.9 g/dL (ref 3.5–5.0)
Alkaline Phosphatase: 95 U/L (ref 38–126)
Anion gap: 9 (ref 5–15)
BUN: 12 mg/dL (ref 6–20)
CHLORIDE: 103 mmol/L (ref 101–111)
CO2: 23 mmol/L (ref 22–32)
CREATININE: 0.65 mg/dL (ref 0.44–1.00)
Calcium: 9.8 mg/dL (ref 8.9–10.3)
Glucose, Bld: 123 mg/dL — ABNORMAL HIGH (ref 65–99)
POTASSIUM: 3.9 mmol/L (ref 3.5–5.1)
Sodium: 135 mmol/L (ref 135–145)
Total Bilirubin: 0.7 mg/dL (ref 0.3–1.2)
Total Protein: 6.5 g/dL (ref 6.5–8.1)

## 2017-04-25 LAB — ABO/RH: ABO/RH(D): A POS

## 2017-04-25 LAB — APTT: aPTT: 30 seconds (ref 24–36)

## 2017-04-25 LAB — PROTIME-INR
INR: 1.02
PROTHROMBIN TIME: 13.4 s (ref 11.4–15.2)

## 2017-04-25 LAB — SURGICAL PCR SCREEN
MRSA, PCR: POSITIVE — AB
Staphylococcus aureus: POSITIVE — AB

## 2017-04-25 NOTE — Pre-Procedure Instructions (Signed)
Caitlin [name]  04/25/2017      [facility] - [place], [place] - [address] AT [address] [place] [address] Phone: [phone] Fax: [place], [name] [address] [place] [address] Phone: [phone] Fax: [phone]    Your procedure is scheduled on   Tuesday  05/02/17  Report to [facility] Admitting at 530 A.M.  Call this number if you have problems the morning of surgery:  [phone]   Remember:  Do not eat food or drink liquids after midnight.  Take these medicines the morning of surgery with A SIP OF WATER   ANASTROZOLE, NEXIUM, INHALER IF NEEDED  7 days prior to surgery STOP taking any Aspirin, Aleve, Naproxen, Ibuprofen, Motrin, Advil, Goody's, BC's, all herbal medications, fish oil, and all vitamins   Do not wear jewelry, make-up or nail polish.  Do not wear lotions, powders, or perfumes, or deoderant.  Do not shave 48 hours prior to surgery.  Men may shave face and neck.  Do not bring valuables to the hospital.  [facility] is not responsible for any belongings or valuables.  Contacts, dentures or bridgework may not be worn into surgery.  Leave your suitcase in the car.  After surgery it may be brought to your room.  For patients admitted to the hospital, discharge time will be determined by your treatment team.  Patients discharged the day of surgery will not be allowed to drive home.   Name and phone number of your driver:    Special instructions:  Caitlin [name] - Preparing for Surgery  Before surgery, you can play an important role.  Because skin is not sterile, your skin needs to be as free of germs as possible.  You can reduce the number of germs on you skin by washing with CHG (chlorahexidine gluconate) soap before surgery.  CHG is an antiseptic cleaner which kills germs and bonds with the skin to continue  killing germs even after washing.  Please DO NOT use if you have an allergy to CHG or antibacterial soaps.  If your skin becomes reddened/irritated stop using the CHG and inform your nurse when you arrive at Short Stay.  Do not shave (including legs and underarms) for at least 48 hours prior to the first CHG shower.  You may shave your face.  Please follow these instructions carefully:   1.  Shower with CHG Soap the night before surgery and the                                morning of Surgery.  2.  If you choose to wash your hair, wash your hair first as usual with your       normal shampoo.  3.  After you shampoo, rinse your hair and body thoroughly to remove the                      Shampoo.  4.  Use CHG as you would any other liquid soap.  You can apply chg directly       to the skin and wash gently with scrungie or a clean washcloth.  5.  Apply the CHG Soap to your body ONLY FROM THE NECK DOWN.        Do  not use on open wounds or open sores.  Avoid contact with your eyes,       ears, mouth and genitals (private parts).  Wash genitals (private parts)       with your normal soap.  6.  Wash thoroughly, paying special attention to the area where your surgery        will be performed.  7.  Thoroughly rinse your body with warm water from the neck down.  8.  DO NOT shower/wash with your normal soap after using and rinsing off       the CHG Soap.  9.  Pat yourself dry with a clean towel.            10.  Wear clean pajamas.            11.  Place clean sheets on your bed the night of your first shower and do not        sleep with pets.  Day of Surgery  Do not apply any lotions/deoderants the morning of surgery.  Please wear clean clothes to the hospital/surgery center.    Please read over the following fact sheets that you were given. MRSA Information and Surgical Site Infection Prevention

## 2017-04-26 LAB — URINE CULTURE

## 2017-05-01 MED ORDER — ACETAMINOPHEN 10 MG/ML IV SOLN
1000.0000 mg | INTRAVENOUS | Status: AC
  Administered 2017-05-02: 1000 mg via INTRAVENOUS
  Filled 2017-05-01: qty 100

## 2017-05-01 MED ORDER — SODIUM CHLORIDE 0.9 % IV SOLN: INTRAVENOUS | Status: DC

## 2017-05-01 MED ORDER — TRANEXAMIC ACID 1000 MG/10ML IV SOLN
2000.0000 mg | INTRAVENOUS | Status: AC
  Administered 2017-05-02: 2000 mg via TOPICAL
  Filled 2017-05-01: qty 20

## 2017-05-02 ENCOUNTER — Encounter (HOSPITAL_COMMUNITY)
Admission: RE | Disposition: A | Discharge status: Home / Self Care | Payer: Self-pay | Source: Ambulatory Visit | Attending: Orthopaedic Surgery

## 2017-05-02 ENCOUNTER — Inpatient Hospital Stay (HOSPITAL_COMMUNITY): Payer: Medicare Other | Admitting: Certified Registered Nurse Anesthetist

## 2017-05-02 ENCOUNTER — Encounter (HOSPITAL_COMMUNITY): Payer: Self-pay

## 2017-05-02 ENCOUNTER — Inpatient Hospital Stay (HOSPITAL_COMMUNITY)
Admission: RE | Admit: 2017-05-02 | Discharge: 2017-05-04 | DRG: 470 | Disposition: A | Discharge status: Home / Self Care | Payer: Medicare Other | Source: Ambulatory Visit | Attending: Orthopaedic Surgery | Admitting: Orthopaedic Surgery

## 2017-05-02 DIAGNOSIS — G8918 Other acute postprocedural pain: Secondary | ICD-10-CM | POA: Diagnosis not present

## 2017-05-02 DIAGNOSIS — J45909 Unspecified asthma, uncomplicated: Secondary | ICD-10-CM | POA: Diagnosis present

## 2017-05-02 DIAGNOSIS — K219 Gastro-esophageal reflux disease without esophagitis: Secondary | ICD-10-CM | POA: Diagnosis present

## 2017-05-02 DIAGNOSIS — K449 Diaphragmatic hernia without obstruction or gangrene: Secondary | ICD-10-CM | POA: Diagnosis present

## 2017-05-02 DIAGNOSIS — Z8249 Family history of ischemic heart disease and other diseases of the circulatory system: Secondary | ICD-10-CM

## 2017-05-02 DIAGNOSIS — Z23 Encounter for immunization: Secondary | ICD-10-CM | POA: Diagnosis not present

## 2017-05-02 DIAGNOSIS — D62 Acute posthemorrhagic anemia: Secondary | ICD-10-CM | POA: Diagnosis not present

## 2017-05-02 DIAGNOSIS — M94262 Chondromalacia, left knee: Secondary | ICD-10-CM | POA: Diagnosis not present

## 2017-05-02 DIAGNOSIS — Z853 Personal history of malignant neoplasm of breast: Secondary | ICD-10-CM | POA: Diagnosis not present

## 2017-05-02 DIAGNOSIS — R05 Cough: Secondary | ICD-10-CM | POA: Diagnosis not present

## 2017-05-02 DIAGNOSIS — M1712 Unilateral primary osteoarthritis, left knee: Secondary | ICD-10-CM | POA: Diagnosis present

## 2017-05-02 DIAGNOSIS — Z887 Allergy status to serum and vaccine status: Secondary | ICD-10-CM | POA: Diagnosis not present

## 2017-05-02 DIAGNOSIS — Z96652 Presence of left artificial knee joint: Secondary | ICD-10-CM

## 2017-05-02 DIAGNOSIS — I10 Essential (primary) hypertension: Secondary | ICD-10-CM | POA: Diagnosis present

## 2017-05-02 DIAGNOSIS — Z90710 Acquired absence of both cervix and uterus: Secondary | ICD-10-CM | POA: Diagnosis not present

## 2017-05-02 DIAGNOSIS — M6588 Other synovitis and tenosynovitis, other site: Secondary | ICD-10-CM | POA: Diagnosis not present

## 2017-05-02 DIAGNOSIS — I739 Peripheral vascular disease, unspecified: Secondary | ICD-10-CM | POA: Diagnosis present

## 2017-05-02 HISTORY — PX: TOTAL KNEE ARTHROPLASTY: SHX125

## 2017-05-02 SURGERY — ARTHROPLASTY, KNEE, TOTAL
Anesthesia: Spinal | Site: Knee | Laterality: Left

## 2017-05-02 MED ORDER — DOCUSATE SODIUM 100 MG PO CAPS
100.0000 mg | ORAL_CAPSULE | Freq: Two times a day (BID) | ORAL | Status: DC
  Administered 2017-05-03 – 2017-05-04 (×3): 100 mg via ORAL
  Filled 2017-05-02 (×4): qty 1

## 2017-05-02 MED ORDER — OXYCODONE HCL 5 MG PO TABS
ORAL_TABLET | ORAL | Status: AC
  Administered 2017-05-02: 5 mg via ORAL
  Filled 2017-05-02: qty 1

## 2017-05-02 MED ORDER — TELMISARTAN-HCTZ 80-25 MG PO TABS: 0.5000 | ORAL_TABLET | Freq: Every day | ORAL | Status: DC

## 2017-05-02 MED ORDER — ALUM & MAG HYDROXIDE-SIMETH 200-200-20 MG/5ML PO SUSP
30.0000 mL | ORAL | Status: DC | PRN
  Administered 2017-05-02: 30 mL via ORAL
  Filled 2017-05-02: qty 30

## 2017-05-02 MED ORDER — BUPIVACAINE-EPINEPHRINE (PF) 0.25% -1:200000 IJ SOLN
INTRAMUSCULAR | Status: AC
  Filled 2017-05-02: qty 30

## 2017-05-02 MED ORDER — B-COMPLEX/B-12 SL LIQD: Freq: Every day | SUBLINGUAL | Status: DC

## 2017-05-02 MED ORDER — DOCUSATE SODIUM 100 MG PO CAPS
100.0000 mg | ORAL_CAPSULE | Freq: Every day | ORAL | Status: DC
  Administered 2017-05-02: 100 mg via ORAL
  Filled 2017-05-02 (×2): qty 1

## 2017-05-02 MED ORDER — ONDANSETRON HCL 4 MG/2ML IJ SOLN: 4.0000 mg | Freq: Four times a day (QID) | INTRAMUSCULAR | Status: DC | PRN

## 2017-05-02 MED ORDER — OXYCODONE HCL 5 MG/5ML PO SOLN: 5.0000 mg | Freq: Once | ORAL | Status: AC | PRN

## 2017-05-02 MED ORDER — HYDROMORPHONE HCL 1 MG/ML IJ SOLN
INTRAMUSCULAR | Status: AC
  Filled 2017-05-02: qty 1

## 2017-05-02 MED ORDER — MIDAZOLAM HCL 2 MG/2ML IJ SOLN
INTRAMUSCULAR | Status: AC
  Filled 2017-05-02: qty 2

## 2017-05-02 MED ORDER — SODIUM CHLORIDE 0.9 % IV SOLN: INTRAVENOUS | Status: DC

## 2017-05-02 MED ORDER — MIDAZOLAM HCL 2 MG/2ML IJ SOLN
INTRAMUSCULAR | Status: DC | PRN
  Administered 2017-05-02: 2 mg via INTRAVENOUS

## 2017-05-02 MED ORDER — INFLUENZA VAC SPLIT HIGH-DOSE 0.5 ML IM SUSY
0.5000 mL | PREFILLED_SYRINGE | INTRAMUSCULAR | Status: AC
  Administered 2017-05-04: 0.5 mL via INTRAMUSCULAR
  Filled 2017-05-02: qty 0.5

## 2017-05-02 MED ORDER — METHOCARBAMOL 500 MG PO TABS
500.0000 mg | ORAL_TABLET | Freq: Four times a day (QID) | ORAL | Status: DC | PRN
  Administered 2017-05-02 – 2017-05-04 (×6): 500 mg via ORAL
  Filled 2017-05-02 (×7): qty 1

## 2017-05-02 MED ORDER — FENTANYL CITRATE (PF) 100 MCG/2ML IJ SOLN
INTRAMUSCULAR | Status: DC | PRN
  Administered 2017-05-02 (×2): 50 ug via INTRAVENOUS

## 2017-05-02 MED ORDER — PANTOPRAZOLE SODIUM 40 MG PO TBEC
40.0000 mg | DELAYED_RELEASE_TABLET | Freq: Every day | ORAL | Status: DC
  Administered 2017-05-03 – 2017-05-04 (×2): 40 mg via ORAL
  Filled 2017-05-02 (×2): qty 1

## 2017-05-02 MED ORDER — OXYCODONE HCL 5 MG PO TABS
5.0000 mg | ORAL_TABLET | Freq: Once | ORAL | Status: AC | PRN
  Administered 2017-05-02: 5 mg via ORAL

## 2017-05-02 MED ORDER — PRAVASTATIN SODIUM 20 MG PO TABS
20.0000 mg | ORAL_TABLET | Freq: Every day | ORAL | Status: DC
  Administered 2017-05-02 – 2017-05-03 (×2): 20 mg via ORAL
  Filled 2017-05-02 (×2): qty 1

## 2017-05-02 MED ORDER — MAGNESIUM OXIDE 400 (241.3 MG) MG PO TABS
200.0000 mg | ORAL_TABLET | Freq: Every day | ORAL | Status: DC
  Administered 2017-05-03 – 2017-05-04 (×2): 200 mg via ORAL
  Filled 2017-05-02 (×2): qty 1

## 2017-05-02 MED ORDER — BUPIVACAINE IN DEXTROSE 0.75-8.25 % IT SOLN
INTRATHECAL | Status: DC | PRN
  Administered 2017-05-02: 1.6 mL via INTRATHECAL

## 2017-05-02 MED ORDER — HYDROMORPHONE HCL 1 MG/ML IJ SOLN
0.2500 mg | INTRAMUSCULAR | Status: DC | PRN
  Administered 2017-05-02: 0.5 mg via INTRAVENOUS
  Administered 2017-05-02: 1 mg via INTRAVENOUS
  Administered 2017-05-02: 0.5 mg via INTRAVENOUS

## 2017-05-02 MED ORDER — PROPOFOL 1000 MG/100ML IV EMUL
INTRAVENOUS | Status: AC
  Filled 2017-05-02: qty 400

## 2017-05-02 MED ORDER — OXYCODONE HCL 5 MG PO TABS
5.0000 mg | ORAL_TABLET | ORAL | Status: DC | PRN
  Administered 2017-05-02: 10 mg via ORAL
  Administered 2017-05-02: 5 mg via ORAL
  Administered 2017-05-02 – 2017-05-04 (×12): 10 mg via ORAL
  Filled 2017-05-02 (×12): qty 2

## 2017-05-02 MED ORDER — ONDANSETRON HCL 4 MG PO TABS
4.0000 mg | ORAL_TABLET | Freq: Four times a day (QID) | ORAL | Status: DC | PRN
  Administered 2017-05-02: 4 mg via ORAL
  Filled 2017-05-02: qty 1

## 2017-05-02 MED ORDER — CEFAZOLIN SODIUM-DEXTROSE 2-4 GM/100ML-% IV SOLN
2.0000 g | INTRAVENOUS | Status: AC
  Administered 2017-05-02: 2 g via INTRAVENOUS
  Filled 2017-05-02: qty 100

## 2017-05-02 MED ORDER — LACTATED RINGERS IV SOLN
INTRAVENOUS | Status: DC | PRN
  Administered 2017-05-02 (×2): via INTRAVENOUS

## 2017-05-02 MED ORDER — METOCLOPRAMIDE HCL 5 MG PO TABS: 5.0000 mg | ORAL_TABLET | Freq: Three times a day (TID) | ORAL | Status: DC | PRN

## 2017-05-02 MED ORDER — DIPHENHYDRAMINE HCL 12.5 MG/5ML PO ELIX: 12.5000 mg | ORAL_SOLUTION | ORAL | Status: DC | PRN

## 2017-05-02 MED ORDER — OXYCODONE HCL 5 MG PO TABS
ORAL_TABLET | ORAL | Status: AC
  Filled 2017-05-02: qty 1

## 2017-05-02 MED ORDER — PROPOFOL 10 MG/ML IV BOLUS
INTRAVENOUS | Status: DC | PRN
  Administered 2017-05-02: 20 mg via INTRAVENOUS
  Administered 2017-05-02: 40 mg via INTRAVENOUS

## 2017-05-02 MED ORDER — FAMOTIDINE 20 MG PO TABS
20.0000 mg | ORAL_TABLET | Freq: Every day | ORAL | Status: DC
  Administered 2017-05-02 – 2017-05-03 (×2): 20 mg via ORAL
  Filled 2017-05-02 (×2): qty 1

## 2017-05-02 MED ORDER — 0.9 % SODIUM CHLORIDE (POUR BTL) OPTIME
TOPICAL | Status: DC | PRN
  Administered 2017-05-02: 1000 mL

## 2017-05-02 MED ORDER — IRBESARTAN 300 MG PO TABS
300.0000 mg | ORAL_TABLET | Freq: Every day | ORAL | Status: DC
  Administered 2017-05-03 – 2017-05-04 (×2): 300 mg via ORAL
  Filled 2017-05-02 (×2): qty 1

## 2017-05-02 MED ORDER — PHENYLEPHRINE HCL 10 MG/ML IJ SOLN
INTRAMUSCULAR | Status: DC | PRN
  Administered 2017-05-02 (×2): 80 ug via INTRAVENOUS

## 2017-05-02 MED ORDER — FENTANYL CITRATE (PF) 250 MCG/5ML IJ SOLN
INTRAMUSCULAR | Status: AC
  Filled 2017-05-02: qty 5

## 2017-05-02 MED ORDER — SODIUM CHLORIDE 0.9 % IR SOLN
Status: DC | PRN
  Administered 2017-05-02: 3000 mL

## 2017-05-02 MED ORDER — DEXTROSE 5 % IV SOLN
INTRAVENOUS | Status: DC | PRN
  Administered 2017-05-02: 20 ug/min via INTRAVENOUS

## 2017-05-02 MED ORDER — HYDROCHLOROTHIAZIDE 12.5 MG PO CAPS
12.5000 mg | ORAL_CAPSULE | Freq: Every day | ORAL | Status: DC
  Administered 2017-05-03 – 2017-05-04 (×2): 12.5 mg via ORAL
  Filled 2017-05-02 (×2): qty 1

## 2017-05-02 MED ORDER — B COMPLEX-C PO TABS
1.0000 | ORAL_TABLET | Freq: Every day | ORAL | Status: DC
  Administered 2017-05-03 – 2017-05-04 (×2): 1 via ORAL
  Filled 2017-05-02 (×2): qty 1

## 2017-05-02 MED ORDER — METOCLOPRAMIDE HCL 5 MG/ML IJ SOLN: 5.0000 mg | Freq: Three times a day (TID) | INTRAMUSCULAR | Status: DC | PRN

## 2017-05-02 MED ORDER — CHLORHEXIDINE GLUCONATE 4 % EX LIQD: 60.0000 mL | Freq: Once | CUTANEOUS | Status: DC

## 2017-05-02 MED ORDER — CHLORHEXIDINE GLUCONATE CLOTH 2 % EX PADS
6.0000 | MEDICATED_PAD | Freq: Every day | CUTANEOUS | Status: DC
  Administered 2017-05-03: 6 via TOPICAL

## 2017-05-02 MED ORDER — KETOROLAC TROMETHAMINE 15 MG/ML IJ SOLN
INTRAMUSCULAR | Status: AC
  Filled 2017-05-02: qty 1

## 2017-05-02 MED ORDER — DEXTROSE 5 % IV SOLN
500.0000 mg | Freq: Four times a day (QID) | INTRAVENOUS | Status: DC | PRN
  Filled 2017-05-02: qty 5

## 2017-05-02 MED ORDER — ALBUMIN HUMAN 5 % IV SOLN
INTRAVENOUS | Status: AC
  Filled 2017-05-02: qty 250

## 2017-05-02 MED ORDER — PHENOL 1.4 % MT LIQD: 1.0000 | OROMUCOSAL | Status: DC | PRN

## 2017-05-02 MED ORDER — MOMETASONE FURO-FORMOTEROL FUM 200-5 MCG/ACT IN AERO
2.0000 | INHALATION_SPRAY | Freq: Two times a day (BID) | RESPIRATORY_TRACT | Status: DC
  Administered 2017-05-02 – 2017-05-04 (×3): 2 via RESPIRATORY_TRACT
  Filled 2017-05-02: qty 8.8

## 2017-05-02 MED ORDER — PROPOFOL 10 MG/ML IV BOLUS
INTRAVENOUS | Status: AC
  Filled 2017-05-02: qty 20

## 2017-05-02 MED ORDER — RIVAROXABAN 10 MG PO TABS
10.0000 mg | ORAL_TABLET | Freq: Every day | ORAL | Status: DC
  Administered 2017-05-03 – 2017-05-04 (×2): 10 mg via ORAL
  Filled 2017-05-02 (×2): qty 1

## 2017-05-02 MED ORDER — ANASTROZOLE 1 MG PO TABS
1.0000 mg | ORAL_TABLET | Freq: Every day | ORAL | Status: DC
  Administered 2017-05-03 – 2017-05-04 (×2): 1 mg via ORAL
  Filled 2017-05-02 (×2): qty 1

## 2017-05-02 MED ORDER — POLYETHYLENE GLYCOL 3350 17 G PO PACK: 17.0000 g | PACK | Freq: Every day | ORAL | Status: DC | PRN

## 2017-05-02 MED ORDER — ACETAMINOPHEN 10 MG/ML IV SOLN
1000.0000 mg | Freq: Four times a day (QID) | INTRAVENOUS | Status: AC
  Administered 2017-05-02 – 2017-05-03 (×4): 1000 mg via INTRAVENOUS
  Filled 2017-05-02 (×4): qty 100

## 2017-05-02 MED ORDER — PROPOFOL 500 MG/50ML IV EMUL
INTRAVENOUS | Status: DC | PRN
  Administered 2017-05-02: 100 ug/kg/min via INTRAVENOUS
  Administered 2017-05-02: 125 ug/kg/min via INTRAVENOUS

## 2017-05-02 MED ORDER — ROPIVACAINE HCL 5 MG/ML IJ SOLN
INTRAMUSCULAR | Status: DC | PRN
  Administered 2017-05-02: 20 mL via PERINEURAL

## 2017-05-02 MED ORDER — MUPIROCIN 2 % EX OINT
1.0000 "application " | TOPICAL_OINTMENT | Freq: Two times a day (BID) | CUTANEOUS | Status: DC
  Administered 2017-05-02 – 2017-05-04 (×4): 1 via NASAL
  Filled 2017-05-02 (×4): qty 22

## 2017-05-02 MED ORDER — HYDROMORPHONE HCL 1 MG/ML IJ SOLN
INTRAMUSCULAR | Status: AC
  Administered 2017-05-02: 0.5 mg via INTRAVENOUS
  Filled 2017-05-02: qty 1

## 2017-05-02 MED ORDER — ONDANSETRON HCL 4 MG/2ML IJ SOLN
INTRAMUSCULAR | Status: DC | PRN
  Administered 2017-05-02: 4 mg via INTRAVENOUS

## 2017-05-02 MED ORDER — MAGNESIUM CITRATE PO SOLN: 1.0000 | Freq: Once | ORAL | Status: DC | PRN

## 2017-05-02 MED ORDER — HYDROMORPHONE HCL 1 MG/ML IJ SOLN
0.5000 mg | INTRAMUSCULAR | Status: DC | PRN
  Administered 2017-05-03: 0.5 mg via INTRAVENOUS
  Filled 2017-05-02: qty 1

## 2017-05-02 MED ORDER — VANCOMYCIN HCL 1000 MG IV SOLR
INTRAVENOUS | Status: DC | PRN
  Administered 2017-05-02: 1000 mg via INTRAVENOUS

## 2017-05-02 MED ORDER — VANCOMYCIN HCL IN DEXTROSE 1-5 GM/200ML-% IV SOLN
1000.0000 mg | Freq: Two times a day (BID) | INTRAVENOUS | Status: AC
  Administered 2017-05-02: 1000 mg via INTRAVENOUS
  Filled 2017-05-02: qty 200

## 2017-05-02 MED ORDER — MENTHOL 3 MG MT LOZG: 1.0000 | LOZENGE | OROMUCOSAL | Status: DC | PRN

## 2017-05-02 MED ORDER — KETOROLAC TROMETHAMINE 15 MG/ML IJ SOLN
7.5000 mg | Freq: Four times a day (QID) | INTRAMUSCULAR | Status: AC
  Administered 2017-05-02 (×3): 7.5 mg via INTRAVENOUS
  Filled 2017-05-02 (×2): qty 1

## 2017-05-02 MED ORDER — BISACODYL 10 MG RE SUPP: 10.0000 mg | Freq: Every day | RECTAL | Status: DC | PRN

## 2017-05-02 MED ORDER — MAGNESIUM 250 MG PO TABS: 500.0000 mg | ORAL_TABLET | Freq: Every evening | ORAL | Status: DC

## 2017-05-02 MED ORDER — VANCOMYCIN HCL IN DEXTROSE 1-5 GM/200ML-% IV SOLN
INTRAVENOUS | Status: AC
  Filled 2017-05-02: qty 200

## 2017-05-02 MED ORDER — GLYCOPYRROLATE 0.2 MG/ML IJ SOLN
INTRAMUSCULAR | Status: DC | PRN
  Administered 2017-05-02: 0.2 mg via INTRAVENOUS

## 2017-05-02 MED ORDER — LIDOCAINE HCL (CARDIAC) 20 MG/ML IV SOLN
INTRAVENOUS | Status: DC | PRN
  Administered 2017-05-02: 80 mg via INTRAVENOUS

## 2017-05-02 SURGICAL SUPPLY — 61 items
BAG DECANTER FOR FLEXI CONT (MISCELLANEOUS) ×3 IMPLANT
BANDAGE ESMARK 6X9 LF (GAUZE/BANDAGES/DRESSINGS) ×1 IMPLANT
BLADE SAGITTAL 25.0X1.19X90 (BLADE) ×2 IMPLANT
BLADE SAGITTAL 25.0X1.19X90MM (BLADE) ×1
BNDG CMPR 9X6 STRL LF SNTH (GAUZE/BANDAGES/DRESSINGS) ×1
BNDG ESMARK 6X9 LF (GAUZE/BANDAGES/DRESSINGS) ×3
BOWL SMART MIX CTS (DISPOSABLE) ×3 IMPLANT
CAP KNEE TOTAL 3 SIGMA ×2 IMPLANT
CEMENT HV SMART SET (Cement) ×6 IMPLANT
COVER SURGICAL LIGHT HANDLE (MISCELLANEOUS) ×3 IMPLANT
CUFF TOURNIQUET SINGLE 34IN LL (TOURNIQUET CUFF) IMPLANT
CUFF TOURNIQUET SINGLE 44IN (TOURNIQUET CUFF) IMPLANT
DECANTER SPIKE VIAL GLASS SM (MISCELLANEOUS) ×3 IMPLANT
DRAPE EXTREMITY T 121X128X90 (DRAPE) IMPLANT
DRAPE HALF SHEET 40X57 (DRAPES) ×6 IMPLANT
DRSG ADAPTIC 3X8 NADH LF (GAUZE/BANDAGES/DRESSINGS) ×3 IMPLANT
DRSG PAD ABDOMINAL 8X10 ST (GAUZE/BANDAGES/DRESSINGS) ×6 IMPLANT
DURAPREP 26ML APPLICATOR (WOUND CARE) ×6 IMPLANT
ELECT CAUTERY BLADE 6.4 (BLADE) ×3 IMPLANT
ELECT REM PT RETURN 9FT ADLT (ELECTROSURGICAL) ×3
ELECTRODE REM PT RTRN 9FT ADLT (ELECTROSURGICAL) ×1 IMPLANT
EVACUATOR 1/8 PVC DRAIN (DRAIN) ×2 IMPLANT
FACESHIELD WRAPAROUND (MASK) ×6 IMPLANT
FACESHIELD WRAPAROUND OR TEAM (MASK) ×2 IMPLANT
GAUZE SPONGE 4X4 12PLY STRL (GAUZE/BANDAGES/DRESSINGS) ×3 IMPLANT
GLOVE BIOGEL PI IND STRL 8 (GLOVE) ×1 IMPLANT
GLOVE BIOGEL PI IND STRL 8.5 (GLOVE) ×1 IMPLANT
GLOVE BIOGEL PI INDICATOR 8 (GLOVE) ×2
GLOVE BIOGEL PI INDICATOR 8.5 (GLOVE) ×2
GLOVE ECLIPSE 8.0 STRL XLNG CF (GLOVE) ×6 IMPLANT
GLOVE SURG ORTHO 8.5 STRL (GLOVE) ×6 IMPLANT
GOWN STRL REUS W/ TWL LRG LVL3 (GOWN DISPOSABLE) ×2 IMPLANT
GOWN STRL REUS W/TWL 2XL LVL3 (GOWN DISPOSABLE) ×3 IMPLANT
GOWN STRL REUS W/TWL LRG LVL3 (GOWN DISPOSABLE) ×6
HANDPIECE INTERPULSE COAX TIP (DISPOSABLE) ×3
KIT BASIN OR (CUSTOM PROCEDURE TRAY) ×3 IMPLANT
KIT ROOM TURNOVER OR (KITS) ×3 IMPLANT
MANIFOLD NEPTUNE II (INSTRUMENTS) ×3 IMPLANT
NEEDLE 22X1 1/2 (OR ONLY) (NEEDLE) ×3 IMPLANT
NS IRRIG 1000ML POUR BTL (IV SOLUTION) ×3 IMPLANT
PACK TOTAL JOINT (CUSTOM PROCEDURE TRAY) ×3 IMPLANT
PAD ABD 8X10 STRL (GAUZE/BANDAGES/DRESSINGS) ×2 IMPLANT
PAD ARMBOARD 7.5X6 YLW CONV (MISCELLANEOUS) ×6 IMPLANT
PAD CAST 4YDX4 CTTN HI CHSV (CAST SUPPLIES) ×1 IMPLANT
PADDING CAST COTTON 4X4 STRL (CAST SUPPLIES) ×3
PADDING CAST COTTON 6X4 STRL (CAST SUPPLIES) ×3 IMPLANT
SET HNDPC FAN SPRY TIP SCT (DISPOSABLE) ×1 IMPLANT
STAPLER VISISTAT 35W (STAPLE) ×3 IMPLANT
SUCTION FRAZIER HANDLE 10FR (MISCELLANEOUS) ×2
SUCTION TUBE FRAZIER 10FR DISP (MISCELLANEOUS) ×1 IMPLANT
SURGIFLO W/THROMBIN 8M KIT (HEMOSTASIS) IMPLANT
SUT BONE WAX W31G (SUTURE) ×3 IMPLANT
SUT ETHIBOND NAB CT1 #1 30IN (SUTURE) ×6 IMPLANT
SUT MNCRL AB 3-0 PS2 18 (SUTURE) ×3 IMPLANT
SUT VIC AB 0 CT1 27 (SUTURE) ×3
SUT VIC AB 0 CT1 27XBRD ANBCTR (SUTURE) ×1 IMPLANT
SYR CONTROL 10ML LL (SYRINGE) IMPLANT
TOWEL OR 17X24 6PK STRL BLUE (TOWEL DISPOSABLE) ×3 IMPLANT
TOWEL OR 17X26 10 PK STRL BLUE (TOWEL DISPOSABLE) ×3 IMPLANT
TRAY FOLEY BAG SILVER LF 16FR (SET/KITS/TRAYS/PACK) ×3 IMPLANT
WRAP KNEE MAXI GEL POST OP (GAUZE/BANDAGES/DRESSINGS) ×3 IMPLANT

## 2017-05-02 NOTE — Anesthesia Postprocedure Evaluation (Signed)
Anesthesia Post Note  Patient: Caitlin [name]  Procedure(s) Performed: LEFT TOTAL KNEE ARTHROPLASTY (Left Knee)     Patient location during evaluation: PACU Anesthesia Type: Spinal and MAC Level of consciousness: oriented and awake and alert Pain management: pain level controlled Vital Signs Assessment: post-procedure vital signs reviewed and stable Respiratory status: spontaneous breathing, respiratory function stable and patient connected to nasal cannula oxygen Cardiovascular status: blood pressure returned to baseline and stable Postop Assessment: no headache, no backache and no apparent nausea or vomiting Anesthetic complications: no    Last Vitals:  Vitals:   05/02/17 1036 05/02/17 1045  BP: 120/74   Pulse: 81 78  Resp: (!) 22 16  Temp:    SpO2: (!) 88% 100%    Last Pain:  Vitals:   05/02/17 1036  TempSrc:   PainSc: [place]

## 2017-05-02 NOTE — Discharge Instructions (Signed)
Information on my medicine - XARELTO® (Rivaroxaban) ° °This medication education was reviewed with me or my healthcare representative as part of my discharge preparation.  The pharmacist that spoke with me during my hospital stay was:  Pasha Gadison Dien, RPH ° °Why was Xarelto® prescribed for you? °Xarelto® was prescribed for you to reduce the risk of blood clots forming after orthopedic surgery. The medical term for these abnormal blood clots is venous thromboembolism (VTE). ° °What do you need to know about xarelto® ? °Take your Xarelto® ONCE DAILY at the same time every day. °You may take it either with or without food. ° °If you have difficulty swallowing the tablet whole, you may crush it and mix in applesauce just prior to taking your dose. ° °Take Xarelto® exactly as prescribed by your doctor and DO NOT stop taking Xarelto® without talking to the doctor who prescribed the medication.  Stopping without other VTE prevention medication to take the place of Xarelto® may increase your risk of developing a clot. ° °After discharge, you should have regular check-up appointments with your healthcare provider that is prescribing your Xarelto®.   ° °What do you do if you miss a dose? °If you miss a dose, take it as soon as you remember on the same day then continue your regularly scheduled once daily regimen the next day. Do not take two doses of Xarelto® on the same day.  ° °Important Safety Information °A possible side effect of Xarelto® is bleeding. You should call your healthcare provider right away if you experience any of the following: °? Bleeding from an injury or your nose that does not stop. °? Unusual colored urine (red or dark brown) or unusual colored stools (red or black). °? Unusual bruising for unknown reasons. °? A serious fall or if you hit your head (even if there is no bleeding). ° °Some medicines may interact with Xarelto® and might increase your risk of bleeding while on Xarelto®. To help avoid  this, consult your healthcare provider or pharmacist prior to using any new prescription or non-prescription medications, including herbals, vitamins, non-steroidal anti-inflammatory drugs (NSAIDs) and supplements. ° °This website has more information on Xarelto®: www.xarelto.com. ° ° ° °

## 2017-05-02 NOTE — Op Note (Signed)
NAME:  Caitlin [name], AGUON                    ACCOUNT NO.:  MEDICAL RECORD NO.:  [id number]  LOCATION:                                 FACILITY:  PHYSICIAN:  Vonna Kotyk. Durward Fortes, M.D.    DATE OF BIRTH:  DATE OF PROCEDURE:  05/02/2017 DATE OF DISCHARGE:                              OPERATIVE REPORT   PREOPERATIVE DIAGNOSIS:  End-stage osteoarthritis, left knee.  POSTOPERATIVE DIAGNOSIS:  End-stage osteoarthritis, left knee.  PROCEDURE:  Left total knee replacement.  SURGEON:  Vonna Kotyk. Durward Fortes, M.D.  ASSISTANT:  Aaron Edelman D. Petrarca, P.A.-C.  ANESTHESIA:  Spinal with IV sedation and adductor canal block, left lower extremity.  COMPLICATIONS:  None.  COMPONENTS:  DePuy LCS standard plus femoral component, a #3 rotating keeled tibial tray with a 10-mm polyethylene bridging bearing, a metal back 3-peg rotating patella.  Components were secured with polymethyl methacrylate.  DESCRIPTION OF PROCEDURE:  Mrs. Shankland was met in the holding area, identified the left knee as the appropriate operative site, and marked it accordingly.  Anesthesia performed an adductor canal block.  Mrs. Monte was then transported to room #7 and carefully placed on the operating table.  Spinal anesthesia was performed by Anesthesia.  The patient was then placed in supine position.  Nursing staff inserted a Foley catheter.  Urine was clear.  A thigh tourniquet was applied to the left lower extremity.  The left leg was then prepped with chlorhexidine scrub and DuraPrep x2 from the tourniquet to the tips of the toes. Sterile draping was performed.  Time-out was called.  With the extremity elevated, was Esmarch exsanguinated with a proximal tourniquet at 350 mmHg.  A midline longitudinal incision was made, centered about the patella, extending from the superior pouch to the tibial tubercle.  Via sharp dissection, incision was carried down to subcutaneous tissue.  There was abundant adipose tissue that was  incised in the midline.  First layer of capsule was sized to the midline.  A medial parapatellar incision was made with a Bovie through the deep capsule.  The joint was entered. There was a large clear yellow joint effusion.  The patella was everted 180 degrees laterally and knee flexed to 90 degrees.  There were large osteophytes along the medial and lateral femoral condyle and to a lesser extent in the medial and lateral tibial plateau.  These were removed. Abundant synovium was identified.  A radical synovectomy was performed. I did send specimens to the lab for cytology as it appeared to be unusual.  I measured a standard plus femoral component.  First, bony cut was then made transversely in the proximal tibia with a 7-degree angle of declination.  After each bony cut on the femur and the tibia, I made sure that we had appropriate alignment with the external tibial and femoral guides.  Subsequent cuts were then made on the femur using the standard plus femoral jig.  I did use a 4-degree distal femoral valgus cut.  Finishing cuts were then made on the femur to obtain center hole for the prosthesis.  Laminar spreaders were then inserted in the lateral and medial compartments.  I performed a medial lateral  meniscectomy.  No loose bodies.  ACL and PCL were removed.  Osteophytes were removed from the posterior femoral condyle using a 3/4-inch curved osteotome.  The flexion and extension gaps were perfectly symmetrical at 10 mm.  LCL and MCL remained intact.  Retractors were then placed about the tibia, was advanced anteriorly and measured #3 tibial tray, this was pinned in place.  We checked alignment.  Center hole was then made followed by the keeled cut.  With the tibial jig in place, I applied a 10 mm polyethylene bridging bearing, followed by the standard plus trial femoral component. Components were reduced and through a full range of motion and full extension, no opening with  varus or valgus stress and well over 120 degrees without malrotation of the tibial tray.  The patella was then prepared by removing approximately 10 mm of bone, leaving about 12.5 mm of patella thickness.  Patellar jig was applied, 3 holes made.  Trial patella inserted and reduced.  Full range of motion remained stable.  Trial components were removed.  Joint was copiously irrigated with saline solution.  Final components were then impacted with polymethyl methacrylate after glove change.  We initially inserted the tibial tray followed by the 10- mm polyethylene bridging bearing and the femoral component.  Extraneous methacrylate was removed from the periphery of the components.  The knee was then placed in extension.  We applied the patella with a bone clamp and methacrylate.  At approximately 16 minutes, the methacrylate had matured; during which time, we irrigated the joint, then injected 0.25% Marcaine with epinephrine.  Tourniquet was deflated at 76 minutes.  We did not find any further extraneous methacrylate.  We did apply tranexamic acid topically under compression for about 5 minutes.  Any further bleeding was controlled with the Bovie.  A medium-sized Hemovac was inserted through the lateral capsule.  The deep capsule was then closed with a running #1 Ethibond, superficial capsule with running 0 Vicryl, subcu with 2-0 Vicryl and 3-0 Monocryl.  Skin closed with skin clips.  Sterile bulky dressing was applied followed by the patient's support stocking.  The patient tolerated the procedure without complications.     Vonna Kotyk. Durward Fortes, M.D.     PWW/MEDQ  D:  05/02/2017  T:  05/02/2017  Job:  [id number]

## 2017-05-02 NOTE — Transfer of Care (Signed)
Immediate Anesthesia Transfer of Care Note  Patient: Caitlin [name]  Procedure(s) Performed: LEFT TOTAL KNEE ARTHROPLASTY (Left Knee)  Patient Location: PACU  Anesthesia Type:MAC and Regional  Level of Consciousness: awake, alert  and oriented  Airway & Oxygen Therapy: Patient Spontanous Breathing and Patient connected to nasal cannula oxygen  Post-op Assessment: Report given to RN and Post -op Vital signs reviewed and stable  Post vital signs: Reviewed and stable  Last Vitals:  Vitals:   05/02/17 0631  BP: (!) 117/57  Pulse: 81  Resp: 18  Temp: 36.7 C    Last Pain:  Vitals:   05/02/17 0631  TempSrc: Oral  PainSc:       Patients Stated Pain Goal: 3 (37/04/88 8916)  Complications: No apparent anesthesia complications

## 2017-05-02 NOTE — Anesthesia Procedure Notes (Signed)
Anesthesia Regional Block: Adductor canal block   Pre-Anesthetic Checklist: ,, timeout performed, Correct Patient, Correct Site, Correct Laterality, Correct Procedure, Correct Position, site marked, Risks and benefits discussed,  Surgical consent,  Pre-op evaluation,  At surgeon's request and post-op pain management  Laterality: Left  Prep: chloraprep       Needles:  Injection technique: Single-shot  Needle Type: Echogenic Needle     Needle Length: 9cm  Needle Gauge: 21     Additional Needles:   Narrative:  Start time: 05/02/2017 7:09 AM End time: 05/02/2017 7:14 AM Injection made incrementally with aspirations every 5 mL.  Performed by: Personally  Anesthesiologist: Sani Madariaga  Additional Notes: Pt tolerated the procedure well.

## 2017-05-02 NOTE — Progress Notes (Signed)
Orthopedic Tech Progress Note Patient Details:  Caitlin [name] Dec 16, 1945 [id number]  CPM Left Knee CPM Left Knee: On Left Knee Flexion (Degrees): 90 Left Knee Extension (Degrees): 0  Ortho Devices Ortho Device/Splint Location: applied ohf to bed Ortho Device/Splint Interventions: Application, Ordered, Adjustment   Braulio Bosch 05/02/2017, 11:14 AM

## 2017-05-02 NOTE — H&P (Signed)
The recent History & Physical has been reviewed. I have personally examined the patient today. There is no interval change to the documented History & Physical. The patient would like to proceed with the procedure.  [name] 05/02/2017,  7:05 AM

## 2017-05-02 NOTE — Progress Notes (Signed)
Orthopedic Tech Progress Note Patient Details:  Caitlin [name] 11/18/1945 [id number]  Ortho Devices Ortho Device/Splint Location: on cpm at 2100 Ortho Device/Splint Interventions: Application, Ordered, Adjustment   Braulio Bosch 05/02/2017, 9:12 PM

## 2017-05-02 NOTE — Op Note (Signed)
PATIENT ID:      Caitlin [name]  MRN:     [id number] DOB/AGE:    08-03-1945 / 71 y.o.       OPERATIVE REPORT    DATE OF PROCEDURE:  05/02/2017       PREOPERATIVE DIAGNOSIS: END STAGE  LEFT KNEE OSTEOARTHRITIS                                                       Estimated body mass index is 35.07 kg/m as calculated from the following:   Height as of this encounter: 5\' 3"  (1.6 m).   Weight as of this encounter: 198 lb (89.8 kg).     POSTOPERATIVE DIAGNOSIS: END STAGE  LEFT KNEE OSTEOARTHRITIS                                                                     Estimated body mass index is 35.07 kg/m as calculated from the following:   Height as of this encounter: 5\' 3"  (1.6 m).   Weight as of this encounter: 198 lb (89.8 kg).     PROCEDURE:  Procedure(s): LEFT TOTAL KNEE ARTHROPLASTY      SURGEON:  Joni Fears, MD    ASSISTANT:   Biagio Borg, PA-C   (Present and scrubbed throughout the case, critical for assistance with exposure, retraction, instrumentation, and closure.)          ANESTHESIA: regional, spinal and IV sedation     DRAINS: HEMOVAC DRAIN, CLAMPED LEFT KNEE   TOURNIQUET TIME: 76 MIN   COMPLICATIONS:  None   CONDITION:  stable  PROCEDURE IN DETAIL: [id number]   [name] 05/02/2017, 9:19 AM

## 2017-05-02 NOTE — Evaluation (Signed)
Physical Therapy Evaluation Patient Details Name: Caitlin [name] MRN: [id number] DOB: 1946-04-18 Today's Date: 05/02/2017   History of Present Illness  Pt is a 71 y/o female s/p elective L TKA. PMH includes R breast cancer, HTN, and asthma.   Clinical Impression  Pt is s/p surgery above with deficits below. PTA, pt was using cane for ambulation and occasional use of walker. Upon eval, pt presenting with post op pain and weakness. Also decreased balance, secondary to L knee buckling. Required min to min guard assist for mobility. Will need DME below and will have assist from husband at home. Follow up recommendations per MD arrangements. Will continue to follow acutely to maximize functional mobility independence and safety.     Follow Up Recommendations DC plan and follow up therapy as arranged by surgeon;Supervision for mobility/OOB    Equipment Recommendations  Rolling walker with 5" wheels;3in1 (PT)    Recommendations for Other Services       Precautions / Restrictions Precautions Precautions: Knee Precaution Booklet Issued: Yes (comment) Precaution Comments: Reviewed supine ther ex with pt.  Restrictions Weight Bearing Restrictions: Yes LLE Weight Bearing: Partial weight bearing LLE Partial Weight Bearing Percentage or Pounds: 50      Mobility  Bed Mobility Overal bed mobility: Needs Assistance Bed Mobility: Supine to Sit     Supine to sit: Supervision;HOB elevated     General bed mobility comments: Supervision for safety. Use of elevated HOB.   Transfers Overall transfer level: Needs assistance Equipment used: Rolling walker (2 wheeled) Transfers: Sit to/from Stand Sit to Stand: Min assist         General transfer comment: Min A for lift assist. Verbal cues for safe hand placement.   Ambulation/Gait Ambulation/Gait assistance: Min assist Ambulation Distance (Feet): 5 Feet Assistive device: Rolling walker (2 wheeled) Gait Pattern/deviations: Step-to  pattern;Decreased step length - right;Decreased step length - left;Decreased weight shift to left;Antalgic Gait velocity: Decreased  Gait velocity interpretation: Below normal speed for age/gender General Gait Details: Slow, antalgic gait secondary to post op pain and weakness. Noted L knee buckling during stance, therefore distance limited to chair. Verbal cues for sequencing and to press through UE to maintain PWB.   Stairs            Wheelchair Mobility    Modified Rankin (Stroke Patients Only)       Balance Overall balance assessment: Needs assistance Sitting-balance support: No upper extremity supported;Feet supported Sitting balance-Leahy Scale: Good     Standing balance support: Bilateral upper extremity supported;During functional activity Standing balance-Leahy Scale: Poor Standing balance comment: Reliant on UE support                              Pertinent Vitals/Pain Pain Assessment: 0-10 Pain Score: 6  Pain Location: L knee  Pain Descriptors / Indicators: Aching;Operative site guarding Pain Intervention(s): Limited activity within patient's tolerance;Monitored during session;Repositioned    Home Living Family/patient expects to be discharged to:: Private residence Living Arrangements: Spouse/significant other Available Help at Discharge: Family;Available 24 hours/day Type of Home: House Home Access: Stairs to enter Entrance Stairs-Rails: None Entrance Stairs-Number of Steps: 2 Home Layout: Two level;Able to live on main level with bedroom/bathroom Home Equipment: [employer] [place] - single point;Bedside commode;Shower seat;Other (comment) (CPM)      Prior Function Level of Independence: Independent with assistive device(s)         Comments: Was mostly using cane, however, used walker on occaisions.  Hand Dominance   Dominant Hand: Right    Extremity/Trunk Assessment   Upper Extremity Assessment Upper Extremity Assessment: Defer to OT  evaluation    Lower Extremity Assessment Lower Extremity Assessment: LLE deficits/detail LLE Deficits / Details: Numbness at the L foot. Deficits consistent with post op pain and weakness. Able to perform ther ex below.     Cervical / Trunk Assessment Cervical / Trunk Assessment: Normal  Communication   Communication: No difficulties  Cognition Arousal/Alertness: Awake/alert Behavior During Therapy: WFL for tasks assessed/performed Overall Cognitive Status: Within Functional Limits for tasks assessed                                        General Comments General comments (skin integrity, edema, etc.): Pt's husband present during session.     Exercises Total Joint Exercises Ankle Circles/Pumps: AROM;Both;20 reps Quad Sets: AROM;Left;10 reps Towel Squeeze: AROM;Both;10 reps Hip ABduction/ADduction: AROM;Left;10 reps   Assessment/Plan    PT Assessment Patient needs continued PT services  PT Problem List Decreased strength;Decreased range of motion;Decreased balance;Decreased mobility;Decreased knowledge of use of DME;Decreased knowledge of precautions;Pain       PT Treatment Interventions DME instruction;Gait training;Functional mobility training;Stair training;Therapeutic exercise;Therapeutic activities;Neuromuscular re-education;Balance training;Patient/family education    PT Goals (Current goals can be found in the Care Plan section)  Acute Rehab PT Goals Patient Stated Goal: to go home  PT Goal Formulation: With patient Time For Goal Achievement: 05/09/17 Potential to Achieve Goals: Good    Frequency 7X/week   Barriers to discharge        Co-evaluation               AM-PAC PT "6 Clicks" Daily Activity  Outcome Measure Difficulty turning over in bed (including adjusting bedclothes, sheets and blankets)?: None Difficulty moving from lying on back to sitting on the side of the bed? : None Difficulty sitting down on and standing up from a  chair with arms (e.g., wheelchair, bedside commode, etc,.)?: Unable Help needed moving to and from a bed to chair (including a wheelchair)?: A Little Help needed walking in hospital room?: A Little Help needed climbing 3-5 steps with a railing? : A Lot 6 Click Score: 17    End of Session Equipment Utilized During Treatment: Gait belt Activity Tolerance: Patient tolerated treatment well Patient left: in chair;with call bell/phone within reach;with family/visitor present Nurse Communication: Mobility status;Precautions PT Visit Diagnosis: Other abnormalities of gait and mobility (R26.89);Pain Pain - Right/Left: Left Pain - part of body: Knee    Time: 1332-1400 PT Time Calculation (min) (ACUTE ONLY): 28 min   Charges:   PT Evaluation $PT Eval Low Complexity: 1 Low PT Treatments $Gait Training: 8-22 mins   PT G Codes:        Leighton Ruff, PT, DPT  Acute Rehabilitation Services  Pager: [phone]   Rudean Hitt 05/02/2017, 3:24 PM

## 2017-05-02 NOTE — Evaluation (Signed)
Occupational Therapy Evaluation and Discharge Patient Details Name: Caitlin [name] MRN: [id number] DOB: 1945-09-28 Today's Date: 05/02/2017    History of Present Illness Pt is a 71 y/o female s/p elective L TKA. PMH includes R breast cancer, HTN, and asthma.    Clinical Impression   PTA, pt was independent with cane with ADL and functional mobility. Pt currently able to complete all ADL and ADL transfers with supervision to min guard assist. Educated pt concerning compensatory strategies for LB ADL, safe use of DME for shower transfers, and fall prevention tasks and pt and husband demonstrate understanding. All education has been completed and the patient has no further questions. See below for any follow-up Occupational Therapy or equipment needs. OT to sign off. Thank you for referral.      Follow Up Recommendations  No OT follow up;Supervision/Assistance - 24 hour    Equipment Recommendations  3 in 1 bedside commode    Recommendations for Other Services       Precautions / Restrictions Precautions Precautions: Knee Precaution Booklet Issued: No Precaution Comments: Reviewed knee precuations related to ADL.  Restrictions Weight Bearing Restrictions: Yes LLE Weight Bearing: Partial weight bearing LLE Partial Weight Bearing Percentage or Pounds: 50      Mobility Bed Mobility Overal bed mobility: Needs Assistance Bed Mobility: Supine to Sit     Supine to sit: Supervision;HOB elevated     General bed mobility comments: OOB in chair on arrival  Transfers Overall transfer level: Needs assistance Equipment used: Rolling walker (2 wheeled) Transfers: Sit to/from Stand Sit to Stand: Min guard         General transfer comment: Min guard to steady    Balance Overall balance assessment: Needs assistance Sitting-balance support: No upper extremity supported;Feet supported Sitting balance-Leahy Scale: Good     Standing balance support: Bilateral upper extremity  supported;During functional activity Standing balance-Leahy Scale: Poor Standing balance comment: Reliant on UE support                            ADL either performed or assessed with clinical judgement   ADL Overall ADL's : Needs assistance/impaired Eating/Feeding: Set up;Sitting   Grooming: Set up;Sitting   Upper Body Bathing: Set up;Sitting   Lower Body Bathing: Min guard;Sit to/from stand   Upper Body Dressing : Set up;Sitting   Lower Body Dressing: Min guard;Sit to/from stand   Toilet Transfer: Min guard;Ambulation;BSC;RW   Toileting- Water quality scientist and Hygiene: Min guard;Sit to/from stand   Tub/ Shower Transfer: Min guard;Ambulation;3 in 1;Walk-in shower;Rolling walker   Functional mobility during ADLs: Min guard;Rolling walker General ADL Comments: Educated pt on compensatory strategies for ADL tasks, home set-up for safety, and fall prevention.      Vision Patient Visual Report: No change from baseline Vision Assessment?: No apparent visual deficits     Perception     Praxis      Pertinent Vitals/Pain Pain Assessment: Faces Pain Score: 6  Faces Pain Scale: Hurts little more Pain Location: L knee  Pain Descriptors / Indicators: Aching;Operative site guarding Pain Intervention(s): Limited activity within patient's tolerance;Monitored during session;Repositioned     Hand Dominance Right   Extremity/Trunk Assessment Upper Extremity Assessment Upper Extremity Assessment: Overall WFL for tasks assessed   Lower Extremity Assessment Lower Extremity Assessment: LLE deficits/detail LLE Deficits / Details: Decreased strength and ROM as expected post-operatively.    Cervical / Trunk Assessment Cervical / Trunk Assessment: Normal   Communication Communication  Communication: No difficulties   Cognition Arousal/Alertness: Awake/alert Behavior During Therapy: WFL for tasks assessed/performed Overall Cognitive Status: Within Functional Limits  for tasks assessed                                     General Comments  Husband present and engaged in session.     Exercises Exercises: Total Joint Total Joint Exercises Ankle Circles/Pumps: AROM;Both;20 reps Quad Sets: AROM;Left;10 reps Towel Squeeze: AROM;Both;10 reps Hip ABduction/ADduction: AROM;Left;10 reps   Shoulder Instructions      Home Living Family/patient expects to be discharged to:: Private residence Living Arrangements: Spouse/significant other Available Help at Discharge: Family;Available 24 hours/day Type of Home: House Home Access: Stairs to enter [employer] of Steps: 2 Entrance Stairs-Rails: None Home Layout: Two level;Able to live on main level with bedroom/bathroom     Bathroom Shower/Tub: Occupational psychologist: Handicapped height     Home Equipment: [place] - single point;Bedside commode;Shower seat;Other (comment);Walker - standard;Grab bars - toilet;Grab bars - tub/shower;Hand held shower head (CPM)          Prior Functioning/Environment Level of Independence: Independent with assistive device(s)        Comments: Was mostly using cane, however, used walker on occaisions.         OT Problem List: Decreased strength;Decreased range of motion;Impaired balance (sitting and/or standing);Decreased knowledge of use of DME or AE;Pain      OT Treatment/Interventions:      OT Goals(Current goals can be found in the care plan section) Acute Rehab OT Goals Patient Stated Goal: to go home  OT Goal Formulation: With patient Time For Goal Achievement: 05/16/17 Potential to Achieve Goals: Good  OT Frequency:     Barriers to D/C:            Co-evaluation              AM-PAC PT "6 Clicks" Daily Activity     Outcome Measure Help from another person eating meals?: None Help from another person taking care of personal grooming?: None Help from another person toileting, which includes using toliet,  bedpan, or urinal?: A Little Help from another person bathing (including washing, rinsing, drying)?: A Little Help from another person to put on and taking off regular upper body clothing?: None Help from another person to put on and taking off regular lower body clothing?: A Little 6 Click Score: 21   End of Session Equipment Utilized During Treatment: Rolling walker CPM Left Knee CPM Left Knee: Off  Activity Tolerance: Patient tolerated treatment well Patient left: in chair;with call bell/phone within reach;with family/visitor present  OT Visit Diagnosis: Other abnormalities of gait and mobility (R26.89);Pain Pain - Right/Left: Left Pain - part of body: Knee                Time: 1610-9604 OT Time Calculation (min): 20 min Charges:  OT General Charges $OT Visit: 1 Visit OT Evaluation $OT Eval Moderate Complexity: 1 Mod G-Codes:     Norman Herrlich, MS OTR/L  Pager: Roosevelt A [place] Deupree 05/02/2017, 4:57 PM

## 2017-05-02 NOTE — Anesthesia Preprocedure Evaluation (Signed)
Anesthesia Evaluation  Patient identified by MRN, date of birth, ID band Patient awake    Reviewed: Allergy & Precautions, H&P , NPO status , Patient's Chart, lab work & pertinent test results  Airway Mallampati: II   Neck ROM: full    Dental   Pulmonary asthma ,    breath sounds clear to auscultation       Cardiovascular hypertension, + Peripheral Vascular Disease   Rhythm:regular Rate:Normal     Neuro/Psych    GI/Hepatic hiatal hernia, GERD  ,  Endo/Other    Renal/GU      Musculoskeletal  (+) Arthritis ,   Abdominal   Peds  Hematology   Anesthesia Other Findings   Reproductive/Obstetrics                             Anesthesia Physical Anesthesia Plan  ASA: II  Anesthesia Plan: Spinal   Post-op Pain Management:  Regional for Post-op pain   Induction: Intravenous  PONV Risk Score and Plan: 2 and Ondansetron, Dexamethasone, Propofol infusion, Midazolam and Treatment may vary due to age or medical condition  Airway Management Planned: Simple Face Mask  Additional Equipment:   Intra-op Plan:   Post-operative Plan:   Informed Consent: I have reviewed the patients History and Physical, chart, labs and discussed the procedure including the risks, benefits and alternatives for the proposed anesthesia with the patient or authorized representative who has indicated his/her understanding and acceptance.     Plan Discussed with: CRNA, Anesthesiologist and Surgeon  Anesthesia Plan Comments:         Anesthesia Quick Evaluation

## 2017-05-03 ENCOUNTER — Encounter (HOSPITAL_COMMUNITY): Payer: Self-pay | Admitting: Orthopaedic Surgery

## 2017-05-03 LAB — CBC
HEMATOCRIT: 29.6 % — AB (ref 36.0–46.0)
Hemoglobin: 10.1 g/dL — ABNORMAL LOW (ref 12.0–15.0)
MCH: 33.2 pg (ref 26.0–34.0)
MCHC: 34.1 g/dL (ref 30.0–36.0)
MCV: 97.4 fL (ref 78.0–100.0)
Platelets: 203 10*3/uL (ref 150–400)
RBC: 3.04 MIL/uL — ABNORMAL LOW (ref 3.87–5.11)
RDW: 12.1 % (ref 11.5–15.5)
WBC: 6 10*3/uL (ref 4.0–10.5)

## 2017-05-03 LAB — BASIC METABOLIC PANEL
ANION GAP: 9 (ref 5–15)
BUN: 12 mg/dL (ref 6–20)
CALCIUM: 8.7 mg/dL — AB (ref 8.9–10.3)
CO2: 23 mmol/L (ref 22–32)
Chloride: 95 mmol/L — ABNORMAL LOW (ref 101–111)
Creatinine, Ser: 0.64 mg/dL (ref 0.44–1.00)
Glucose, Bld: 97 mg/dL (ref 65–99)
Potassium: 4.1 mmol/L (ref 3.5–5.1)
Sodium: 127 mmol/L — ABNORMAL LOW (ref 135–145)

## 2017-05-03 NOTE — Progress Notes (Signed)
PATIENT ID: TALISA PETRAK        MRN:  [id number]          DOB/AGE: 09/05/1945 / 71 y.o.    Joni Fears, MD   Biagio Borg, PA-C [address] [place], [place]  [address]                             [phone]   PROGRESS NOTE  Subjective:  negative for Chest Pain  negative for Shortness of Breath  negative for Nausea/Vomiting   negative for Calf Pain    Tolerating Diet: yes         Patient reports pain as mild and moderate.     OOB with PT this am-comfortable and no complaints  Objective: Vital signs in last 24 hours:   Patient Vitals for the past 24 hrs:  BP Temp Temp src Pulse Resp SpO2  05/03/17 0745 122/66 98.5 F (36.9 C) Oral 84 18 100 %  05/03/17 0400 123/63 98.3 F (36.8 C) Oral 80 20 97 %  05/02/17 2306 (!) 131/54 98.4 F (36.9 C) Oral 84 18 99 %  05/02/17 2150 - - - 79 18 98 %  05/02/17 2049 (!) 122/50 98.6 F (37 C) Oral 93 18 98 %  05/02/17 1757 (!) 114/56 99.2 F (37.3 C) Oral 90 16 93 %  05/02/17 1221 112/60 97.7 F (36.5 C) Oral 75 15 99 %  05/02/17 1145 - 98.1 F (36.7 C) - 71 12 99 %  05/02/17 1136 122/67 - - 84 15 100 %  05/02/17 1130 - - - 89 13 99 %  05/02/17 1121 117/69 - - 79 14 98 %  05/02/17 1115 - - - 82 17 100 %  05/02/17 1106 128/71 - - 72 10 100 %  05/02/17 1100 - - - 83 15 96 %  05/02/17 1051 105/79 - - 73 19 100 %  05/02/17 1045 - - - 78 16 100 %  05/02/17 1036 120/74 - - 81 (!) 22 (!) 88 %  05/02/17 1030 - - - 86 15 97 %  05/02/17 1021 122/78 - - 82 14 100 %  05/02/17 1015 - - - 80 12 99 %  05/02/17 1006 125/72 - - 82 14 95 %  05/02/17 1000 - - - 91 (!) 29 100 %  05/02/17 0948 119/72 97.6 F (36.4 C) - 100 17 99 %      Intake/Output from previous day:   10/02 0701 - 10/03 0700 In: 3897.5 [P.O.:1080; I.V.:2517.5] Out: 1303 [Urine:403; Drains:700]   Intake/Output this shift:   No intake/output data recorded.   Intake/Output      10/02 0701 - 10/03 0700 10/03 0701 - 10/04 0700   P.O. 1080    I.V. (mL/kg) 2517.5  (28)    IV Piggyback 300    Total Intake(mL/kg) 3897.5 (43.4)    Urine (mL/kg/hr) 403 (0.2)    Drains 700    Blood 200    Total Output 1303     Net +2594.5          Urine Occurrence 1 x       LABORATORY DATA:  Recent Labs  05/03/17 0453  WBC 6.0  HGB 10.1*  HCT 29.6*  PLT 203    Recent Labs  05/03/17 0453  NA 127*  K 4.1  CL 95*  CO2 23  BUN 12  CREATININE 0.64  GLUCOSE 97  CALCIUM 8.7*  Lab Results  Component Value Date   INR 1.02 04/25/2017    Recent Radiographic Studies :  Dg Chest 2 View  Result Date: 04/25/2017 CLINICAL DATA:  Preop for left total knee arthroplasty. EXAM: CHEST  2 VIEW COMPARISON:  Radiographs of August 31, 2015. FINDINGS: The heart size and mediastinal contours are within normal limits. Both lungs are clear. The visualized skeletal structures are unremarkable. IMPRESSION: No active cardiopulmonary disease. Electronically Signed   By: Marijo Conception, M.D.   On: 04/25/2017 14:35     Examination:  General appearance: alert, cooperative and no distress  Wound Exam: clean, dry, intact   Drainage:  Scant/small amount Bloody exudate in hemovac  Motor Exam: EHL, FHL, Anterior Tibial and Posterior Tibial Intact  Sensory Exam: Superficial Peroneal, Deep Peroneal and Tibial normal  Vascular Exam: Normal  Assessment:    1 Day Post-Op  Procedure(s) (LRB): LEFT TOTAL KNEE ARTHROPLASTY (Left)  ADDITIONAL DIAGNOSIS:  Principal Problem:   Unilateral primary osteoarthritis, left knee Active Problems:   S/P total knee replacement using cement, left  Acute Blood Loss Anemia-asymptomatic   Plan: Physical Therapy as ordered Partial Weight Bearing @ 50% (PWB)  DVT Prophylaxis:  Xarelto, Foot Pumps and TED hose  DISCHARGE PLAN: Home  DISCHARGE NEEDS: HHPT, CPM, Walker and 3-in-1 comode seat Foley out and voiding without difficulty, good effort in PT, D/C hemovac, hope for discharge to home tomorrow       [name]  05/03/2017 7:55 AM  Patient ID: Lilia Argue, female   DOB: [birth year], 71 y.o.   MRN: [id number]

## 2017-05-03 NOTE — Progress Notes (Signed)
Physical Therapy Treatment Patient Details Name: Caitlin [name] MRN: [id number] DOB: 1946-01-28 Today's Date: 05/03/2017    History of Present Illness Pt is a 71 y/o female s/p elective L TKA. PMH includes R breast cancer, HTN, and asthma.    PT Comments    Pt progressing well with mobility. Improved ability to maintain LLE 50% PWB precautions and increase amb distance with RW this morning, progressing from min guard to supervision. Reviewed therex, precautions, positioning, and importance of mobility. Pt motivated to participate with therapy. Will plan for stair training this afternoon.    Follow Up Recommendations  DC plan and follow up therapy as arranged by surgeon;Supervision for mobility/OOB     Equipment Recommendations  Rolling walker with 5" wheels;3in1 (PT)    Recommendations for Other Services       Precautions / Restrictions Precautions Precautions: Knee Precaution Comments: Verbally reviewed precautions Restrictions Weight Bearing Restrictions: Yes LLE Weight Bearing: Partial weight bearing LLE Partial Weight Bearing Percentage or Pounds: 50%    Mobility  Bed Mobility Overal bed mobility: Needs Assistance Bed Mobility: Supine to Sit     Supine to sit: Supervision;HOB elevated        Transfers Overall transfer level: Needs assistance Equipment used: Rolling walker (2 wheeled) Transfers: Sit to/from Stand Sit to Stand: Supervision            Ambulation/Gait Ambulation/Gait assistance: Min guard;Supervision Ambulation Distance (Feet): 150 Feet Assistive device: Rolling walker (2 wheeled) Gait Pattern/deviations: Step-to pattern;Decreased weight shift to left;Antalgic Gait velocity: Decreased  Gait velocity interpretation: <1.8 ft/sec, indicative of risk for recurrent falls General Gait Details: Slow, antalgic gait with improved technique maintaining LLE 50% PWB precautions. No knee buckling noted. Cues for sequencing and activity pacing secondary  to BUE fatigue   Stairs            Wheelchair Mobility    Modified Rankin (Stroke Patients Only)       Balance Overall balance assessment: Needs assistance Sitting-balance support: No upper extremity supported;Feet supported Sitting balance-Leahy Scale: Good     Standing balance support: Bilateral upper extremity supported;During functional activity Standing balance-Leahy Scale: Poor Standing balance comment: Reliant on UE support                             Cognition Arousal/Alertness: Awake/alert Behavior During Therapy: WFL for tasks assessed/performed Overall Cognitive Status: Within Functional Limits for tasks assessed                                        Exercises Total Joint Exercises Long Arc Quad: AROM;Left;10 reps;Seated Knee Flexion: AAROM;Left;10 reps;Seated Marching in Standing: AROM;Left;10 reps;Seated    General Comments        Pertinent Vitals/Pain Pain Assessment: Faces Faces Pain Scale: Hurts a little bit Pain Location: L knee  Pain Descriptors / Indicators: Aching;Operative site guarding Pain Intervention(s): Monitored during session;Limited activity within patient's tolerance    Home Living                      Prior Function            PT Goals (current goals can now be found in the care plan section) Acute Rehab PT Goals Patient Stated Goal: to go home  PT Goal Formulation: With patient Time For Goal Achievement: 05/09/17 Potential to Achieve  Goals: Good Progress towards PT goals: Progressing toward goals    Frequency    7X/week      PT Plan Current plan remains appropriate    Co-evaluation              AM-PAC PT "6 Clicks" Daily Activity  Outcome Measure  Difficulty turning over in bed (including adjusting bedclothes, sheets and blankets)?: None Difficulty moving from lying on back to sitting on the side of the bed? : None Difficulty sitting down on and standing up  from a chair with arms (e.g., wheelchair, bedside commode, etc,.)?: A Little Help needed moving to and from a bed to chair (including a wheelchair)?: A Little Help needed walking in hospital room?: A Little Help needed climbing 3-5 steps with a railing? : A Lot 6 Click Score: 19    End of Session Equipment Utilized During Treatment: Gait belt Activity Tolerance: Patient tolerated treatment well Patient left: in chair;with call bell/phone within reach;Other (comment) (PA present to remove hemovac) Nurse Communication: Mobility status PT Visit Diagnosis: Other abnormalities of gait and mobility (R26.89);Pain Pain - Right/Left: Left Pain - part of body: Knee     Time: 9244-6286 PT Time Calculation (min) (ACUTE ONLY): 20 min  Charges:  $Gait Training: 8-22 mins                    G Codes:      Mabeline Caras, PT, DPT Acute Rehab Services  Pager: [place] 05/03/2017, 8:07 AM

## 2017-05-03 NOTE — Progress Notes (Addendum)
Physical Therapy Treatment Patient Details Name: Caitlin [name] MRN: [id number] DOB: 1945/11/21 Today's Date: 05/03/2017    History of Present Illness Pt is a 71 y/o female s/p elective L TKA on 05/02/17. PMH includes R breast cancer, HTN, and asthma.    PT Comments    Pt progressing well with mobility. Mod indep for transfers and amb 300' with RW, with good technique maintaining PWB precautions; 1x standing rest break secondary to BUE fatigue. Supervision for ascend/descending steps with RW. Reviewed therex, precautions, positioning, CPM use, and importance of continued mobility. Husband present throughout session and very supportive. Pt has met short-term acute PT goals; all educaiton completed and questions answered. Feel pt is safe to return home with HHPT. Encouraged to continue amb with RW in hallway with family or nursing during rest of hospital stay (RN aware). D/c acute PT.   Follow Up Recommendations  DC plan and follow up therapy as arranged by surgeon;Supervision for mobility/OOB     Equipment Recommendations  Rolling walker with 5" wheels;3in1 (PT)    Recommendations for Other Services       Precautions / Restrictions Precautions Precautions: Knee Precaution Comments: Verbally reviewed precautions Restrictions Weight Bearing Restrictions: Yes LLE Weight Bearing: Partial weight bearing LLE Partial Weight Bearing Percentage or Pounds: 50%    Mobility  Bed Mobility Overal bed mobility: Modified Independent Bed Mobility: Sit to Supine       Sit to supine: Modified independent (Device/Increase time)      Transfers Overall transfer level: Modified independent Equipment used: Rolling walker (2 wheeled)             General transfer comment: Stood x3 throughout session mod indep with RW; 1x cues for technique with hand placement which pt able to correct  Ambulation/Gait Ambulation/Gait assistance: Modified independent (Device/Increase time) Ambulation  Distance (Feet): 300 Feet Assistive device: Rolling walker (2 wheeled) Gait Pattern/deviations: Step-through pattern;Decreased stride length;Decreased weight shift to left;Antalgic Gait velocity: Decreased  Gait velocity interpretation: <1.8 ft/sec, indicative of risk for recurrent falls General Gait Details: Improved gait mechanics today, able to increase to step-through pattern with cues for technique; great ability to offload with BUEs in order to maintain LLE 50% PWB precautions. Cues to correct upright posture    Stairs Stairs: Yes   Stair Management: No rails;Step to pattern;Backwards;Forwards;With walker Number of Stairs: 2 General stair comments: Ascend steps backwards with RW, descended forwards with RW to simulate transfer onto 2 separate steps at home; supervision for safety which husband will be able to provide at d/c  Wheelchair Mobility    Modified Rankin (Stroke Patients Only)       Balance Overall balance assessment: Needs assistance Sitting-balance support: No upper extremity supported;Feet supported Sitting balance-Leahy Scale: Good     Standing balance support: Bilateral upper extremity supported;During functional activity;No upper extremity supported Standing balance-Leahy Scale: Fair Standing balance comment: Able to static stand with no UE support                            Cognition Arousal/Alertness: Awake/alert Behavior During Therapy: WFL for tasks assessed/performed Overall Cognitive Status: Within Functional Limits for tasks assessed                                        Exercises Total Joint Exercises Long Arc Quad: AROM;Left;10 reps;Seated Knee Flexion: AAROM;Left;10 reps;Seated Marching  in Standing: AROM;Left;10 reps;Seated    General Comments General comments (skin integrity, edema, etc.): Husband present throughout session and very supportive      Pertinent Vitals/Pain Pain Assessment: Faces Faces Pain  Scale: Hurts a little bit Pain Location: L knee  Pain Descriptors / Indicators: Aching;Operative site guarding Pain Intervention(s): Monitored during session;Ice applied    Home Living                      Prior Function            PT Goals (current goals can now be found in the care plan section) Acute Rehab PT Goals Patient Stated Goal: to go home  PT Goal Formulation: With patient Time For Goal Achievement: 05/09/17 Potential to Achieve Goals: Good Progress towards PT goals: Goals met/education completed, patient discharged from PT    Frequency    7X/week      PT Plan Current plan remains appropriate    Co-evaluation              AM-PAC PT "6 Clicks" Daily Activity  Outcome Measure  Difficulty turning over in bed (including adjusting bedclothes, sheets and blankets)?: None Difficulty moving from lying on back to sitting on the side of the bed? : None Difficulty sitting down on and standing up from a chair with arms (e.g., wheelchair, bedside commode, etc,.)?: None Help needed moving to and from a bed to chair (including a wheelchair)?: None Help needed walking in hospital room?: None Help needed climbing 3-5 steps with a railing? : A Little 6 Click Score: 23    End of Session Equipment Utilized During Treatment: Gait belt Activity Tolerance: Patient tolerated treatment well Patient left: in bed;in CPM;with call bell/phone within reach;with family/visitor present Nurse Communication: Mobility status PT Visit Diagnosis: Other abnormalities of gait and mobility (R26.89);Pain Pain - Right/Left: Left Pain - part of body: Knee     Time: 1353-1420 PT Time Calculation (min) (ACUTE ONLY): 27 min  Charges:  $Gait Training: 23-37 mins                    G Codes:      Mabeline Caras, PT, DPT Acute Rehab Services  Pager: [place] 05/03/2017, 2:34 PM

## 2017-05-04 LAB — CBC
HCT: 26.1 % — ABNORMAL LOW (ref 36.0–46.0)
Hemoglobin: 9 g/dL — ABNORMAL LOW (ref 12.0–15.0)
MCH: 33.1 pg (ref 26.0–34.0)
MCHC: 34.5 g/dL (ref 30.0–36.0)
MCV: 96 fL (ref 78.0–100.0)
PLATELETS: 220 10*3/uL (ref 150–400)
RBC: 2.72 MIL/uL — ABNORMAL LOW (ref 3.87–5.11)
RDW: 12.1 % (ref 11.5–15.5)
WBC: 6.4 10*3/uL (ref 4.0–10.5)

## 2017-05-04 LAB — BASIC METABOLIC PANEL
Anion gap: 9 (ref 5–15)
BUN: 7 mg/dL (ref 6–20)
CALCIUM: 8.9 mg/dL (ref 8.9–10.3)
CO2: 25 mmol/L (ref 22–32)
CREATININE: 0.57 mg/dL (ref 0.44–1.00)
Chloride: 93 mmol/L — ABNORMAL LOW (ref 101–111)
GFR calc non Af Amer: >60 mL/min (ref >60)
Glucose, Bld: 110 mg/dL — ABNORMAL HIGH (ref 65–99)
Potassium: 4 mmol/L (ref 3.5–5.1)
SODIUM: 127 mmol/L — AB (ref 135–145)

## 2017-05-04 MED ORDER — RIVAROXABAN 10 MG PO TABS: 10.0000 mg | ORAL_TABLET | Freq: Every day | ORAL | 0 refills | Status: DC

## 2017-05-04 MED ORDER — OXYCODONE HCL 5 MG PO TABS: 5.0000 mg | ORAL_TABLET | ORAL | 0 refills | Status: DC | PRN

## 2017-05-04 MED ORDER — METHOCARBAMOL 500 MG PO TABS: 500.0000 mg | ORAL_TABLET | Freq: Three times a day (TID) | ORAL | 0 refills | Status: DC | PRN

## 2017-05-04 NOTE — Discharge Summary (Signed)
Joni Fears, MD   Biagio Borg, PA-C [address] [place], [place]  [address]                             [phone]  PATIENT ID: Caitlin [name]        MRN:  [id number]          DOB/AGE: 12/30/1945 / 71 y.o.    DISCHARGE SUMMARY  ADMISSION DATE:    05/02/2017 DISCHARGE DATE:   05/04/2017   ADMISSION DIAGNOSIS: S/P total knee replacement using cement, left [Z96.652]    DISCHARGE DIAGNOSIS:  LEFT KNEE OSTEOARTHRITIS    ADDITIONAL DIAGNOSIS: Principal Problem:   Unilateral primary osteoarthritis, left knee Active Problems:   S/P total knee replacement using cement, left  Past Medical History:  Diagnosis Date  . Arthritis   . Asthma   . Breast cancer ([place])   . Breast disorder    cancer  . Breast mass    benign  . GERD (gastroesophageal reflux disease)   . Heart murmur    AS CHILD  OUTGREW IT  . History of hiatal hernia   . Hyperlipidemia   . Hypertension   . Impaired fasting glucose   . Rectocele   . Varicose vein of leg     PROCEDURE: Procedure(s): LEFT TOTAL KNEE ARTHROPLASTY  on 05/02/2017  CONSULTS: none    HISTORY: Caitlin [name] is status post left knee arthroscopy. She had considerable chondromalacia in all 3 compartments, but mostly in the lateral compartment where she also had a tear of the lateral meniscus. Over time she's has progressive collapse of the lateral compartment prior films that were performed in May. On several occasions injected cortisone with only temporary relief of her pain. She is having compromise of her daily activities and even sleep. She's had a course of [place] supplementation through [employer] without much relief. She has been wearing a brace that helps.  HOSPITAL COURSE:  Caitlin [name] is a 71 y.o. admitted on 05/02/2017 and found to have a diagnosis of LEFT KNEE OSTEOARTHRITIS.  After appropriate laboratory studies were obtained  they were taken to the operating room on 05/02/2017 and underwent  Procedure(s): LEFT TOTAL KNEE  ARTHROPLASTY   They were given perioperative antibiotics:  Anti-infectives    Start     Dose/Rate Route Frequency Ordered Stop   05/02/17 1800  vancomycin (VANCOCIN) IVPB 1000 mg/200 mL premix     1,000 mg 200 mL/hr over 60 Minutes Intravenous Every 12 hours 05/02/17 1216 05/02/17 1842   05/02/17 0557  ceFAZolin (ANCEF) IVPB 2g/100 mL premix     2 g 200 mL/hr over 30 Minutes Intravenous On call to O.R. 05/02/17 0557 05/02/17 0730    .  Tolerated the procedure well.  Placed with a foley intraoperatively.    Toradol was given post op.  POD #1, allowed out of bed to a chair.  PT for ambulation and exercise program.  Foley D/C'd in morning.  IV saline locked.  O2 discontionued. Hemovac pulled.  POD #2, continued PT and ambulation.   . The remainder of the hospital course was dedicated to ambulation and strengthening.   The patient was discharged on 2 Days Post-Op in  Stable condition.  Blood products given:none  DIAGNOSTIC STUDIES: Recent vital signs: Patient Vitals for the past 24 hrs:  BP Temp Temp src Pulse Resp SpO2  05/04/17 1041 (!) 113/51 98.5 F (36.9 C) Oral 93 - -  05/04/17 0414 132/63 98.6 F (37 C) Oral 98 16 95 %  05/03/17 2041 (!) 119/49 99.4 F (37.4 C) Oral 92 16 99 %  05/03/17 1452 95/77 98.7 F (37.1 C) Oral 87 17 98 %       Recent laboratory studies:  Recent Labs  05/03/17 0453 05/04/17 0501  WBC 6.0 6.4  HGB 10.1* 9.0*  HCT 29.6* 26.1*  PLT 203 220    Recent Labs  05/03/17 0453 05/04/17 0501  NA 127* 127*  K 4.1 4.0  CL 95* 93*  CO2 23 25  BUN 12 7  CREATININE 0.64 0.57  GLUCOSE 97 110*  CALCIUM 8.7* 8.9   Lab Results  Component Value Date   INR 1.02 04/25/2017     Recent Radiographic Studies :  Dg Chest 2 View  Result Date: 04/25/2017 CLINICAL DATA:  Preop for left total knee arthroplasty. EXAM: CHEST  2 VIEW COMPARISON:  Radiographs of August 31, 2015. FINDINGS: The heart size and mediastinal contours are within normal limits.  Both lungs are clear. The visualized skeletal structures are unremarkable. IMPRESSION: No active cardiopulmonary disease. Electronically Signed   By: Marijo Conception, M.D.   On: 04/25/2017 14:35    DISCHARGE INSTRUCTIONS: Discharge Instructions    CPM    Complete by:  As directed    Continuous passive motion machine (CPM):      Use the CPM from 0 to 60 degrees for 6-8 hours per day.      You may increase by 5-10 per day.  You may break it up into 2 or 3 sessions per day.      Use CPM for 3-4 weeks or until you are told to stop.   Call MD / Call 911    Complete by:  As directed    If you experience chest pain or shortness of breath, CALL 911 and be transported to the hospital emergency room.  If you develope a fever above 101 F, pus (white drainage) or increased drainage or redness at the wound, or calf pain, call your surgeon's office.   Change dressing    Complete by:  As directed    DO NOT CHANGE YOUR DRESSING.   Constipation Prevention    Complete by:  As directed    Drink plenty of fluids.  Prune juice may be helpful.  You may use a stool softener, such as Colace (over the counter) 100 mg twice a day.  Use MiraLax (over the counter) for constipation as needed.   Diet general    Complete by:  As directed    Discharge instructions    Complete by:  As directed    [place] items at home which could result in a fall. This includes throw rugs or furniture in walking pathways ICE to the affected joint every three hours while awake for 30 minutes at a time, for at least the first 3-5 days, and then as needed for pain and swelling.  Continue to use ice for pain and swelling. You may notice swelling that will progress down to the foot and ankle.  This is normal after surgery.  Elevate your leg when you are not up walking on it.   Continue to use the breathing machine you got in the hospital (incentive spirometer) which will help keep your temperature down.   It is common for your temperature to cycle up and down following surgery, especially at night when you are not up  moving around and exerting yourself.  The breathing machine keeps your lungs expanded and your temperature down.   DIET:  As you were doing prior to hospitalization, we recommend a well-balanced diet.  DRESSING / WOUND CARE / SHOWERING  Keep the surgical dressing until follow up.  The dressing is water proof, so you can shower without any extra covering.  IF THE DRESSING FALLS OFF or the wound gets wet inside, change the dressing with sterile gauze.  Please use good hand washing techniques before changing the dressing.  Do not use any lotions or creams on the incision until instructed by your surgeon.    ACTIVITY  Increase activity slowly as tolerated, but follow the weight bearing instructions below.   No driving for 6 weeks or until further direction given by your physician.  You cannot drive while taking narcotics.  No lifting or carrying greater than 10 lbs. until further directed by your surgeon. Avoid periods of inactivity such as sitting longer than an hour when not asleep. This helps prevent blood clots.  You may return to work once you are authorized by your doctor.     WEIGHT BEARING   Partial weight bearing with assist device as directed.  50%   EXERCISES  Results after joint replacement surgery are often greatly improved when you follow the exercise, range of motion and muscle strengthening exercises prescribed by your doctor. Safety measures are also important to protect the joint from further injury. Any time any of these exercises cause you to have increased pain or swelling, decrease what you are doing until you are comfortable again and then slowly increase them. If you have problems or questions, call your caregiver or physical therapist for advice.   Rehabilitation is important following a joint replacement. After just a few days of immobilization, the  muscles of the leg can become weakened and shrink (atrophy).  These exercises are designed to build up the tone and strength of the thigh and leg muscles and to improve motion. Often times heat used for twenty to thirty minutes before working out will loosen up your tissues and help with improving the range of motion but do not use heat for the first two weeks following surgery (sometimes heat can increase post-operative swelling).   These exercises can be done on a training (exercise) mat,  on a table or on a bed. Use whatever works the best and is most comfortable for you.    Use music or television while you are exercising so that the exercises are a pleasant break in your day. This will make your life better with the exercises acting as a break in your routine that you can look forward to.   Perform all exercises about fifteen times, three times per day or as directed.  You should exercise both the operative leg and the other leg as well.   Exercises include:  Quad Sets - Tighten up the muscle on the front of the thigh (Quad) and hold for 5-10 seconds.   Straight Leg Raises - With your knee straight (if you were given a brace, keep it on), lift the leg to 60 degrees, hold for 3 seconds, and slowly lower the leg.  Perform this exercise against resistance later as your leg gets stronger.  Leg Slides: Lying on your back, slowly slide your foot toward your buttocks, bending your knee up off the floor (only go as far as is comfortable). Then slowly slide your foot back down until your leg  is flat on the floor again.  Angel Wings: Lying on your back spread your legs to the side as far apart as you can without causing discomfort.  Hamstring Strength:  Lying on your back, push your heel against the floor with your leg straight by tightening up the muscles of your buttocks.  Repeat, but this time bend your knee to a comfortable angle, and push your heel against the floor.  You may put a pillow under the heel to  make it more comfortable if necessary.   A rehabilitation program following joint replacement surgery can speed recovery and prevent re-injury in the future due to weakened muscles. Contact your doctor or a physical therapist for more information on knee rehabilitation.    CONSTIPATION  Constipation is defined medically as fewer than three stools per week and severe constipation as less than one stool per week.  Even if you have a regular bowel pattern at home, your normal regimen is likely to be disrupted due to multiple reasons following surgery.  Combination of anesthesia, postoperative narcotics, change in appetite and fluid intake all can affect your bowels.   YOU MUST use at least one of the following options; they are listed in order of increasing strength to get the job done.  They are all available over the counter, and you may need to use some, POSSIBLY even all of these options:    Drink plenty of fluids (prune juice may be helpful) and high fiber foods Colace 100 mg by mouth twice a day  Senokot for constipation as directed and as needed Dulcolax (bisacodyl), take with full glass of water  Miralax (polyethylene glycol) once or twice a day as needed.  If you have tried all these things and are unable to have a bowel movement in the first 3-4 days after surgery call either your surgeon or your primary doctor.    If you experience loose stools or diarrhea, hold the medications until you stool forms back up.  If your symptoms do not get better within 1 week or if they get worse, check with your doctor.  If you experience "the worst abdominal pain ever" or develop nausea or vomiting, please contact the office immediately for further recommendations for treatment.   ITCHING:  If you experience itching with your medications, try taking only a single pain pill, or even half a pain pill at a time.  You can also use Benadryl over the counter for itching or also to help with sleep.   TED HOSE  STOCKINGS:  Use stockings on both legs until for at least 2 weeks or as directed by physician office. They may be removed at night for sleeping.  MEDICATIONS:  See your medication summary on the "After Visit Summary" that nursing will review with you.  You may have some home medications which will be placed on hold until you complete the course of blood thinner medication.  It is important for you to complete the blood thinner medication as prescribed.  PRECAUTIONS:  If you experience chest pain or shortness of breath - call 911 immediately for transfer to the hospital emergency department.   If you develop a fever greater that 101 F, purulent drainage from wound, increased redness or drainage from wound, foul odor from the wound/dressing, or calf pain - CONTACT YOUR SURGEON.  FOLLOW-UP APPOINTMENTS:  If you do not already have a post-op appointment, please call the office for an appointment to be seen by your surgeon.  Guidelines for how soon to be seen are listed in your "After Visit Summary", but are typically between 1-4 weeks after surgery.  OTHER INSTRUCTIONS:   Knee Replacement:  Do not place pillow under knee, focus on keeping the knee straight while resting. CPM instructions: 0-90 degrees, 2 hours in the morning, 2 hours in the afternoon, and 2 hours in the evening. Place foam block, curve side up under heel at all times except when in CPM or when walking.  DO NOT modify, tear, cut, or change the foam block in any way.  MAKE SURE YOU:  Understand these instructions.  Get help right away if you are not doing well or get worse.    Thank you for letting us be a part of your medical care team.  It is a privilege we respect greatly.  We hope these instructions will help you stay on track for a fast and full recovery!   Do not put a pillow under the knee. Place it under the heel.    Complete by:  As directed    Driving restrictions    Complete  by:  As directed    No driving for 6 weeks   Increase activity slowly as tolerated    Complete by:  As directed    Lifting restrictions    Complete by:  As directed    No lifting for 6 weeks   Partial weight bearing    Complete by:  As directed    % Body Weight:  50%   Laterality:  left   Extremity:  Lower   Patient may shower    Complete by:  As directed    You may shower over your dressing   TED hose    Complete by:  As directed    Use stockings (TED hose) for 3 weeks on left  leg.  You may remove them at night for sleeping.      DISCHARGE MEDICATIONS:   Allergies as of 05/04/2017      Reactions   Tetanus Toxoid Adsorbed Palpitations   As a child      Medication List    STOP taking these medications   MILK THISTLE EXTRACT PO     TAKE these medications   anastrozole 1 MG tablet Commonly known as:  ARIMIDEX Take 1 tablet (1 mg total) by mouth daily.   B-COMPLEX/B-12 SL Place 1 tablet under the tongue daily.   CALCIUM CITRATE CHEWY BITE 500-500 MG-UNIT chewable tablet Generic drug:  calcium citrate-vitamin D Chew 500 mg by mouth 2 (two) times daily.   diphenhydrAMINE 25 mg capsule Commonly known as:  BENADRYL Take 25 mg by mouth at bedtime as needed for sleep.   docusate sodium 100 MG capsule Commonly known as:  COLACE Take 100 mg by mouth at bedtime.   esomeprazole 20 MG capsule Commonly known as:  NEXIUM Take 40 mg by mouth daily before breakfast.   famotidine 20 MG tablet Commonly known as:  PEPCID Take 20 mg by mouth at bedtime.   Fluticasone-Salmeterol 250-50 MCG/DOSE Aepb Commonly known as:  ADVAIR Inhale 1 puff into the lungs 2 (two) times daily as needed (shortness of breath).   IRON 100/C PO Take 1 tablet by mouth daily. Celebrate iron + vit C   Magnesium 250 MG Tabs Take 500 mg by mouth every evening.  MELATONIN PO Take 1 tablet by mouth at bedtime as needed (sleep).   methocarbamol 500 MG tablet Commonly known as:  ROBAXIN Take 1  tablet (500 mg total) by mouth every 8 (eight) hours as needed for muscle spasms.   multivitamin capsule Take 1 capsule by mouth 2 (two) times daily with a meal. Celebrate Bariatric MVI   oxyCODONE 5 MG immediate release tablet Commonly known as:  Oxy IR/ROXICODONE Take 1-2 tablets (5-10 mg total) by mouth every 4 (four) hours as needed for breakthrough pain.   pravastatin 20 MG tablet Commonly known as:  PRAVACHOL Take 20 mg by mouth at bedtime.   PROBIOTIC PO Take 1 capsule by mouth daily.   rivaroxaban 10 MG Tabs tablet Commonly known as:  XARELTO Take 1 tablet (10 mg total) by mouth daily with breakfast.   telmisartan-hydrochlorothiazide 80-25 MG tablet Commonly known as:  MICARDIS HCT Take 0.5 tablets by mouth daily.   Vitamin K2 100 MCG Caps Take 100 mcg by mouth every evening.            Durable Medical Equipment        Start     Ordered   05/02/17 1217  DME Walker rolling  Once    Question:  Patient needs a walker to treat with the following condition  Answer:  S/P total knee replacement using cement, right   05/02/17 1216   05/02/17 1217  DME 3 n 1  Once     05/02/17 1216   05/02/17 1217  DME Bedside commode  Once    Question:  Patient needs a bedside commode to treat with the following condition  Answer:  S/P total knee replacement using cement, right   05/02/17 1216       Discharge Care Instructions        Start     Ordered   05/04/17 0000  Partial weight bearing    Question Answer Comment  % Body Weight 50%   Laterality left   Extremity Lower      05/04/17 1206   05/04/17 0000  Change dressing    Comments:  DO NOT CHANGE YOUR DRESSING.   05/04/17 1206      FOLLOW UP VISIT:   Follow-up Information    [facility].   Specialty:  [place] Why:  A representtive from [facility] will contact you to arrange start date and time for your therapy. Contact information: [address] [place]  [address] [phone]        [name], MD Follow up on 05/17/2017.   Specialty:  Orthopedic Surgery Contact information: [address] [place] [address] [phone]           DISPOSITION:   Home  CONDITION:  Stable   [name]. [place], August [phone]  05/04/2017 12:07 PM

## 2017-05-04 NOTE — H&P (Signed)
Caitlin Fears, MD   Biagio Borg, PA-C [address], [place], [place]  [address]                             [phone]   [place] MRN:  [id number] DOB/SEX:  [birth year]/female  CHIEF COMPLAINT:  Painful left Knee  HISTORY: Patient is a 71 y.o. female presented with a history of pain in the left knee for 2 years. Onset of symptoms was gradual starting 2 years ago with gradually worsening course since that time. Prior procedures on the knee are arthroscopy. Patient has been treated conservatively with over-the-counter NSAIDs and activity modification. Patient currently rates pain in the knee at 8 out of 10 with activity. There is pain at night. present.  They have been previously treated with: NSAIDS: NSAID, with mild improvement  Knee injection with corticosteroid  was performed Knee injection with visco supplementation was performed Medications: NSAID, visco with mild improvement  PAST MEDICAL HISTORY: Patient Active Problem List   Diagnosis Date Noted  . Unilateral primary osteoarthritis, left knee 05/02/2017  . S/P total knee replacement using cement, left 05/02/2017  . Primary osteoarthritis of left knee 12/21/2016  . Chondromalacia patellae, left knee 05/26/2016  . Breast cancer of upper-inner quadrant of right female breast ([place]) 11/04/2015  . GERD (gastroesophageal reflux disease) 09/02/2013  . Obesity 09/02/2013  . Ventral hernia, recurrent 09/02/2013  . Unspecified asthma(493.90) 12/12/2012  . Cough 03/27/2012  . Right middle lobe syndrome 03/27/2012   Past Medical History:  Diagnosis Date  . Arthritis   . Asthma   . Breast cancer ([place])   . Breast disorder    cancer  . Breast mass    benign  . GERD (gastroesophageal reflux disease)   . Heart murmur    AS CHILD  OUTGREW IT  . History of hiatal hernia   . Hyperlipidemia   . Hypertension   . Impaired fasting glucose   . Rectocele   . Varicose vein of leg      Past Surgical History:  Procedure Laterality Date  . BREAST LUMPECTOMY WITH RADIOACTIVE SEED AND SENTINEL LYMPH NODE BIOPSY Right 11/11/2015   Procedure: RIGHT BREAST RADIOACTIVE SEED GUIDED  LUMPECTOMY AND RADIOACTIVE SEED TARGETED SENTINEL LYMPH NODE BIOPSY;  Surgeon: Stark Klein, MD;  Location: [facility];  Service: General;  Laterality: Right;  . BREAST SURGERY Left    breast biopies x4  . CHOLECYSTECTOMY  09/08/2010  . COLONOSCOPY    . COLONOSCOPY N/A 09/25/2013   Procedure: COLONOSCOPY;  Surgeon: Rogene Houston, MD;  [facility];  Service: Endoscopy;  Laterality: N/A;  100-moved to 1200 Ann to notify pt  . HERNIA REPAIR  12/24/2010, 12-2014   TOTAL 4  . LAPAROSCOPIC GASTRIC SLEEVE RESECTION  11-2013   at [facility]  2007/2008  . TONSILLECTOMY  1968  . TOTAL KNEE ARTHROPLASTY Left 05/02/2017  . TOTAL KNEE ARTHROPLASTY Left 05/02/2017   Procedure: LEFT TOTAL KNEE ARTHROPLASTY;  Surgeon: [name], MD;  Location: [facility];  Service: Orthopedics;  Laterality: Left;  . UPPER GASTROINTESTINAL ENDOSCOPY    . VAGINAL HYSTERECTOMY  1980s     MEDICATIONS PRIOR TO ADMISSION:  Current Facility-Administered Medications:  .  alum & mag hydroxide-simeth (MAALOX/MYLANTA) 200-200-20 MG/5ML suspension 30 mL, 30 mL, Oral, Q4H PRN, Cherylann Ratel, PA-C, 30 mL at 05/02/17 1814 .  anastrozole (ARIMIDEX) tablet 1 mg, 1 mg, Oral, Daily, Petrarca, [name], PA-C, 1 mg at 05/04/17 1111 .  B-complex with vitamin C tablet 1 tablet, 1 tablet, Oral, Daily, [name], MD, 1 tablet at 05/04/17 1111 .  bisacodyl (DULCOLAX) suppository 10 mg, 10 mg, Rectal, Daily PRN, Petrarca, [name], PA-C .  Chlorhexidine Gluconate Cloth 2 % PADS 6 each, 6 each, Topical, Q0600, [name], MD, 6 each at 05/03/17 0600 .  diphenhydrAMINE (BENADRYL) 12.5 MG/5ML elixir 12.5-25 mg, 12.5-25 mg, Oral, Q4H PRN, Petrarca, Brian D, PA-C .  docusate  sodium (COLACE) capsule 100 mg, 100 mg, Oral, QHS, Petrarca, [name], PA-C, 100 mg at 05/02/17 2117 .  docusate sodium (COLACE) capsule 100 mg, 100 mg, Oral, BID, Cherylann Ratel, PA-C, 100 mg at 05/04/17 1114 .  famotidine (PEPCID) tablet 20 mg, 20 mg, Oral, QHS, Petrarca, Brian D, PA-C, 20 mg at 05/03/17 2128 .  hydrochlorothiazide (MICROZIDE) capsule 12.5 mg, 12.5 mg, Oral, Daily, [name], MD, 12.5 mg at 05/04/17 1114 .  HYDROmorphone (DILAUDID) injection 0.5 mg, 0.5 mg, Intravenous, Q2H PRN, Cherylann Ratel, PA-C, 0.5 mg at 05/03/17 8101 .  irbesartan (AVAPRO) tablet 300 mg, 300 mg, Oral, Daily, [name], MD, 300 mg at 05/04/17 1113 .  magnesium citrate solution 1 Bottle, 1 Bottle, Oral, Once PRN, Petrarca, Brian D, PA-C .  magnesium oxide (MAG-OX) tablet 200 mg, 200 mg, Oral, Daily, [name], MD, 200 mg at 05/04/17 1115 .  menthol-cetylpyridinium (CEPACOL) lozenge 3 mg, 1 lozenge, Oral, PRN **OR** phenol (CHLORASEPTIC) mouth spray 1 spray, 1 spray, Mouth/Throat, PRN, Petrarca, Brian D, PA-C .  methocarbamol (ROBAXIN) tablet 500 mg, 500 mg, Oral, Q6H PRN, 500 mg at 05/04/17 1031 **OR** methocarbamol (ROBAXIN) 500 mg in dextrose 5 % 50 mL IVPB, 500 mg, Intravenous, Q6H PRN, Petrarca, Brian D, PA-C .  metoCLOPramide (REGLAN) tablet 5-10 mg, 5-10 mg, Oral, Q8H PRN **OR** metoCLOPramide (REGLAN) injection 5-10 mg, 5-10 mg, Intravenous, Q8H PRN, Petrarca, Brian D, PA-C .  mometasone-formoterol (DULERA) 200-5 MCG/ACT inhaler 2 puff, 2 puff, Inhalation, BID, Petrarca, [name], PA-C, 2 puff at 05/04/17 0824 .  mupirocin ointment (BACTROBAN) 2 % 1 application, 1 application, Nasal, BID, [name], MD, 1 application at 75/10/25 1117 .  ondansetron (ZOFRAN) tablet 4 mg, 4 mg, Oral, Q6H PRN, 4 mg at 05/02/17 1402 **OR** ondansetron (ZOFRAN) injection 4 mg, 4 mg, Intravenous, Q6H PRN, Petrarca, Brian D, PA-C .  oxyCODONE (Oxy IR/ROXICODONE) immediate release tablet  5-10 mg, 5-10 mg, Oral, Q3H PRN, Petrarca, Brian D, PA-C, 10 mg at 05/04/17 1030 .  pantoprazole (PROTONIX) EC tablet 40 mg, 40 mg, Oral, Daily, Cherylann Ratel, PA-C, 40 mg at 05/04/17 1114 .  polyethylene glycol (MIRALAX / GLYCOLAX) packet 17 g, 17 g, Oral, Daily PRN, Petrarca, Brian D, PA-C .  pravastatin (PRAVACHOL) tablet 20 mg, 20 mg, Oral, QHS, Petrarca, [name], PA-C, 20 mg at 05/03/17 2129 .  rivaroxaban (XARELTO) tablet 10 mg, 10 mg, Oral, Q breakfast, Petrarca, [name], PA-C, 10 mg at 05/04/17 8527   ALLERGIES:   Allergies  Allergen Reactions  . Tetanus Toxoid Adsorbed Palpitations    As a child    REVIEW OF SYSTEMS:  Review of Systems  All other systems reviewed and are negative.   FAMILY HISTORY:   Family History  Problem Relation Age of Onset  . Colon cancer Father   . Rectal cancer Father   . Prostate cancer  Father   . Dementia Mother   . Osteoporosis Mother   . Healthy Daughter   . Healthy Son   . Pancreatic cancer Paternal Grandfather   . Heart disease Paternal Grandmother   . Heart disease Maternal Grandfather   . Breast cancer Maternal Grandmother   . Scoliosis Sister     SOCIAL HISTORY:   Social History   Occupational History  . retired Retired    Pharmacist, hospital   Social History Main Topics  . Smoking status: Never Smoker  . Smokeless tobacco: Never Used  . Alcohol use No  . Drug use: No  . Sexual activity: Yes    Birth control/ protection: Surgical     Comment: hyst     EXAMINATION:  Vital signs in last 24 hours: BP (!) 113/51 (BP Location: Left Arm)   Pulse 93   Temp 98.5 F (36.9 C) (Oral)   Resp 16   Ht 5\' 3"  (1.6 m)   Wt 198 lb (89.8 kg)   SpO2 95%   BMI 35.07 kg/m   Physical Exam  Constitutional: She is oriented to person, place, and time. She appears well-developed and well-nourished.  HENT:  Head: Normocephalic and atraumatic.  Eyes: Pupils are equal, round, and reactive to light. Conjunctivae and EOM are normal.    Cardiovascular: Intact distal pulses.   Pulmonary/Chest: Effort normal and breath sounds normal.  Abdominal: Bowel sounds are normal. There is no tenderness.  Neurological: She is alert and oriented to person, place, and time.  Skin: Skin is warm and dry.  Psychiatric: She has a normal mood and affect. Her behavior is normal. Judgment and thought content normal.   Ortho Exam   Range of motion 3 to 100. Medial and lateral joint line pain with crepitation patellofemoral. Small effusion. Some superficial varicosities about the knee. No calf pain. No swelling distally.  Imaging Review Plain radiographs demonstrate moderate degenerative joint disease of the left knee. The overall alignment is mild valgus. The bone quality appears to be good for age and reported activity level.  ASSESSMENT: End stage arthritis, left knee  Past Medical History:  Diagnosis Date  . Arthritis   . Asthma   . Breast cancer ([place])   . Breast disorder    cancer  . Breast mass    benign  . GERD (gastroesophageal reflux disease)   . Heart murmur    AS CHILD  OUTGREW IT  . History of hiatal hernia   . Hyperlipidemia   . Hypertension   . Impaired fasting glucose   . Rectocele   . Varicose vein of leg     PLAN: Plan for left total knee replacement.  The patient history, physical examination and imaging studies are consistent with moderate degenerative joint disease of the left knee. The patient has failed conservative treatment.  The clearance notes were reviewed.  After discussion with the patient it was felt that Total Knee Replacement was indicated. The procedure,  risks, and benefits of total knee arthroplasty were presented and reviewed. The risks including but not limited to aseptic loosening, infection, blood clots, vascular and nerve injury, stiffness, patella tracking problems and fracture complications among others were discussed. The patient acknowledged the explanation, agreed to proceed with total  knee replacement.  [name] [place], [place] [phone]  05/04/2017 12:55 PM

## 2017-05-04 NOTE — Care Management Note (Signed)
Case Management Note  Patient Details  Name: Caitlin [name] MRN: [id number] Date of Birth: 01/04/1946  Subjective/Objective:  71 yr old female s/p left total knee arthroplasty.                   Action/Plan: Case manager spoke with patient and her husband concerning discharge and DME. Choice for [facility] was offered, patient requested [facility], Referral was called to Christa See, [facility] Liaison. Patient will have family support at discharge.   Expected Discharge Date:   05/04/17               Expected Discharge Plan:  [place]  In-House Referral:  NA  Discharge planning Services  CM Consult  Post Acute Care Choice:  Durable Medical Equipment, Home Health Choice offered to:  Patient  DME Arranged:  3-N-1, [place] rolling DME Agency:  [place]:  PT [place] Agency:  [facility] ([facility])  Status of Service:  Completed, signed off  If discussed at [employer], dates discussed:    Additional Comments:  Ninfa Meeker, RN 05/04/2017, 11:08 AM

## 2017-05-04 NOTE — Progress Notes (Signed)
Discharge instructions, RX's and follow up appts explained and provided to patient and husband verbalized understanding. Patient left floor via wheelchair accompanied by staff no c/o pain or shortness of breath.  Vaanya Shambaugh, Tivis Ringer, RN

## 2017-05-05 DIAGNOSIS — Z6834 Body mass index (BMI) 34.0-34.9, adult: Secondary | ICD-10-CM | POA: Diagnosis not present

## 2017-05-05 DIAGNOSIS — Z96652 Presence of left artificial knee joint: Secondary | ICD-10-CM | POA: Diagnosis not present

## 2017-05-05 DIAGNOSIS — E669 Obesity, unspecified: Secondary | ICD-10-CM | POA: Diagnosis not present

## 2017-05-05 DIAGNOSIS — C50211 Malignant neoplasm of upper-inner quadrant of right female breast: Secondary | ICD-10-CM | POA: Diagnosis not present

## 2017-05-05 DIAGNOSIS — Z471 Aftercare following joint replacement surgery: Secondary | ICD-10-CM | POA: Diagnosis not present

## 2017-05-05 DIAGNOSIS — J45909 Unspecified asthma, uncomplicated: Secondary | ICD-10-CM | POA: Diagnosis not present

## 2017-05-08 DIAGNOSIS — J45909 Unspecified asthma, uncomplicated: Secondary | ICD-10-CM | POA: Diagnosis not present

## 2017-05-08 DIAGNOSIS — Z471 Aftercare following joint replacement surgery: Secondary | ICD-10-CM | POA: Diagnosis not present

## 2017-05-08 DIAGNOSIS — Z96652 Presence of left artificial knee joint: Secondary | ICD-10-CM | POA: Diagnosis not present

## 2017-05-08 DIAGNOSIS — E669 Obesity, unspecified: Secondary | ICD-10-CM | POA: Diagnosis not present

## 2017-05-08 DIAGNOSIS — Z6834 Body mass index (BMI) 34.0-34.9, adult: Secondary | ICD-10-CM | POA: Diagnosis not present

## 2017-05-08 DIAGNOSIS — C50211 Malignant neoplasm of upper-inner quadrant of right female breast: Secondary | ICD-10-CM | POA: Diagnosis not present

## 2017-05-09 ENCOUNTER — Other Ambulatory Visit (INDEPENDENT_AMBULATORY_CARE_PROVIDER_SITE_OTHER): Payer: Self-pay | Admitting: Orthopedic Surgery

## 2017-05-09 ENCOUNTER — Telehealth (INDEPENDENT_AMBULATORY_CARE_PROVIDER_SITE_OTHER): Payer: Self-pay | Admitting: Orthopaedic Surgery

## 2017-05-09 DIAGNOSIS — Z96652 Presence of left artificial knee joint: Secondary | ICD-10-CM | POA: Diagnosis not present

## 2017-05-09 DIAGNOSIS — E669 Obesity, unspecified: Secondary | ICD-10-CM | POA: Diagnosis not present

## 2017-05-09 DIAGNOSIS — Z6834 Body mass index (BMI) 34.0-34.9, adult: Secondary | ICD-10-CM | POA: Diagnosis not present

## 2017-05-09 DIAGNOSIS — C50211 Malignant neoplasm of upper-inner quadrant of right female breast: Secondary | ICD-10-CM | POA: Diagnosis not present

## 2017-05-09 DIAGNOSIS — J45909 Unspecified asthma, uncomplicated: Secondary | ICD-10-CM | POA: Diagnosis not present

## 2017-05-09 DIAGNOSIS — Z471 Aftercare following joint replacement surgery: Secondary | ICD-10-CM | POA: Diagnosis not present

## 2017-05-09 MED ORDER — OXYCODONE HCL 5 MG PO TABS: 5.0000 mg | ORAL_TABLET | Freq: Four times a day (QID) | ORAL | 0 refills | Status: DC | PRN

## 2017-05-09 NOTE — Telephone Encounter (Signed)
Patient's husband states she runs out of pain medicine tonight and would like a refill please.

## 2017-05-09 NOTE — Telephone Encounter (Signed)
Please advise 

## 2017-05-09 NOTE — Telephone Encounter (Signed)
Prescription for oxyIR given to husband

## 2017-05-10 ENCOUNTER — Ambulatory Visit (INDEPENDENT_AMBULATORY_CARE_PROVIDER_SITE_OTHER): Payer: Medicare Other | Admitting: Orthopedic Surgery

## 2017-05-11 NOTE — Progress Notes (Signed)
Pt cancelled

## 2017-05-12 DIAGNOSIS — Z6834 Body mass index (BMI) 34.0-34.9, adult: Secondary | ICD-10-CM | POA: Diagnosis not present

## 2017-05-12 DIAGNOSIS — E669 Obesity, unspecified: Secondary | ICD-10-CM | POA: Diagnosis not present

## 2017-05-12 DIAGNOSIS — C50211 Malignant neoplasm of upper-inner quadrant of right female breast: Secondary | ICD-10-CM | POA: Diagnosis not present

## 2017-05-12 DIAGNOSIS — Z96652 Presence of left artificial knee joint: Secondary | ICD-10-CM | POA: Diagnosis not present

## 2017-05-12 DIAGNOSIS — Z471 Aftercare following joint replacement surgery: Secondary | ICD-10-CM | POA: Diagnosis not present

## 2017-05-12 DIAGNOSIS — J45909 Unspecified asthma, uncomplicated: Secondary | ICD-10-CM | POA: Diagnosis not present

## 2017-05-15 DIAGNOSIS — Z471 Aftercare following joint replacement surgery: Secondary | ICD-10-CM | POA: Diagnosis not present

## 2017-05-15 DIAGNOSIS — Z96652 Presence of left artificial knee joint: Secondary | ICD-10-CM | POA: Diagnosis not present

## 2017-05-15 DIAGNOSIS — E669 Obesity, unspecified: Secondary | ICD-10-CM | POA: Diagnosis not present

## 2017-05-15 DIAGNOSIS — C50211 Malignant neoplasm of upper-inner quadrant of right female breast: Secondary | ICD-10-CM | POA: Diagnosis not present

## 2017-05-15 DIAGNOSIS — Z6834 Body mass index (BMI) 34.0-34.9, adult: Secondary | ICD-10-CM | POA: Diagnosis not present

## 2017-05-15 DIAGNOSIS — J45909 Unspecified asthma, uncomplicated: Secondary | ICD-10-CM | POA: Diagnosis not present

## 2017-05-17 ENCOUNTER — Ambulatory Visit (INDEPENDENT_AMBULATORY_CARE_PROVIDER_SITE_OTHER): Payer: Medicare Other | Admitting: Orthopaedic Surgery

## 2017-05-17 ENCOUNTER — Ambulatory Visit (INDEPENDENT_AMBULATORY_CARE_PROVIDER_SITE_OTHER): Payer: Medicare Other

## 2017-05-17 DIAGNOSIS — Z96652 Presence of left artificial knee joint: Secondary | ICD-10-CM

## 2017-05-17 DIAGNOSIS — Z471 Aftercare following joint replacement surgery: Secondary | ICD-10-CM | POA: Diagnosis not present

## 2017-05-17 DIAGNOSIS — J45909 Unspecified asthma, uncomplicated: Secondary | ICD-10-CM | POA: Diagnosis not present

## 2017-05-17 DIAGNOSIS — C50211 Malignant neoplasm of upper-inner quadrant of right female breast: Secondary | ICD-10-CM | POA: Diagnosis not present

## 2017-05-17 DIAGNOSIS — Z6834 Body mass index (BMI) 34.0-34.9, adult: Secondary | ICD-10-CM | POA: Diagnosis not present

## 2017-05-17 DIAGNOSIS — E669 Obesity, unspecified: Secondary | ICD-10-CM | POA: Diagnosis not present

## 2017-05-17 NOTE — Progress Notes (Signed)
Office Visit Note   Patient: Caitlin [name]           Date of Birth: 06/01/1946           MRN: [id number] Visit Date: 05/17/2017              Requested by: Sharilyn Sites, [place], [place] [address] PCP: Sharilyn Sites, MD   Assessment & Plan: Visit Diagnoses:  1. Presence of left artificial knee joint   2. History of left knee replacement     Plan: Doing very well with home therapy. Still using CPM machine. Just taking pain medicine at night. Using a cane for ambulation. No fever chills or shortness of breath. Dressing changed. staples removed and Steri-Strips applied. We'll see in 2 weeks. Stop xarelto Follow-Up Instructions: Return in about 2 weeks (around 05/31/2017).   Orders:  Orders Placed This Encounter  Procedures  . XR KNEE 3 VIEW LEFT   No orders of the defined types were placed in this encounter.     Procedures: No procedures performed   Clinical Data: No additional findings.   Subjective: Chief Complaint  Patient presents with  . Left Knee - Routine Post Op    Caitlin [name] is a 71 y o S/P 2 week Left TKA. Bandage changed, incision clean, dry and intact. Steri strips applied. Pt states she is having trouble sleeping.  Doing well at this point. Happy with progress. Still having some pain at night and taking the oxycodone. Not taking it during the day. Has finished her course of home therapy will begin outpatient therapy at [facility] next week  HPI  Review of Systems  Constitutional: Negative for chills, fatigue and fever.  Eyes: Negative for itching.  Respiratory: Negative for chest tightness and shortness of breath.   Cardiovascular: Negative for chest pain, palpitations and leg swelling.  Gastrointestinal: Negative for blood in stool, constipation and diarrhea.  Endocrine: Negative for polyuria.  Genitourinary: Negative for dysuria.  Musculoskeletal: Negative for back pain, joint swelling, neck pain and neck stiffness.    Allergic/Immunologic: Negative for immunocompromised state.  Neurological: Positive for weakness. Negative for dizziness and numbness.  Hematological: Does not bruise/bleed easily.  Psychiatric/Behavioral: Positive for sleep disturbance. The patient is not nervous/anxious.      Objective: Vital Signs: There were no vitals taken for this visit.  Physical Exam  Ortho Exam awake alert and oriented 3. Comfortable sitting. Smiling without shortness of breath or chest pain. Incision looks just fine staples removed and Steri-Strips applied. No calf pain. Multiple varicosities without discomfort. Neurovascular exam intact. Full extension and about 95 of flexion. No instability. No popliteal pain no erythema.  Specialty Comments:  No specialty comments available.  Imaging: Xr Knee 3 View Left  Result Date: 05/17/2017 Films of the right knee obtained in 3 projections. Total knee replacement is in excellent position. I don't see any ectopic calcification slight prominence of the tibial component posteriorly. No evidence of fracture or subluxation. Nice alignment of the components    PMFS History: Patient Active Problem List   Diagnosis Date Noted  . Unilateral primary osteoarthritis, left knee 05/02/2017  . S/P total knee replacement using cement, left 05/02/2017  . Primary osteoarthritis of left knee 12/21/2016  . Chondromalacia patellae, left knee 05/26/2016  . Breast cancer of upper-inner quadrant of right female breast ([place]) 11/04/2015  . GERD (gastroesophageal reflux disease) 09/02/2013  . Obesity 09/02/2013  . Ventral hernia, recurrent 09/02/2013  . Unspecified asthma(493.90) 12/12/2012  .  Cough 03/27/2012  . Right middle lobe syndrome 03/27/2012   Past Medical History:  Diagnosis Date  . Arthritis   . Asthma   . Breast cancer ([place])   . Breast disorder    cancer  . Breast mass    benign  . GERD (gastroesophageal reflux disease)   . Heart murmur    AS CHILD  OUTGREW  IT  . History of hiatal hernia   . Hyperlipidemia   . Hypertension   . Impaired fasting glucose   . Rectocele   . Varicose vein of leg     Family History  Problem Relation Age of Onset  . Colon cancer Father   . Rectal cancer Father   . Prostate cancer Father   . Dementia Mother   . Osteoporosis Mother   . Healthy Daughter   . Healthy Son   . Pancreatic cancer Paternal Grandfather   . Heart disease Paternal Grandmother   . Heart disease Maternal Grandfather   . Breast cancer Maternal Grandmother   . Scoliosis Sister     Past Surgical History:  Procedure Laterality Date  . BREAST LUMPECTOMY WITH RADIOACTIVE SEED AND SENTINEL LYMPH NODE BIOPSY Right 11/11/2015   Procedure: RIGHT BREAST RADIOACTIVE SEED GUIDED  LUMPECTOMY AND RADIOACTIVE SEED TARGETED SENTINEL LYMPH NODE BIOPSY;  Surgeon: Stark Klein, MD;  Location: [facility];  Service: General;  Laterality: Right;  . BREAST SURGERY Left    breast biopies x4  . CHOLECYSTECTOMY  09/08/2010  . COLONOSCOPY    . COLONOSCOPY N/A 09/25/2013   Procedure: COLONOSCOPY;  Surgeon: Rogene Houston, MD;  [facility];  Service: Endoscopy;  Laterality: N/A;  100-moved to 1200 Ann to notify pt  . HERNIA REPAIR  12/24/2010, 12-2014   TOTAL 4  . LAPAROSCOPIC GASTRIC SLEEVE RESECTION  11-2013   at [facility]  2007/2008  . TONSILLECTOMY  1968  . TOTAL KNEE ARTHROPLASTY Left 05/02/2017  . TOTAL KNEE ARTHROPLASTY Left 05/02/2017   Procedure: LEFT TOTAL KNEE ARTHROPLASTY;  Surgeon: [name], MD;  Location: [facility];  Service: Orthopedics;  Laterality: Left;  . UPPER GASTROINTESTINAL ENDOSCOPY    . VAGINAL HYSTERECTOMY  1980s   Social History   Occupational History  . retired Retired    Pharmacist, hospital   Social History Main Topics  . Smoking status: Never Smoker  . Smokeless tobacco: Never Used  . Alcohol use No  . Drug use: No  . Sexual activity: Yes    Birth control/ protection:  Surgical     Comment: hyst

## 2017-05-19 DIAGNOSIS — Z6834 Body mass index (BMI) 34.0-34.9, adult: Secondary | ICD-10-CM | POA: Diagnosis not present

## 2017-05-19 DIAGNOSIS — C50211 Malignant neoplasm of upper-inner quadrant of right female breast: Secondary | ICD-10-CM | POA: Diagnosis not present

## 2017-05-19 DIAGNOSIS — Z471 Aftercare following joint replacement surgery: Secondary | ICD-10-CM | POA: Diagnosis not present

## 2017-05-19 DIAGNOSIS — J45909 Unspecified asthma, uncomplicated: Secondary | ICD-10-CM | POA: Diagnosis not present

## 2017-05-19 DIAGNOSIS — Z96652 Presence of left artificial knee joint: Secondary | ICD-10-CM | POA: Diagnosis not present

## 2017-05-19 DIAGNOSIS — E669 Obesity, unspecified: Secondary | ICD-10-CM | POA: Diagnosis not present

## 2017-05-22 ENCOUNTER — Ambulatory Visit (HOSPITAL_COMMUNITY): Payer: Medicare Other | Attending: Orthopaedic Surgery

## 2017-05-22 ENCOUNTER — Encounter (HOSPITAL_COMMUNITY): Payer: Self-pay

## 2017-05-22 DIAGNOSIS — M6281 Muscle weakness (generalized): Secondary | ICD-10-CM

## 2017-05-22 DIAGNOSIS — M25561 Pain in right knee: Secondary | ICD-10-CM | POA: Insufficient documentation

## 2017-05-22 DIAGNOSIS — M25562 Pain in left knee: Secondary | ICD-10-CM | POA: Insufficient documentation

## 2017-05-22 DIAGNOSIS — M25661 Stiffness of right knee, not elsewhere classified: Secondary | ICD-10-CM | POA: Diagnosis not present

## 2017-05-22 DIAGNOSIS — M25662 Stiffness of left knee, not elsewhere classified: Secondary | ICD-10-CM

## 2017-05-22 DIAGNOSIS — R600 Localized edema: Secondary | ICD-10-CM | POA: Insufficient documentation

## 2017-05-22 NOTE — Addendum Note (Signed)
Addended by: Geralyn Corwin on: 05/22/2017 01:59 PM   Modules accepted: Orders

## 2017-05-22 NOTE — Patient Instructions (Signed)
  QUAD SET  Tighten your top thigh muscle as you attempt to press the back of your knee downward towards the table.  Perform 1-2x/day, 2-3 sets of 10 reps holding for 5-10 seconds   HEEL SLIDES - LONG SIT WITH TOWEL AND BELT  While in a sitting position, place a small hand towel under your heel. Next, loop a belt, towel or bed sheet around your foot and pull your knee into a bend position as your foot slides towards your buttock. Hold a gentle stretch and then return back to original position.  Perform 1-2x/day, 2-3 sets of 10 holding for 5-10 seconds at end range   Knee Flexion Stretch on Step  Place foot on step and lean forward until you feel a good stretch in front of knee.   Perform 1-2x/day, 2-3 sets of 10 holding for 5-10 seconds each   Gastrocnemius Stretch Off Step  Standing with the ball of your foot on a step or a stoop with your leg and knee straight, allow your heel to slowly lower down off the step until you feel a stretch on the back of your calf. Hold this stretch for 30 seconds.  Perform 1-2x/day, 3-5 stretches holding for 30-60 seconds

## 2017-05-22 NOTE — Therapy (Addendum)
[place], [place], [address] Phone: [phone]   Fax:  [phone]  Physical Therapy Evaluation  Patient Details  Name: Caitlin [name] MRN: [id number] Date of Birth: 12-15-1945 Referring Provider: Joni Fears, MD  Encounter Date: 05/22/2017      PT End of Session - 05/22/17 1204    Visit Number 1   Number of Visits 18   Date for PT Re-Evaluation 06/12/17   Authorization Type Medicare Part A and B   Authorization Time Period 05/22/17 to 07/03/17   Authorization - Visit Number 1   Authorization - Number of Visits 10   PT Start Time 2585   PT Stop Time 2778   PT Time Calculation (min) 40 min   Activity Tolerance Patient tolerated treatment well;No increased pain   Behavior During Therapy WFL for tasks assessed/performed      Past Medical History:  Diagnosis Date  . Arthritis   . Asthma   . Breast cancer ([place])   . Breast disorder    cancer  . Breast mass    benign  . GERD (gastroesophageal reflux disease)   . Heart murmur    AS CHILD  OUTGREW IT  . History of hiatal hernia   . Hyperlipidemia   . Hypertension   . Impaired fasting glucose   . Rectocele   . Varicose vein of leg     Past Surgical History:  Procedure Laterality Date  . BREAST LUMPECTOMY WITH RADIOACTIVE SEED AND SENTINEL LYMPH NODE BIOPSY Right 11/11/2015   Procedure: RIGHT BREAST RADIOACTIVE SEED GUIDED  LUMPECTOMY AND RADIOACTIVE SEED TARGETED SENTINEL LYMPH NODE BIOPSY;  Surgeon: Stark Klein, MD;  Location: [facility];  Service: General;  Laterality: Right;  . BREAST SURGERY Left    breast biopies x4  . CHOLECYSTECTOMY  09/08/2010  . COLONOSCOPY    . COLONOSCOPY N/A 09/25/2013   Procedure: COLONOSCOPY;  Surgeon: Rogene Houston, MD;  [facility];  Service: Endoscopy;  Laterality: N/A;  100-moved to 1200 Ann to notify pt  . HERNIA REPAIR  12/24/2010, 12-2014   TOTAL 4  . LAPAROSCOPIC GASTRIC SLEEVE  RESECTION  11-2013   at [facility]  2007/2008  . TONSILLECTOMY  1968  . TOTAL KNEE ARTHROPLASTY Left 05/02/2017  . TOTAL KNEE ARTHROPLASTY Left 05/02/2017   Procedure: LEFT TOTAL KNEE ARTHROPLASTY;  Surgeon: [name], MD;  Location: [facility];  Service: Orthopedics;  Laterality: Left;  . UPPER GASTROINTESTINAL ENDOSCOPY    . VAGINAL HYSTERECTOMY  1980s    There were no vitals filed for this visit.       Subjective Assessment - 05/22/17 1122    Subjective Pt states that she had a L TKA on 05/02/17. She participated in [facility] and was completely discharged from it on 05/19/17. She states that she had 2 complex meniscus tears surgically repaired last year by Dr. Durward Fortes, however, she had too much arthritis still and so she went ahead with the L TKA. Prior to her knee pain increasing just before surgery, she did not use an AD for ambulation. She has the most difficulty bending it back. Staying in one position and bedtime at night aggravate her pain. Staying moving helps her pain.   Limitations House hold activities   How long can you sit comfortably? no issues   How long can you stand comfortably? about 1-1.5 hours   How long can you walk comfortably?  unsure, walked to mailbox and back and then around house   Patient Stated Goals full motion of the knee   Currently in Pain? No/denies            [facility] PT Assessment - 05/22/17 0001      Assessment   Medical Diagnosis L TKA   Referring Provider Joni Fears, MD   Onset Date/Surgical Date 05/02/17   Next MD Visit 05/31/17   Prior Therapy d/c from [facility]; no prior PT for the knee     Precautions   Precautions None     Restrictions   Weight Bearing Restrictions No     Balance Screen   Has the patient fallen in the past 6 months No   Has the patient had a decrease in activity level because of a fear of falling?  No     Prior Function   Level of Independence Independent   Vocation Retired    Leisure try to see grandkids     Observation/Other Assessments-Edema    Edema Circumferential     Circumferential Edema   Circumferential - Right 16.5" joint line   Circumferential - Left  18.25" joint line     ROM / Strength   AROM / PROM / Strength AROM;Strength     AROM   AROM Assessment Site Knee   Right/Left Knee Left   Left Knee Extension 7   Left Knee Flexion 105     Strength   Strength Assessment Site Hip;Knee;Ankle   Right Hip Flexion 5/5   Right Hip Extension 4-/5   Right Hip ABduction 4-/5   Left Hip Flexion 4+/5   Left Hip Extension 4-/5   Left Hip ABduction 4-/5   Right Knee Flexion 5/5   Right Knee Extension 5/5   Left Knee Flexion 4/5   Left Knee Extension 5/5   Right Ankle Dorsiflexion 5/5   Left Ankle Dorsiflexion 4+/5     Palpation   Patella mobility hypomobile throughout     Ambulation/Gait   Ambulation Distance (Feet) 610 Feet  3MWT     Balance   Balance Assessed Yes     Static Standing Balance   Static Standing - Balance Support No upper extremity supported   Static Standing Balance -  Activities  Single Leg Stance - Left Leg;Single Leg Stance - Right Leg   Static Standing - Comment/# of Minutes R: 4 sec or <; L: 7 sec     Standardized Balance Assessment   Standardized Balance Assessment Five Times Sit to Stand;Timed Up and Go Test   Five times sit to stand comments  9 sec, no UE, decreased weight shift over LLE         Objective measurements completed on examination: See above findings.          PT Education - 05/22/17 1204    Education provided Yes   Education Details exam findings, POC, HEP   Person(s) Educated Patient   Methods Explanation;Demonstration;Handout   Comprehension Verbalized understanding;Returned demonstration          PT Short Term Goals - 05/22/17 1215      PT SHORT TERM GOAL #1   Title Pt will be independent with HEP and perform consistently to maximzie return to PLOF.   Time 3   Period Weeks    Status New   Target Date 06/12/17     PT SHORT TERM GOAL #2   Title Pt will have decreased L knee edema by 1" or > to  maximize ROM and decrease pain.   Time 3   Period Weeks   Status New     PT SHORT TERM GOAL #3   Title Pt will have improved AROM to at least 0-115 to maximize gait and stair ambulation.   Time 3   Period Weeks   Status New     PT SHORT TERM GOAL #4   Title Pt will be able to perform SLS for 10 sec or > on BLE in order to maximize gait on uneven ground.   Time 3   Period Weeks   Status New           PT Long Term Goals - 05/22/17 1217      PT LONG TERM GOAL #1   Title Pt will have improved MMT of all muscle groups tested to 5/5 in order to maximize gait and balance.    Time 6   Period Weeks   Status New   Target Date 07/03/17     PT LONG TERM GOAL #2   Title Pt will have improved 3MWT by 197ft or greater with min to no gait deviations in order to maximize community access.    Time 6   Period Weeks   Status New     PT LONG TERM GOAL #3   Title Pt will be able to ascend/descend a full flight of stairs reciprocally with 1 to no handrail and demonstrate proper mechanics with good eccentric control in order to show improved functional strength and ROM of the L knee so pt can access her upstairs with greater ease.   Time 6   Period Weeks   Status New     PT LONG TERM GOAL #4   Title Pt will report being able to sleep through the night without awakening due to pain in order to maximize overall recovery.   Time 6   Period Weeks   Status New                Plan - 05/22/17 1207    Clinical Impression Statement Pt is pleasant 71 YO F who presents to OPPT s/p L TKA on 05/02/17. Pt currently presents with deficits in AROM, gait, MMT, and balance, as well as increased edema, patellar hypomobility, soft tissue restrictions and pain. Pt needs skilled PT intervention to address these deficits in order to promote return to PLOF. Recommending 3x/week for 6  weeks, though anticipate decreasing frequency to 2x/week at reassessment due to pt's current functional level.    History and Personal Factors relevant to plan of care: motivated, relatively active, HTN,  h/o breast cancer, hyperlipidemia, asthma, arthritis   Clinical Presentation Stable   Clinical Presentation due to: MMT, AROM, gait, SLS, 3MWT, 5xSTS, soft tissue restrictions, patellar mobility, pain   Clinical Decision Making Low   Rehab Potential Good   PT Frequency 3x / week   PT Duration 6 weeks   PT Treatment/Interventions Cryotherapy;Electrical Stimulation;Moist Heat;DME Instruction;Gait training;Stair training;Functional mobility training;Therapeutic activities;Therapeutic exercise;Balance training;Neuromuscular re-education;Patient/family education;Manual techniques;Scar mobilization;Passive range of motion;Dry needling;Energy conservation;Taping   PT Next Visit Plan review goals and eval, manual for edema, soft tissue restrictions, ROM; ROM work, gait training, balance   PT Home Exercise Plan eval: quad sets, heel slides, knee drives on step, standing calf stretch on step   Consulted and Agree with Plan of Care Patient      Patient will benefit from skilled therapeutic intervention in order to improve the following deficits and impairments:  Abnormal gait,  Decreased activity tolerance, Decreased balance, Decreased endurance, Decreased mobility, Decreased range of motion, Decreased strength, Difficulty walking, Hypomobility, Increased edema, Increased fascial restricitons, Increased muscle spasms, Impaired flexibility, Improper body mechanics, Pain  Visit Diagnosis: Acute pain of left knee  Stiffness of left knee, not elsewhere classified  Localized edema  Muscle weakness (generalized)        G-Codes - 2017/06/06 1223    Functional Assessment Tool Used (Outpatient Only) clinical judgement, 5xSTS, 3MWT, SLS, MMT, ROM, gait   Functional Limitation Mobility: Walking and moving  around   Mobility: Walking and Moving Around Current Status [phone]) At least 40 percent but less than 60 percent impaired, limited or restricted   Mobility: Walking and Moving Around Goal Status [phone]) At least 1 percent but less than 20 percent impaired, limited or restricted       Problem List Patient Active Problem List   Diagnosis Date Noted  . Unilateral primary osteoarthritis, left knee 05/02/2017  . S/P total knee replacement using cement, left 05/02/2017  . Primary osteoarthritis of left knee 12/21/2016  . Chondromalacia patellae, left knee 05/26/2016  . Breast cancer of upper-inner quadrant of right female breast ([place]) 11/04/2015  . GERD (gastroesophageal reflux disease) 09/02/2013  . Obesity 09/02/2013  . Ventral hernia, recurrent 09/02/2013  . Unspecified asthma(493.90) 12/12/2012  . Cough 03/27/2012  . Right middle lobe syndrome 03/27/2012       Geraldine Solar PT, DPT  [facility] [address] [place], [place], [address] Phone: [phone]   Fax:  [phone]  Name: Caitlin [name] MRN: [id number] Date of Birth: 1946/05/28

## 2017-05-23 ENCOUNTER — Encounter (HOSPITAL_COMMUNITY): Payer: Self-pay | Admitting: Physical Therapy

## 2017-05-23 ENCOUNTER — Ambulatory Visit (HOSPITAL_COMMUNITY): Payer: Medicare Other | Admitting: Physical Therapy

## 2017-05-23 DIAGNOSIS — M25561 Pain in right knee: Secondary | ICD-10-CM | POA: Diagnosis not present

## 2017-05-23 DIAGNOSIS — M6281 Muscle weakness (generalized): Secondary | ICD-10-CM | POA: Diagnosis not present

## 2017-05-23 DIAGNOSIS — M25662 Stiffness of left knee, not elsewhere classified: Secondary | ICD-10-CM | POA: Diagnosis not present

## 2017-05-23 DIAGNOSIS — M25661 Stiffness of right knee, not elsewhere classified: Secondary | ICD-10-CM | POA: Diagnosis not present

## 2017-05-23 DIAGNOSIS — R600 Localized edema: Secondary | ICD-10-CM | POA: Diagnosis not present

## 2017-05-23 DIAGNOSIS — M25562 Pain in left knee: Secondary | ICD-10-CM

## 2017-05-23 NOTE — Therapy (Signed)
[place], [place], [address] Phone: [phone]   Fax:  [phone]  Physical Therapy Treatment  Patient Details  Name: Caitlin [name] MRN: [id number] Date of Birth: 20-Feb-1946 Referring Provider: Joni Fears, MD  Encounter Date: 05/23/2017      PT End of Session - 05/23/17 1113    Visit Number 2   Number of Visits 18   Date for PT Re-Evaluation 06/12/17   Authorization Type Medicare Part A and B   Authorization Time Period 05/22/17 to 07/03/17   Authorization - Visit Number 2   Authorization - Number of Visits 10   PT Start Time 1035   PT Stop Time 1113   PT Time Calculation (min) 38 min   Activity Tolerance Patient tolerated treatment well   Behavior During Therapy [facility] for tasks assessed/performed      Past Medical History:  Diagnosis Date  . Arthritis   . Asthma   . Breast cancer ([place])   . Breast disorder    cancer  . Breast mass    benign  . GERD (gastroesophageal reflux disease)   . Heart murmur    AS CHILD  OUTGREW IT  . History of hiatal hernia   . Hyperlipidemia   . Hypertension   . Impaired fasting glucose   . Rectocele   . Varicose vein of leg     Past Surgical History:  Procedure Laterality Date  . BREAST LUMPECTOMY WITH RADIOACTIVE SEED AND SENTINEL LYMPH NODE BIOPSY Right 11/11/2015   Procedure: RIGHT BREAST RADIOACTIVE SEED GUIDED  LUMPECTOMY AND RADIOACTIVE SEED TARGETED SENTINEL LYMPH NODE BIOPSY;  Surgeon: Stark Klein, MD;  Location: [facility];  Service: General;  Laterality: Right;  . BREAST SURGERY Left    breast biopies x4  . CHOLECYSTECTOMY  09/08/2010  . COLONOSCOPY    . COLONOSCOPY N/A 09/25/2013   Procedure: COLONOSCOPY;  Surgeon: Rogene Houston, MD;  [facility];  Service: Endoscopy;  Laterality: N/A;  100-moved to 1200 Ann to notify pt  . HERNIA REPAIR  12/24/2010, 12-2014   TOTAL 4  . LAPAROSCOPIC GASTRIC SLEEVE RESECTION  11-2013    at [facility]  2007/2008  . TONSILLECTOMY  1968  . TOTAL KNEE ARTHROPLASTY Left 05/02/2017  . TOTAL KNEE ARTHROPLASTY Left 05/02/2017   Procedure: LEFT TOTAL KNEE ARTHROPLASTY;  Surgeon: [name], MD;  Location: [facility];  Service: Orthopedics;  Laterality: Left;  . UPPER GASTROINTESTINAL ENDOSCOPY    . VAGINAL HYSTERECTOMY  1980s    There were no vitals filed for this visit.      Subjective Assessment - 05/23/17 1036    Subjective patient arrives stating she is doing well, she is a little sore but looking forward to PT today    Patient Stated Goals full motion of the knee   Currently in Pain? Yes   Pain Score 3    Pain Location Knee   Pain Orientation Left   Pain Descriptors / Indicators Aching;Other (Comment)  "raw feeling"    Pain Type Surgical pain   Pain Radiating Towards none    Pain Onset 1 to 4 weeks ago   Pain Frequency Intermittent   Aggravating Factors  bending knee    Pain Relieving Factors walking, motion    Effect of Pain on Daily Activities mild  [facility] Adult PT Treatment/Exercise - 05/23/17 0001      Exercises   Exercises Knee/Hip     Knee/Hip Exercises: Stretches   Active Hamstring Stretch Left;3 reps;30 seconds   Active Hamstring Stretch Limitations 12 inch box    Quad Stretch Left;3 reps;30 seconds   Quad Stretch Limitations prone    Knee: Self-Stretch to increase Flexion Left;10 seconds   Knee: Self-Stretch Limitations 10 reps    Gastroc Stretch Both;3 reps;30 seconds     Knee/Hip Exercises: Supine   Quad Sets Left;1 set;15 reps   Quad Sets Limitations 3 second holds    Heel Slides Left;1 set;15 reps   Heel Slides Limitations 3 second holds      Manual Therapy   Manual Therapy Edema management;Joint mobilization;Soft tissue mobilization   Manual therapy comments performed separatley from all other skilled services    Edema Management retrograde massage LE elevated     Joint Mobilization patella mobility all directions   Soft tissue mobilization scar massage all directions              Balance Exercises - 05/23/17 1105      Balance Exercises: Standing   Tandem Stance Eyes open;3 reps;15 secs   SLS Eyes open;Solid surface;3 reps;15 secs           PT Education - 05/23/17 1113    Education provided Yes   Education Details review of initial eval/goals    Person(s) Educated Patient   Methods Explanation;Handout   Comprehension Verbalized understanding          PT Short Term Goals - 05/22/17 1215      PT SHORT TERM GOAL #1   Title Pt will be independent with HEP and perform consistently to maximzie return to PLOF.   Time 3   Period Weeks   Status New   Target Date 06/12/17     PT SHORT TERM GOAL #2   Title Pt will have decreased L knee edema by 1" or > to maximize ROM and decrease pain.   Time 3   Period Weeks   Status New     PT SHORT TERM GOAL #3   Title Pt will have improved AROM to at least 0-115 to maximize gait and stair ambulation.   Time 3   Period Weeks   Status New     PT SHORT TERM GOAL #4   Title Pt will be able to perform SLS for 10 sec or > on BLE in order to maximize gait on uneven ground.   Time 3   Period Weeks   Status New           PT Long Term Goals - 05/22/17 1217      PT LONG TERM GOAL #1   Title Pt will have improved MMT of all muscle groups tested to 5/5 in order to maximize gait and balance.    Time 6   Period Weeks   Status New   Target Date 07/03/17     PT LONG TERM GOAL #2   Title Pt will have improved 3MWT by 160ft or greater with min to no gait deviations in order to maximize community access.    Time 6   Period Weeks   Status New     PT LONG TERM GOAL #3   Title Pt will be able to ascend/descend a full flight of stairs reciprocally with 1 to no handrail and demonstrate proper mechanics with good eccentric control in order to show  improved functional strength and ROM of the  L knee so pt can access her upstairs with greater ease.   Time 6   Period Weeks   Status New     PT LONG TERM GOAL #4   Title Pt will report being able to sleep through the night without awakening due to pain in order to maximize overall recovery.   Time 6   Period Weeks   Status New               Plan - 05/23/17 1113    Clinical Impression Statement  Reviewed initial eval/goals followed by quick disclosure. Otherwise began session with manual interventions to improve knee mobility and edema, followed by functional stretching and exercises for ROM as well as pre-gait tasks. Patient very pleasant and remains motivated to participate with skilled PT services.    Rehab Potential Good   PT Frequency 3x / week   PT Duration 6 weeks   PT Treatment/Interventions Cryotherapy;Electrical Stimulation;Moist Heat;DME Instruction;Gait training;Stair training;Functional mobility training;Therapeutic activities;Therapeutic exercise;Balance training;Neuromuscular re-education;Patient/family education;Manual techniques;Scar mobilization;Passive range of motion;Dry needling;Energy conservation;Taping   PT Next Visit Plan manual for edema, soft tissue restrictions, ROM; ROM work, gait training, balance   PT Home Exercise Plan eval: quad sets, heel slides, knee drives on step, standing calf stretch on step   Consulted and Agree with Plan of Care Patient      Patient will benefit from skilled therapeutic intervention in order to improve the following deficits and impairments:  Abnormal gait, Decreased activity tolerance, Decreased balance, Decreased endurance, Decreased mobility, Decreased range of motion, Decreased strength, Difficulty walking, Hypomobility, Increased edema, Increased fascial restricitons, Increased muscle spasms, Impaired flexibility, Improper body mechanics, Pain  Visit Diagnosis: Acute pain of left knee  Stiffness of left knee, not elsewhere classified  Localized  edema  Muscle weakness (generalized)   Problem List Patient Active Problem List   Diagnosis Date Noted  . Unilateral primary osteoarthritis, left knee 05/02/2017  . S/P total knee replacement using cement, left 05/02/2017  . Primary osteoarthritis of left knee 12/21/2016  . Chondromalacia patellae, left knee 05/26/2016  . Breast cancer of upper-inner quadrant of right female breast ([place]) 11/04/2015  . GERD (gastroesophageal reflux disease) 09/02/2013  . Obesity 09/02/2013  . Ventral hernia, recurrent 09/02/2013  . Unspecified asthma(493.90) 12/12/2012  . Cough 03/27/2012  . Right middle lobe syndrome 03/27/2012    Deniece Ree PT, DPT [facility] [address] [place], [place], [address] Phone: [phone]   Fax:  [phone]  Name: Caitlin [name] MRN: [id number] Date of Birth: 14-Dec-1945

## 2017-05-26 ENCOUNTER — Telehealth (HOSPITAL_COMMUNITY): Payer: Self-pay | Admitting: Family Medicine

## 2017-05-26 ENCOUNTER — Ambulatory Visit (HOSPITAL_COMMUNITY): Payer: Medicare Other

## 2017-05-26 NOTE — Telephone Encounter (Signed)
05/26/17  husband called to cx.... said she was running a fever of 102

## 2017-05-29 ENCOUNTER — Telehealth (HOSPITAL_COMMUNITY): Payer: Self-pay | Admitting: Family Medicine

## 2017-05-29 ENCOUNTER — Ambulatory Visit (HOSPITAL_COMMUNITY): Payer: Medicare Other | Admitting: Physical Therapy

## 2017-05-29 DIAGNOSIS — R509 Fever, unspecified: Secondary | ICD-10-CM | POA: Diagnosis not present

## 2017-05-29 DIAGNOSIS — N342 Other urethritis: Secondary | ICD-10-CM | POA: Diagnosis not present

## 2017-05-29 DIAGNOSIS — Z6834 Body mass index (BMI) 34.0-34.9, adult: Secondary | ICD-10-CM | POA: Diagnosis not present

## 2017-05-29 DIAGNOSIS — E6609 Other obesity due to excess calories: Secondary | ICD-10-CM | POA: Diagnosis not present

## 2017-05-29 NOTE — Telephone Encounter (Signed)
05/29/17  pt still running a fever and going to the dr.

## 2017-05-31 ENCOUNTER — Ambulatory Visit (INDEPENDENT_AMBULATORY_CARE_PROVIDER_SITE_OTHER): Payer: Medicare Other | Admitting: Orthopaedic Surgery

## 2017-05-31 ENCOUNTER — Encounter (INDEPENDENT_AMBULATORY_CARE_PROVIDER_SITE_OTHER): Payer: Self-pay | Admitting: Orthopaedic Surgery

## 2017-05-31 ENCOUNTER — Encounter (HOSPITAL_COMMUNITY): Payer: Self-pay

## 2017-05-31 ENCOUNTER — Ambulatory Visit (HOSPITAL_COMMUNITY): Payer: Medicare Other

## 2017-05-31 VITALS — Resp 14 | Ht 64.0 in | Wt 198.0 lb

## 2017-05-31 DIAGNOSIS — M25662 Stiffness of left knee, not elsewhere classified: Secondary | ICD-10-CM

## 2017-05-31 DIAGNOSIS — M6281 Muscle weakness (generalized): Secondary | ICD-10-CM

## 2017-05-31 DIAGNOSIS — Z96652 Presence of left artificial knee joint: Secondary | ICD-10-CM

## 2017-05-31 DIAGNOSIS — M25561 Pain in right knee: Secondary | ICD-10-CM | POA: Diagnosis not present

## 2017-05-31 DIAGNOSIS — R600 Localized edema: Secondary | ICD-10-CM | POA: Diagnosis not present

## 2017-05-31 DIAGNOSIS — M25661 Stiffness of right knee, not elsewhere classified: Secondary | ICD-10-CM | POA: Diagnosis not present

## 2017-05-31 DIAGNOSIS — M25562 Pain in left knee: Secondary | ICD-10-CM

## 2017-05-31 NOTE — Progress Notes (Signed)
Office Visit Note   Patient: Caitlin [name]           Date of Birth: 11-Feb-1946           MRN: [id number] Visit Date: 05/31/2017              Requested by: Sharilyn Sites, [place], [place] [address] PCP: Sharilyn Sites, MD   Assessment & Plan: Visit Diagnoses:  1. History of left knee replacement   One month post left total knee replacement doing very well  Plan: Office 1 month. Continue with exercises.  Follow-Up Instructions: Return in about 1 month (around 06/30/2017).   Orders:  No orders of the defined types were placed in this encounter.  No orders of the defined types were placed in this encounter.     Procedures: No procedures performed   Clinical Data: No additional findings.   Subjective: Chief Complaint  Patient presents with  . Left Knee - Routine Post Op    Caitlin [name] is a 71 y o S/P 1 month Left TKA. She relates she removed a stitch at the top of the scar. Some redness and swelling  's is [place] is very happy with her present course. She no longer uses a cane or any ambulatory aid. Taking very little pain medicine. She did develop a urinary tract infection over this past weekend and presently is on Cipro per her primary care physician. She's not having much discomfort at all with her left knee and happy with her progress physical therapy. She notes she's had up to 115 of flexion. Denies shortness of breath ,chest pain  HPI  Review of Systems  Constitutional: Positive for fever. Negative for chills and fatigue.  Eyes: Negative for itching.  Respiratory: Negative for chest tightness and shortness of breath.   Cardiovascular: Positive for leg swelling. Negative for chest pain and palpitations.  Gastrointestinal: Negative for blood in stool, constipation and diarrhea.  Endocrine: Negative for polyuria.  Genitourinary: Negative for dysuria.  Musculoskeletal: Negative for back pain, joint swelling, neck pain and neck stiffness.    Allergic/Immunologic: Negative for immunocompromised state.  Neurological: Positive for weakness. Negative for dizziness and numbness.  Hematological: Does not bruise/bleed easily.  Psychiatric/Behavioral: The patient is not nervous/anxious.      Objective: Vital Signs: Resp 14   Ht 5\' 4"  (1.626 m)   Wt 198 lb (89.8 kg)   BMI 33.99 kg/m   Physical Exam  Ortho Exam awake alert and oriented 3. Comfortable sitting. Full extension left knee I measured 105 with a goniometer. Incision healing very nicely. He was swollen compared to the right but very minimal effusion. No calf pain. Neurovascular exam intact. No instability. Excellent alignment  Specialty Comments:  No specialty comments available.  Imaging: No results found.   PMFS History: Patient Active Problem List   Diagnosis Date Noted  . Unilateral primary osteoarthritis, left knee 05/02/2017  . S/P total knee replacement using cement, left 05/02/2017  . Primary osteoarthritis of left knee 12/21/2016  . Chondromalacia patellae, left knee 05/26/2016  . Breast cancer of upper-inner quadrant of right female breast ([place]) 11/04/2015  . GERD (gastroesophageal reflux disease) 09/02/2013  . Obesity 09/02/2013  . Ventral hernia, recurrent 09/02/2013  . Unspecified asthma(493.90) 12/12/2012  . Cough 03/27/2012  . Right middle lobe syndrome 03/27/2012   Past Medical History:  Diagnosis Date  . Arthritis   . Asthma   . Breast cancer ([place])   . Breast disorder  cancer  . Breast mass    benign  . GERD (gastroesophageal reflux disease)   . Heart murmur    AS CHILD  OUTGREW IT  . History of hiatal hernia   . Hyperlipidemia   . Hypertension   . Impaired fasting glucose   . Rectocele   . Varicose vein of leg     Family History  Problem Relation Age of Onset  . Colon cancer Father   . Rectal cancer Father   . Prostate cancer Father   . Dementia Mother   . Osteoporosis Mother   . Healthy Daughter   . Healthy Son    . Pancreatic cancer Paternal Grandfather   . Heart disease Paternal Grandmother   . Heart disease Maternal Grandfather   . Breast cancer Maternal Grandmother   . Scoliosis Sister     Past Surgical History:  Procedure Laterality Date  . BREAST LUMPECTOMY WITH RADIOACTIVE SEED AND SENTINEL LYMPH NODE BIOPSY Right 11/11/2015   Procedure: RIGHT BREAST RADIOACTIVE SEED GUIDED  LUMPECTOMY AND RADIOACTIVE SEED TARGETED SENTINEL LYMPH NODE BIOPSY;  Surgeon: Stark Klein, MD;  Location: [facility];  Service: General;  Laterality: Right;  . BREAST SURGERY Left    breast biopies x4  . CHOLECYSTECTOMY  09/08/2010  . COLONOSCOPY    . COLONOSCOPY N/A 09/25/2013   Procedure: COLONOSCOPY;  Surgeon: Rogene Houston, MD;  [facility];  Service: Endoscopy;  Laterality: N/A;  100-moved to 1200 Ann to notify pt  . HERNIA REPAIR  12/24/2010, 12-2014   TOTAL 4  . LAPAROSCOPIC GASTRIC SLEEVE RESECTION  11-2013   at [facility]  2007/2008  . TONSILLECTOMY  1968  . TOTAL KNEE ARTHROPLASTY Left 05/02/2017  . TOTAL KNEE ARTHROPLASTY Left 05/02/2017   Procedure: LEFT TOTAL KNEE ARTHROPLASTY;  Surgeon: [name], MD;  Location: [facility];  Service: Orthopedics;  Laterality: Left;  . UPPER GASTROINTESTINAL ENDOSCOPY    . VAGINAL HYSTERECTOMY  1980s   Social History   Occupational History  . retired Retired    Pharmacist, hospital   Social History Main Topics  . Smoking status: Never Smoker  . Smokeless tobacco: Never Used  . Alcohol use No  . Drug use: No  . Sexual activity: Yes    Birth control/ protection: Surgical     Comment: hyst

## 2017-05-31 NOTE — Therapy (Signed)
[facility] [place], [place], [address] Phone: [phone]   Fax:  [phone]  Physical Therapy Treatment  Patient Details  Name: Caitlin [name] MRN: [id number] Date of Birth: April 16, 1946 Referring Provider: Joni Fears, MD  Encounter Date: 05/31/2017      PT End of Session - 05/31/17 1453    Visit Number 3   Number of Visits 18   Date for PT Re-Evaluation 06/12/17   Authorization Type Medicare Part A and B   Authorization Time Period 05/22/17 to 07/03/17   Authorization - Visit Number 3   Authorization - Number of Visits 10   PT Start Time 1324   PT Stop Time 4010   PT Time Calculation (min) 39 min   Activity Tolerance Patient tolerated treatment well;No increased pain   Behavior During Therapy WFL for tasks assessed/performed      Past Medical History:  Diagnosis Date  . Arthritis   . Asthma   . Breast cancer ([place])   . Breast disorder    cancer  . Breast mass    benign  . GERD (gastroesophageal reflux disease)   . Heart murmur    AS CHILD  OUTGREW IT  . History of hiatal hernia   . Hyperlipidemia   . Hypertension   . Impaired fasting glucose   . Rectocele   . Varicose vein of leg     Past Surgical History:  Procedure Laterality Date  . BREAST LUMPECTOMY WITH RADIOACTIVE SEED AND SENTINEL LYMPH NODE BIOPSY Right 11/11/2015   Procedure: RIGHT BREAST RADIOACTIVE SEED GUIDED  LUMPECTOMY AND RADIOACTIVE SEED TARGETED SENTINEL LYMPH NODE BIOPSY;  Surgeon: Stark Klein, MD;  Location: [facility];  Service: General;  Laterality: Right;  . BREAST SURGERY Left    breast biopies x4  . CHOLECYSTECTOMY  09/08/2010  . COLONOSCOPY    . COLONOSCOPY N/A 09/25/2013   Procedure: COLONOSCOPY;  Surgeon: Rogene Houston, MD;  [facility];  Service: Endoscopy;  Laterality: N/A;  100-moved to 1200 Ann to notify pt  . HERNIA REPAIR  12/24/2010, 12-2014   TOTAL 4  . LAPAROSCOPIC GASTRIC SLEEVE  RESECTION  11-2013   at [facility]  2007/2008  . TONSILLECTOMY  1968  . TOTAL KNEE ARTHROPLASTY Left 05/02/2017  . TOTAL KNEE ARTHROPLASTY Left 05/02/2017   Procedure: LEFT TOTAL KNEE ARTHROPLASTY;  Surgeon: [name], MD;  Location: [facility];  Service: Orthopedics;  Laterality: Left;  . UPPER GASTROINTESTINAL ENDOSCOPY    . VAGINAL HYSTERECTOMY  1980s    There were no vitals filed for this visit.      Subjective Assessment - 05/31/17 1436    Subjective Pt reports she went to MD earlier today and reports he is happy with progress.  Reports she had to cancel 2 apts thinking she had flu, has been tested and ended up with UTI, on medication.  No reports of knee pain today.     Patient Stated Goals full motion of the knee   Currently in Pain? No/denies                         [facility] Adult PT Treatment/Exercise - 05/31/17 0001      Knee/Hip Exercises: Stretches   Active Hamstring Stretch Left;3 reps;30 seconds   Active Hamstring Stretch Limitations supine with rope   Quad Stretch Left;3 reps;30 seconds   Quad Stretch Limitations prone  Knee: Self-Stretch to increase Flexion Left;10 seconds   Knee: Self-Stretch Limitations knee drive on 29JJ step 88C 10" holds   Gastroc Stretch Both;3 reps;30 seconds   Gastroc Stretch Limitations slant board     Knee/Hip Exercises: Standing   Terminal Knee Extension Limitations 10x5" with BTB     Knee/Hip Exercises: Supine   Quad Sets Left;1 set;15 reps   [employer] Limitations 3" holds   Short Arc [employer] 15 reps   Short Arc Quad Sets Limitations 3" holds   Heel Slides 10 reps   Heel Slides Limitations 3 second holds    Knee Extension Limitations 2 degrees    Knee Flexion Limitations 118 degrees     Knee/Hip Exercises: Prone   Hamstring Curl 10 reps     Manual Therapy   Manual Therapy Edema management;Joint mobilization;Soft tissue mobilization   Manual therapy comments performed  separatley from all other skilled services    Edema Management retrograde massage LE elevated    Joint Mobilization patella mobility all directions   Soft tissue mobilization scar massage all directions                   PT Short Term Goals - 05/22/17 1215      PT SHORT TERM GOAL #1   Title Pt will be independent with HEP and perform consistently to maximzie return to PLOF.   Time 3   Period Weeks   Status New   Target Date 06/12/17     PT SHORT TERM GOAL #2   Title Pt will have decreased L knee edema by 1" or > to maximize ROM and decrease pain.   Time 3   Period Weeks   Status New     PT SHORT TERM GOAL #3   Title Pt will have improved AROM to at least 0-115 to maximize gait and stair ambulation.   Time 3   Period Weeks   Status New     PT SHORT TERM GOAL #4   Title Pt will be able to perform SLS for 10 sec or > on BLE in order to maximize gait on uneven ground.   Time 3   Period Weeks   Status New           PT Long Term Goals - 05/22/17 1217      PT LONG TERM GOAL #1   Title Pt will have improved MMT of all muscle groups tested to 5/5 in order to maximize gait and balance.    Time 6   Period Weeks   Status New   Target Date 07/03/17     PT LONG TERM GOAL #2   Title Pt will have improved 3MWT by 128ft or greater with min to no gait deviations in order to maximize community access.    Time 6   Period Weeks   Status New     PT LONG TERM GOAL #3   Title Pt will be able to ascend/descend a full flight of stairs reciprocally with 1 to no handrail and demonstrate proper mechanics with good eccentric control in order to show improved functional strength and ROM of the L knee so pt can access her upstairs with greater ease.   Time 6   Period Weeks   Status New     PT LONG TERM GOAL #4   Title Pt will report being able to sleep through the night without awakening due to pain in order to maximize overall recovery.  Time 6   Period Weeks   Status New                Plan - 05/31/17 1519    Clinical Impression Statement Pt progressing well with POC.  Session focus on knee mobility and ROM.  Began session with manual retro massage to address edema and soft tissue restrictions on quad.  Therex focus on stretches and quad/hamstring activation for strengthening.  Pt progressing well with improved AROM 2-118 degrees at EOS.  No reports of increased pain through session.     Rehab Potential Good   PT Frequency 3x / week   PT Duration 6 weeks   PT Treatment/Interventions Cryotherapy;Electrical Stimulation;Moist Heat;DME Instruction;Gait training;Stair training;Functional mobility training;Therapeutic activities;Therapeutic exercise;Balance training;Neuromuscular re-education;Patient/family education;Manual techniques;Scar mobilization;Passive range of motion;Dry needling;Energy conservation;Taping   PT Next Visit Plan ROM progressing well, continue knee mobility exercises.  Continue manual PRN for edema/soft tissue restrictions.  Progress functional strengthening with sit to stand and begin heel/toe raises and SLS for gait training and balance.     PT Home Exercise Plan eval: quad sets, heel slides, knee drives on step, standing calf stretch on step      Patient will benefit from skilled therapeutic intervention in order to improve the following deficits and impairments:  Abnormal gait, Decreased activity tolerance, Decreased balance, Decreased endurance, Decreased mobility, Decreased range of motion, Decreased strength, Difficulty walking, Hypomobility, Increased edema, Increased fascial restricitons, Increased muscle spasms, Impaired flexibility, Improper body mechanics, Pain  Visit Diagnosis: Acute pain of left knee  Stiffness of left knee, not elsewhere classified  Localized edema  Muscle weakness (generalized)     Problem List Patient Active Problem List   Diagnosis Date Noted  . Unilateral primary osteoarthritis, left knee  05/02/2017  . S/P total knee replacement using cement, left 05/02/2017  . Primary osteoarthritis of left knee 12/21/2016  . Chondromalacia patellae, left knee 05/26/2016  . Breast cancer of upper-inner quadrant of right female breast ([place]) 11/04/2015  . GERD (gastroesophageal reflux disease) 09/02/2013  . Obesity 09/02/2013  . Ventral hernia, recurrent 09/02/2013  . Unspecified asthma(493.90) 12/12/2012  . Cough 03/27/2012  . Right middle lobe syndrome 03/27/2012   Ihor Austin, LPTA; [place]  Aldona Lento 05/31/2017, 3:32 PM  [facility] [address] [place], [place], [address] Phone: [phone]   Fax:  [phone]  Name: LACHELE LIEVANOS MRN: [id number] Date of Birth: [birth year]

## 2017-06-02 ENCOUNTER — Encounter (HOSPITAL_COMMUNITY): Payer: Self-pay

## 2017-06-02 ENCOUNTER — Ambulatory Visit (HOSPITAL_COMMUNITY): Payer: Medicare Other | Attending: Orthopaedic Surgery

## 2017-06-02 DIAGNOSIS — M25662 Stiffness of left knee, not elsewhere classified: Secondary | ICD-10-CM | POA: Insufficient documentation

## 2017-06-02 DIAGNOSIS — M6281 Muscle weakness (generalized): Secondary | ICD-10-CM | POA: Diagnosis not present

## 2017-06-02 DIAGNOSIS — I890 Lymphedema, not elsewhere classified: Secondary | ICD-10-CM | POA: Insufficient documentation

## 2017-06-02 DIAGNOSIS — R600 Localized edema: Secondary | ICD-10-CM | POA: Insufficient documentation

## 2017-06-02 DIAGNOSIS — M25562 Pain in left knee: Secondary | ICD-10-CM | POA: Insufficient documentation

## 2017-06-02 NOTE — Therapy (Signed)
[facility] [place], [place], [address] Phone: [phone]   Fax:  [phone]  Physical Therapy Treatment  Patient Details  Name: Caitlin [name] MRN: [id number] Date of Birth: 07/04/1946 Referring Provider: Joni Fears, MD  Encounter Date: 06/02/2017      PT End of Session - 06/02/17 1309    Visit Number 4   Number of Visits 18   Date for PT Re-Evaluation 06/12/17   Authorization Type Medicare Part A and B   Authorization Time Period 05/22/17 to 07/03/17   Authorization - Visit Number 4   Authorization - Number of Visits 10   PT Start Time 2458   PT Stop Time 1345  3' on bike at beginning of session, no charge   PT Time Calculation (min) 42 min   Activity Tolerance Patient tolerated treatment well;No increased pain   Behavior During Therapy WFL for tasks assessed/performed      Past Medical History:  Diagnosis Date  . Arthritis   . Asthma   . Breast cancer ([place])   . Breast disorder    cancer  . Breast mass    benign  . GERD (gastroesophageal reflux disease)   . Heart murmur    AS CHILD  OUTGREW IT  . History of hiatal hernia   . Hyperlipidemia   . Hypertension   . Impaired fasting glucose   . Rectocele   . Varicose vein of leg     Past Surgical History:  Procedure Laterality Date  . BREAST LUMPECTOMY WITH RADIOACTIVE SEED AND SENTINEL LYMPH NODE BIOPSY Right 11/11/2015   Procedure: RIGHT BREAST RADIOACTIVE SEED GUIDED  LUMPECTOMY AND RADIOACTIVE SEED TARGETED SENTINEL LYMPH NODE BIOPSY;  Surgeon: Stark Klein, MD;  Location: [facility];  Service: General;  Laterality: Right;  . BREAST SURGERY Left    breast biopies x4  . CHOLECYSTECTOMY  09/08/2010  . COLONOSCOPY    . COLONOSCOPY N/A 09/25/2013   Procedure: COLONOSCOPY;  Surgeon: Rogene Houston, MD;  [facility];  Service: Endoscopy;  Laterality: N/A;  100-moved to 1200 Ann to notify pt  . HERNIA REPAIR  12/24/2010, 12-2014    TOTAL 4  . LAPAROSCOPIC GASTRIC SLEEVE RESECTION  11-2013   at [facility]  2007/2008  . TONSILLECTOMY  1968  . TOTAL KNEE ARTHROPLASTY Left 05/02/2017  . TOTAL KNEE ARTHROPLASTY Left 05/02/2017   Procedure: LEFT TOTAL KNEE ARTHROPLASTY;  Surgeon: [name], MD;  Location: [facility];  Service: Orthopedics;  Laterality: Left;  . UPPER GASTROINTESTINAL ENDOSCOPY    . VAGINAL HYSTERECTOMY  1980s    There were no vitals filed for this visit.      Subjective Assessment - 06/02/17 1306    Subjective Pt stated she is feeling a lot better today, has 3 days left with medication for UTI.  No reports of knee pain today.   Patient Stated Goals full motion of the knee   Currently in Pain? No/denies            [facility] PT Assessment - 06/02/17 0001      Assessment   Medical Diagnosis L TKA   Referring Provider Joni Fears, MD   Onset Date/Surgical Date 05/02/17   Next MD Visit 11/28   Prior Therapy d/c from [facility]; no prior PT for the knee     Precautions   Precautions None     Observation/Other Assessments   Observations Measurements taken  for possible compression hose.  Shoe size 8 1/2.  Ankle 10.75", calf 14.75", mid thigh 22"     Observation/Other Assessments-Edema    Edema Circumferential     Circumferential Edema   Circumferential - Right 16.5"   Circumferential - Left  18.25" joint line                     [facility] Adult PT Treatment/Exercise - 06/02/17 0001      Knee/Hip Exercises: Stretches   Active Hamstring Stretch Left;3 reps;30 seconds   Active Hamstring Stretch Limitations supine with rope   Quad Stretch Left;3 reps;30 seconds   Quad Stretch Limitations prone    Knee: Self-Stretch to increase Flexion Left;10 seconds   Knee: Self-Stretch Limitations knee drive on 01SW step 10X 10" holds     Knee/Hip Exercises: Standing   Heel Raises 10 reps   Heel Raises Limitations toe raises   Terminal Knee Extension  Limitations 10x5" with BTB     Knee/Hip Exercises: Supine   Quad Sets Left;1 set;15 reps   Short Arc Quad Sets 15 reps   Heel Slides 10 reps   Knee Extension Limitations 2 degrees    Knee Flexion Limitations 118 degrees   Other Supine Knee/Hip Exercises Cueing to reduce ER with therex     Knee/Hip Exercises: Prone   Hamstring Curl 10 reps     Manual Therapy   Manual Therapy Edema management;Joint mobilization;Soft tissue mobilization   Manual therapy comments performed separatley from all other skilled services    Edema Management retrograde massage LE elevated    Joint Mobilization patella mobility all directions   Soft tissue mobilization scar massage all directions                 PT Education - 06/02/17 1321    Education provided Yes   Education Details Discussed/educated benefits with compression hose for edema control.  Pt reports she believes she already owns a pair, encouraged to try out over weekend and let us know if need new.  Measurements taken and given [place] paperwork   Person(s) Educated Patient   Methods Explanation;Handout   Comprehension Verbalized understanding;Returned demonstration          PT Short Term Goals - 05/22/17 1215      PT SHORT TERM GOAL #1   Title Pt will be independent with HEP and perform consistently to maximzie return to PLOF.   Time 3   Period Weeks   Status New   Target Date 06/12/17     PT SHORT TERM GOAL #2   Title Pt will have decreased L knee edema by 1" or > to maximize ROM and decrease pain.   Time 3   Period Weeks   Status New     PT SHORT TERM GOAL #3   Title Pt will have improved AROM to at least 0-115 to maximize gait and stair ambulation.   Time 3   Period Weeks   Status New     PT SHORT TERM GOAL #4   Title Pt will be able to perform SLS for 10 sec or > on BLE in order to maximize gait on uneven ground.   Time 3   Period Weeks   Status New           PT Long Term Goals - 05/22/17 1217       PT LONG TERM GOAL #1   Title Pt will have improved MMT of all muscle groups tested  to 5/5 in order to maximize gait and balance.    Time 6   Period Weeks   Status New   Target Date 07/03/17     PT LONG TERM GOAL #2   Title Pt will have improved 3MWT by 164ft or greater with min to no gait deviations in order to maximize community access.    Time 6   Period Weeks   Status New     PT LONG TERM GOAL #3   Title Pt will be able to ascend/descend a full flight of stairs reciprocally with 1 to no handrail and demonstrate proper mechanics with good eccentric control in order to show improved functional strength and ROM of the L knee so pt can access her upstairs with greater ease.   Time 6   Period Weeks   Status New     PT LONG TERM GOAL #4   Title Pt will report being able to sleep through the night without awakening due to pain in order to maximize overall recovery.   Time 6   Period Weeks   Status New               Plan - 06/02/17 1343    Clinical Impression Statement Pt continues to have edema present proximal knee.  Began session with manual to assist with edema control.  Educated benefits of compression hose, paperwork given and measurements taken.  Pt stated she continues to be sensitive with anything touching incision, wear loose pants to improve tolerance.  Pt AROM 2-118 following manual.  Added CKC heel/toe raises for strengthening to improve gait mechanics.  No reports of increased pain through session.   Rehab Potential Good   PT Frequency 3x / week   PT Duration 6 weeks   PT Treatment/Interventions Cryotherapy;Electrical Stimulation;Moist Heat;DME Instruction;Gait training;Stair training;Functional mobility training;Therapeutic activities;Therapeutic exercise;Balance training;Neuromuscular re-education;Patient/family education;Manual techniques;Scar mobilization;Passive range of motion;Dry needling;Energy conservation;Taping   PT Next Visit Plan F/U with compression  hose.  ROM progressing well, continue knee mobility exercises.  Continue manual PRN for edema/soft tissue restrictions.  Progress functional strengthening with sit to stand (limited by time this session), rockerboard, and SLS for gait training and balance.     PT Home Exercise Plan eval: quad sets, heel slides, knee drives on step, standing calf stretch on step   Recommended Other Services Compression hose for edema control?      Patient will benefit from skilled therapeutic intervention in order to improve the following deficits and impairments:  Abnormal gait, Decreased activity tolerance, Decreased balance, Decreased endurance, Decreased mobility, Decreased range of motion, Decreased strength, Difficulty walking, Hypomobility, Increased edema, Increased fascial restricitons, Increased muscle spasms, Impaired flexibility, Improper body mechanics, Pain  Visit Diagnosis: Acute pain of left knee  Stiffness of left knee, not elsewhere classified  Localized edema  Muscle weakness (generalized)     Problem List Patient Active Problem List   Diagnosis Date Noted  . Unilateral primary osteoarthritis, left knee 05/02/2017  . S/P total knee replacement using cement, left 05/02/2017  . Primary osteoarthritis of left knee 12/21/2016  . Chondromalacia patellae, left knee 05/26/2016  . Breast cancer of upper-inner quadrant of right female breast ([place]) 11/04/2015  . GERD (gastroesophageal reflux disease) 09/02/2013  . Obesity 09/02/2013  . Ventral hernia, recurrent 09/02/2013  . Unspecified asthma(493.90) 12/12/2012  . Cough 03/27/2012  . Right middle lobe syndrome 03/27/2012   Ihor Austin, LPTA; [facility]  Aldona Lento 06/02/2017, 2:18 PM  [facility] [place], [place], [address] Phone: [phone]   Fax:  [phone]  Name: Caitlin [name] MRN: [id number] Date of Birth: [birth year]

## 2017-06-06 ENCOUNTER — Encounter (HOSPITAL_COMMUNITY): Payer: Self-pay | Admitting: Physical Therapy

## 2017-06-06 ENCOUNTER — Ambulatory Visit (HOSPITAL_COMMUNITY): Payer: Medicare Other | Admitting: Physical Therapy

## 2017-06-06 DIAGNOSIS — M25662 Stiffness of left knee, not elsewhere classified: Secondary | ICD-10-CM

## 2017-06-06 DIAGNOSIS — R600 Localized edema: Secondary | ICD-10-CM | POA: Diagnosis not present

## 2017-06-06 DIAGNOSIS — M25562 Pain in left knee: Secondary | ICD-10-CM

## 2017-06-06 DIAGNOSIS — M6281 Muscle weakness (generalized): Secondary | ICD-10-CM

## 2017-06-06 DIAGNOSIS — I890 Lymphedema, not elsewhere classified: Secondary | ICD-10-CM | POA: Diagnosis not present

## 2017-06-06 NOTE — Therapy (Signed)
[facility] [place], [place], [address] Phone: [phone]   Fax:  [phone]  Physical Therapy Treatment  Patient Details  Name: Caitlin [name] MRN: [id number] Date of Birth: Nov 23, 1945 Referring Provider: Joni Fears, MD   Encounter Date: 06/06/2017  PT End of Session - 06/06/17 1202    Visit Number  5    Number of Visits  18    Date for PT Re-Evaluation  06/12/17    Authorization Type  Medicare Part A and B    Authorization Time Period  05/22/17 to 07/03/17    Authorization - Visit Number  5    Authorization - Number of Visits  10    PT Start Time  6503    PT Stop Time  1200    PT Time Calculation (min)  42 min    Activity Tolerance  Patient tolerated treatment well;No increased pain    Behavior During Therapy  WFL for tasks assessed/performed       Past Medical History:  Diagnosis Date  . Arthritis   . Asthma   . Breast cancer ([place])   . Breast disorder    cancer  . Breast mass    benign  . GERD (gastroesophageal reflux disease)   . Heart murmur    AS CHILD  OUTGREW IT  . History of hiatal hernia   . Hyperlipidemia   . Hypertension   . Impaired fasting glucose   . Rectocele   . Varicose vein of leg     Past Surgical History:  Procedure Laterality Date  . BREAST SURGERY Left    breast biopies x4  . CHOLECYSTECTOMY  09/08/2010  . COLONOSCOPY    . HERNIA REPAIR  12/24/2010, 12-2014   TOTAL 4  . LAPAROSCOPIC GASTRIC SLEEVE RESECTION  11-2013   at [facility]  2007/2008  . TONSILLECTOMY  1968  . TOTAL KNEE ARTHROPLASTY Left 05/02/2017  . UPPER GASTROINTESTINAL ENDOSCOPY    . VAGINAL HYSTERECTOMY  1980s    There were no vitals filed for this visit.  Subjective Assessment - 06/06/17 1120    Subjective  Patietn does her exercise at least 2x/day, doing her full HEP. Reports putting vitamin E and mederma on her scar, performing ehr scar mobilization. Still icing  2x/day. Stil can't sleep on her left side because pain, it will wake ehrup at night.     Currently in Pain?  Yes    Pain Score  1     Pain Location  Knee feels sensitive   feels sensitive   Pain Orientation  Left    Pain Descriptors / Indicators  Aching    Pain Type  Surgical pain    Pain Onset  More than a month ago    Pain Frequency  Intermittent    Aggravating Factors   knee flexion    Pain Relieving Factors  icing, sometimes Advil    Effect of Pain on Daily Activities  minimal                      [facility] Adult PT Treatment/Exercise - 06/06/17 0001      Knee/Hip Exercises: Stretches   Active Hamstring Stretch  Left;2 reps;30 seconds    Active Hamstring Stretch Limitations  supine with rope    Quad Stretch  Left;2 reps;30 seconds    Quad Stretch Limitations  prone     Knee: Self-Stretch to increase Flexion  Left;30 seconds;5 reps    Knee: Self-Stretch Limitations  knee drive on 18EX step    Gastroc Stretch  2 reps;30 seconds;Both    Gastroc Stretch Limitations  slant board      Knee/Hip Exercises: Standing   Terminal Knee Extension  AROM;Strengthening;10 reps;Theraband    Theraband Level (Terminal Knee Extension)  Level 3 (Green)    Functional Squat  1 set;15 reps wall squat, cues for equal weight shift   wall squat, cues for equal weight shift         Balance Exercises - 06/06/17 1159      Balance Exercises: Standing   Tandem Stance  Eyes open;Foam/compliant surface;2 reps;25 secs        PT Education - 06/06/17 1201    Education provided  Yes    Education Details  discussed scar management and cross friction massage to break up adhesions, discussed lotion for skin intergrity    Person(s) Educated  Patient    Methods  Explanation    Comprehension  Verbalized understanding;Returned demonstration       PT Short Term Goals - 05/22/17 1215      PT SHORT TERM GOAL #1   Title  Pt will be independent with HEP and perform consistently to maximzie  return to PLOF.    Time  3    Period  Weeks    Status  New    Target Date  06/12/17      PT SHORT TERM GOAL #2   Title  Pt will have decreased L knee edema by 1" or > to maximize ROM and decrease pain.    Time  3    Period  Weeks    Status  New      PT SHORT TERM GOAL #3   Title  Pt will have improved AROM to at least 0-115 to maximize gait and stair ambulation.    Time  3    Period  Weeks    Status  New      PT SHORT TERM GOAL #4   Title  Pt will be able to perform SLS for 10 sec or > on BLE in order to maximize gait on uneven ground.    Time  3    Period  Weeks    Status  New        PT Long Term Goals - 05/22/17 1217      PT LONG TERM GOAL #1   Title  Pt will have improved MMT of all muscle groups tested to 5/5 in order to maximize gait and balance.     Time  6    Period  Weeks    Status  New    Target Date  07/03/17      PT LONG TERM GOAL #2   Title  Pt will have improved 3MWT by 110ft or greater with min to no gait deviations in order to maximize community access.     Time  6    Period  Weeks    Status  New      PT LONG TERM GOAL #3   Title  Pt will be able to ascend/descend a full flight of stairs reciprocally with 1 to no handrail and demonstrate proper mechanics with good eccentric control in order to show improved functional strength and ROM of the L knee so pt can access her upstairs with greater ease.    Time  6    Period  Weeks    Status  New      PT LONG TERM GOAL #4   Title  Pt will report being able to sleep through the night without awakening due to pain in order to maximize overall recovery.    Time  6    Period  Weeks    Status  New            Plan - 06/06/17 1206    Clinical Impression Statement  Patient is progressing well in therapy. She continues to participate well and remains compliant with HEP. She will continue to benefit from skilled PT to address ROM deficits to improve functional mobility. Will advance functional strengthening  and balance training. Patient may benefit from compression garment for edema management and has been educated on ordering garment.    Rehab Potential  Good    PT Frequency  3x / week    PT Duration  6 weeks    PT Treatment/Interventions  Cryotherapy;Electrical Stimulation;Moist Heat;DME Instruction;Gait training;Stair training;Functional mobility training;Therapeutic activities;Therapeutic exercise;Balance training;Neuromuscular re-education;Patient/family education;Manual techniques;Scar mobilization;Passive range of motion;Dry needling;Energy conservation;Taping    PT Next Visit Plan  F/U with compression hose.  ROM progressing well, continue knee mobility exercises.  Continue manual PRN for edema/soft tissue restrictions.  Progress functional strengthening with sit to stand (limited by time this session), squat, step ups, rockerboard, and SLS for gait training and balance.      PT Home Exercise Plan  eval: quad sets, heel slides, knee drives on step, standing calf stretch on step; add theraband (green) exercises`    Consulted and Agree with Plan of Care  Patient       Patient will benefit from skilled therapeutic intervention in order to improve the following deficits and impairments:  Abnormal gait, Decreased activity tolerance, Decreased balance, Decreased endurance, Decreased mobility, Decreased range of motion, Decreased strength, Difficulty walking, Hypomobility, Increased edema, Increased fascial restricitons, Increased muscle spasms, Impaired flexibility, Improper body mechanics, Pain  Visit Diagnosis: Acute pain of left knee  Stiffness of left knee, not elsewhere classified  Localized edema  Muscle weakness (generalized)     Problem List Patient Active Problem List   Diagnosis Date Noted  . Unilateral primary osteoarthritis, left knee 05/02/2017  . S/P total knee replacement using cement, left 05/02/2017  . Primary osteoarthritis of left knee 12/21/2016  . Chondromalacia  patellae, left knee 05/26/2016  . Breast cancer of upper-inner quadrant of right female breast ([facility]) 11/04/2015  . GERD (gastroesophageal reflux disease) 09/02/2013  . Obesity 09/02/2013  . Ventral hernia, recurrent 09/02/2013  . Unspecified asthma(493.90) 12/12/2012  . Cough 03/27/2012  . Right middle lobe syndrome 03/27/2012    Deniece Ree PT, DPT [facility] [address] [place], [place], [address] Phone: [phone]   Fax:  [phone]  Name: Caitlin [name] MRN: [id number] Date of Birth: Sep 16, 1945

## 2017-06-07 ENCOUNTER — Inpatient Hospital Stay (INDEPENDENT_AMBULATORY_CARE_PROVIDER_SITE_OTHER): Payer: Medicare Other | Admitting: Orthopaedic Surgery

## 2017-06-08 ENCOUNTER — Ambulatory Visit (HOSPITAL_COMMUNITY): Payer: Medicare Other

## 2017-06-08 DIAGNOSIS — I890 Lymphedema, not elsewhere classified: Secondary | ICD-10-CM | POA: Diagnosis not present

## 2017-06-08 DIAGNOSIS — R600 Localized edema: Secondary | ICD-10-CM

## 2017-06-08 DIAGNOSIS — M25562 Pain in left knee: Secondary | ICD-10-CM | POA: Diagnosis not present

## 2017-06-08 DIAGNOSIS — M25662 Stiffness of left knee, not elsewhere classified: Secondary | ICD-10-CM

## 2017-06-08 DIAGNOSIS — M6281 Muscle weakness (generalized): Secondary | ICD-10-CM

## 2017-06-08 NOTE — Therapy (Signed)
[facility] [place], [place], [address] Phone: [phone]   Fax:  [phone]  Physical Therapy Treatment  Patient Details  Name: Caitlin [name] MRN: [id number] Date of Birth: 1945-08-09 Referring Provider: Joni Fears, MD   Encounter Date: 06/08/2017  PT End of Session - 06/08/17 1527    Visit Number  6    Number of Visits  18    Date for PT Re-Evaluation  06/12/17    Authorization Type  Medicare Part A and B    Authorization Time Period  05/22/17 to 07/03/17    Authorization - Visit Number  6    Authorization - Number of Visits  10    PT Start Time  6834    PT Stop Time  1600    PT Time Calculation (min)  39 min    Activity Tolerance  Patient tolerated treatment well;No increased pain    Behavior During Therapy  WFL for tasks assessed/performed       Past Medical History:  Diagnosis Date  . Arthritis   . Asthma   . Breast cancer ([facility])   . Breast disorder    cancer  . Breast mass    benign  . GERD (gastroesophageal reflux disease)   . Heart murmur    AS CHILD  OUTGREW IT  . History of hiatal hernia   . Hyperlipidemia   . Hypertension   . Impaired fasting glucose   . Rectocele   . Varicose vein of leg     Past Surgical History:  Procedure Laterality Date  . BREAST SURGERY Left    breast biopies x4  . CHOLECYSTECTOMY  09/08/2010  . COLONOSCOPY    . HERNIA REPAIR  12/24/2010, 12-2014   TOTAL 4  . LAPAROSCOPIC GASTRIC SLEEVE RESECTION  11-2013   at [facility]  2007/2008  . TONSILLECTOMY  1968  . TOTAL KNEE ARTHROPLASTY Left 05/02/2017  . UPPER GASTROINTESTINAL ENDOSCOPY    . VAGINAL HYSTERECTOMY  1980s    There were no vitals filed for this visit.  Subjective Assessment - 06/08/17 1524    Subjective  Patient reports she is doing well thus far today. No significant pain, but still tight at night, waking her periodically. HEP is going well. Scar massage also going  well.     Currently in Pain?  No/denies    Pain Score  -- just stiffness and soreness; left knee    Pain Location  Knee         [facility] PT Assessment - 06/08/17 0001      Observation/Other Assessments-Edema    Edema  Circumferential      Circumferential Edema   Circumferential - Right  17.17 inches    Circumferential - Left   19.09-inches      AROM   AROM Assessment Site  Knee    Right/Left Knee  Left    Left Knee Extension  11    Left Knee Flexion  103                  [facility] Adult PT Treatment/Exercise - 06/08/17 0001      Knee/Hip Exercises: Stretches   Active Hamstring Stretch  Left;2 reps;30 seconds    Gastroc Stretch  Left;2 reps;30 seconds      Knee/Hip Exercises: Standing   Heel Raises  2 sets;20 reps;Both    Knee Flexion  Left;2 sets;10 reps combo knee/hip flexion "heel to butt"  3lb weight      Knee/Hip Exercises: Seated   Long Arc Quad  Left;2 sets;15 reps 3sec hold; excellent activation and ROM    Heel Slides  AROM;1 set;Left Heel slides x 5 minutes; 3" flexion stretch:3" quadset    Sit to Sand  2 sets;10 reps;without UE support VC for form, 22" high surface, shoulders in 90* flexion               PT Short Term Goals - 05/22/17 1215      PT SHORT TERM GOAL #1   Title  Pt will be independent with HEP and perform consistently to maximzie return to PLOF.    Time  3    Period  Weeks    Status  New    Target Date  06/12/17      PT SHORT TERM GOAL #2   Title  Pt will have decreased L knee edema by 1" or > to maximize ROM and decrease pain.    Time  3    Period  Weeks    Status  New      PT SHORT TERM GOAL #3   Title  Pt will have improved AROM to at least 0-115 to maximize gait and stair ambulation.    Time  3    Period  Weeks    Status  New      PT SHORT TERM GOAL #4   Title  Pt will be able to perform SLS for 10 sec or > on BLE in order to maximize gait on uneven ground.    Time  3    Period  Weeks    Status  New        PT  Long Term Goals - 05/22/17 1217      PT LONG TERM GOAL #1   Title  Pt will have improved MMT of all muscle groups tested to 5/5 in order to maximize gait and balance.     Time  6    Period  Weeks    Status  New    Target Date  07/03/17      PT LONG TERM GOAL #2   Title  Pt will have improved 3MWT by 141ft or greater with min to no gait deviations in order to maximize community access.     Time  6    Period  Weeks    Status  New      PT LONG TERM GOAL #3   Title  Pt will be able to ascend/descend a full flight of stairs reciprocally with 1 to no handrail and demonstrate proper mechanics with good eccentric control in order to show improved functional strength and ROM of the L knee so pt can access her upstairs with greater ease.    Time  6    Period  Weeks    Status  New      PT LONG TERM GOAL #4   Title  Pt will report being able to sleep through the night without awakening due to pain in order to maximize overall recovery.    Time  6    Period  Weeks    Status  New            Plan - 06/08/17 1528    Clinical Impression Statement  Patient continues to make excellent progress toward goals. Independence in HEP is going well, joint effusion remains significant and limiting to mobility. Session continued to focus on easy AROM and  muscle activation. Pt has not had a chance to try donning compression garmets yet..    Rehab Potential  Good    PT Frequency  3x / week    PT Duration  6 weeks    PT Treatment/Interventions  Cryotherapy;Electrical Stimulation;Moist Heat;DME Instruction;Gait training;Stair training;Functional mobility training;Therapeutic activities;Therapeutic exercise;Balance training;Neuromuscular re-education;Patient/family education;Manual techniques;Scar mobilization;Passive range of motion;Dry needling;Energy conservation;Taping    PT Next Visit Plan  F/U with compression hose AGAIN.  ROM progressing well, continue knee mobility exercises.  Continue manual PRN for  edema/soft tissue restrictions.  Progress functional strengthening with sit to stand (limited by time this session), squat, step ups, rockerboard, and SLS for gait training and balance.      PT Home Exercise Plan  eval: quad sets, heel slides, knee drives on step, standing calf stretch on step; add theraband (green) exercises`    Consulted and Agree with Plan of Care  Patient       Patient will benefit from skilled therapeutic intervention in order to improve the following deficits and impairments:  Abnormal gait, Decreased activity tolerance, Decreased balance, Decreased endurance, Decreased mobility, Decreased range of motion, Decreased strength, Difficulty walking, Hypomobility, Increased edema, Increased fascial restricitons, Increased muscle spasms, Impaired flexibility, Improper body mechanics, Pain  Visit Diagnosis: Acute pain of left knee  Stiffness of left knee, not elsewhere classified  Localized edema  Muscle weakness (generalized)  Lymphedema, not elsewhere classified     Problem List Patient Active Problem List   Diagnosis Date Noted  . Unilateral primary osteoarthritis, left knee 05/02/2017  . S/P total knee replacement using cement, left 05/02/2017  . Primary osteoarthritis of left knee 12/21/2016  . Chondromalacia patellae, left knee 05/26/2016  . Breast cancer of upper-inner quadrant of right female breast ([facility]) 11/04/2015  . GERD (gastroesophageal reflux disease) 09/02/2013  . Obesity 09/02/2013  . Ventral hernia, recurrent 09/02/2013  . Unspecified asthma(493.90) 12/12/2012  . Cough 03/27/2012  . Right middle lobe syndrome 03/27/2012    3:58 PM, 06/08/17 Etta Grandchild, PT, DPT Physical Therapist at [facility] [phone] (office)     Etta Grandchild 06/08/2017, 3:58 PM  [facility] [address] [place], [place], [address] Phone: [phone]   Fax:  [phone]  Name:  LASHAYA KIENITZ MRN: [id number] Date of Birth: 1946-07-04

## 2017-06-09 ENCOUNTER — Encounter (HOSPITAL_COMMUNITY): Payer: Self-pay

## 2017-06-09 ENCOUNTER — Ambulatory Visit (HOSPITAL_COMMUNITY): Payer: Medicare Other

## 2017-06-09 DIAGNOSIS — I890 Lymphedema, not elsewhere classified: Secondary | ICD-10-CM | POA: Diagnosis not present

## 2017-06-09 DIAGNOSIS — M6281 Muscle weakness (generalized): Secondary | ICD-10-CM

## 2017-06-09 DIAGNOSIS — M25562 Pain in left knee: Secondary | ICD-10-CM | POA: Diagnosis not present

## 2017-06-09 DIAGNOSIS — M25662 Stiffness of left knee, not elsewhere classified: Secondary | ICD-10-CM

## 2017-06-09 DIAGNOSIS — R600 Localized edema: Secondary | ICD-10-CM

## 2017-06-09 NOTE — Therapy (Signed)
[facility] [place], [place], [address] Phone: [phone]   Fax:  [phone]  Physical Therapy Treatment  Patient Details  Name: Caitlin [name] MRN: [id number] Date of Birth: August 17, 1945 Referring Provider: Joni Fears, MD   Encounter Date: 06/09/2017  PT End of Session - 06/09/17 1043    Visit Number  7    Number of Visits  18    Date for PT Re-Evaluation  06/12/17    Authorization Type  Medicare Part A and B    Authorization Time Period  05/22/17 to 07/03/17    Authorization - Visit Number  7    Authorization - Number of Visits  10    PT Start Time  3419    PT Stop Time  1115    PT Time Calculation (min)  43 min    Activity Tolerance  Patient tolerated treatment well;No increased pain    Behavior During Therapy  WFL for tasks assessed/performed       Past Medical History:  Diagnosis Date  . Arthritis   . Asthma   . Breast cancer ([facility])   . Breast disorder    cancer  . Breast mass    benign  . GERD (gastroesophageal reflux disease)   . Heart murmur    AS CHILD  OUTGREW IT  . History of hiatal hernia   . Hyperlipidemia   . Hypertension   . Impaired fasting glucose   . Rectocele   . Varicose vein of leg     Past Surgical History:  Procedure Laterality Date  . BREAST SURGERY Left    breast biopies x4  . CHOLECYSTECTOMY  09/08/2010  . COLONOSCOPY    . HERNIA REPAIR  12/24/2010, 12-2014   TOTAL 4  . LAPAROSCOPIC GASTRIC SLEEVE RESECTION  11-2013   at [facility]  2007/2008  . TONSILLECTOMY  1968  . TOTAL KNEE ARTHROPLASTY Left 05/02/2017  . UPPER GASTROINTESTINAL ENDOSCOPY    . VAGINAL HYSTERECTOMY  1980s    There were no vitals filed for this visit.  Subjective Assessment - 06/09/17 1036    Subjective  Pt stated some soreness following the addition of weight in therapy.  Minimal pain today, maybe 1/10.  Plans to order compression hose today, brought in a pair of  TED hose and old compression hose    Patient Stated Goals  full motion of the knee    Currently in Pain?  Yes    Pain Score  1     Pain Location  Knee    Pain Orientation  Left    Pain Descriptors / Indicators  Aching;Sore    Pain Type  Surgical pain    Pain Onset  More than a month ago    Pain Frequency  Intermittent    Aggravating Factors   knee flexion    Pain Relieving Factors  icing, sometimes Advil    Effect of Pain on Daily Activities  minimal                      [facility] Adult PT Treatment/Exercise - 06/09/17 0001      Knee/Hip Exercises: Stretches   Active Hamstring Stretch  Left;3 reps;30 seconds    Quad Stretch  Left;2 reps;30 seconds    Quad Stretch Limitations  prone     Knee: Self-Stretch to increase Flexion  Left;10 seconds    Knee: Self-Stretch Limitations  knee drive on  12in step 10x 10"      Knee/Hip Exercises: Standing   Wall Squat  15 reps    Rocker Board  2 minutes lateral      Knee/Hip Exercises: Supine   Short Arc Quad Sets  15 reps    Knee Extension Limitations  5 degress    Knee Flexion Limitations  116 degrees      Knee/Hip Exercises: Prone   Prone Knee Hang  3 minutes with manual to hamstrings      Manual Therapy   Manual Therapy  Edema management;Joint mobilization;Soft tissue mobilization    Manual therapy comments  performed separatley from all other skilled services     Edema Management  retrograde massage LE elevated     Joint Mobilization  patella mobility all directions    Soft tissue mobilization  soft tissue mobilization to rectus femoris               PT Short Term Goals - 05/22/17 1215      PT SHORT TERM GOAL #1   Title  Pt will be independent with HEP and perform consistently to maximzie return to PLOF.    Time  3    Period  Weeks    Status  New    Target Date  06/12/17      PT SHORT TERM GOAL #2   Title  Pt will have decreased L knee edema by 1" or > to maximize ROM and decrease pain.    Time  3     Period  Weeks    Status  New      PT SHORT TERM GOAL #3   Title  Pt will have improved AROM to at least 0-115 to maximize gait and stair ambulation.    Time  3    Period  Weeks    Status  New      PT SHORT TERM GOAL #4   Title  Pt will be able to perform SLS for 10 sec or > on BLE in order to maximize gait on uneven ground.    Time  3    Period  Weeks    Status  New        PT Long Term Goals - 05/22/17 1217      PT LONG TERM GOAL #1   Title  Pt will have improved MMT of all muscle groups tested to 5/5 in order to maximize gait and balance.     Time  6    Period  Weeks    Status  New    Target Date  07/03/17      PT LONG TERM GOAL #2   Title  Pt will have improved 3MWT by 176ft or greater with min to no gait deviations in order to maximize community access.     Time  6    Period  Weeks    Status  New      PT LONG TERM GOAL #3   Title  Pt will be able to ascend/descend a full flight of stairs reciprocally with 1 to no handrail and demonstrate proper mechanics with good eccentric control in order to show improved functional strength and ROM of the L knee so pt can access her upstairs with greater ease.    Time  6    Period  Weeks    Status  New      PT LONG TERM GOAL #4   Title  Pt will report being able to  sleep through the night without awakening due to pain in order to maximize overall recovery.    Time  6    Period  Weeks    Status  New            Plan - 06/09/17 1113    Clinical Impression Statement  Pt continues to present with increased edema proximal knee.  Pt brought in 2 pair today for therapist to assess, 1 TED hose and the other an 70year old pair with reports of sliding down thigh, pt encouraged to purchase new pair for edema control.  Reports plans to purchase later today.  Continued session focus wiht knee mobility with manual technqiues to address edema and STM to resolve spasms in knee.  Overall incision looks good with minimal adhesions over scar  tissue incision.  Added prone knee hang with manual to hamstrings.  AROM progressing well.  Added rockerboard to improve weight distibution with gait.  No reports of increased pain through session.      Rehab Potential  Good    PT Frequency  3x / week    PT Duration  6 weeks    PT Treatment/Interventions  Cryotherapy;Electrical Stimulation;Moist Heat;DME Instruction;Gait training;Stair training;Functional mobility training;Therapeutic activities;Therapeutic exercise;Balance training;Neuromuscular re-education;Patient/family education;Manual techniques;Scar mobilization;Passive range of motion;Dry needling;Energy conservation;Taping    PT Next Visit Plan  F/U with compression hose AGAIN.  Trial on reciprocal bike for mobility.  ROM progressing well, continue knee mobility exercises.  Continue manual PRN for edema/soft tissue restrictions.  Progress functional strengthening with sit to stand (limited by time this session), squat, step ups, rockerboard, and SLS for gait training and balance.      PT Home Exercise Plan  eval: quad sets, heel slides, knee drives on step, standing calf stretch on step; add theraband (green) exercises`       Patient will benefit from skilled therapeutic intervention in order to improve the following deficits and impairments:  Abnormal gait, Decreased activity tolerance, Decreased balance, Decreased endurance, Decreased mobility, Decreased range of motion, Decreased strength, Difficulty walking, Hypomobility, Increased edema, Increased fascial restricitons, Increased muscle spasms, Impaired flexibility, Improper body mechanics, Pain  Visit Diagnosis: Acute pain of left knee  Stiffness of left knee, not elsewhere classified  Localized edema  Muscle weakness (generalized)     Problem List Patient Active Problem List   Diagnosis Date Noted  . Unilateral primary osteoarthritis, left knee 05/02/2017  . S/P total knee replacement using cement, left 05/02/2017  .  Primary osteoarthritis of left knee 12/21/2016  . Chondromalacia patellae, left knee 05/26/2016  . Breast cancer of upper-inner quadrant of right female breast ([facility]) 11/04/2015  . GERD (gastroesophageal reflux disease) 09/02/2013  . Obesity 09/02/2013  . Ventral hernia, recurrent 09/02/2013  . Unspecified asthma(493.90) 12/12/2012  . Cough 03/27/2012  . Right middle lobe syndrome 03/27/2012   Ihor Austin, [place]; CBIS [phone] .  Aldona Lento 06/09/2017, 12:12 PM  [facility] [address] [place], [place], [address] Phone: [phone]   Fax:  [phone]  Name: Caitlin [name] MRN: [id number] Date of Birth: 1946/05/12

## 2017-06-12 ENCOUNTER — Encounter (HOSPITAL_COMMUNITY): Payer: Self-pay

## 2017-06-12 ENCOUNTER — Ambulatory Visit (HOSPITAL_COMMUNITY): Payer: Medicare Other

## 2017-06-12 DIAGNOSIS — M25562 Pain in left knee: Secondary | ICD-10-CM

## 2017-06-12 DIAGNOSIS — M25662 Stiffness of left knee, not elsewhere classified: Secondary | ICD-10-CM | POA: Diagnosis not present

## 2017-06-12 DIAGNOSIS — R600 Localized edema: Secondary | ICD-10-CM | POA: Diagnosis not present

## 2017-06-12 DIAGNOSIS — M6281 Muscle weakness (generalized): Secondary | ICD-10-CM

## 2017-06-12 DIAGNOSIS — I890 Lymphedema, not elsewhere classified: Secondary | ICD-10-CM | POA: Diagnosis not present

## 2017-06-12 NOTE — Therapy (Addendum)
[facility], [place], [address] Phone: [phone]   Fax:  [phone]  Physical Therapy Treatment/Reassessment  Patient Details  Name: Caitlin [name] MRN: [id number] Date of Birth: [birth year] Referring Provider: Joni Fears, MD   Encounter Date: 06/12/2017  PT End of Session - 06/12/17 1029    Visit Number  8    Number of Visits  18    Date for PT Re-Evaluation  07/03/17    Authorization Type  Medicare Part A and B    Authorization Time Period  05/22/17 to 07/03/17    Authorization - Visit Number  8    Authorization - Number of Visits  10    PT Start Time  1030    PT Stop Time  1112    PT Time Calculation (min)  42 min    Activity Tolerance  Patient tolerated treatment well;No increased pain    Behavior During Therapy  WFL for tasks assessed/performed       Past Medical History:  Diagnosis Date  . Arthritis   . Asthma   . Breast cancer ([place])   . Breast disorder    cancer  . Breast mass    benign  . GERD (gastroesophageal reflux disease)   . Heart murmur    AS CHILD  OUTGREW IT  . History of hiatal hernia   . Hyperlipidemia   . Hypertension   . Impaired fasting glucose   . Rectocele   . Varicose vein of leg     Past Surgical History:  Procedure Laterality Date  . BREAST SURGERY Left    breast biopies x4  . CHOLECYSTECTOMY  09/08/2010  . COLONOSCOPY    . HERNIA REPAIR  12/24/2010, 12-2014   TOTAL 4  . LAPAROSCOPIC GASTRIC SLEEVE RESECTION  11-2013   at [place]  2007/2008  . TONSILLECTOMY  1968  . TOTAL KNEE ARTHROPLASTY Left 05/02/2017  . UPPER GASTROINTESTINAL ENDOSCOPY    . VAGINAL HYSTERECTOMY  1980s    There were no vitals filed for this visit.  Subjective Assessment - 06/12/17 1030    Subjective  Pt states that her knee is doing well. She states that she ordered her compression garment this morning.     Patient Stated Goals  full motion of the  knee    Currently in Pain?  Yes    Pain Score  1     Pain Location  Knee    Pain Orientation  Left    Pain Descriptors / Indicators  Aching    Pain Type  Surgical pain    Pain Onset  More than a month ago    Pain Frequency  Intermittent    Aggravating Factors   knee flexion     Pain Relieving Factors  icing, sometimes Advil    Effect of Pain on Daily Activities  minimal         [facility] PT Assessment - 06/12/17 0001      Circumferential Edema   Circumferential - Right  17 inches joint line    Circumferential - Left   18 inches joint line      AROM   Left Knee Extension  3 was 11    Left Knee Flexion  115 was 103      Strength   Right Hip Extension  4/5 was 4-    Right Hip ABduction  4/5 was 4-    Left Hip Flexion  5/5 was 4+    Left Hip Extension  4/5 was 4-    Left Hip ABduction  4/5 was 4-    Left Knee Flexion  4+/5 was 4    Left Ankle Dorsiflexion  5/5 was 4+      Ambulation/Gait   Ambulation Distance (Feet)  762 Feet 3MWT    Assistive device  None    Gait Pattern  Step-through pattern;Trendelenburg    Gait Comments  decreased pelvic control, trendelenber throughout      Balance   Balance Assessed  Yes      Static Standing Balance   Static Standing - Balance Support  No upper extremity supported    Static Standing Balance -  Activities   Single Leg Stance - Right Leg;Single Leg Stance - Left Leg    Static Standing - Comment/# of Minutes  R: 5 sec or < L: 9 sec            [facility] Adult PT Treatment/Exercise - 06/12/17 0001      Knee/Hip Exercises: Stretches   Active Hamstring Stretch  Left;3 reps;30 seconds    Active Hamstring Stretch Limitations  12" step    Knee: Self-Stretch to increase Flexion  Left;10 seconds    Knee: Self-Stretch Limitations  knee drive on 16XW step 96E 10"    Gastroc Stretch  Both;3 reps;30 seconds    Gastroc Stretch Limitations  slant board      Knee/Hip Exercises: Aerobic   Stationary Bike  x5 mins at EOS for ROM, seat 12 (try  seat 10 next visit)             PT Education - 06/12/17 1033    Education provided  Yes    Education Details  reassessment findings, POC    Person(s) Educated  Patient    Methods  Explanation    Comprehension  Verbalized understanding       PT Short Term Goals - 06/12/17 1041      PT SHORT TERM GOAL #1   Title  Pt will be independent with HEP and perform consistently to maximzie return to PLOF.    Time  3    Period  Weeks    Status  Achieved      PT SHORT TERM GOAL #2   Title  Pt will have decreased L knee edema by 1" or > to maximize ROM and decrease pain.    Baseline  11/12: 18" joint line    Time  3    Period  Weeks    Status  On-going      PT SHORT TERM GOAL #3   Title  Pt will have improved AROM to at least 0-115 to maximize gait and stair ambulation.    Baseline  11/12: 3-115 AROM    Time  3    Period  Weeks    Status  Partially Met      PT SHORT TERM GOAL #4   Title  Pt will be able to perform SLS for 10 sec or > on BLE in order to maximize gait on uneven ground.    Baseline  11/12: R: 5 sec or <, L: 9 sec or <    Time  3    Period  Weeks    Status  On-going        PT Long Term Goals - 06/12/17 1041      PT LONG TERM GOAL #1   Title  Pt will have improved MMT  of all muscle groups tested to 5/5 in order to maximize gait and balance.     Time  6    Period  Weeks    Status  On-going      PT LONG TERM GOAL #2   Title  Pt will have improved 3MWT by 170f or greater with min to no gait deviations in order to maximize community access.     Time  6    Period  Weeks    Status  Achieved      PT LONG TERM GOAL #3   Title  Pt will be able to ascend/descend a full flight of stairs reciprocally with 1 to no handrail and demonstrate proper mechanics with good eccentric control in order to show improved functional strength and ROM of the L knee so pt can access her upstairs with greater ease.    Baseline  11/12: min deviations, mild increaed L knee valgus     Time  6    Period  Weeks    Status  Partially Met      PT LONG TERM GOAL #4   Title  Pt will report being able to sleep through the night without awakening due to pain in order to maximize overall recovery.    Baseline  11/12: still wakes up intermittently due to her L knee pain    Time  6    Period  Weeks    Status  On-going            Plan - 06/12/17 1107    Clinical Impression Statement  PT reassessed pt's goals and outcome measures this date. Pt has made good progress towards all of her goals as illustrated above. Her swelling has made minimal improvements since her initial evaluation and her ROM is 3-115. Her BLE strength is still limited, though she has made improvements in all muscle groups tested. She reports that she ordered her compression garment this morning so we will continue to f/u on when the pt receives it. Overall, pt making good progress and her POC will be continued as planned going forward. Ended session on the recumbent bike for ROM and pt tolerated very well.     Rehab Potential  Good    PT Frequency  3x / week    PT Duration  6 weeks    PT Treatment/Interventions  Cryotherapy;Electrical Stimulation;Moist Heat;DME Instruction;Gait training;Stair training;Functional mobility training;Therapeutic activities;Therapeutic exercise;Balance training;Neuromuscular re-education;Patient/family education;Manual techniques;Scar mobilization;Passive range of motion;Dry needling;Energy conservation;Taping    PT Next Visit Plan  F/U with compression garment and if pt received yet; Continue reciprocal bike for mobility.  ROM progressing well, continue knee mobility exercises.  Continue manual PRN for edema/soft tissue restrictions.  Progress functional strengthening with sit to stand, squat, step ups, rockerboard, and SLS for gait training and balance.      PT Home Exercise Plan  eval: quad sets, heel slides, knee drives on step, standing calf stretch on step; add theraband (green)  exercises`    Consulted and Agree with Plan of Care  Patient       Patient will benefit from skilled therapeutic intervention in order to improve the following deficits and impairments:  Abnormal gait, Decreased activity tolerance, Decreased balance, Decreased endurance, Decreased mobility, Decreased range of motion, Decreased strength, Difficulty walking, Hypomobility, Increased edema, Increased fascial restricitons, Increased muscle spasms, Impaired flexibility, Improper body mechanics, Pain  Visit Diagnosis: Acute pain of left knee  Stiffness of left knee, not elsewhere classified  Localized edema  Muscle weakness (generalized)   G-Codes - July 05, 2017 1109    Functional Assessment Tool Used (Outpatient Only)  clinical judgement, 5xSTS, 3MWT, SLS, MMT, ROM, gait    Functional Limitation  Mobility: Walking and moving around    Mobility: Walking and Moving Around Current Status [phone])  At least 20 percent but less than 40 percent impaired, limited or restricted    Mobility: Walking and Moving Around Goal Status [phone])  At least 1 percent but less than 20 percent impaired, limited or restricted       Problem List Patient Active Problem List   Diagnosis Date Noted  . Unilateral primary osteoarthritis, left knee 05/02/2017  . S/P total knee replacement using cement, left 05/02/2017  . Primary osteoarthritis of left knee 12/21/2016  . Chondromalacia patellae, left knee 05/26/2016  . Breast cancer of upper-inner quadrant of right female breast ([facility]) 11/04/2015  . GERD (gastroesophageal reflux disease) 09/02/2013  . Obesity 09/02/2013  . Ventral hernia, recurrent 09/02/2013  . Unspecified asthma(493.90) 12/12/2012  . Cough 03/27/2012  . Right middle lobe syndrome 03/27/2012      Geraldine Solar PT, DPT  [facility] [address] [place], [place], [address] Phone: [phone]   Fax:  [phone]  Name: Caitlin [name] MRN:  [id number] Date of Birth: Jun 08, 1946

## 2017-06-14 ENCOUNTER — Encounter (HOSPITAL_COMMUNITY): Payer: Self-pay

## 2017-06-14 ENCOUNTER — Ambulatory Visit (HOSPITAL_COMMUNITY): Payer: Medicare Other

## 2017-06-14 DIAGNOSIS — M25562 Pain in left knee: Secondary | ICD-10-CM

## 2017-06-14 DIAGNOSIS — R600 Localized edema: Secondary | ICD-10-CM | POA: Diagnosis not present

## 2017-06-14 DIAGNOSIS — I890 Lymphedema, not elsewhere classified: Secondary | ICD-10-CM | POA: Diagnosis not present

## 2017-06-14 DIAGNOSIS — M25662 Stiffness of left knee, not elsewhere classified: Secondary | ICD-10-CM | POA: Diagnosis not present

## 2017-06-14 DIAGNOSIS — M6281 Muscle weakness (generalized): Secondary | ICD-10-CM

## 2017-06-14 NOTE — Therapy (Signed)
[facility] [place], [place], [address] Phone: [phone]   Fax:  [phone]  Physical Therapy Treatment  Patient Details  Name: Caitlin [name] MRN: [id number] Date of Birth: 1946/04/06 Referring Provider: Joni Fears, MD   Encounter Date: 06/14/2017  PT End of Session - 06/14/17 1118    Visit Number  9    Number of Visits  18    Date for PT Re-Evaluation  07/03/17    Authorization Type  Medicare Part A and B    Authorization Time Period  05/22/17 to 07/03/17    Authorization - Visit Number  9    Authorization - Number of Visits  10    PT Start Time  0175    PT Stop Time  1115    PT Time Calculation (min)  42 min    Activity Tolerance  Patient tolerated treatment well;No increased pain    Behavior During Therapy  WFL for tasks assessed/performed       Past Medical History:  Diagnosis Date  . Arthritis   . Asthma   . Breast cancer ([place])   . Breast disorder    cancer  . Breast mass    benign  . GERD (gastroesophageal reflux disease)   . Heart murmur    AS CHILD  OUTGREW IT  . History of hiatal hernia   . Hyperlipidemia   . Hypertension   . Impaired fasting glucose   . Rectocele   . Varicose vein of leg     Past Surgical History:  Procedure Laterality Date  . BREAST SURGERY Left    breast biopies x4  . CHOLECYSTECTOMY  09/08/2010  . COLONOSCOPY    . HERNIA REPAIR  12/24/2010, 12-2014   TOTAL 4  . LAPAROSCOPIC GASTRIC SLEEVE RESECTION  11-2013   at [place]  2007/2008  . TONSILLECTOMY  1968  . TOTAL KNEE ARTHROPLASTY Left 05/02/2017  . UPPER GASTROINTESTINAL ENDOSCOPY    . VAGINAL HYSTERECTOMY  1980s    There were no vitals filed for this visit.  Subjective Assessment - 06/14/17 1036    Subjective  Pt states she continues to wake up at night but overall she is doing okay. She noted a pop the last session and was sore that evening but has resolved since.     Currently in Pain?  Yes    Pain Score  1                       [facility] Adult PT Treatment/Exercise - 06/14/17 0001      Knee/Hip Exercises: Stretches   Active Hamstring Stretch  Left;3 reps;30 seconds    Active Hamstring Stretch Limitations  12" step    Knee: Self-Stretch to increase Flexion  3 reps;30 seconds    Knee: Self-Stretch Limitations  knee drive, 12 in step    Gastroc Stretch  Both;3 reps;30 seconds    Gastroc Stretch Limitations  slant board      Knee/Hip Exercises: Aerobic   Stationary Bike  x5 min beginning seat 11 x 3 min, seat 10 x 2 min      Knee/Hip Exercises: Standing   Lateral Step Up  10 reps;Step Height: 4"    Forward Step Up  10 reps;Step Height: 4"    Step Down  10 reps;Step Height: 4"    Rocker Board  1 minute lateral, Forward/backward      Knee/Hip Exercises:  Seated   Long Arc Quad  Left;2 sets;15 reps 1#    Sit to [employer]  10 reps;2 sets 1 set with 2" step under Rt.       Knee/Hip Exercises: Supine   Knee Extension Limitations  5 degrees  0 degrees with Overpress    Knee Flexion Limitations  115      Manual Therapy   Manual Therapy  Edema management;Joint mobilization;Soft tissue mobilization    Manual therapy comments  performed separatley from all other skilled services     Joint Mobilization  Knee flexion in prone manual 5s hold, 10 reps    Soft tissue mobilization  soft tissue mobilization to hamstring and posterior knee in prone               PT Short Term Goals - 06/12/17 1041      PT SHORT TERM GOAL #1   Title  Pt will be independent with HEP and perform consistently to maximzie return to PLOF.    Time  3    Period  Weeks    Status  Achieved      PT SHORT TERM GOAL #2   Title  Pt will have decreased L knee edema by 1" or > to maximize ROM and decrease pain.    Baseline  11/12: 18" joint line    Time  3    Period  Weeks    Status  On-going      PT SHORT TERM GOAL #3   Title  Pt will have improved AROM to at least  0-115 to maximize gait and stair ambulation.    Baseline  11/12: 3-115 AROM    Time  3    Period  Weeks    Status  Partially Met      PT SHORT TERM GOAL #4   Title  Pt will be able to perform SLS for 10 sec or > on BLE in order to maximize gait on uneven ground.    Baseline  11/12: R: 5 sec or <, L: 9 sec or <    Time  3    Period  Weeks    Status  On-going        PT Long Term Goals - 06/12/17 1041      PT LONG TERM GOAL #1   Title  Pt will have improved MMT of all muscle groups tested to 5/5 in order to maximize gait and balance.     Time  6    Period  Weeks    Status  On-going      PT LONG TERM GOAL #2   Title  Pt will have improved 3MWT by 173f or greater with min to no gait deviations in order to maximize community access.     Time  6    Period  Weeks    Status  Achieved      PT LONG TERM GOAL #3   Title  Pt will be able to ascend/descend a full flight of stairs reciprocally with 1 to no handrail and demonstrate proper mechanics with good eccentric control in order to show improved functional strength and ROM of the L knee so pt can access her upstairs with greater ease.    Baseline  11/12: min deviations, mild increaed L knee valgus    Time  6    Period  Weeks    Status  Partially Met      PT LONG TERM GOAL #4   Title  Pt will report being able to sleep through the night without awakening due to pain in order to maximize overall recovery.    Baseline  11/12: still wakes up intermittently due to her L knee pain    Time  6    Period  Weeks    Status  On-going            Plan - 06/14/17 1118    Clinical Impression Statement  Pt tolerated session very well today with ability to complete full revolution on bike for warmup seat 11 and worked down to seat 10. Patient reports she is to be receiving compression garments this afternoon and PT should follow up next session regarding questions if appropriate. Patient tolerated addition of step strengthening exercises and  2" box under Rt. foot during functional sit to stand strengthening. Overall, pt tolerated session well with no increase in pain EOS.     Rehab Potential  Good    PT Frequency  3x / week    PT Duration  6 weeks    PT Treatment/Interventions  Cryotherapy;Electrical Stimulation;Moist Heat;DME Instruction;Gait training;Stair training;Functional mobility training;Therapeutic activities;Therapeutic exercise;Balance training;Neuromuscular re-education;Patient/family education;Manual techniques;Scar mobilization;Passive range of motion;Dry needling;Energy conservation;Taping    PT Next Visit Plan  F/U with compression garment and if pt received yet; Continue reciprocal bike for mobility.  ROM progressing well, continue knee mobility exercises.  Continue manual PRN for edema/soft tissue restrictions.  Continue to progress functional strengthening and SLS for gait training and balance.      PT Home Exercise Plan  eval: quad sets, heel slides, knee drives on step, standing calf stretch on step; add theraband (green) exercises`    Consulted and Agree with Plan of Care  Patient       Patient will benefit from skilled therapeutic intervention in order to improve the following deficits and impairments:  Abnormal gait, Decreased activity tolerance, Decreased balance, Decreased endurance, Decreased mobility, Decreased range of motion, Decreased strength, Difficulty walking, Hypomobility, Increased edema, Increased fascial restricitons, Increased muscle spasms, Impaired flexibility, Improper body mechanics, Pain  Visit Diagnosis: Acute pain of left knee  Stiffness of left knee, not elsewhere classified  Localized edema  Muscle weakness (generalized)  Lymphedema, not elsewhere classified     Problem List Patient Active Problem List   Diagnosis Date Noted  . Unilateral primary osteoarthritis, left knee 05/02/2017  . S/P total knee replacement using cement, left 05/02/2017  . Primary osteoarthritis of  left knee 12/21/2016  . Chondromalacia patellae, left knee 05/26/2016  . Breast cancer of upper-inner quadrant of right female breast ([place]) 11/04/2015  . GERD (gastroesophageal reflux disease) 09/02/2013  . Obesity 09/02/2013  . Ventral hernia, recurrent 09/02/2013  . Unspecified asthma(493.90) 12/12/2012  . Cough 03/27/2012  . Right middle lobe syndrome 03/27/2012   Starr Lake PT, DPT 11:23 AM, 06/14/17 [facility] [address] [place], [place], [address] Phone: [phone]   Fax:  [phone]  Name: ISAIAH TOROK MRN: [id number] Date of Birth: 1946/08/01

## 2017-06-16 ENCOUNTER — Ambulatory Visit (HOSPITAL_COMMUNITY): Payer: Medicare Other

## 2017-06-16 ENCOUNTER — Encounter (HOSPITAL_COMMUNITY): Payer: Self-pay

## 2017-06-16 DIAGNOSIS — M6281 Muscle weakness (generalized): Secondary | ICD-10-CM

## 2017-06-16 DIAGNOSIS — M25662 Stiffness of left knee, not elsewhere classified: Secondary | ICD-10-CM | POA: Diagnosis not present

## 2017-06-16 DIAGNOSIS — R600 Localized edema: Secondary | ICD-10-CM | POA: Diagnosis not present

## 2017-06-16 DIAGNOSIS — M25562 Pain in left knee: Secondary | ICD-10-CM | POA: Diagnosis not present

## 2017-06-16 DIAGNOSIS — I890 Lymphedema, not elsewhere classified: Secondary | ICD-10-CM | POA: Diagnosis not present

## 2017-06-16 NOTE — Therapy (Signed)
[place], [place], [address] Phone: [phone]   Fax:  [phone]  Physical Therapy Treatment  Patient Details  Name: Caitlin [name] MRN: [id number] Date of Birth: 1946/07/27 Referring Provider: Joni Fears, MD   Encounter Date: 06/16/2017  PT End of Session - 06/16/17 1034    Visit Number  10    Number of Visits  18    Date for PT Re-Evaluation  07/03/17    Authorization Type  Medicare Part A and B (g-codes on 10/03/2022 visit)    Authorization Time Period  05/22/17 to 07/03/17    Authorization - Visit Number  10    Authorization - Number of Visits  18    PT Start Time  1031    PT Stop Time  1112    PT Time Calculation (min)  41 min    Activity Tolerance  Patient tolerated treatment well;No increased pain    Behavior During Therapy  WFL for tasks assessed/performed       Past Medical History:  Diagnosis Date  . Arthritis   . Asthma   . Breast cancer ([place])   . Breast disorder    cancer  . Breast mass    benign  . GERD (gastroesophageal reflux disease)   . Heart murmur    AS CHILD  OUTGREW IT  . History of hiatal hernia   . Hyperlipidemia   . Hypertension   . Impaired fasting glucose   . Rectocele   . Varicose vein of leg     Past Surgical History:  Procedure Laterality Date  . BREAST SURGERY Left    breast biopies x4  . CHOLECYSTECTOMY  October 03, 2010  . COLONOSCOPY    . COLONOSCOPY N/A 09/25/2013   Performed by Rogene Houston, MD at [facility]  . HERNIA REPAIR  12/24/2010, 12-2014   TOTAL 4  . LAPAROSCOPIC GASTRIC SLEEVE RESECTION  11-2013   at [facility]  2007/2008  . LEFT TOTAL KNEE ARTHROPLASTY Left 05/02/2017   Performed by [name], MD at [facility]  LUMPECTOMY AND RADIOACTIVE SEED TARGETED SENTINEL LYMPH NODE BIOPSY Right 11/11/2015   Performed by Stark Klein, MD at [facility]  .  TONSILLECTOMY  1968  . TOTAL KNEE ARTHROPLASTY Left 05/02/2017  . UPPER GASTROINTESTINAL ENDOSCOPY    . VAGINAL HYSTERECTOMY  1980s    There were no vitals filed for this visit.  Subjective Assessment - 06/16/17 1035    Subjective  Pt states that her knee is okay today, only about a 1/10 knee pain.    Currently in Pain?  Yes    Pain Score  1     Pain Location  Knee    Pain Orientation  Left    Pain Descriptors / Indicators  Aching    Pain Type  Surgical pain    Pain Onset  More than a month ago    Pain Frequency  Intermittent    Aggravating Factors   knee flexion    Pain Relieving Factors  icing, sometimes advil    Effect of Pain on Daily Activities  minimal             [facility] Adult PT Treatment/Exercise - 06/16/17 0001      Knee/Hip Exercises: Stretches   Active Hamstring Stretch  Left;3 reps;30 seconds    Active Hamstring Stretch Limitations  12" step  Knee: Self-Stretch to increase Flexion  Left    Knee: Self-Stretch Limitations  10x10" knee drive, 12 in step    Gastroc Stretch  Both;3 reps;30 seconds    Gastroc Stretch Limitations  slant board      Knee/Hip Exercises: Aerobic   Stationary Bike  x5 min beginning, seat 10, for ROM      Knee/Hip Exercises: Standing   Knee Flexion  Left;2 sets;10 reps    Knee Flexion Limitations  2#    Terminal Knee Extension Limitations  10x10" with BTB    Lateral Step Up  Left;10 reps;Step Height: 4"    Forward Step Up  Left;10 reps;Step Height: 4"    SLS  5x10" holds, intermittent UE support    Other Standing Knee Exercises  L heel taps on 4" step x10 reps      Knee/Hip Exercises: Seated   Long Arc Quad  Left;2 sets;15 reps    Long Arc Quad Weight  2 lbs.    Long [employer] Limitations  3-5" holds      Knee/Hip Exercises: Supine   Short Arc [employer]  Left;2 sets;15 reps    Short Arc Quad Sets Limitations  2#, 3-5" holds    Knee Extension Limitations  5    Knee Flexion Limitations  121      Knee/Hip Exercises: Prone    Hamstring Curl  2 sets;10 reps    Hamstring Curl Limitations  2#            PT Education - 06/16/17 1035    Education provided  Yes    Education Details  exercise technique; add TKE to HEP    Person(s) Educated  Patient    Methods  Explanation;Demonstration;Handout    Comprehension  Verbalized understanding;Returned demonstration       PT Short Term Goals - 06/12/17 1041      PT SHORT TERM GOAL #1   Title  Pt will be independent with HEP and perform consistently to maximzie return to PLOF.    Time  3    Period  Weeks    Status  Achieved      PT SHORT TERM GOAL #2   Title  Pt will have decreased L knee edema by 1" or > to maximize ROM and decrease pain.    Baseline  11/12: 18" joint line    Time  3    Period  Weeks    Status  On-going      PT SHORT TERM GOAL #3   Title  Pt will have improved AROM to at least 0-115 to maximize gait and stair ambulation.    Baseline  11/12: 3-115 AROM    Time  3    Period  Weeks    Status  Partially Met      PT SHORT TERM GOAL #4   Title  Pt will be able to perform SLS for 10 sec or > on BLE in order to maximize gait on uneven ground.    Baseline  11/12: R: 5 sec or <, L: 9 sec or <    Time  3    Period  Weeks    Status  On-going        PT Long Term Goals - 06/12/17 1041      PT LONG TERM GOAL #1   Title  Pt will have improved MMT of all muscle groups tested to 5/5 in order to maximize gait and balance.     Time  6    Period  Weeks    Status  On-going      PT LONG TERM GOAL #2   Title  Pt will have improved 3MWT by 172f or greater with min to no gait deviations in order to maximize community access.     Time  6    Period  Weeks    Status  Achieved      PT LONG TERM GOAL #3   Title  Pt will be able to ascend/descend a full flight of stairs reciprocally with 1 to no handrail and demonstrate proper mechanics with good eccentric control in order to show improved functional strength and ROM of the L knee so pt can access her  upstairs with greater ease.    Baseline  11/12: min deviations, mild increaed L knee valgus    Time  6    Period  Weeks    Status  Partially Met      PT LONG TERM GOAL #4   Title  Pt will report being able to sleep through the night without awakening due to pain in order to maximize overall recovery.    Baseline  11/12: still wakes up intermittently due to her L knee pain    Time  6    Period  Weeks    Status  On-going            Plan - 06/16/17 1114    Clinical Impression Statement  Pt reports receiving her compression garment in the mail and is going to start wearing them this afternoon. Session continued to focus on knee mobility and strength as well as functional strengthening. Pt tolerated session well with no reports of increased pain, just difficulty with some of the exercises due to weakness. Attempted to increased pt's bike seat to 9 compared to 10, but pt had increased pain and compensation so returned it to seat 10.  Pt's AROM 5-121deg this date; she required max cues for proper quad set and to decrease glute compensation. Continue POC as planned.    Rehab Potential  Good    PT Frequency  3x / week    PT Duration  6 weeks    PT Treatment/Interventions  Cryotherapy;Electrical Stimulation;Moist Heat;DME Instruction;Gait training;Stair training;Functional mobility training;Therapeutic activities;Therapeutic exercise;Balance training;Neuromuscular re-education;Patient/family education;Manual techniques;Scar mobilization;Passive range of motion;Dry needling;Energy conservation;Taping    PT Next Visit Plan  ensure pt is wearing compression garment; reciprocal bike for mobility, extension ROM; continue manual PRN for edema/soft tissue restrictions. Progress functional strengthening and SLS for gait training and balance. add in sidestepping with band    PT Home Exercise Plan  eval: quad sets, heel slides, knee drives on step, standing calf stretch on step; add theraband (green)  exercises; 11/16: TKE     Consulted and Agree with Plan of Care  Patient       Patient will benefit from skilled therapeutic intervention in order to improve the following deficits and impairments:  Abnormal gait, Decreased activity tolerance, Decreased balance, Decreased endurance, Decreased mobility, Decreased range of motion, Decreased strength, Difficulty walking, Hypomobility, Increased edema, Increased fascial restricitons, Increased muscle spasms, Impaired flexibility, Improper body mechanics, Pain  Visit Diagnosis: Acute pain of left knee  Stiffness of left knee, not elsewhere classified  Localized edema  Muscle weakness (generalized)     Problem List Patient Active Problem List   Diagnosis Date Noted  . Unilateral primary osteoarthritis, left knee 05/02/2017  . S/P total knee replacement using cement, left 05/02/2017  .  Primary osteoarthritis of left knee 12/21/2016  . Chondromalacia patellae, left knee 05/26/2016  . Breast cancer of upper-inner quadrant of right female breast ([place]) 11/04/2015  . GERD (gastroesophageal reflux disease) 09/02/2013  . Obesity 09/02/2013  . Ventral hernia, recurrent 09/02/2013  . Unspecified asthma(493.90) 12/12/2012  . Cough 03/27/2012  . Right middle lobe syndrome 03/27/2012       Geraldine Solar PT, DPT  [facility] [address] [place], [place], [address] Phone: [phone]   Fax:  [phone]  Name: Caitlin [name] MRN: [id number] Date of Birth: April 24, 1946

## 2017-06-16 NOTE — Patient Instructions (Addendum)
  Terminal knee extension -TKE  Place tubing/band around involved knee.  Stand with knee slightly bent.  Tighten the quadriceps (the muscle on the front) to straighten the knee. Be sure to stay standing up tall, don't stick your butt back to straighten the knee.   Add in with other exercises, 2-3 sets of 10-15 reps holding for 10-15 seconds each

## 2017-06-19 ENCOUNTER — Encounter (HOSPITAL_COMMUNITY): Payer: Self-pay

## 2017-06-19 ENCOUNTER — Ambulatory Visit (HOSPITAL_COMMUNITY): Payer: Medicare Other

## 2017-06-19 ENCOUNTER — Telehealth (HOSPITAL_COMMUNITY): Payer: Self-pay | Admitting: Family Medicine

## 2017-06-19 DIAGNOSIS — M25662 Stiffness of left knee, not elsewhere classified: Secondary | ICD-10-CM

## 2017-06-19 DIAGNOSIS — R600 Localized edema: Secondary | ICD-10-CM

## 2017-06-19 DIAGNOSIS — I890 Lymphedema, not elsewhere classified: Secondary | ICD-10-CM | POA: Diagnosis not present

## 2017-06-19 DIAGNOSIS — M25562 Pain in left knee: Secondary | ICD-10-CM | POA: Diagnosis not present

## 2017-06-19 DIAGNOSIS — M6281 Muscle weakness (generalized): Secondary | ICD-10-CM

## 2017-06-19 NOTE — Therapy (Signed)
[place], [place], [address] Phone: [phone]   Fax:  [phone]  Physical Therapy Treatment  Patient Details  Name: Caitlin [name] MRN: [id number] Date of Birth: 09/11/1945 Referring Provider: Joni Fears, MD   Encounter Date: 06/19/2017  PT End of Session - 06/19/17 1038    Visit Number  11    Number of Visits  18    Date for PT Re-Evaluation  07/03/17    Authorization Type  Medicare Part A and B (g-codes on 03-Sep-2022 visit)    Authorization Time Period  05/22/17 to 07/03/17    Authorization - Visit Number  10    Authorization - Number of Visits  18    PT Start Time  7622    PT Stop Time  1116    PT Time Calculation (min)  41 min    Activity Tolerance  Patient tolerated treatment well;No increased pain    Behavior During Therapy  WFL for tasks assessed/performed       Past Medical History:  Diagnosis Date  . Arthritis   . Asthma   . Breast cancer ([place])   . Breast disorder    cancer  . Breast mass    benign  . GERD (gastroesophageal reflux disease)   . Heart murmur    AS CHILD  OUTGREW IT  . History of hiatal hernia   . Hyperlipidemia   . Hypertension   . Impaired fasting glucose   . Rectocele   . Varicose vein of leg     Past Surgical History:  Procedure Laterality Date  . BREAST SURGERY Left    breast biopies x4  . CHOLECYSTECTOMY  09/08/2010  . COLONOSCOPY    . COLONOSCOPY N/A 09/25/2013   Performed by Rogene Houston, MD at [facility]  . HERNIA REPAIR  12/24/2010, 12-2014   TOTAL 4  . LAPAROSCOPIC GASTRIC SLEEVE RESECTION  11-2013   at [facility]  2007/2008  . LEFT TOTAL KNEE ARTHROPLASTY Left 05/02/2017   Performed by [name], MD at [facility]  LUMPECTOMY AND RADIOACTIVE SEED TARGETED SENTINEL LYMPH NODE BIOPSY Right 11/11/2015   Performed by Stark Klein, MD at [facility]  .  TONSILLECTOMY  1968  . TOTAL KNEE ARTHROPLASTY Left 05/02/2017  . UPPER GASTROINTESTINAL ENDOSCOPY    . VAGINAL HYSTERECTOMY  1980s    There were no vitals filed for this visit.  Subjective Assessment - 06/19/17 1037    Subjective  pt reports that she is doing well this morning. No knee pain.    Currently in Pain?  No/denies    Pain Onset  More than a month ago            [facility] Adult PT Treatment/Exercise - 06/19/17 0001      Knee/Hip Exercises: Stretches   Active Hamstring Stretch  Left;3 reps;30 seconds    Active Hamstring Stretch Limitations  12" step    Gastroc Stretch  Both;3 reps;30 seconds    Gastroc Stretch Limitations  on 4" stairs      Knee/Hip Exercises: Aerobic   Stationary Bike  x5 min beginning, seat 9, for ROM      Knee/Hip Exercises: Machines for Strengthening   Cybex Knee Extension  3 plates, x10 reps, BLE together    Cybex Knee Flexion  3 plates, x10 reps, BLE together  Knee/Hip Exercises: Standing   Lateral Step Up  Left;10 reps;Hand Hold: 0;Step Height: 6"    Forward Step Up  Left;10 reps;Hand Hold: 0;Step Height: 6"    Functional Squat  2 sets;10 reps    Functional Squat Limitations  2nd set in front of mirror    Other Standing Knee Exercises  L heel taps on 6" step x10 reps    Other Standing Knee Exercises  sidestep with RTB 8f x2RT      Knee/Hip Exercises: Seated   Long Arc Quad  Left;20 reps    Long Arc Quad Weight  -- 2.5#    Long [employer]  3-5" holds      Knee/Hip Exercises: Supine   Short Arc [employer] Left;20 reps    Short Arc Quad Sets Limitations  2.5#, 3-5" holds    Straight Leg Raises  Left;15 reps    Knee Extension Limitations  3    Knee Flexion Limitations  120      Manual Therapy   Manual Therapy  Joint mobilization    Manual therapy comments  performed separatley from all other skilled services     Joint Mobilization  Grade III-IV Anterior joint mobs for extension and tibial ER with leg extended for end  range extension ROM            PT Education - 06/19/17 1038    Education provided  Yes    Person(s) Educated  Patient    Methods  Explanation;Demonstration    Comprehension  Verbalized understanding;Returned demonstration       PT Short Term Goals - 06/12/17 1041      PT SHORT TERM GOAL #1   Title  Pt will be independent with HEP and perform consistently to maximzie return to PLOF.    Time  3    Period  Weeks    Status  Achieved      PT SHORT TERM GOAL #2   Title  Pt will have decreased L knee edema by 1" or > to maximize ROM and decrease pain.    Baseline  11/12: 18" joint line    Time  3    Period  Weeks    Status  On-going      PT SHORT TERM GOAL #3   Title  Pt will have improved AROM to at least 0-115 to maximize gait and stair ambulation.    Baseline  11/12: 3-115 AROM    Time  3    Period  Weeks    Status  Partially Met      PT SHORT TERM GOAL #4   Title  Pt will be able to perform SLS for 10 sec or > on BLE in order to maximize gait on uneven ground.    Baseline  11/12: R: 5 sec or <, L: 9 sec or <    Time  3    Period  Weeks    Status  On-going        PT Long Term Goals - 06/12/17 1041      PT LONG TERM GOAL #1   Title  Pt will have improved MMT of all muscle groups tested to 5/5 in order to maximize gait and balance.     Time  6    Period  Weeks    Status  On-going      PT LONG TERM GOAL #2   Title  Pt will have improved 3MWT by 1056for greater with  min to no gait deviations in order to maximize community access.     Time  6    Period  Weeks    Status  Achieved      PT LONG TERM GOAL #3   Title  Pt will be able to ascend/descend a full flight of stairs reciprocally with 1 to no handrail and demonstrate proper mechanics with good eccentric control in order to show improved functional strength and ROM of the L knee so pt can access her upstairs with greater ease.    Baseline  11/12: min deviations, mild increaed L knee valgus    Time  6     Period  Weeks    Status  Partially Met      PT LONG TERM GOAL #4   Title  Pt will report being able to sleep through the night without awakening due to pain in order to maximize overall recovery.    Baseline  11/12: still wakes up intermittently due to her L knee pain    Time  6    Period  Weeks    Status  On-going            Plan - 06/19/17 1117    Clinical Impression Statement  Pt reports using her compression garment over the weekend and does not have any questions regarding. Session focused on improving knee mobility, knee extension, hip and functional strengthening. Pt did not have any pain throughout the exercises, just reported some fatigue. Introduced pt to cybex machines for quad and HS strengthening with good tolerance this date. AROM 3 to 120 this date. Continue to progress pt's sessions as tolerated.     Rehab Potential  Good    PT Frequency  3x / week    PT Duration  6 weeks    PT Treatment/Interventions  Cryotherapy;Electrical Stimulation;Moist Heat;DME Instruction;Gait training;Stair training;Functional mobility training;Therapeutic activities;Therapeutic exercise;Balance training;Neuromuscular re-education;Patient/family education;Manual techniques;Scar mobilization;Passive range of motion;Dry needling;Energy conservation;Taping    PT Next Visit Plan  reciprocal bike for mobility, extension ROM; Progress functional strengthening and SLS for gait training and balance. add in lunging, prone TKE; continue manual for joint mobs;     PT Home Exercise Plan  eval: quad sets, heel slides, knee drives on step, standing calf stretch on step; add theraband (green) exercises; 11/16: TKE     Consulted and Agree with Plan of Care  Patient       Patient will benefit from skilled therapeutic intervention in order to improve the following deficits and impairments:  Abnormal gait, Decreased activity tolerance, Decreased balance, Decreased endurance, Decreased mobility, Decreased range of  motion, Decreased strength, Difficulty walking, Hypomobility, Increased edema, Increased fascial restricitons, Increased muscle spasms, Impaired flexibility, Improper body mechanics, Pain  Visit Diagnosis: Acute pain of left knee  Stiffness of left knee, not elsewhere classified  Localized edema  Muscle weakness (generalized)     Problem List Patient Active Problem List   Diagnosis Date Noted  . Unilateral primary osteoarthritis, left knee 05/02/2017  . S/P total knee replacement using cement, left 05/02/2017  . Primary osteoarthritis of left knee 12/21/2016  . Chondromalacia patellae, left knee 05/26/2016  . Breast cancer of upper-inner quadrant of right female breast ([place]) 11/04/2015  . GERD (gastroesophageal reflux disease) 09/02/2013  . Obesity 09/02/2013  . Ventral hernia, recurrent 09/02/2013  . Unspecified asthma(493.90) 12/12/2012  . Cough 03/27/2012  . Right middle lobe syndrome 03/27/2012       Geraldine Solar PT, DPT  [facility] [place], [place], [address] Phone: [phone]   Fax:  [phone]  Name: Caitlin [name] MRN: [id number] Date of Birth: [birth year]

## 2017-06-20 ENCOUNTER — Encounter (HOSPITAL_COMMUNITY): Payer: Medicare Other | Admitting: Physical Therapy

## 2017-06-21 ENCOUNTER — Encounter (HOSPITAL_COMMUNITY): Payer: Medicare Other

## 2017-06-26 ENCOUNTER — Encounter (HOSPITAL_COMMUNITY): Payer: Self-pay | Admitting: Physical Therapy

## 2017-06-26 ENCOUNTER — Ambulatory Visit (HOSPITAL_COMMUNITY): Payer: Medicare Other | Admitting: Physical Therapy

## 2017-06-26 DIAGNOSIS — I890 Lymphedema, not elsewhere classified: Secondary | ICD-10-CM | POA: Diagnosis not present

## 2017-06-26 DIAGNOSIS — R600 Localized edema: Secondary | ICD-10-CM | POA: Diagnosis not present

## 2017-06-26 DIAGNOSIS — M25562 Pain in left knee: Secondary | ICD-10-CM | POA: Diagnosis not present

## 2017-06-26 DIAGNOSIS — M6281 Muscle weakness (generalized): Secondary | ICD-10-CM | POA: Diagnosis not present

## 2017-06-26 DIAGNOSIS — M25662 Stiffness of left knee, not elsewhere classified: Secondary | ICD-10-CM | POA: Diagnosis not present

## 2017-06-26 NOTE — Therapy (Signed)
[place], [place], [address] Phone: [phone]   Fax:  [phone]  Physical Therapy Treatment  Patient Details  Name: Caitlin [name] MRN: [id number] Date of Birth: [birth year] Referring Provider: Joni Fears, MD   Encounter Date: 06/26/2017  PT End of Session - 06/26/17 1113    Visit Number  12    Number of Visits  18    Date for PT Re-Evaluation  07/03/17    Authorization Type  Medicare Part A and B (g-codes on 10-06-22 visit)    Authorization Time Period  05/22/17 to 07/03/17    Authorization - Visit Number  12    Authorization - Number of Visits  18    PT Start Time  1035    PT Stop Time  1113    PT Time Calculation (min)  38 min    Activity Tolerance  Patient tolerated treatment well    Behavior During Therapy  [facility] for tasks assessed/performed       Past Medical History:  Diagnosis Date  . Arthritis   . Asthma   . Breast cancer ([place])   . Breast disorder    cancer  . Breast mass    benign  . GERD (gastroesophageal reflux disease)   . Heart murmur    AS CHILD  OUTGREW IT  . History of hiatal hernia   . Hyperlipidemia   . Hypertension   . Impaired fasting glucose   . Rectocele   . Varicose vein of leg     Past Surgical History:  Procedure Laterality Date  . BREAST LUMPECTOMY WITH RADIOACTIVE SEED AND SENTINEL LYMPH NODE BIOPSY Right 11/11/2015   Procedure: RIGHT BREAST RADIOACTIVE SEED GUIDED  LUMPECTOMY AND RADIOACTIVE SEED TARGETED SENTINEL LYMPH NODE BIOPSY;  Surgeon: Stark Klein, MD;  Location: [facility];  Service: General;  Laterality: Right;  . BREAST SURGERY Left    breast biopies x4  . CHOLECYSTECTOMY  Oct 06, 2010  . COLONOSCOPY    . COLONOSCOPY N/A 09/25/2013   Procedure: COLONOSCOPY;  Surgeon: Rogene Houston, MD;  [facility];  Service: Endoscopy;  Laterality: N/A;  100-moved to 1200 Ann to notify pt  . HERNIA REPAIR  12/24/2010, 12-2014   TOTAL 4  .  LAPAROSCOPIC GASTRIC SLEEVE RESECTION  11-2013   at [place]  2007/2008  . TONSILLECTOMY  1968  . TOTAL KNEE ARTHROPLASTY Left 05/02/2017  . TOTAL KNEE ARTHROPLASTY Left 05/02/2017   Procedure: LEFT TOTAL KNEE ARTHROPLASTY;  Surgeon: [name], MD;  Location: [facility];  Service: Orthopedics;  Laterality: Left;  . UPPER GASTROINTESTINAL ENDOSCOPY    . VAGINAL HYSTERECTOMY  1980s    There were no vitals filed for this visit.  Subjective Assessment - 06/26/17 1036    Subjective  Patient arrives stating she is doing well, balance is one of her main concerns right now, stairs are going OK     Patient Stated Goals  full motion of the knee    Currently in Pain?  No/denies                      [facility] Adult PT Treatment/Exercise - 06/26/17 0001      Knee/Hip Exercises: Stretches   Active Hamstring Stretch  Left;3 reps;30 seconds    Active Hamstring Stretch Limitations  12" step    Knee: Self-Stretch to increase Flexion  Left    Knee: Self-Stretch  Limitations  10x10" knee drive, 12 in step      Knee/Hip Exercises: Standing   Heel Raises  Both;1 set;20 reps    Heel Raises Limitations  heel and toe, no HHA     Forward Lunges  Both;1 set;15 reps 4 inch box U HHA     Lateral Step Up  Both;1 set;15 reps;Step Height: 6"    Forward Step Up  Both;1 set;15 reps;Step Height: 6"    Step Down  Both;1 set;15 reps;Step Height: 4";Hand Hold: 1    Rocker Board  2 minutes AP and lateral           Balance Exercises - 06/26/17 1101      Balance Exercises: Standing   Tandem Stance  Eyes open;Foam/compliant surface;3 reps;15 secs    SLS  Eyes open;Solid surface;3 reps;10 secs    Tandem Gait  Forward;Retro;4 reps;Other (comment) // bars     Retro Gait  4 reps;Other (comment) // bars     Marching Limitations  1x20 B foam pad     Heel Raises Limitations  1x20 on foam         PT Education - 06/26/17 1113    Education provided  Yes     Education Details  possible re-assess/early DC next session     Person(s) Educated  Patient    Methods  Explanation    Comprehension  Verbalized understanding       PT Short Term Goals - 06/12/17 1041      PT SHORT TERM GOAL #1   Title  Pt will be independent with HEP and perform consistently to maximzie return to PLOF.    Time  3    Period  Weeks    Status  Achieved      PT SHORT TERM GOAL #2   Title  Pt will have decreased L knee edema by 1" or > to maximize ROM and decrease pain.    Baseline  11/12: 18" joint line    Time  3    Period  Weeks    Status  On-going      PT SHORT TERM GOAL #3   Title  Pt will have improved AROM to at least 0-115 to maximize gait and stair ambulation.    Baseline  11/12: 3-115 AROM    Time  3    Period  Weeks    Status  Partially Met      PT SHORT TERM GOAL #4   Title  Pt will be able to perform SLS for 10 sec or > on BLE in order to maximize gait on uneven ground.    Baseline  11/12: R: 5 sec or <, L: 9 sec or <    Time  3    Period  Weeks    Status  On-going        PT Long Term Goals - 06/12/17 1041      PT LONG TERM GOAL #1   Title  Pt will have improved MMT of all muscle groups tested to 5/5 in order to maximize gait and balance.     Time  6    Period  Weeks    Status  On-going      PT LONG TERM GOAL #2   Title  Pt will have improved 3MWT by 164f or greater with min to no gait deviations in order to maximize community access.     Time  6    Period  Weeks  Status  Achieved      PT LONG TERM GOAL #3   Title  Pt will be able to ascend/descend a full flight of stairs reciprocally with 1 to no handrail and demonstrate proper mechanics with good eccentric control in order to show improved functional strength and ROM of the L knee so pt can access her upstairs with greater ease.    Baseline  11/12: min deviations, mild increaed L knee valgus    Time  6    Period  Weeks    Status  Partially Met      PT LONG TERM GOAL #4    Title  Pt will report being able to sleep through the night without awakening due to pain in order to maximize overall recovery.    Baseline  11/12: still wakes up intermittently due to her L knee pain    Time  6    Period  Weeks    Status  On-going            Plan - 06/26/17 1114    Clinical Impression Statement  Continued advancing functional strength this session as indicated in PT POC; also worked on Hotel manager today as patient reports this is of concern to her as well. Patient doing very well moving forward and remains motivated to continue with skilled PT services. Consider re-assessment and possible early DC next session due to patient satisfaction with current level of function/high functional status.     Rehab Potential  Good    PT Frequency  3x / week    PT Duration  6 weeks    PT Treatment/Interventions  Cryotherapy;Electrical Stimulation;Moist Heat;DME Instruction;Gait training;Stair training;Functional mobility training;Therapeutic activities;Therapeutic exercise;Balance training;Neuromuscular re-education;Patient/family education;Manual techniques;Scar mobilization;Passive range of motion;Dry needling;Energy conservation;Taping    PT Next Visit Plan  consider early re-assess and DC due to patient satisfaction/high functional status. Add more balance HEP/increase balance focus     PT Home Exercise Plan  eval: quad sets, heel slides, knee drives on step, standing calf stretch on step; add theraband (green) exercises; 11/16: TKE     Consulted and Agree with Plan of Care  Patient       Patient will benefit from skilled therapeutic intervention in order to improve the following deficits and impairments:  Abnormal gait, Decreased activity tolerance, Decreased balance, Decreased endurance, Decreased mobility, Decreased range of motion, Decreased strength, Difficulty walking, Hypomobility, Increased edema, Increased fascial restricitons, Increased muscle spasms, Impaired  flexibility, Improper body mechanics, Pain  Visit Diagnosis: Acute pain of left knee  Stiffness of left knee, not elsewhere classified  Localized edema  Muscle weakness (generalized)     Problem List Patient Active Problem List   Diagnosis Date Noted  . Unilateral primary osteoarthritis, left knee 05/02/2017  . S/P total knee replacement using cement, left 05/02/2017  . Primary osteoarthritis of left knee 12/21/2016  . Chondromalacia patellae, left knee 05/26/2016  . Breast cancer of upper-inner quadrant of right female breast ([place]) 11/04/2015  . GERD (gastroesophageal reflux disease) 09/02/2013  . Obesity 09/02/2013  . Ventral hernia, recurrent 09/02/2013  . Unspecified asthma(493.90) 12/12/2012  . Cough 03/27/2012  . Right middle lobe syndrome 03/27/2012    Deniece Ree PT, DPT, CBIS  Supplemental Physical Therapist [place], [place], [address] Phone: [phone]   Fax:  [phone]  Name: Caitlin [name] MRN: [id number] Date of Birth: 1946-05-09

## 2017-06-28 ENCOUNTER — Ambulatory Visit (INDEPENDENT_AMBULATORY_CARE_PROVIDER_SITE_OTHER): Payer: Medicare Other | Admitting: Orthopaedic Surgery

## 2017-06-28 ENCOUNTER — Ambulatory Visit (HOSPITAL_COMMUNITY): Payer: Medicare Other

## 2017-06-28 ENCOUNTER — Encounter (INDEPENDENT_AMBULATORY_CARE_PROVIDER_SITE_OTHER): Payer: Self-pay | Admitting: Orthopaedic Surgery

## 2017-06-28 VITALS — BP 112/65 | HR 95 | Resp 14 | Ht 64.0 in | Wt 198.0 lb

## 2017-06-28 DIAGNOSIS — R600 Localized edema: Secondary | ICD-10-CM | POA: Diagnosis not present

## 2017-06-28 DIAGNOSIS — M25662 Stiffness of left knee, not elsewhere classified: Secondary | ICD-10-CM | POA: Diagnosis not present

## 2017-06-28 DIAGNOSIS — M25562 Pain in left knee: Secondary | ICD-10-CM

## 2017-06-28 DIAGNOSIS — I890 Lymphedema, not elsewhere classified: Secondary | ICD-10-CM

## 2017-06-28 DIAGNOSIS — Z96652 Presence of left artificial knee joint: Secondary | ICD-10-CM

## 2017-06-28 DIAGNOSIS — M6281 Muscle weakness (generalized): Secondary | ICD-10-CM | POA: Diagnosis not present

## 2017-06-28 NOTE — Patient Instructions (Signed)
  LOOPED ELASTIC BAND HIP ABDUCTION  While standing with an elastic band looped around your ankles, move the target leg out to the side as shown.    lateral walk at pole or counter   Standing at a support surface (role or counter top) use hands to assist with balance. Perform sideways walking down and back until you feel that you need to take break.   It may be help to place a chair close to allow for safe rest breaks.   5-10 laps depending on how far you walk    FRONT STEP-UPS  Step up onto stool or step with involved leg.  Step down leading with uninvolved leg.  Repeat.   LATERAL STEP-UPS  Stand on stool or step with involved leg.  Lower the uninvolved leg until the heel touches the floor, then press back up using the muscles in the involved leg only.  Repeat.   SIT TO STAND - NO SUPPORT  Start by scooting close to the front of the chair.  Next, lean forward at your trunk and reach forward with your arms and rise to standing without using your hands to push off from the chair or other object.   Use your arms as a counter-balance by reaching forward when in sitting and lower them as you approach standing.   Can add weights as body weight gets easier. Can take 5 seconds to lower yourself down to increase difficulty    Perform strengthening exercises at least every other day, 3-4 sets of 10-15 reps of each  Stretch every day!

## 2017-06-28 NOTE — Progress Notes (Signed)
Office Visit Note   Patient: Caitlin [name]           Date of Birth: [birth year]           MRN: [id number] Visit Date: 06/28/2017              Requested by: Sharilyn Sites, [place], [place] [address] PCP: Sharilyn Sites, MD   Assessment & Plan: Visit Diagnoses:  1. History of left knee replacement     Plan: 2 months status post left knee replacement doing very well. No longer uses any ambulatory aid. No pain medicines. Very happy with the results. Continue home excise program and will return in 2 months  Follow-Up Instructions: Return in about 2 months (around 08/28/2017).   Orders:  No orders of the defined types were placed in this encounter.  No orders of the defined types were placed in this encounter.     Procedures: No procedures performed   Clinical Data: No additional findings.   Subjective: Chief Complaint  Patient presents with  . Left Knee - Routine Post Op    Caitlin [name] is a 72 y o S/P 8 weeks left TKA. She is very happy with her results. 1 more session of PT with a home regime  Very happy with progress. No significant pain and certainly much better than before surgery. Denies shortness of breath or chest pain. No distal edema or calf pain  HPI  Review of Systems  Constitutional: Negative for chills, fatigue and fever.  Eyes: Negative for itching.  Respiratory: Negative for chest tightness and shortness of breath.   Cardiovascular: Negative for chest pain, palpitations and leg swelling.  Gastrointestinal: Negative for blood in stool, constipation and diarrhea.  Endocrine: Negative for polyuria.  Genitourinary: Negative for dysuria.  Musculoskeletal: Negative for back pain, joint swelling, neck pain and neck stiffness.  Allergic/Immunologic: Negative for immunocompromised state.  Neurological: Negative for dizziness and numbness.  Hematological: Does not bruise/bleed easily.  Psychiatric/Behavioral: The patient is not  nervous/anxious.      Objective: Vital Signs: BP 112/65   Pulse 95   Resp 14   Ht 5\' 4"  (1.626 m)   Wt 198 lb (89.8 kg)   BMI 33.99 kg/m   Physical Exam  Ortho Exam left knee incision healing without problems. No knee effusion. Left knee was not hot red warm or swollen. No instability. Full quick extension and flexion over 100. No calf pain. No distal edema.  Specialty Comments:  No specialty comments available.  Imaging: No results found.   PMFS History: Patient Active Problem List   Diagnosis Date Noted  . Unilateral primary osteoarthritis, left knee 05/02/2017  . S/P total knee replacement using cement, left 05/02/2017  . Primary osteoarthritis of left knee 12/21/2016  . Chondromalacia patellae, left knee 05/26/2016  . Breast cancer of upper-inner quadrant of right female breast ([place]) 11/04/2015  . GERD (gastroesophageal reflux disease) 09/02/2013  . Obesity 09/02/2013  . Ventral hernia, recurrent 09/02/2013  . Unspecified asthma(493.90) 12/12/2012  . Cough 03/27/2012  . Right middle lobe syndrome 03/27/2012   Past Medical History:  Diagnosis Date  . Arthritis   . Asthma   . Breast cancer ([place])   . Breast disorder    cancer  . Breast mass    benign  . GERD (gastroesophageal reflux disease)   . Heart murmur    AS CHILD  OUTGREW IT  . History of hiatal hernia   . Hyperlipidemia   .  Hypertension   . Impaired fasting glucose   . Rectocele   . Varicose vein of leg     Family History  Problem Relation Age of Onset  . Colon cancer Father   . Rectal cancer Father   . Prostate cancer Father   . Dementia Mother   . Osteoporosis Mother   . Healthy Daughter   . Healthy Son   . Pancreatic cancer Paternal Grandfather   . Heart disease Paternal Grandmother   . Heart disease Maternal Grandfather   . Breast cancer Maternal Grandmother   . Scoliosis Sister     Past Surgical History:  Procedure Laterality Date  . BREAST LUMPECTOMY WITH RADIOACTIVE SEED AND  SENTINEL LYMPH NODE BIOPSY Right 11/11/2015   Procedure: RIGHT BREAST RADIOACTIVE SEED GUIDED  LUMPECTOMY AND RADIOACTIVE SEED TARGETED SENTINEL LYMPH NODE BIOPSY;  Surgeon: Stark Klein, MD;  Location: [facility];  Service: General;  Laterality: Right;  . BREAST SURGERY Left    breast biopies x4  . CHOLECYSTECTOMY  09/08/2010  . COLONOSCOPY    . COLONOSCOPY N/A 09/25/2013   Procedure: COLONOSCOPY;  Surgeon: Rogene Houston, MD;  [facility];  Service: Endoscopy;  Laterality: N/A;  100-moved to 1200 Ann to notify pt  . HERNIA REPAIR  12/24/2010, 12-2014   TOTAL 4  . LAPAROSCOPIC GASTRIC SLEEVE RESECTION  11-2013   at [place]  2007/2008  . TONSILLECTOMY  1968  . TOTAL KNEE ARTHROPLASTY Left 05/02/2017  . TOTAL KNEE ARTHROPLASTY Left 05/02/2017   Procedure: LEFT TOTAL KNEE ARTHROPLASTY;  Surgeon: [name], MD;  Location: [facility];  Service: Orthopedics;  Laterality: Left;  . UPPER GASTROINTESTINAL ENDOSCOPY    . VAGINAL HYSTERECTOMY  1980s   Social History   Occupational History  . Occupation: retired    Fish farm manager: RETIRED    Comment: teacher  Tobacco Use  . Smoking status: Never Smoker  . Smokeless tobacco: Never Used  Substance and Sexual Activity  . Alcohol use: No  . Drug use: No  . Sexual activity: Yes    Birth control/protection: Surgical    Comment: hyst

## 2017-06-28 NOTE — Therapy (Signed)
[place], [place], [address] Phone: [phone]   Fax:  [phone]  Physical Therapy Treatment/Discharge Summary  Patient Details  Name: Caitlin [name] MRN: [id number] Date of Birth: Dec 12, 1945 Referring Provider: Joni Fears, MD   Encounter Date: 06/28/2017  PT End of Session - 06/28/17 1301    Visit Number  13    Number of Visits  18    Date for PT Re-Evaluation  07/03/17    Authorization Type  Medicare Part A and B (g-codes on 10-06-22 visit)    Authorization Time Period  05/22/17 to 07/03/17    Authorization - Visit Number  13    Authorization - Number of Visits  18    PT Start Time  1259    PT Stop Time  1331    PT Time Calculation (min)  32 min    Activity Tolerance  Patient tolerated treatment well    Behavior During Therapy  [facility] for tasks assessed/performed       Past Medical History:  Diagnosis Date  . Arthritis   . Asthma   . Breast cancer ([place])   . Breast disorder    cancer  . Breast mass    benign  . GERD (gastroesophageal reflux disease)   . Heart murmur    AS CHILD  OUTGREW IT  . History of hiatal hernia   . Hyperlipidemia   . Hypertension   . Impaired fasting glucose   . Rectocele   . Varicose vein of leg     Past Surgical History:  Procedure Laterality Date  . BREAST LUMPECTOMY WITH RADIOACTIVE SEED AND SENTINEL LYMPH NODE BIOPSY Right 11/11/2015   Procedure: RIGHT BREAST RADIOACTIVE SEED GUIDED  LUMPECTOMY AND RADIOACTIVE SEED TARGETED SENTINEL LYMPH NODE BIOPSY;  Surgeon: Stark Klein, MD;  Location: [facility];  Service: General;  Laterality: Right;  . BREAST SURGERY Left    breast biopies x4  . CHOLECYSTECTOMY  10/06/2010  . COLONOSCOPY    . COLONOSCOPY N/A 09/25/2013   Procedure: COLONOSCOPY;  Surgeon: Rogene Houston, MD;  [facility];  Service: Endoscopy;  Laterality: N/A;  100-moved to 1200 Ann to notify pt  . HERNIA REPAIR  12/24/2010, 12-2014    TOTAL 4  . LAPAROSCOPIC GASTRIC SLEEVE RESECTION  11-2013   at [place]  2007/2008  . TONSILLECTOMY  1968  . TOTAL KNEE ARTHROPLASTY Left 05/02/2017  . TOTAL KNEE ARTHROPLASTY Left 05/02/2017   Procedure: LEFT TOTAL KNEE ARTHROPLASTY;  Surgeon: [name], MD;  Location: [facility];  Service: Orthopedics;  Laterality: Left;  . UPPER GASTROINTESTINAL ENDOSCOPY    . VAGINAL HYSTERECTOMY  1980s    There were no vitals filed for this visit.  Subjective Assessment - 06/28/17 1302    Subjective  Pt states she saw Dr. Durward Fortes this morning who is very pleased with her progress. She will f/u with him in 2 months.     Patient Stated Goals  full motion of the knee    Currently in Pain?  No/denies         [facility] PT Assessment - 06/28/17 0001      Circumferential Edema   Circumferential - Left   17.5 inches, joint line was 18" joint line      AROM   Left Knee Extension  0 was 3    Left Knee Flexion  121 was 115  Strength   Right Hip Extension  4+/5 wa 4    Right Hip ABduction  4/5 was 4    Left Hip Extension  4+/5 was 4    Left Hip ABduction  4/5 was 4    Left Knee Flexion  5/5 was 4+      Balance   Balance Assessed  Yes      Static Standing Balance   Static Standing - Balance Support  No upper extremity supported    Static Standing Balance -  Activities   Single Leg Stance - Right Leg;Single Leg Stance - Left Leg    Static Standing - Comment/# of Minutes  R: 8 sec or < L: 8 sec or <         [facility] Adult PT Treatment/Exercise - 06/28/17 0001      Knee/Hip Exercises: Aerobic   Stationary Bike  x3 min w/u, seat 9 (unbilled)          PT Education - 06/28/17 1333    Education provided  Yes    Education Details  discharge plans, gradually return to normal daily routines, HEP    Person(s) Educated  Patient    Methods  Explanation;Demonstration;Handout    Comprehension  Verbalized understanding;Returned demonstration       PT  Short Term Goals - 06/28/17 1303      PT SHORT TERM GOAL #1   Title  Pt will be independent with HEP and perform consistently to maximzie return to PLOF.    Time  3    Period  Weeks    Status  Achieved      PT SHORT TERM GOAL #2   Title  Pt will have decreased L knee edema by 1" or > to maximize ROM and decrease pain.    Baseline  11/28: 17.5" joint line    Time  3    Period  Weeks    Status  Partially Met      PT SHORT TERM GOAL #3   Title  Pt will have improved AROM to at least 0-115 to maximize gait and stair ambulation.    Baseline  11/28: 0-121 AROM    Time  3    Period  Weeks    Status  Achieved      PT SHORT TERM GOAL #4   Title  Pt will be able to perform SLS for 10 sec or > on BLE in order to maximize gait on uneven ground.    Baseline  11/28: BLE 8 sec or <    Time  3    Period  Weeks    Status  Partially Met        PT Long Term Goals - 06/28/17 1304      PT LONG TERM GOAL #1   Title  Pt will have improved MMT of all muscle groups tested to 5/5 in order to maximize gait and balance.     Baseline  11/28: knee MMT 5/5, proximal hip strength 4 to 4+/5    Time  6    Period  Weeks    Status  Partially Met      PT LONG TERM GOAL #2   Title  Pt will have improved 3MWT by 150f or greater with min to no gait deviations in order to maximize community access.     Time  6    Period  Weeks    Status  Achieved      PT LONG TERM GOAL #  3   Title  Pt will be able to ascend/descend a full flight of stairs reciprocally with 1 to no handrail and demonstrate proper mechanics with good eccentric control in order to show improved functional strength and ROM of the L knee so pt can access her upstairs with greater ease.    Baseline  07/13/23: stairs WFL with 1 to no handrail    Time  6    Period  Weeks    Status  Achieved      PT LONG TERM GOAL #4   Title  Pt will report being able to sleep through the night without awakening due to pain in order to maximize overall recovery.     Baseline  07-13-23: knee does not wake her up at night    Time  6    Period  Weeks    Status  Achieved            Plan - 2017/07/12 1334    Clinical Impression Statement  PT reassessed pt early this date since she verbalized last session and today that she was feeling so great. Pt has made great progress towards her goals as illustrated above. Her SLS and hip strength are still slightly deficient, but overall WFL. Pt states that she feels great and has no difficulty with any ADLs or IADLs. PT encouraged pt to gradually return to her regular daily activities. Updated HEP to include hip strengthening and educated pt to continue to challenge balance.     Rehab Potential  Good    PT Frequency  3x / week    PT Duration  6 weeks    PT Treatment/Interventions  Cryotherapy;Electrical Stimulation;Moist Heat;DME Instruction;Gait training;Stair training;Functional mobility training;Therapeutic activities;Therapeutic exercise;Balance training;Neuromuscular re-education;Patient/family education;Manual techniques;Scar mobilization;Passive range of motion;Dry needling;Energy conservation;Taping    PT Next Visit Plan  discharged    PT Home Exercise Plan  eval: quad sets, heel slides, knee drives on step, standing calf stretch on step; add theraband (green) exercises; 11/16: TKE; 2023-07-13: standing hip abd with GTB, sidestepping with GTB, STS, fwd/lat step ups, challenge balance (SLS, tandem stance, on couch cushion, etc.)    Consulted and Agree with Plan of Care  Patient       Patient will benefit from skilled therapeutic intervention in order to improve the following deficits and impairments:  Abnormal gait, Decreased activity tolerance, Decreased balance, Decreased endurance, Decreased mobility, Decreased range of motion, Decreased strength, Difficulty walking, Hypomobility, Increased edema, Increased fascial restricitons, Increased muscle spasms, Impaired flexibility, Improper body mechanics, Pain  Visit  Diagnosis: Acute pain of left knee  Stiffness of left knee, not elsewhere classified  Localized edema  Muscle weakness (generalized)  Lymphedema, not elsewhere classified   G-Codes - Jul 12, 2017 1340    Functional Assessment Tool Used (Outpatient Only)  clinical judgement, 5xSTS, 3MWT, SLS, MMT, ROM, gait    Functional Limitation  Mobility: Walking and moving around    Mobility: Walking and Moving Around Goal Status [phone])  At least 1 percent but less than 20 percent impaired, limited or restricted    Mobility: Walking and Moving Around Discharge Status [phone])  At least 1 percent but less than 20 percent impaired, limited or restricted       Problem List Patient Active Problem List   Diagnosis Date Noted  . Unilateral primary osteoarthritis, left knee 05/02/2017  . S/P total knee replacement using cement, left 05/02/2017  . Primary osteoarthritis of left knee 12/21/2016  . Chondromalacia patellae, left knee 05/26/2016  .  Breast cancer of upper-inner quadrant of right female breast ([place]) 11/04/2015  . GERD (gastroesophageal reflux disease) 09/02/2013  . Obesity 09/02/2013  . Ventral hernia, recurrent 09/02/2013  . Unspecified asthma(493.90) 12/12/2012  . Cough 03/27/2012  . Right middle lobe syndrome 03/27/2012       PHYSICAL THERAPY DISCHARGE SUMMARY  Visits from Start of Care: 13  Current functional level related to goals / functional outcomes: See clinical impression above   Remaining deficits: See clinical impression above   Education / Equipment: Updated HEP, gradually return to normal daily activities Plan: Patient agrees to discharge.  Patient goals were partially met. Patient is being discharged due to being pleased with the current functional level.  ?????       Geraldine Solar PT, [place] [address] [place], [place], [address] Phone: [phone]   Fax:  [phone]  Name: SHAOLIN ARMAS MRN: [id number] Date of Birth: May 01, 1946

## 2017-06-29 DIAGNOSIS — J069 Acute upper respiratory infection, unspecified: Secondary | ICD-10-CM | POA: Diagnosis not present

## 2017-06-29 DIAGNOSIS — E6609 Other obesity due to excess calories: Secondary | ICD-10-CM | POA: Diagnosis not present

## 2017-06-29 DIAGNOSIS — Z1389 Encounter for screening for other disorder: Secondary | ICD-10-CM | POA: Diagnosis not present

## 2017-06-29 DIAGNOSIS — Z6834 Body mass index (BMI) 34.0-34.9, adult: Secondary | ICD-10-CM | POA: Diagnosis not present

## 2017-06-30 ENCOUNTER — Ambulatory Visit (HOSPITAL_COMMUNITY): Payer: Medicare Other | Admitting: Physical Therapy

## 2017-08-10 DIAGNOSIS — R1900 Intra-abdominal and pelvic swelling, mass and lump, unspecified site: Secondary | ICD-10-CM | POA: Diagnosis not present

## 2017-08-10 DIAGNOSIS — Z9889 Other specified postprocedural states: Secondary | ICD-10-CM | POA: Diagnosis not present

## 2017-08-10 DIAGNOSIS — K432 Incisional hernia without obstruction or gangrene: Secondary | ICD-10-CM | POA: Diagnosis not present

## 2017-08-14 DIAGNOSIS — C50211 Malignant neoplasm of upper-inner quadrant of right female breast: Secondary | ICD-10-CM | POA: Diagnosis not present

## 2017-08-14 DIAGNOSIS — I890 Lymphedema, not elsewhere classified: Secondary | ICD-10-CM | POA: Diagnosis not present

## 2017-08-24 DIAGNOSIS — Z9889 Other specified postprocedural states: Secondary | ICD-10-CM | POA: Diagnosis not present

## 2017-08-24 DIAGNOSIS — K432 Incisional hernia without obstruction or gangrene: Secondary | ICD-10-CM | POA: Diagnosis not present

## 2017-08-24 DIAGNOSIS — M6208 Separation of muscle (nontraumatic), other site: Secondary | ICD-10-CM | POA: Diagnosis not present

## 2017-08-25 ENCOUNTER — Other Ambulatory Visit (HOSPITAL_COMMUNITY): Payer: Self-pay | Admitting: Adult Health

## 2017-08-25 DIAGNOSIS — M8580 Other specified disorders of bone density and structure, unspecified site: Secondary | ICD-10-CM

## 2017-08-25 DIAGNOSIS — Z780 Asymptomatic menopausal state: Secondary | ICD-10-CM

## 2017-08-25 DIAGNOSIS — Z79811 Long term (current) use of aromatase inhibitors: Secondary | ICD-10-CM

## 2017-09-20 ENCOUNTER — Ambulatory Visit (INDEPENDENT_AMBULATORY_CARE_PROVIDER_SITE_OTHER): Payer: Medicare Other | Admitting: Orthopedic Surgery

## 2017-09-20 ENCOUNTER — Encounter (INDEPENDENT_AMBULATORY_CARE_PROVIDER_SITE_OTHER): Payer: Self-pay | Admitting: Orthopedic Surgery

## 2017-09-20 VITALS — BP 123/85 | HR 80 | Wt 185.0 lb

## 2017-09-20 DIAGNOSIS — Z96652 Presence of left artificial knee joint: Secondary | ICD-10-CM | POA: Diagnosis not present

## 2017-09-20 NOTE — Progress Notes (Signed)
Office Visit Note   Patient: Caitlin [name]           Date of Birth: 1945/08/30           MRN: [id number] Visit Date: 09/20/2017              Requested by: Sharilyn Sites, [place], [place] [address] PCP: Sharilyn Sites, MD   Assessment & Plan: Visit Diagnoses:  1. Total knee replacement status, left     Plan:  #1: Follow back up in 3 months  Follow-Up Instructions: No Follow-up on file.   Orders:  No orders of the defined types were placed in this encounter.  No orders of the defined types were placed in this encounter.     Procedures: No procedures performed   Clinical Data: No additional findings.   Subjective: Chief Complaint  Patient presents with  . Left Knee - Routine Post Op    Caitlin [name] is a 30 y o S/P  3 1/2 months Left total knee. She has no issues and no pain.     HPI  Is a very pleasant 72 year old white female who is seen today for follow-up of a left total knee arthroplasty now 3-1/2 months status post procedure.  No issues and actually no pain.  Has some difficulty gaining her motion but she felt that the CPM machine was a wonderful device.  She is very happy with the results she states and has no problems with her knee at all.  Review of Systems  Constitutional: Negative for chills, fatigue and fever.  HENT: Negative for hearing loss and tinnitus.   Eyes: Negative for itching.  Respiratory: Negative for chest tightness and shortness of breath.   Cardiovascular: Negative for chest pain, palpitations and leg swelling.  Gastrointestinal: Negative for blood in stool, constipation and diarrhea.  Endocrine: Negative for polyuria.  Genitourinary: Negative for dysuria.  Musculoskeletal: Negative for back pain, joint swelling, neck pain and neck stiffness.  Allergic/Immunologic: Negative for immunocompromised state.  Neurological: Negative for dizziness, numbness and headaches.  Hematological: Does not bruise/bleed easily.    Psychiatric/Behavioral: Negative for sleep disturbance. The patient is not nervous/anxious.      Objective: Vital Signs: BP 123/85   Pulse 80   Wt 185 lb (83.9 kg)   BMI 31.76 kg/m   Physical Exam  Constitutional: She is oriented to person, place, and time. She appears well-developed and well-nourished.  Eyes: Conjunctivae and EOM are normal. Pupils are equal, round, and reactive to light.  Pulmonary/Chest: Effort normal.  Neurological: She is alert and oriented to person, place, and time.  Skin: Skin is warm and dry.  Psychiatric: She has a normal mood and affect. Her behavior is normal. Judgment and thought content normal.  Vitals reviewed.   Ortho Exam  Exam today reveals the wound to be well-healed.  She has range of motion full extension to 105 degrees of flexion.  Excellent stability of the knee.  Calf is supple nontender.  Neurovascular intact distally.  Specialty Comments:  No specialty comments available.  Imaging: No results found.   PMFS History: Current Outpatient Medications  Medication Sig Dispense Refill  . anastrozole (ARIMIDEX) 1 MG tablet Take 1 tablet (1 mg total) by mouth daily. 90 tablet 2  . B Complex Vitamins (B-COMPLEX/B-12 SL) Place 1 tablet under the tongue daily.     . Calcium Citrate-Vitamin D (CALCIUM CITRATE CHEWY BITE) 500-500 MG-UNIT CHEW Chew 500 mg by mouth 2 (two) times daily.     [name]  ciprofloxacin (CIPRO) 500 MG tablet     . diphenhydrAMINE (BENADRYL) 25 mg capsule Take 25 mg by mouth at bedtime as needed for sleep.    [name] docusate sodium (COLACE) 100 MG capsule Take 100 mg by mouth at bedtime.     [name] esomeprazole (NEXIUM) 20 MG capsule Take 40 mg by mouth daily before breakfast.     . famotidine (PEPCID) 20 MG tablet Take 20 mg by mouth at bedtime.     . Fluticasone-Salmeterol (ADVAIR) 250-50 MCG/DOSE AEPB Inhale 1 puff into the lungs 2 (two) times daily as needed (shortness of breath).    . Iron-Vitamin C (IRON 100/C PO) Take 1 tablet by  mouth daily. Celebrate iron + vit C    . Magnesium 250 MG TABS Take 500 mg by mouth every evening.    [name] MELATONIN PO Take 1 tablet by mouth at bedtime as needed (sleep).    . Menaquinone-7 (VITAMIN K2) 100 MCG CAPS Take 100 mcg by mouth every evening.     . Multiple Vitamin (MULTIVITAMIN) capsule Take 1 capsule by mouth 2 (two) times daily with a meal. Celebrate Bariatric MVI    . mupirocin ointment (BACTROBAN) 2 %     . pravastatin (PRAVACHOL) 20 MG tablet Take 20 mg by mouth at bedtime.     . Probiotic Product (PROBIOTIC PO) Take 1 capsule by mouth daily.    [name] telmisartan-hydrochlorothiazide (MICARDIS HCT) 80-25 MG per tablet Take 0.5 tablets by mouth daily.      No current facility-administered medications for this visit.      Patient Active Problem List   Diagnosis Date Noted  . Unilateral primary osteoarthritis, left knee 05/02/2017  . S/P total knee replacement using cement, left 05/02/2017  . Primary osteoarthritis of left knee 12/21/2016  . Chondromalacia patellae, left knee 05/26/2016  . Breast cancer of upper-inner quadrant of right female breast ([place]) 11/04/2015  . GERD (gastroesophageal reflux disease) 09/02/2013  . Obesity 09/02/2013  . Ventral hernia, recurrent 09/02/2013  . Unspecified asthma(493.90) 12/12/2012  . Cough 03/27/2012  . Right middle lobe syndrome 03/27/2012   Past Medical History:  Diagnosis Date  . Arthritis   . Asthma   . Breast cancer ([place])   . Breast disorder    cancer  . Breast mass    benign  . GERD (gastroesophageal reflux disease)   . Heart murmur    AS CHILD  OUTGREW IT  . History of hiatal hernia   . Hyperlipidemia   . Hypertension   . Impaired fasting glucose   . Rectocele   . Varicose vein of leg     Family History  Problem Relation Age of Onset  . Colon cancer Father   . Rectal cancer Father   . Prostate cancer Father   . Dementia Mother   . Osteoporosis Mother   . Healthy Daughter   . Healthy Son   . Pancreatic cancer  Paternal Grandfather   . Heart disease Paternal Grandmother   . Heart disease Maternal Grandfather   . Breast cancer Maternal Grandmother   . Scoliosis Sister     Past Surgical History:  Procedure Laterality Date  . BREAST LUMPECTOMY WITH RADIOACTIVE SEED AND SENTINEL LYMPH NODE BIOPSY Right 11/11/2015   Procedure: RIGHT BREAST RADIOACTIVE SEED GUIDED  LUMPECTOMY AND RADIOACTIVE SEED TARGETED SENTINEL LYMPH NODE BIOPSY;  Surgeon: Stark Klein, MD;  Location: [facility];  Service: General;  Laterality: Right;  . BREAST SURGERY Left    breast biopies  x4  . CHOLECYSTECTOMY  09/08/2010  . COLONOSCOPY    . COLONOSCOPY N/A 09/25/2013   Procedure: COLONOSCOPY;  Surgeon: Rogene Houston, MD;  [facility];  Service: Endoscopy;  Laterality: N/A;  100-moved to 1200 Ann to notify pt  . HERNIA REPAIR  12/24/2010, 12-2014   TOTAL 4  . LAPAROSCOPIC GASTRIC SLEEVE RESECTION  11-2013   at [place]  2007/2008  . TONSILLECTOMY  1968  . TOTAL KNEE ARTHROPLASTY Left 05/02/2017  . TOTAL KNEE ARTHROPLASTY Left 05/02/2017   Procedure: LEFT TOTAL KNEE ARTHROPLASTY;  Surgeon: [name], MD;  Location: [facility];  Service: Orthopedics;  Laterality: Left;  . UPPER GASTROINTESTINAL ENDOSCOPY    . VAGINAL HYSTERECTOMY  1980s   Social History   Occupational History  . Occupation: retired    Fish farm manager: RETIRED    Comment: teacher  Tobacco Use  . Smoking status: Never Smoker  . Smokeless tobacco: Never Used  Substance and Sexual Activity  . Alcohol use: No  . Drug use: No  . Sexual activity: Yes    Birth control/protection: Surgical    Comment: hyst

## 2017-10-04 ENCOUNTER — Other Ambulatory Visit (HOSPITAL_COMMUNITY): Payer: Medicare Other

## 2017-10-04 DIAGNOSIS — I10 Essential (primary) hypertension: Secondary | ICD-10-CM | POA: Diagnosis not present

## 2017-10-04 DIAGNOSIS — Z853 Personal history of malignant neoplasm of breast: Secondary | ICD-10-CM | POA: Diagnosis not present

## 2017-10-04 DIAGNOSIS — Z887 Allergy status to serum and vaccine status: Secondary | ICD-10-CM | POA: Diagnosis not present

## 2017-10-04 DIAGNOSIS — K432 Incisional hernia without obstruction or gangrene: Secondary | ICD-10-CM | POA: Diagnosis not present

## 2017-10-04 DIAGNOSIS — K219 Gastro-esophageal reflux disease without esophagitis: Secondary | ICD-10-CM | POA: Diagnosis not present

## 2017-10-04 DIAGNOSIS — J45909 Unspecified asthma, uncomplicated: Secondary | ICD-10-CM | POA: Diagnosis not present

## 2017-10-04 DIAGNOSIS — K430 Incisional hernia with obstruction, without gangrene: Secondary | ICD-10-CM | POA: Diagnosis not present

## 2017-10-04 DIAGNOSIS — Z79899 Other long term (current) drug therapy: Secondary | ICD-10-CM | POA: Diagnosis not present

## 2017-10-05 DIAGNOSIS — K219 Gastro-esophageal reflux disease without esophagitis: Secondary | ICD-10-CM | POA: Diagnosis not present

## 2017-10-05 DIAGNOSIS — K432 Incisional hernia without obstruction or gangrene: Secondary | ICD-10-CM | POA: Diagnosis not present

## 2017-10-05 DIAGNOSIS — Z853 Personal history of malignant neoplasm of breast: Secondary | ICD-10-CM | POA: Diagnosis not present

## 2017-10-05 DIAGNOSIS — Z887 Allergy status to serum and vaccine status: Secondary | ICD-10-CM | POA: Diagnosis not present

## 2017-10-05 DIAGNOSIS — J45909 Unspecified asthma, uncomplicated: Secondary | ICD-10-CM | POA: Diagnosis not present

## 2017-10-05 DIAGNOSIS — I10 Essential (primary) hypertension: Secondary | ICD-10-CM | POA: Diagnosis not present

## 2017-10-10 DIAGNOSIS — R600 Localized edema: Secondary | ICD-10-CM | POA: Diagnosis not present

## 2017-10-10 DIAGNOSIS — R05 Cough: Secondary | ICD-10-CM | POA: Diagnosis not present

## 2017-10-10 DIAGNOSIS — E6609 Other obesity due to excess calories: Secondary | ICD-10-CM | POA: Diagnosis not present

## 2017-10-10 DIAGNOSIS — Z6836 Body mass index (BMI) 36.0-36.9, adult: Secondary | ICD-10-CM | POA: Diagnosis not present

## 2017-10-11 ENCOUNTER — Inpatient Hospital Stay (HOSPITAL_COMMUNITY): Payer: Medicare Other | Admitting: Internal Medicine

## 2017-10-23 ENCOUNTER — Ambulatory Visit (HOSPITAL_COMMUNITY)
Admission: RE | Admit: 2017-10-23 | Discharge: 2017-10-23 | Disposition: A | Discharge status: Home / Self Care | Payer: Medicare Other | Source: Ambulatory Visit | Attending: Oncology | Admitting: Oncology

## 2017-10-23 DIAGNOSIS — M8580 Other specified disorders of bone density and structure, unspecified site: Secondary | ICD-10-CM | POA: Insufficient documentation

## 2017-10-23 DIAGNOSIS — Z780 Asymptomatic menopausal state: Secondary | ICD-10-CM | POA: Diagnosis not present

## 2017-10-23 DIAGNOSIS — C50211 Malignant neoplasm of upper-inner quadrant of right female breast: Secondary | ICD-10-CM | POA: Diagnosis not present

## 2017-10-23 DIAGNOSIS — Z13820 Encounter for screening for osteoporosis: Secondary | ICD-10-CM | POA: Insufficient documentation

## 2017-10-23 DIAGNOSIS — Z170 Estrogen receptor positive status [ER+]: Secondary | ICD-10-CM | POA: Insufficient documentation

## 2017-11-08 ENCOUNTER — Other Ambulatory Visit (HOSPITAL_COMMUNITY): Payer: Self-pay | Admitting: Hematology

## 2017-11-08 DIAGNOSIS — Z853 Personal history of malignant neoplasm of breast: Secondary | ICD-10-CM

## 2017-11-16 DIAGNOSIS — Z8719 Personal history of other diseases of the digestive system: Secondary | ICD-10-CM | POA: Diagnosis not present

## 2017-11-16 DIAGNOSIS — Z9889 Other specified postprocedural states: Secondary | ICD-10-CM | POA: Diagnosis not present

## 2017-11-21 ENCOUNTER — Ambulatory Visit (HOSPITAL_COMMUNITY)
Admission: RE | Admit: 2017-11-21 | Discharge: 2017-11-21 | Disposition: A | Discharge status: Home / Self Care | Payer: Medicare Other | Source: Ambulatory Visit | Attending: Internal Medicine | Admitting: Internal Medicine

## 2017-11-21 DIAGNOSIS — Z853 Personal history of malignant neoplasm of breast: Secondary | ICD-10-CM | POA: Insufficient documentation

## 2017-11-21 DIAGNOSIS — R922 Inconclusive mammogram: Secondary | ICD-10-CM | POA: Diagnosis not present

## 2017-11-21 DIAGNOSIS — C50211 Malignant neoplasm of upper-inner quadrant of right female breast: Secondary | ICD-10-CM

## 2017-11-21 DIAGNOSIS — Z170 Estrogen receptor positive status [ER+]: Secondary | ICD-10-CM

## 2017-11-22 ENCOUNTER — Encounter (HOSPITAL_COMMUNITY): Payer: Self-pay | Admitting: Internal Medicine

## 2017-11-22 ENCOUNTER — Inpatient Hospital Stay (HOSPITAL_COMMUNITY): Payer: Medicare Other | Attending: Internal Medicine | Admitting: Internal Medicine

## 2017-11-22 VITALS — BP 136/66 | HR 64 | Temp 97.4°F | Resp 18 | Wt 202.0 lb

## 2017-11-22 DIAGNOSIS — Z79899 Other long term (current) drug therapy: Secondary | ICD-10-CM | POA: Insufficient documentation

## 2017-11-22 DIAGNOSIS — I10 Essential (primary) hypertension: Secondary | ICD-10-CM | POA: Insufficient documentation

## 2017-11-22 DIAGNOSIS — M8580 Other specified disorders of bone density and structure, unspecified site: Secondary | ICD-10-CM | POA: Diagnosis not present

## 2017-11-22 DIAGNOSIS — C50211 Malignant neoplasm of upper-inner quadrant of right female breast: Secondary | ICD-10-CM | POA: Insufficient documentation

## 2017-11-22 DIAGNOSIS — Z170 Estrogen receptor positive status [ER+]: Secondary | ICD-10-CM

## 2017-11-22 NOTE — Patient Instructions (Signed)
[facility]  Discharge Instructions:  You were seen by Dr. Higgs today _______________________________________________________________  Thank you for choosing [facility] at [facility] to provide your oncology and hematology care.  To afford each patient quality time with our providers, please arrive at least 15 minutes before your scheduled appointment.  You need to re-schedule your appointment if you arrive 10 or more minutes late.  We strive to give you quality time with our providers, and arriving late affects you and other patients whose appointments are after yours.  Also, if you no show three or more times for appointments you may be dismissed from the clinic.  Again, thank you for choosing [facility] at [facility]. Our hope is that these requests will allow you access to exceptional care and in a timely manner. _______________________________________________________________  If you have questions after your visit, please contact our office at [phone] between the hours of 8:30 a.m. and 5:00 p.m. Voicemails left after 4:30 p.m. will not be returned until the following business day. _______________________________________________________________  For prescription refill requests, have your pharmacy contact our office. _______________________________________________________________  Recommendations made by the consultant and any test results will be sent to your referring physician. _______________________________________________________________ 

## 2017-11-22 NOTE — Progress Notes (Signed)
Diagnosis Malignant neoplasm of upper-inner quadrant of right breast in female, estrogen receptor positive ([place]) - Plan: CBC with Differential/Platelet, Comprehensive metabolic panel, Lactate dehydrogenase  Staging Cancer Staging Breast cancer of upper-inner quadrant of right female breast [facility]) Staging form: Breast, AJCC 7th Edition - Pathologic stage from 11/11/2015: Stage IA (T1c, N0, cM0) - Signed by Holley Bouche, NP on 04/18/2016   Assessment and Plan:  1.  Stage 1 A pT1cN0M0 ER+ PR+ HER 2 neu - carcinoma of the R breast, upper inner quadrant, ER+ 100% PR + 100% HER 2 -.  Pt is s/p Lumpectomy/sentinel node biopsy with Dr. Barry Dienes on 11/11/2015.  She was treated with adjuvant radiation.  She had Oncotype evaluation which placed her in a low risk category.  Patient had bilateral diagnostic mammogram done 11/21/2017 that was negative with repeat imaging recommended in April 2020.  She remains on Arimidex.  She is recommended for 5 years of therapy which may extend to 10 years.  She will return to clinic for follow-up in October 2019.  2.  Osteopenia.  This was noted on bone density done March 2019.  She should continue calcium and vitamin D as recommended.  Will repeat bone density in 12 months.  If ongoing changes in bone density patient will be given options of Prolia therapy.  3.  Hypertension.  Blood pressure is 136/66.  Follow-up with PCP.  Current Status: Patient seen today for follow-up.  She reports she is doing well and is tolerating Arimidex.  She is here today to go over mammogram results.    Breast cancer of upper-inner quadrant of right female breast ([place])   09/24/2014 Imaging    DEXA with normal bone density      10/06/2015 Imaging    Screening bilateral mammogram BI-RADS Category 0, R breast possible mass, L breast possible mass      10/20/2015 Imaging    Diagnostic B mammogram, R breast Ultrasound with suspicious mass in the R breast at the 1 o clock location 5 cm  from the nipple, LN in the low R axillae with cortex nodularity      10/27/2015 Initial Biopsy    Ultrasound guided biopsy of R axillary LN, BENIGN. Ultrasound guided biopsy of R breast with invasive ductal carcinoma      11/04/2015 Initial Diagnosis    Breast cancer of upper-inner quadrant of right female breast ([place])      11/09/2015 Procedure    Radioactive seed localization R breast      11/11/2015 Surgery    R breast radioactive seed localized lumpectomy and seed targeted sentinel LN biopsy      11/11/2015 Pathology Results    Invasive ductal carcinoma Grade I/III spanning 1.1 cm, lobular neoplasia (LCIS) resection margins negative. 3 negative sentinel nodes ER (100%), PR (100%), Her2 neu negative pT1c, pN0      12/11/2015 Oncotype testing    Recurrence Score result 11, 10 year risk of distant recurrence with tamoxifen alone 8% (low-risk)      01/11/2016 - 02/04/2016 Radiation Therapy    Adjuvant breast radiation ([place]): 42.72 Gy in 16 fractions to the right breast      01/2016 -  Anti-estrogen oral therapy    Anastrazole 1 mg daily. Planned duration of therapy: 5 years.         Problem List Patient Active Problem List   Diagnosis Date Noted  . Unilateral primary osteoarthritis, left knee [M17.12] 05/02/2017  . S/P total knee replacement using cement, left [Z96.652]  05/02/2017  . Primary osteoarthritis of left knee [M17.12] 12/21/2016  . Chondromalacia patellae, left knee [M22.42] 05/26/2016  . Breast cancer of upper-inner quadrant of right female breast ([place]) [C50.211] 11/04/2015  . GERD (gastroesophageal reflux disease) [K21.9] 09/02/2013  . Obesity [E66.9] 09/02/2013  . Ventral hernia, recurrent [K43.2] 09/02/2013  . Unspecified asthma(493.90) [F81.017] 12/12/2012  . Cough [R05] 03/27/2012  . 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 Arthritis   . Asthma   . Breast cancer ([place])    . Breast disorder    cancer  . Breast mass    benign  . GERD (gastroesophageal reflux disease)   . Heart murmur    AS CHILD  OUTGREW IT  . History of hiatal hernia   . Hyperlipidemia   . Hypertension   . Impaired fasting glucose   . Rectocele   . Varicose vein of leg     Past Surgical History Past Surgical History:  Procedure Laterality Date  . BREAST LUMPECTOMY WITH RADIOACTIVE SEED AND SENTINEL LYMPH NODE BIOPSY Right 11/11/2015   Procedure: RIGHT BREAST RADIOACTIVE SEED GUIDED  LUMPECTOMY AND RADIOACTIVE SEED TARGETED SENTINEL LYMPH NODE BIOPSY;  Surgeon: Stark Klein, MD;  Location: [facility];  Service: General;  Laterality: Right;  . BREAST SURGERY Left    breast biopies x4  . CHOLECYSTECTOMY  09/08/2010  . COLONOSCOPY    . COLONOSCOPY N/A 09/25/2013   Procedure: COLONOSCOPY;  Surgeon: Rogene Houston, MD;  [facility];  Service: Endoscopy;  Laterality: N/A;  100-moved to 1200 Ann to notify pt  . HERNIA REPAIR  12/24/2010, 12-2014   TOTAL 4  . LAPAROSCOPIC GASTRIC SLEEVE RESECTION  11-2013   at [place]  2007/2008  . TONSILLECTOMY  1968  . TOTAL KNEE ARTHROPLASTY Left 05/02/2017  . TOTAL KNEE ARTHROPLASTY Left 05/02/2017   Procedure: LEFT TOTAL KNEE ARTHROPLASTY;  Surgeon: [name], MD;  Location: [facility];  Service: Orthopedics;  Laterality: Left;  . UPPER GASTROINTESTINAL ENDOSCOPY    . VAGINAL HYSTERECTOMY  1980s    Family History Family History  Problem Relation Age of Onset  . Colon cancer Father   . Rectal cancer Father   . Prostate cancer Father   . Dementia Mother   . Osteoporosis Mother   . Healthy Daughter   . Healthy Son   . Pancreatic cancer Paternal Grandfather   . Heart disease Paternal Grandmother   . Heart disease Maternal Grandfather   . Breast cancer Maternal Grandmother   . Scoliosis Sister      Social History  reports that she has never smoked. She has never used  smokeless tobacco. She reports that she does not drink alcohol or use drugs.  Medications  Current Outpatient Medications:  .  acetaminophen (TYLENOL) 325 MG tablet, Take by mouth., Disp: , Rfl:  .  anastrozole (ARIMIDEX) 1 MG tablet, Take 1 tablet (1 mg total) by mouth daily., Disp: 90 tablet, Rfl: 2 .  azithromycin (ZITHROMAX) 250 MG tablet, , Disp: , Rfl:  .  B Complex Vitamins (B-COMPLEX/B-12 SL), Place 1 tablet under the tongue daily. , Disp: , Rfl:  .  Calcium Citrate 200 MG TABS, Take by mouth., Disp: , Rfl:  .  Calcium Citrate-Vitamin D (CALCIUM CITRATE CHEWY BITE) 500-500 MG-UNIT CHEW, Chew 500 mg by mouth 2 (two) times daily. , Disp: , Rfl:  .  ciprofloxacin (  CIPRO) 500 MG tablet, , Disp: , Rfl:  .  cyclobenzaprine (FLEXERIL) 5 MG tablet, TAKE 1 TABLET BY MOUTH 3 TIMES A DAY AS NEEDED FOR MUSCLE SPASMS, Disp: , Rfl: 0 .  diphenhydrAMINE (BENADRYL) 25 mg capsule, Take 25 mg by mouth at bedtime as needed for sleep., Disp: , Rfl:  .  docusate sodium (COLACE) 100 MG capsule, Take 100 mg by mouth at bedtime. , Disp: , Rfl:  .  esomeprazole (NEXIUM) 20 MG capsule, Take 40 mg by mouth daily before breakfast. , Disp: , Rfl:  .  famotidine (PEPCID) 20 MG tablet, Take 20 mg by mouth at bedtime. , Disp: , Rfl:  .  Fluticasone-Salmeterol (ADVAIR) 250-50 MCG/DOSE AEPB, Inhale 1 puff into the lungs 2 (two) times daily as needed (shortness of breath)., Disp: , Rfl:  .  Iron-Vitamin C (IRON 100/C PO), Take 1 tablet by mouth daily. Celebrate iron + vit C, Disp: , Rfl:  .  Magnesium 250 MG TABS, Take 500 mg by mouth every evening., Disp: , Rfl:  .  MELATONIN PO, Take 1 tablet by mouth at bedtime as needed (sleep)., Disp: , Rfl:  .  Menaquinone-7 (VITAMIN K2) 100 MCG CAPS, Take 100 mcg by mouth every evening. , Disp: , Rfl:  .  Multiple Vitamin (MULTIVITAMIN) capsule, Take 1 capsule by mouth 2 (two) times daily with a meal. Celebrate Bariatric MVI, Disp: , Rfl:  .  mupirocin ointment (BACTROBAN) 2 %, ,  Disp: , Rfl:  .  pravastatin (PRAVACHOL) 20 MG tablet, Take 20 mg by mouth at bedtime. , Disp: , Rfl:  .  predniSONE (DELTASONE) 10 MG tablet, , Disp: , Rfl:  .  Probiotic Product (PROBIOTIC PO), Take 1 capsule by mouth daily., Disp: , Rfl:  .  telmisartan-hydrochlorothiazide (MICARDIS HCT) 80-25 MG per tablet, Take 0.5 tablets by mouth daily. , Disp: , Rfl:   Allergies Tetanus toxoid adsorbed  Review of Systems Review of Systems - Oncology ROS as per HPI otherwise 12 point ROS is negative.   Physical Exam  Vitals Wt Readings from Last 3 Encounters:  11/22/17 202 lb (91.6 kg)  09/20/17 185 lb (83.9 kg)  06/28/17 198 lb (89.8 kg)   Temp Readings from Last 3 Encounters:  11/22/17 (!) 97.4 F (36.3 C) (Oral)  05/04/17 98.5 F (36.9 C) (Oral)  04/25/17 97.8 F (36.6 C)   BP Readings from Last 3 Encounters:  11/22/17 136/66  09/20/17 123/85  06/28/17 112/65   Pulse Readings from Last 3 Encounters:  11/22/17 64  09/20/17 80  06/28/17 95   Constitutional: Well-developed, well-nourished, and in no distress.   HENT: Head: Normocephalic and atraumatic.  Mouth/Throat: No oropharyngeal exudate. Mucosa moist. Eyes: Pupils are equal, round, and reactive to light. Conjunctivae are normal. No scleral icterus.  Neck: Normal range of motion. Neck supple. No JVD present.  Cardiovascular: Normal rate, regular rhythm and normal heart sounds.  Exam reveals no gallop and no friction rub.   No murmur heard. Pulmonary/Chest: Effort normal and breath sounds normal. No respiratory distress. No wheezes.No rales.  Abdominal: Soft. Bowel sounds are normal. No distension. There is no tenderness. There is no guarding.  Musculoskeletal: No edema or tenderness.  Lymphadenopathy: No cervical, axillary or supraclavicular adenopathy.  Neurological: Alert and oriented to person, place, and time. No cranial nerve deficit.  Skin: Skin is warm and dry. No rash noted. No erythema. No pallor.  Psychiatric:  Affect and judgment normal.  Bilateral breast exam: Chaperone present.  Lumpectomy  changes and scarring noted on the right.  No palpable dominant masses noted bilaterally.  Labs No visits with results within 3 Day(s) from this visit.  Latest known visit with results is:  Admission on 05/02/2017, Discharged on 05/04/2017  Component Date Value Ref Range Status  . WBC 05/03/2017 6.0  4.0 - 10.5 K/uL Final  . RBC 05/03/2017 3.04* 3.87 - 5.11 MIL/uL Final  . Hemoglobin 05/03/2017 10.1* 12.0 - 15.0 g/dL Final  . HCT 05/03/2017 29.6* 36.0 - 46.0 % Final  . MCV 05/03/2017 97.4  78.0 - 100.0 fL Final  . MCH 05/03/2017 33.2  26.0 - 34.0 pg Final  . MCHC 05/03/2017 34.1  30.0 - 36.0 g/dL Final  . RDW 05/03/2017 12.1  11.5 - 15.5 % Final  . Platelets 05/03/2017 203  150 - 400 K/uL Final  . Sodium 05/03/2017 127* 135 - 145 mmol/L Final  . Potassium 05/03/2017 4.1  3.5 - 5.1 mmol/L Final  . Chloride 05/03/2017 95* 101 - 111 mmol/L Final  . CO2 05/03/2017 23  22 - 32 mmol/L Final  . Glucose, Bld 05/03/2017 97  65 - 99 mg/dL Final  . BUN 05/03/2017 12  6 - 20 mg/dL Final  . Creatinine, Ser 05/03/2017 0.64  0.44 - 1.00 mg/dL Final  . Calcium 05/03/2017 8.7* 8.9 - 10.3 mg/dL Final  . GFR calc non Af Amer 05/03/2017 >60  >60 mL/min Final  . GFR calc Af Amer 05/03/2017 >60  >60 mL/min Final   Comment: (NOTE) The eGFR has been calculated using the CKD EPI equation. This calculation has not been validated in all clinical situations. eGFR's persistently <60 mL/min signify possible Chronic Kidney Disease.   . Anion gap 05/03/2017 9  5 - 15 Final  . WBC 05/04/2017 6.4  4.0 - 10.5 K/uL Final  . RBC 05/04/2017 2.72* 3.87 - 5.11 MIL/uL Final  . Hemoglobin 05/04/2017 9.0* 12.0 - 15.0 g/dL Final  . HCT 05/04/2017 26.1* 36.0 - 46.0 % Final  . MCV 05/04/2017 96.0  78.0 - 100.0 fL Final  . MCH 05/04/2017 33.1  26.0 - 34.0 pg Final  . MCHC 05/04/2017 34.5  30.0 - 36.0 g/dL Final  . RDW 05/04/2017 12.1  11.5 -  15.5 % Final  . Platelets 05/04/2017 220  150 - 400 K/uL Final  . Sodium 05/04/2017 127* 135 - 145 mmol/L Final  . Potassium 05/04/2017 4.0  3.5 - 5.1 mmol/L Final  . Chloride 05/04/2017 93* 101 - 111 mmol/L Final  . CO2 05/04/2017 25  22 - 32 mmol/L Final  . Glucose, Bld 05/04/2017 110* 65 - 99 mg/dL Final  . BUN 05/04/2017 7  6 - 20 mg/dL Final  . Creatinine, Ser 05/04/2017 0.57  0.44 - 1.00 mg/dL Final  . Calcium 05/04/2017 8.9  8.9 - 10.3 mg/dL Final  . GFR calc non Af Amer 05/04/2017 >60  >60 mL/min Final  . GFR calc Af Amer 05/04/2017 >60  >60 mL/min Final   Comment: (NOTE) The eGFR has been calculated using the CKD EPI equation. This calculation has not been validated in all clinical situations. eGFR's persistently <60 mL/min signify possible Chronic Kidney Disease.   . Anion gap 05/04/2017 9  5 - 15 Final     Pathology Orders Placed This Encounter  Procedures  . CBC with Differential/Platelet    Standing Status:   Future    Standing Expiration Date:   11/23/2018  . Comprehensive metabolic panel    Standing Status:   Future  Standing Expiration Date:   11/23/2018  . Lactate dehydrogenase    Standing Status:   Future    Standing Expiration Date:   11/23/2018       Zoila Shutter MD

## 2017-11-24 ENCOUNTER — Ambulatory Visit (HOSPITAL_COMMUNITY): Payer: Medicare Other | Admitting: Internal Medicine

## 2018-01-02 DIAGNOSIS — E6609 Other obesity due to excess calories: Secondary | ICD-10-CM | POA: Diagnosis not present

## 2018-01-02 DIAGNOSIS — Z6835 Body mass index (BMI) 35.0-35.9, adult: Secondary | ICD-10-CM | POA: Diagnosis not present

## 2018-01-02 DIAGNOSIS — N342 Other urethritis: Secondary | ICD-10-CM | POA: Diagnosis not present

## 2018-01-02 DIAGNOSIS — R350 Frequency of micturition: Secondary | ICD-10-CM | POA: Diagnosis not present

## 2018-01-03 ENCOUNTER — Other Ambulatory Visit (INDEPENDENT_AMBULATORY_CARE_PROVIDER_SITE_OTHER): Payer: Self-pay | Admitting: Radiology

## 2018-01-03 MED ORDER — AMOXICILLIN 500 MG PO CAPS: ORAL_CAPSULE | ORAL | 0 refills | Status: DC

## 2018-02-07 ENCOUNTER — Other Ambulatory Visit: Payer: Self-pay | Admitting: *Deleted

## 2018-02-07 ENCOUNTER — Ambulatory Visit (INDEPENDENT_AMBULATORY_CARE_PROVIDER_SITE_OTHER): Payer: Medicare Other | Admitting: *Deleted

## 2018-02-07 ENCOUNTER — Other Ambulatory Visit: Payer: Self-pay

## 2018-02-07 DIAGNOSIS — I868 Varicose veins of other specified sites: Secondary | ICD-10-CM

## 2018-02-07 DIAGNOSIS — I83893 Varicose veins of bilateral lower extremities with other complications: Secondary | ICD-10-CM

## 2018-02-07 NOTE — Progress Notes (Signed)
                   This patient called in yesterday concerned because she has had 4 bleeds since February. Two occurred on the left ankle/foot and two on the right. She had laser ablations in 2007 and 2008 with Dr. Donnetta Hutching. She presents today with numerous blue spiders around her ankles and also very large varicose veins. She has skin changes and swelling bilateral. Bulges are in both calves and thighs. I suggested we not do sclero today and have her see Dr. Scot Dock along with a bilat reflux study in the near future. Patient agreed and those appts were made. She has been wearing her 20-30 mm Hg thigh high compression stockings since the bleeds started in Feb. She is aware elevation and Ibuprofen will help with selling and pain. Follow prn.

## 2018-02-08 ENCOUNTER — Ambulatory Visit (HOSPITAL_COMMUNITY)
Admission: RE | Admit: 2018-02-08 | Discharge: 2018-02-08 | Disposition: A | Discharge status: Home / Self Care | Payer: Medicare Other | Source: Ambulatory Visit | Attending: Family | Admitting: Family

## 2018-02-08 DIAGNOSIS — I83893 Varicose veins of bilateral lower extremities with other complications: Secondary | ICD-10-CM

## 2018-02-08 DIAGNOSIS — I872 Venous insufficiency (chronic) (peripheral): Secondary | ICD-10-CM | POA: Diagnosis not present

## 2018-02-12 ENCOUNTER — Encounter: Payer: Self-pay | Admitting: Vascular Surgery

## 2018-02-12 ENCOUNTER — Ambulatory Visit (INDEPENDENT_AMBULATORY_CARE_PROVIDER_SITE_OTHER): Payer: Medicare Other | Admitting: Vascular Surgery

## 2018-02-12 VITALS — BP 144/71 | HR 75 | Temp 97.6°F | Resp 16 | Ht 63.0 in | Wt 205.0 lb

## 2018-02-12 DIAGNOSIS — I872 Venous insufficiency (chronic) (peripheral): Secondary | ICD-10-CM

## 2018-02-12 DIAGNOSIS — I83813 Varicose veins of bilateral lower extremities with pain: Secondary | ICD-10-CM | POA: Diagnosis not present

## 2018-02-12 NOTE — Progress Notes (Signed)
Patient name: Caitlin [name] MRN: [id number] DOB: Oct 05, 1945 Sex: female   REASON FOR CONSULT:    Bilateral painful varicose veins.  The consult is requested by Dr. Sharilyn Sites  HPI:   Caitlin [name] is a pleasant 72 y.o. female, referred with bilateral varicose veins.  The patient has a long history of varicose veins of both lower extremities.  She is undergone endovenous laser ablation of both great saphenous veins in 2007 and in 2008.  She has developed some varicose veins of both lower extremities.  She has some mild pain at times but the main issue she is had several bleeding episodes from the varicosities down by her ankles.  She also describes some swelling more so on the left side.  Her symptoms are aggravated by standing and sitting and relieved with elevation.  She has worn thigh-high compression stockings with a gradient of 20 to 30 mmHg.  She also elevates her legs.  Her symptoms have been gradually progressive.  Past Medical History:  Diagnosis Date  . Arthritis   . Asthma   . Breast cancer ([place])   . Breast disorder    cancer  . Breast mass    benign  . GERD (gastroesophageal reflux disease)   . Heart murmur    AS CHILD  OUTGREW IT  . History of hiatal hernia   . Hyperlipidemia   . Hypertension   . Impaired fasting glucose   . Rectocele   . Varicose vein of leg     Family History  Problem Relation Age of Onset  . Colon cancer Father   . Rectal cancer Father   . Prostate cancer Father   . Dementia Mother   . Osteoporosis Mother   . Healthy Daughter   . Healthy Son   . Pancreatic cancer Paternal Grandfather   . Heart disease Paternal Grandmother   . Heart disease Maternal Grandfather   . Breast cancer Maternal Grandmother   . Scoliosis Sister     SOCIAL HISTORY: Social History   Socioeconomic History  . Marital status: Married    Spouse name: Not on file  . Number of children: 2  . Years of education: Not on file  . Highest education level: Not  on file  Occupational History  . Occupation: retired    Fish farm manager: RETIRED    Comment: teacher  Social Needs  . Financial resource strain: Not on file  . Food insecurity:    Worry: Not on file    Inability: Not on file  . Transportation needs:    Medical: Not on file    Non-medical: Not on file  Tobacco Use  . Smoking status: Never Smoker  . Smokeless tobacco: Never Used  Substance and Sexual Activity  . Alcohol use: No  . Drug use: No  . Sexual activity: Yes    Birth control/protection: Surgical    Comment: hyst  Lifestyle  . Physical activity:    Days per week: Not on file    Minutes per session: Not on file  . Stress: Not on file  Relationships  . Social connections:    Talks on phone: Not on file    Gets together: Not on file    Attends religious service: Not on file    Active member of club or organization: Not on file    Attends meetings of clubs or organizations: Not on file    Relationship status: Not on file  . Intimate partner violence:  Fear of current or ex partner: Not on file    Emotionally abused: Not on file    Physically abused: Not on file    Forced sexual activity: Not on file  Other Topics Concern  . Not on file  Social History Narrative  . Not on file    Allergies  Allergen Reactions  . Tetanus Toxoid Adsorbed Palpitations    As a child    Current Outpatient Medications  Medication Sig Dispense Refill  . anastrozole (ARIMIDEX) 1 MG tablet Take 1 tablet (1 mg total) by mouth daily. 90 tablet 2  . B Complex Vitamins (B-COMPLEX/B-12 SL) Place 1 tablet under the tongue daily.     . Calcium Citrate 200 MG TABS Take by mouth.    . Calcium Citrate-Vitamin D (CALCIUM CITRATE CHEWY BITE) 500-500 MG-UNIT CHEW Chew 500 mg by mouth 2 (two) times daily.     . diphenhydrAMINE (BENADRYL) 25 mg capsule Take 25 mg by mouth at bedtime as needed for sleep.    [name] docusate sodium (COLACE) 100 MG capsule Take 100 mg by mouth at bedtime.     [name] esomeprazole  (NEXIUM) 20 MG capsule Take 40 mg by mouth daily before breakfast.     . famotidine (PEPCID) 20 MG tablet Take 20 mg by mouth at bedtime.     . Fluticasone-Salmeterol (ADVAIR) 250-50 MCG/DOSE AEPB Inhale 1 puff into the lungs 2 (two) times daily as needed (shortness of breath).    . Iron-Vitamin C (IRON 100/C PO) Take 1 tablet by mouth daily. Celebrate iron + vit C    . Magnesium 250 MG TABS Take 500 mg by mouth every evening.    [name] MELATONIN PO Take 1 tablet by mouth at bedtime as needed (sleep).    . Menaquinone-7 (VITAMIN K2) 100 MCG CAPS Take 100 mcg by mouth every evening.     . Multiple Vitamin (MULTIVITAMIN) capsule Take 1 capsule by mouth 2 (two) times daily with a meal. Celebrate Bariatric MVI    . pravastatin (PRAVACHOL) 20 MG tablet Take 20 mg by mouth at bedtime.     [name] telmisartan-hydrochlorothiazide (MICARDIS HCT) 80-25 MG per tablet Take 0.5 tablets by mouth daily.     [name] acetaminophen (TYLENOL) 325 MG tablet Take by mouth.    [name] amoxicillin (AMOXIL) 500 MG capsule TAKE 4 TABS 1 HOUR BEFORE WORK (Patient not taking: Reported on 02/12/2018) 12 capsule 0  . azithromycin (ZITHROMAX) 250 MG tablet     . ciprofloxacin (CIPRO) 500 MG tablet     . cyclobenzaprine (FLEXERIL) 5 MG tablet TAKE 1 TABLET BY MOUTH 3 TIMES A DAY AS NEEDED FOR MUSCLE SPASMS  0  . mupirocin ointment (BACTROBAN) 2 %     . predniSONE (DELTASONE) 10 MG tablet     . Probiotic Product (PROBIOTIC PO) Take 1 capsule by mouth daily.     No current facility-administered medications for this visit.     REVIEW OF SYSTEMS:  [X]  denotes positive finding, [ ]  denotes negative finding Cardiac  Comments:  Chest pain or chest pressure:    Shortness of breath upon exertion:    Short of breath when lying flat:    Irregular heart rhythm:        Vascular    Pain in calf, thigh, or hip brought on by ambulation:    Pain in feet at night that wakes you up from your sleep:     Blood clot in your veins:    Leg swelling:  x         Pulmonary    Oxygen at home:    Productive cough:     Wheezing:         Neurologic    Sudden weakness in arms or legs:     Sudden numbness in arms or legs:     Sudden onset of difficulty speaking or slurred speech:    Temporary loss of vision in one eye:     Problems with dizziness:         Gastrointestinal    Blood in stool:     Vomited blood:         Genitourinary    Burning when urinating:     Blood in urine:        Psychiatric    Major depression:         Hematologic    Bleeding problems:    Problems with blood clotting too easily:        Skin    Rashes or ulcers:        Constitutional    Fever or chills:     PHYSICAL EXAM:   Vitals:   02/12/18 0954  BP: (!) 144/71  Pulse: 75  Resp: 16  Temp: 97.6 F (36.4 C)  SpO2: 98%  Weight: 205 lb (93 kg)  Height: 5\' 3"  (1.6 m)   GENERAL: The patient is a well-nourished female, in no acute distress. The vital signs are documented above. CARDIAC: There is a regular rate and rhythm.  VASCULAR:  ARTERIAL: She has palpable posterior tibial pulses. VENOUS: She has some large varicose veins on the medial aspect of her right thigh and right calf and also around the ankle.  On the left side she has varicose veins on the medial calf.  On the posterior calf she has spider veins bilaterally.  She does have some hyperpigmentation bilaterally. Currently she has no significant lower extremity swelling. PULMONARY: There is good air exchange bilaterally without wheezing or rales. ABDOMEN: Soft and non-tender with normal pitched bowel sounds.  MUSCULOSKELETAL: There are no major deformities or cyanosis. NEUROLOGIC: No focal weakness or paresthesias are detected. SKIN: There are no ulcers or rashes noted. PSYCHIATRIC: The patient has a normal affect.  DATA:    VENOUS DUPLEX: I have reviewed the venous duplex scan that was done on 02/08/2018.  On the right side there is no evidence of deep venous thrombosis or superficial  thrombophlebitis.  There was deep venous reflux noted in the common femoral vein.  There was reflux at the saphenofemoral junction and the great saphenous vein in the distal thigh and at the knee.  On the left side there is no evidence of deep venous thrombosis or superficial thrombophlebitis.  There was abnormal reflux noted at the saphenofemoral junction in the great saphenous vein at the distal thigh and at the knee.  MEDICAL ISSUES:   CHRONIC VENOUS INSUFFICIENCY: This patient has painful varicose veins bilaterally.  She has CEAP clinical class IVa disease.  I think she is a good candidate for stab phlebectomies bilaterally.  She will require 10-20 stabs bilaterally.  She will also require sclerotherapy (2 units).  I have discussed the indications for stab phlebectomy and the potential complications and she is agreeable to proceed.  We will schedule this in the near future.  [name] [facility] [phone]

## 2018-03-12 DIAGNOSIS — R300 Dysuria: Secondary | ICD-10-CM | POA: Diagnosis not present

## 2018-03-12 DIAGNOSIS — N3020 Other chronic cystitis without hematuria: Secondary | ICD-10-CM | POA: Diagnosis not present

## 2018-03-15 DIAGNOSIS — I10 Essential (primary) hypertension: Secondary | ICD-10-CM | POA: Diagnosis not present

## 2018-03-15 DIAGNOSIS — K760 Fatty (change of) liver, not elsewhere classified: Secondary | ICD-10-CM | POA: Diagnosis not present

## 2018-03-15 DIAGNOSIS — R7309 Other abnormal glucose: Secondary | ICD-10-CM | POA: Diagnosis not present

## 2018-03-15 DIAGNOSIS — Z6835 Body mass index (BMI) 35.0-35.9, adult: Secondary | ICD-10-CM | POA: Diagnosis not present

## 2018-03-15 DIAGNOSIS — J4540 Moderate persistent asthma, uncomplicated: Secondary | ICD-10-CM | POA: Diagnosis not present

## 2018-03-15 DIAGNOSIS — Z0001 Encounter for general adult medical examination with abnormal findings: Secondary | ICD-10-CM | POA: Diagnosis not present

## 2018-03-15 DIAGNOSIS — R05 Cough: Secondary | ICD-10-CM | POA: Diagnosis not present

## 2018-03-15 DIAGNOSIS — E782 Mixed hyperlipidemia: Secondary | ICD-10-CM | POA: Diagnosis not present

## 2018-03-15 DIAGNOSIS — Z1389 Encounter for screening for other disorder: Secondary | ICD-10-CM | POA: Diagnosis not present

## 2018-03-15 DIAGNOSIS — E6609 Other obesity due to excess calories: Secondary | ICD-10-CM | POA: Diagnosis not present

## 2018-03-15 DIAGNOSIS — Z9884 Bariatric surgery status: Secondary | ICD-10-CM | POA: Diagnosis not present

## 2018-03-29 ENCOUNTER — Other Ambulatory Visit: Payer: Medicare Other | Admitting: Vascular Surgery

## 2018-04-03 ENCOUNTER — Other Ambulatory Visit (HOSPITAL_COMMUNITY): Payer: Self-pay | Admitting: Family Medicine

## 2018-04-03 ENCOUNTER — Ambulatory Visit (HOSPITAL_COMMUNITY)
Admission: RE | Admit: 2018-04-03 | Discharge: 2018-04-03 | Disposition: A | Discharge status: Home / Self Care | Payer: Medicare Other | Source: Ambulatory Visit | Attending: Family Medicine | Admitting: Family Medicine

## 2018-04-03 DIAGNOSIS — R059 Cough, unspecified: Secondary | ICD-10-CM

## 2018-04-03 DIAGNOSIS — R05 Cough: Secondary | ICD-10-CM

## 2018-04-07 ENCOUNTER — Other Ambulatory Visit: Payer: Self-pay

## 2018-04-07 ENCOUNTER — Encounter (HOSPITAL_COMMUNITY): Payer: Self-pay | Admitting: Emergency Medicine

## 2018-04-07 ENCOUNTER — Emergency Department (HOSPITAL_COMMUNITY)
Admission: EM | Admit: 2018-04-07 | Discharge: 2018-04-07 | Disposition: A | Discharge status: Home / Self Care | Payer: Medicare Other | Attending: Emergency Medicine | Admitting: Emergency Medicine

## 2018-04-07 ENCOUNTER — Emergency Department (HOSPITAL_COMMUNITY): Payer: Medicare Other

## 2018-04-07 DIAGNOSIS — I10 Essential (primary) hypertension: Secondary | ICD-10-CM | POA: Insufficient documentation

## 2018-04-07 DIAGNOSIS — M25572 Pain in left ankle and joints of left foot: Secondary | ICD-10-CM | POA: Diagnosis not present

## 2018-04-07 DIAGNOSIS — Z96652 Presence of left artificial knee joint: Secondary | ICD-10-CM | POA: Insufficient documentation

## 2018-04-07 DIAGNOSIS — M79672 Pain in left foot: Secondary | ICD-10-CM | POA: Diagnosis not present

## 2018-04-07 DIAGNOSIS — M7989 Other specified soft tissue disorders: Secondary | ICD-10-CM | POA: Diagnosis not present

## 2018-04-07 DIAGNOSIS — J45909 Unspecified asthma, uncomplicated: Secondary | ICD-10-CM | POA: Insufficient documentation

## 2018-04-07 DIAGNOSIS — Z79899 Other long term (current) drug therapy: Secondary | ICD-10-CM | POA: Diagnosis not present

## 2018-04-07 LAB — CBC WITH DIFFERENTIAL/PLATELET
Basophils Absolute: 0 10*3/uL (ref 0.0–0.1)
Basophils Relative: 0 %
Eosinophils Absolute: 0.3 10*3/uL (ref 0.0–0.7)
Eosinophils Relative: 4 %
HEMATOCRIT: 35.6 % — AB (ref 36.0–46.0)
Hemoglobin: 12.2 g/dL (ref 12.0–15.0)
LYMPHS ABS: 1.1 10*3/uL (ref 0.7–4.0)
LYMPHS PCT: 16 %
MCH: 34 pg (ref 26.0–34.0)
MCHC: 34.3 g/dL (ref 30.0–36.0)
MCV: 99.2 fL (ref 78.0–100.0)
MONO ABS: 0.9 10*3/uL (ref 0.1–1.0)
MONOS PCT: 13 %
NEUTROS ABS: 4.4 10*3/uL (ref 1.7–7.7)
Neutrophils Relative %: 67 %
Platelets: 248 10*3/uL (ref 150–400)
RBC: 3.59 MIL/uL — ABNORMAL LOW (ref 3.87–5.11)
RDW: 12.4 % (ref 11.5–15.5)
WBC: 6.6 10*3/uL (ref 4.0–10.5)

## 2018-04-07 LAB — BASIC METABOLIC PANEL
Anion gap: 9 (ref 5–15)
BUN: 8 mg/dL (ref 8–23)
CALCIUM: 9 mg/dL (ref 8.9–10.3)
CO2: 27 mmol/L (ref 22–32)
Chloride: 92 mmol/L — ABNORMAL LOW (ref 98–111)
Creatinine, Ser: 0.6 mg/dL (ref 0.44–1.00)
GFR calc Af Amer: >60 mL/min (ref >60)
GFR calc non Af Amer: >60 mL/min (ref >60)
Glucose, Bld: 113 mg/dL — ABNORMAL HIGH (ref 70–99)
Potassium: 3.6 mmol/L (ref 3.5–5.1)
Sodium: 128 mmol/L — ABNORMAL LOW (ref 135–145)

## 2018-04-07 LAB — URIC ACID: Uric Acid, Serum: 3.7 mg/dL (ref 2.5–7.1)

## 2018-04-07 MED ORDER — OXYCODONE-ACETAMINOPHEN 5-325 MG PO TABS
1.0000 | ORAL_TABLET | Freq: Once | ORAL | Status: AC
  Administered 2018-04-07: 1 via ORAL
  Filled 2018-04-07: qty 1

## 2018-04-07 MED ORDER — OXYCODONE-ACETAMINOPHEN 5-325 MG PO TABS: 1.0000 | ORAL_TABLET | ORAL | 0 refills | Status: DC | PRN

## 2018-04-07 MED ORDER — ONDANSETRON 8 MG PO TBDP
8.0000 mg | ORAL_TABLET | Freq: Once | ORAL | Status: AC
  Administered 2018-04-07: 8 mg via ORAL
  Filled 2018-04-07: qty 1

## 2018-04-07 MED ORDER — DEXAMETHASONE SODIUM PHOSPHATE 4 MG/ML IJ SOLN
8.0000 mg | Freq: Once | INTRAMUSCULAR | Status: AC
  Administered 2018-04-07: 8 mg via INTRAMUSCULAR
  Filled 2018-04-07: qty 2

## 2018-04-07 MED ORDER — DICLOFENAC SODIUM 50 MG PO TBEC: 50.0000 mg | DELAYED_RELEASE_TABLET | Freq: Two times a day (BID) | ORAL | 0 refills | Status: DC

## 2018-04-07 MED ORDER — DICLOFENAC SODIUM 50 MG PO TBEC
50.0000 mg | DELAYED_RELEASE_TABLET | Freq: Once | ORAL | Status: AC
  Administered 2018-04-07: 50 mg via ORAL
  Filled 2018-04-07 (×2): qty 1

## 2018-04-07 MED ORDER — PREDNISONE 10 MG PO TABS: 10.0000 mg | ORAL_TABLET | Freq: Every day | ORAL | 0 refills | Status: DC

## 2018-04-07 MED ORDER — HYDROMORPHONE HCL 1 MG/ML IJ SOLN
1.0000 mg | Freq: Once | INTRAMUSCULAR | Status: AC
  Administered 2018-04-07: 1 mg via INTRAMUSCULAR
  Filled 2018-04-07: qty 1

## 2018-04-07 NOTE — ED Provider Notes (Signed)
[facility] EMERGENCY DEPARTMENT Provider Note   CSN: [id number] Arrival date & time: 04/07/18  1246     History   Chief Complaint Chief Complaint  Patient presents with  . Foot Pain    HPI Caitlin [name] is a 72 y.o. female.  Patient reports pain, swelling, erythema of the left ankle for approximately 2 days.  No trauma, fever, sweats, chills.  Pain is worse with ambulation.  Severity of pain is moderate.  No prior history of gout.  No posterior calf or thigh pain, chest pain, dyspnea.     Past Medical History:  Diagnosis Date  . Arthritis   . Asthma   . Breast cancer ([place])   . Breast disorder    cancer  . Breast mass    benign  . GERD (gastroesophageal reflux disease)   . Heart murmur    AS CHILD  OUTGREW IT  . History of hiatal hernia   . Hyperlipidemia   . Hypertension   . Impaired fasting glucose   . Rectocele   . Varicose vein of leg     Patient Active Problem List   Diagnosis Date Noted  . Unilateral primary osteoarthritis, left knee 05/02/2017  . S/P total knee replacement using cement, left 05/02/2017  . Primary osteoarthritis of left knee 12/21/2016  . Chondromalacia patellae, left knee 05/26/2016  . Breast cancer of upper-inner quadrant of right female breast ([place]) 11/04/2015  . GERD (gastroesophageal reflux disease) 09/02/2013  . Obesity 09/02/2013  . Ventral hernia, recurrent 09/02/2013  . Unspecified asthma(493.90) 12/12/2012  . Cough 03/27/2012  . Right middle lobe syndrome 03/27/2012    Past Surgical History:  Procedure Laterality Date  . BREAST LUMPECTOMY WITH RADIOACTIVE SEED AND SENTINEL LYMPH NODE BIOPSY Right 11/11/2015   Procedure: RIGHT BREAST RADIOACTIVE SEED GUIDED  LUMPECTOMY AND RADIOACTIVE SEED TARGETED SENTINEL LYMPH NODE BIOPSY;  Surgeon: Stark Klein, MD;  Location: [place];  Service: General;  Laterality: Right;  . BREAST SURGERY Left    breast biopies x4  . CHOLECYSTECTOMY  09/08/2010  . COLONOSCOPY      . COLONOSCOPY N/A 09/25/2013   Procedure: COLONOSCOPY;  Surgeon: Rogene Houston, MD;  [facility];  Service: Endoscopy;  Laterality: N/A;  100-moved to 1200 Ann to notify pt  . HERNIA REPAIR  12/24/2010, 12-2014   TOTAL 4  . LAPAROSCOPIC GASTRIC SLEEVE RESECTION  11-2013   at [facility]  2007/2008  . TONSILLECTOMY  1968  . TOTAL KNEE ARTHROPLASTY Left 05/02/2017  . TOTAL KNEE ARTHROPLASTY Left 05/02/2017   Procedure: LEFT TOTAL KNEE ARTHROPLASTY;  Surgeon: [name], MD;  Location: [place];  Service: Orthopedics;  Laterality: Left;  . UPPER GASTROINTESTINAL ENDOSCOPY    . VAGINAL HYSTERECTOMY  1980s     OB History    Gravida  2   Para  2   Term  2   Preterm      AB      Living  2     SAB      TAB      Ectopic      Multiple      Live Births  2            Home Medications    Prior to Admission medications   Medication Sig Start Date End Date Taking? Authorizing Provider  acetaminophen (TYLENOL) 325 MG tablet Take 650 mg by mouth every 6 (six) hours as needed  for mild pain.  10/05/17  Yes [provider]  anastrozole (ARIMIDEX) 1 MG tablet Take 1 tablet (1 mg total) by mouth daily. 10/05/16  Yes Twana First, MD  B Complex Vitamins (B-COMPLEX/B-12 SL) Place 1 tablet under the tongue daily.    Yes [provider]  Calcium Citrate-Vitamin D (CALCIUM CITRATE CHEWY BITE) 500-500 MG-UNIT CHEW Chew 500 mg by mouth 2 (two) times daily.    Yes [provider]  cyclobenzaprine (FLEXERIL) 5 MG tablet TAKE 1 TABLET BY MOUTH 3 TIMES A DAY AS NEEDED FOR MUSCLE SPASMS 10/05/17  Yes [provider]  docusate sodium (COLACE) 100 MG capsule Take 100 mg by mouth at bedtime.    Yes [provider]  esomeprazole (NEXIUM) 20 MG capsule Take 40 mg by mouth daily before breakfast.    Yes [provider]  famotidine (PEPCID) 20 MG tablet Take 20 mg by mouth at bedtime.    Yes [provider]  Fluticasone-Salmeterol (ADVAIR) 250-50 MCG/DOSE AEPB Inhale 1 puff into the lungs 2 (two) times daily as needed (shortness of breath).   Yes [provider]  Iron-Vitamin C (IRON 100/C PO) Take 1 tablet by mouth daily. Celebrate iron + vit C   Yes [provider]  losartan-hydrochlorothiazide (HYZAAR) 100-25 MG tablet Take 1 tablet by mouth daily. 01/28/18  Yes [provider]  Magnesium 250 MG TABS Take 500 mg by mouth every evening.   Yes [provider]  MELATONIN PO Take 1 tablet by mouth at bedtime as needed (sleep).   Yes [provider]  Menaquinone-7 (VITAMIN K2) 100 MCG CAPS Take 100 mcg by mouth every evening.    Yes [provider]  Multiple Vitamin (MULTIVITAMIN) capsule Take 1 capsule by mouth 2 (two) times daily with a meal. Celebrate Bariatric MVI   Yes [provider]  pravastatin (PRAVACHOL) 20 MG tablet Take 20 mg by mouth at bedtime.    Yes [provider]  Probiotic Product (PROBIOTIC PO) Take 1 capsule by mouth daily.   Yes [provider]  amoxicillin (AMOXIL) 500 MG capsule TAKE 4 TABS 1 HOUR BEFORE WORK Patient not taking: Reported on 02/12/2018 01/03/18   [name], MD  diclofenac (VOLTAREN) 50 MG EC tablet Take 1 tablet (50 mg total) by mouth 2 (two) times daily. 04/07/18   Nat Christen, MD  oxyCODONE-acetaminophen (PERCOCET) 5-325 MG tablet Take 1 tablet by mouth every 4 (four) hours as needed. 04/07/18   Nat Christen, MD  predniSONE (DELTASONE) 10 MG tablet Take 1 tablet (10 mg total) by mouth daily with breakfast. 3 tablets for 3 days, 2 tablets for 3 days, 1 tablet for 3 days 04/07/18   Nat Christen, MD    Family History Family History  Problem Relation Age of Onset  . Colon cancer Father   . Rectal cancer Father   . Prostate cancer Father   . Dementia Mother   . Osteoporosis Mother   . Healthy Daughter   . Healthy Son   . Pancreatic cancer Paternal Grandfather   .  Heart disease Paternal Grandmother   . Heart disease Maternal Grandfather   . Breast cancer Maternal Grandmother   . Scoliosis Sister     Social History Social History   Tobacco Use  . Smoking status: Never Smoker  . Smokeless tobacco: Never Used  Substance Use Topics  . Alcohol use: No  . Drug use: No     Allergies   Tetanus toxoid adsorbed  Review of Systems Review of Systems  All other systems reviewed and are negative.    Physical Exam Updated Vital Signs BP (!) 143/79   Pulse 90   Temp 98.8 F (37.1 C) (Oral)   Resp 16   Ht 5\' 3"  (1.6 m)   Wt 93 kg   SpO2 100%   BMI 36.31 kg/m   Physical Exam  Constitutional: She is oriented to person, place, and time. She appears well-developed and well-nourished.  HENT:  Head: Normocephalic and atraumatic.  Eyes: Conjunctivae are normal.  Neck: Neck supple.  Cardiovascular: Normal rate and regular rhythm.  Pulmonary/Chest: Effort normal and breath sounds normal.  Abdominal: Soft. Bowel sounds are normal.  Musculoskeletal:  Left lower extremity: No posterior thigh or calf tenderness.  Ankle is erythematous, edematous, tender.  Foot nontender.  Neurological: She is alert and oriented to person, place, and time.  Skin: Skin is warm and dry.  Psychiatric: She has a normal mood and affect. Her behavior is normal.  Nursing note and vitals reviewed.    ED Treatments / Results  Labs (all labs ordered are listed, but only abnormal results are displayed) Labs Reviewed  CBC WITH DIFFERENTIAL/PLATELET - Abnormal; Notable for the following components:      Result Value   RBC 3.59 (*)    HCT 35.6 (*)    All other components within normal limits  BASIC METABOLIC PANEL - Abnormal; Notable for the following components:   Sodium 128 (*)    Chloride 92 (*)    Glucose, Bld 113 (*)    All other components within normal limits  URIC ACID    EKG None  Radiology Dg Ankle Complete Left  Result Date: 04/07/2018 CLINICAL  DATA:  Diffuse left ankle and foot pain and swelling for the past 2 days. No known injury. EXAM: LEFT ANKLE COMPLETE - 3+ VIEW COMPARISON:  Left foot radiographs obtained at the same time. FINDINGS: Diffuse soft tissue swelling, marked medially. Mild distal medial malleolus spur formation. Previously described spurs arising from the calcaneus and base of the 5th metatarsal. No soft tissue gas, bone destruction, periosteal reaction, fracture or dislocation. IMPRESSION: Soft tissue swelling and degenerative changes, as described above. Electronically Signed   By: Claudie Revering M.D.   On: 04/07/2018 15:01   Dg Foot Complete Left  Result Date: 04/07/2018 CLINICAL DATA:  Left foot and ankle pain and swelling for the past 2 days. No known injury. EXAM: LEFT FOOT - COMPLETE 3+ VIEW COMPARISON:  Left ankle obtained at the same time. FINDINGS: Diffuse soft tissue swelling at the level of the ankle. There is also soft tissue swelling in the proximal foot, most pronounced dorsally. Moderate inferior and minimal posterior calcaneal spur formation. Mild spur formation arising from the base of the 5th metatarsal. Mild spur formation at the navicular medial cuneiform articulation. No fracture or dislocation seen. IMPRESSION: Soft tissue swelling and degenerative changes, as described above. Electronically Signed   By: Claudie Revering M.D.   On: 04/07/2018 14:59    Procedures Procedures (including critical care time)  Medications Ordered in ED Medications  HYDROmorphone (DILAUDID) injection 1 mg (1 mg Intramuscular Given 04/07/18 1656)  dexamethasone (DECADRON) injection 8 mg (8 mg Intramuscular Given 04/07/18 1914)  diclofenac (VOLTAREN) EC tablet 50 mg (50 mg Oral Given 04/07/18 1924)  oxyCODONE-acetaminophen (PERCOCET/ROXICET) 5-325 MG per tablet 1 tablet (1 tablet Oral Given 04/07/18 1914)  ondansetron (ZOFRAN-ODT) disintegrating tablet 8 mg (8 mg Oral Given 04/07/18 1914)  Initial Impression / Assessment and Plan / ED  Course  I have reviewed the triage vital signs and the nursing notes.  Pertinent labs & imaging results that were available during my care of the patient were reviewed by me and considered in my medical decision making (see chart for details).     History and physical most consistent with a gout-like event.  White count normal.  Uric acid normal.  Plain films of left foot and left ankle negative for fracture.  Patient given IM Dilaudid, IM Decadron, oral Voltaren and Percocet in the emergency department.  Discharge medications Voltaren 50 mg, Percocet, prednisone.  Findings discussed with the patient and her husband.  She has primary care follow-up  Final Clinical Impressions(s) / ED Diagnoses   Final diagnoses:  Acute left ankle pain    ED Discharge Orders         Ordered    diclofenac (VOLTAREN) 50 MG EC tablet  2 times daily     04/07/18 1938    oxyCODONE-acetaminophen (PERCOCET) 5-325 MG tablet  Every 4 hours PRN     04/07/18 1938    predniSONE (DELTASONE) 10 MG tablet  Daily with breakfast     04/07/18 1938           Nat Christen, MD 04/07/18 2012

## 2018-04-07 NOTE — ED Triage Notes (Signed)
Patient c/o left foot and ankle pain and swelling that started 2 days ago. Denies any known injury. Denies any shortness of breath. Per patient pain worse with walking. Foot warm, capillary refill WNL. Ankle tender to touch, worse with walking. Unable to palpate pulse due to swelling.

## 2018-04-07 NOTE — ED Notes (Signed)
Dr C in to assess 

## 2018-04-07 NOTE — Discharge Instructions (Addendum)
Elevate ankle, minimize walking, ice pack.  Prescription for prednisone, pain medicine, anti-inflammatory medicine.  Follow-up with your primary care doctor.

## 2018-04-07 NOTE — ED Notes (Signed)
Pt reports 2 days  Of painful ambulation, swelling to her ankle   Denies injury   Ankle swollen and red  Painful to palpation

## 2018-04-09 ENCOUNTER — Other Ambulatory Visit: Payer: Self-pay

## 2018-04-09 ENCOUNTER — Encounter (HOSPITAL_COMMUNITY): Payer: Self-pay

## 2018-04-09 ENCOUNTER — Emergency Department (HOSPITAL_COMMUNITY)
Admission: EM | Admit: 2018-04-09 | Discharge: 2018-04-09 | Disposition: A | Discharge status: Home / Self Care | Payer: Medicare Other | Attending: Emergency Medicine | Admitting: Emergency Medicine

## 2018-04-09 DIAGNOSIS — Z96652 Presence of left artificial knee joint: Secondary | ICD-10-CM | POA: Diagnosis not present

## 2018-04-09 DIAGNOSIS — L03116 Cellulitis of left lower limb: Secondary | ICD-10-CM | POA: Insufficient documentation

## 2018-04-09 DIAGNOSIS — J45909 Unspecified asthma, uncomplicated: Secondary | ICD-10-CM | POA: Diagnosis not present

## 2018-04-09 DIAGNOSIS — M25572 Pain in left ankle and joints of left foot: Secondary | ICD-10-CM | POA: Diagnosis present

## 2018-04-09 DIAGNOSIS — Z79899 Other long term (current) drug therapy: Secondary | ICD-10-CM | POA: Insufficient documentation

## 2018-04-09 DIAGNOSIS — I10 Essential (primary) hypertension: Secondary | ICD-10-CM | POA: Diagnosis not present

## 2018-04-09 MED ORDER — LIDOCAINE HCL (PF) 1 % IJ SOLN
INTRAMUSCULAR | Status: AC
  Administered 2018-04-09: 2 mL
  Filled 2018-04-09: qty 2

## 2018-04-09 MED ORDER — HYDROMORPHONE HCL 1 MG/ML IJ SOLN
1.0000 mg | Freq: Once | INTRAMUSCULAR | Status: AC
  Administered 2018-04-09: 1 mg via INTRAMUSCULAR
  Filled 2018-04-09: qty 1

## 2018-04-09 MED ORDER — SODIUM CHLORIDE 0.9 % IV SOLN: 1.0000 g | Freq: Once | INTRAVENOUS | Status: DC

## 2018-04-09 MED ORDER — CEFTRIAXONE SODIUM 1 G IJ SOLR
1.0000 g | INTRAMUSCULAR | Status: DC
  Administered 2018-04-09: 1 g via INTRAMUSCULAR
  Filled 2018-04-09: qty 10

## 2018-04-09 MED ORDER — SULFAMETHOXAZOLE-TRIMETHOPRIM 800-160 MG PO TABS: 1.0000 | ORAL_TABLET | Freq: Two times a day (BID) | ORAL | 0 refills | Status: AC

## 2018-04-09 NOTE — Discharge Instructions (Signed)
See Dr. Hilma Favors for recheck in 2 days

## 2018-04-09 NOTE — ED Triage Notes (Signed)
Pt c/o pain to left ankle.  Area red and swollen.  Left lower leg swollen greater than r but pt says is normal for her.  Pedal pulse present.  Pt says was here Sat for same and was told it was arthritis.  Pt was given prescriptions and is taking them as prescribed but no relief.

## 2018-04-09 NOTE — ED Provider Notes (Signed)
Medical screening examination/treatment/procedure(s) were conducted as a shared visit with non-physician practitioner(s) and myself.  I personally evaluated the patient during the encounter.  Seen here few days ago and diagnosed with arthritis her left ankle went home and was treatments that she had approximately day of improvement but then got worse again and her left ankle is red, warm, swollen tender to touch.  She does not seem to have any joint pain with range of motion just more medial ankle pain.  No history of gout.  I think this is more likely to be cellulitis.  She does appear septic.  No indication for labs at this point.  We will start antibiotics and discharge with PCP follow-up.   Merrily Pew, MD 04/10/18 1116

## 2018-04-10 NOTE — ED Provider Notes (Signed)
[facility] EMERGENCY DEPARTMENT Provider Note   CSN: [id number] Arrival date & time: 04/09/18  0913     History   Chief Complaint Chief Complaint  Patient presents with  . Ankle Pain    HPI Caitlin [name] is a 72 y.o. female.  The history is provided by the patient. No language interpreter was used.  Ankle Pain   The incident occurred 2 days ago. The incident occurred at home. There was no injury mechanism. The pain is present in the left ankle. The quality of the pain is described as aching. The pain is moderate. The pain has been constant since onset. Pertinent negatives include no numbness. She reports no foreign bodies present. She has tried nothing for the symptoms. The treatment provided no relief.  Pt was seen and diagnosed with inflammation of foot,  Pt reports she has had increasing redness and pain   Past Medical History:  Diagnosis Date  . Arthritis   . Asthma   . Breast cancer ([place])   . Breast disorder    cancer  . Breast mass    benign  . GERD (gastroesophageal reflux disease)   . Heart murmur    AS CHILD  OUTGREW IT  . History of hiatal hernia   . Hyperlipidemia   . Hypertension   . Impaired fasting glucose   . Rectocele   . Varicose vein of leg     Patient Active Problem List   Diagnosis Date Noted  . Unilateral primary osteoarthritis, left knee 05/02/2017  . S/P total knee replacement using cement, left 05/02/2017  . Primary osteoarthritis of left knee 12/21/2016  . Chondromalacia patellae, left knee 05/26/2016  . Breast cancer of upper-inner quadrant of right female breast ([place]) 11/04/2015  . GERD (gastroesophageal reflux disease) 09/02/2013  . Obesity 09/02/2013  . Ventral hernia, recurrent 09/02/2013  . Unspecified asthma(493.90) 12/12/2012  . Cough 03/27/2012  . Right middle lobe syndrome 03/27/2012    Past Surgical History:  Procedure Laterality Date  . BREAST LUMPECTOMY WITH RADIOACTIVE SEED AND SENTINEL LYMPH NODE BIOPSY Right  11/11/2015   Procedure: RIGHT BREAST RADIOACTIVE SEED GUIDED  LUMPECTOMY AND RADIOACTIVE SEED TARGETED SENTINEL LYMPH NODE BIOPSY;  Surgeon: Stark Klein, MD;  Location: [place];  Service: General;  Laterality: Right;  . BREAST SURGERY Left    breast biopies x4  . CHOLECYSTECTOMY  09/08/2010  . COLONOSCOPY    . COLONOSCOPY N/A 09/25/2013   Procedure: COLONOSCOPY;  Surgeon: Rogene Houston, MD;  [facility];  Service: Endoscopy;  Laterality: N/A;  100-moved to 1200 Ann to notify pt  . HERNIA REPAIR  12/24/2010, 12-2014   TOTAL 4  . LAPAROSCOPIC GASTRIC SLEEVE RESECTION  11-2013   at [place]  2007/2008  . TONSILLECTOMY  1968  . TOTAL KNEE ARTHROPLASTY Left 05/02/2017  . TOTAL KNEE ARTHROPLASTY Left 05/02/2017   Procedure: LEFT TOTAL KNEE ARTHROPLASTY;  Surgeon: [name], MD;  Location: [place];  Service: Orthopedics;  Laterality: Left;  . UPPER GASTROINTESTINAL ENDOSCOPY    . VAGINAL HYSTERECTOMY  1980s     OB History    Gravida  2   Para  2   Term  2   Preterm      AB      Living  2     SAB      TAB      Ectopic      Multiple  Live Births  2            Home Medications    Prior to Admission medications   Medication Sig Start Date End Date Taking? Authorizing Provider  anastrozole (ARIMIDEX) 1 MG tablet Take 1 tablet (1 mg total) by mouth daily. 10/05/16  Yes Twana First, MD  B Complex Vitamins (B-COMPLEX/B-12 SL) Place 1 tablet under the tongue daily.    Yes [provider]  Calcium Citrate-Vitamin D (CALCIUM CITRATE CHEWY BITE) 500-500 MG-UNIT CHEW Chew 500 mg by mouth 2 (two) times daily.    Yes [provider]  diclofenac (VOLTAREN) 50 MG EC tablet Take 1 tablet (50 mg total) by mouth 2 (two) times daily. 04/07/18  Yes Nat Christen, MD  docusate sodium (COLACE) 100 MG capsule Take 100 mg by mouth at bedtime.    Yes [provider]  esomeprazole (NEXIUM) 20 MG  capsule Take 40 mg by mouth daily before breakfast.    Yes [provider]  famotidine (PEPCID) 20 MG tablet Take 20 mg by mouth at bedtime.    Yes [provider]  Fluticasone-Salmeterol (ADVAIR) 250-50 MCG/DOSE AEPB Inhale 1 puff into the lungs 2 (two) times daily as needed (shortness of breath).   Yes [provider]  Iron-Vitamin C (IRON 100/C PO) Take 1 tablet by mouth daily. Celebrate iron + vit C   Yes [provider]  losartan-hydrochlorothiazide (HYZAAR) 100-25 MG tablet Take 1 tablet by mouth daily. 01/28/18  Yes [provider]  Magnesium 250 MG TABS Take 500 mg by mouth every evening.   Yes [provider]  MELATONIN PO Take 1 tablet by mouth at bedtime as needed (sleep).   Yes [provider]  Menaquinone-7 (VITAMIN K2) 100 MCG CAPS Take 100 mcg by mouth every evening.    Yes [provider]  Multiple Vitamin (MULTIVITAMIN) capsule Take 1 capsule by mouth 2 (two) times daily with a meal. Celebrate Bariatric MVI   Yes [provider]  oxyCODONE-acetaminophen (PERCOCET) 5-325 MG tablet Take 1 tablet by mouth every 4 (four) hours as needed. 04/07/18  Yes Nat Christen, MD  pravastatin (PRAVACHOL) 20 MG tablet Take 20 mg by mouth at bedtime.    Yes [provider]  predniSONE (DELTASONE) 10 MG tablet Take 1 tablet (10 mg total) by mouth daily with breakfast. 3 tablets for 3 days, 2 tablets for 3 days, 1 tablet for 3 days 04/07/18  Yes Nat Christen, MD  sulfamethoxazole-trimethoprim (BACTRIM DS,SEPTRA DS) 800-160 MG tablet Take 1 tablet by mouth 2 (two) times daily for 7 days. 04/09/18 04/16/18  Fransico Meadow, PA-C    Family History Family History  Problem Relation Age of Onset  . Colon cancer Father   . Rectal cancer Father   . Prostate cancer Father   . Dementia Mother   . Osteoporosis Mother   . Healthy Daughter   . Healthy Son   . Pancreatic cancer Paternal Grandfather   . Heart disease Paternal  Grandmother   . Heart disease Maternal Grandfather   . Breast cancer Maternal Grandmother   . Scoliosis Sister     Social History Social History   Tobacco Use  . Smoking status: Never Smoker  . Smokeless tobacco: Never Used  Substance Use Topics  . Alcohol use: No  . Drug use: No     Allergies   Tetanus toxoid adsorbed   Review of Systems Review of Systems  Neurological: Negative for numbness.  All other systems  reviewed and are negative.    Physical Exam Updated Vital Signs BP (!) 142/78 (BP Location: Left Arm)   Pulse 80   Temp 97.8 F (36.6 C) (Oral)   Resp 18   SpO2 97%   Physical Exam  Constitutional: She is oriented to person, place, and time. She appears well-developed and well-nourished.  HENT:  Head: Normocephalic.  Eyes: EOM are normal.  Neck: Normal range of motion.  Pulmonary/Chest: Effort normal.  Abdominal: She exhibits no distension.  Musculoskeletal: Normal range of motion. She exhibits edema and tenderness.  Swollen left ankle ,  Pain with movement,  nv and ns intact   Neurological: She is alert and oriented to person, place, and time.  Skin: Skin is warm.  Psychiatric: She has a normal mood and affect.  Nursing note and vitals reviewed.    ED Treatments / Results  Labs (all labs ordered are listed, but only abnormal results are displayed) Labs Reviewed - No data to display  EKG None  Radiology No results found.  Procedures Procedures (including critical care time)  Medications Ordered in ED Medications  HYDROmorphone (DILAUDID) injection 1 mg (1 mg Intramuscular Given 04/09/18 1134)  lidocaine (PF) (XYLOCAINE) 1 % injection (2 mLs  Given 04/09/18 1134)     Initial Impression / Assessment and Plan / ED Course  I have reviewed the triage vital signs and the nursing notes.  Pertinent labs & imaging results that were available during my care of the patient were reviewed by me and considered in my medical decision making (see  chart for details).     MDM  Pt given Rocephin 1gram IM.   Pt advised to follow up with Dr. Hilma Favors for recheck in 2 days.  Pt advised to return if symptoms worsen or cahnge   Final Clinical Impressions(s) / ED Diagnoses   Final diagnoses:  Cellulitis of left ankle    ED Discharge Orders         Ordered    sulfamethoxazole-trimethoprim (BACTRIM DS,SEPTRA DS) 800-160 MG tablet  2 times daily     04/09/18 1112        An After Visit Summary was printed and given to the patient.    Fransico Meadow, [place] 04/10/18 1100    Mesner, Corene Cornea, MD 04/10/18 1116

## 2018-04-12 ENCOUNTER — Other Ambulatory Visit: Payer: Medicare Other | Admitting: Vascular Surgery

## 2018-04-12 DIAGNOSIS — L03116 Cellulitis of left lower limb: Secondary | ICD-10-CM | POA: Diagnosis not present

## 2018-04-12 DIAGNOSIS — Z6836 Body mass index (BMI) 36.0-36.9, adult: Secondary | ICD-10-CM | POA: Diagnosis not present

## 2018-04-12 DIAGNOSIS — E6609 Other obesity due to excess calories: Secondary | ICD-10-CM | POA: Diagnosis not present

## 2018-04-13 ENCOUNTER — Ambulatory Visit (INDEPENDENT_AMBULATORY_CARE_PROVIDER_SITE_OTHER): Payer: Medicare Other | Admitting: Internal Medicine

## 2018-04-13 ENCOUNTER — Encounter: Payer: Self-pay | Admitting: Internal Medicine

## 2018-04-13 VITALS — BP 120/64 | HR 92 | Ht 62.0 in | Wt 208.0 lb

## 2018-04-13 DIAGNOSIS — J45991 Cough variant asthma: Secondary | ICD-10-CM

## 2018-04-13 MED ORDER — BUDESONIDE-FORMOTEROL FUMARATE 80-4.5 MCG/ACT IN AERO: 2.0000 | INHALATION_SPRAY | Freq: Two times a day (BID) | RESPIRATORY_TRACT | 0 refills | Status: DC

## 2018-04-13 MED ORDER — BUDESONIDE-FORMOTEROL FUMARATE 80-4.5 MCG/ACT IN AERO: 2.0000 | INHALATION_SPRAY | Freq: Two times a day (BID) | RESPIRATORY_TRACT | 11 refills | Status: DC

## 2018-04-13 NOTE — Progress Notes (Signed)
Caitlin [name], female    DOB: [birth year],    MRN: [id number]    Brief patient profile:  51 yowf never smoker asthma as child outgrew it about age teens then some seasonal sinus problems since the 1990s  but around 2007 developed recurrent pattern of breathing problems esp in spring dx as asthma > asmanex and singulair seemed to work ok until around 2012 when had her 1st hernia surgery and since then symptoms persistent year round so 03/27/2012 referred to pulmonary clinic   History of Present Illness  03/27/2012 1st pulmonary ov / Wert cc > one year daily p stirs in am (but doesn't wake her) cc sense of  congestion/variable pattern prod of thick sputum/ variable color better p neb with alb/pulmicort.  Sinus spring > fall and not an active problem. Never rx with prednisone. No gagging, some urination.  All started p Feb 2012 p lap chole and several other procedures and coughs so hard it's causing a problem with the wound healing resulting in worsening hernia and has been told she will need more surgery. rec Nexium 40 mg Take 30-60 min before first meal of the day and Pepcid 20 mg one at bedtime until return mucinex dm for cough 2 every 12 hours GERD   Prednisone 10 mg take  4 each am x 2 days,   2 each am x 2 days,  1 each am x2days and stop  Stop asmanex and continue pulmicort with albuterol twice daily        12/11/2012 f/u ov/Wert re recurrent cough     Chief Complaint  Patient presents with  . Follow-up    Cough x 3 months- prod with minimal green sputum. No specific trigger she can find for the cough.   only using one half mucinex in am and, using neb twice daily with alb and budesonide , no sob. In retrtrospect never really 100% better and thinks symbicort worked better than anything else >augmentin  And steroid pack      03/05/2013 f/u ov/Wert re asthma     Chief Complaint  Patient presents with  . Follow-up    Breathing has improved.  No SOB, wheezing, chest  tightenss, chest pain, or cough noted at this time.  On  symbicort 160 one twice daily also singulair and never nebulizer any more rec Ok to stop singulair Plan A = automatic = Symbicort 160 Take 1 puffs first thing in am and then another 2 puffs about 12 hours later - if doing great off singulair in a couple of weeks then ok to stop the symbicort too - it will start working in 5 min if needed  Plan B = backup Only use your albuterol as a rescue medication to be used For cough > mucinex dm 1200 every 12 hours as needed  For cough/ congestion > mucinex as needed     04/13/2018  Pulmonary/ re-establish re recurrent cough  Chief Complaint  Patient presents with  . Pulmonary Consult    Referred by Dr. Hilma Favors. Pt c/o cough for the past months- occ prod with green to yellow sputum.    couple times a year tends to flare after stops meds/ never restarted maint symb/ takes advair dpi "prn"  S/p gastric sleeve 2015 got down to 172 but slowly increasing S/p R breast ca/ rad on Right side only completed July 2017  Cough x sev months x  Daytime  Maybe worse in heat/ not after meals or hs  Not limited by breathing from desired activities  Presently on pred/ abx for L leg rash ? cellulitis  No obvious patterns in day to day or daytime variability or assoc  mucus plugs or hemoptysis or cp or chest tightness, subjective wheeze or overt sinus or hb symptoms.   sleeps ok  without nocturnal  or early am exacerbation  of respiratory  c/o's or need for noct saba. Also denies any obvious fluctuation of symptoms with weather or environmental changes or other aggravating or alleviating factors except as outlined above   No unusual exposure hx or h/o childhood pna  or knowledge of premature birth.  Current Allergies, Complete Past Medical History, Past Surgical History, Family History, and Social History were reviewed in [employer] record.  ROS  The following are not active complaints  unless bolded Hoarseness, sore throat, dysphagia, dental problems, itching, sneezing,  nasal congestion or discharge of excess mucus or purulent secretions, ear ache,   fever, chills, sweats, unintended wt loss or wt gain, classically pleuritic or exertional cp,  orthopnea pnd or arm/hand swelling  or leg swelling, presyncope, palpitations, abdominal pain, anorexia, nausea, vomiting, diarrhea  or change in bowel habits or change in bladder habits, change in stools or change in urine, dysuria, hematuria,  rash, arthralgias, visual complaints, headache, numbness, weakness or ataxia or problems with walking or coordination,  change in mood or  memory.              Past Medical History:  Diagnosis Date  . Arthritis   . Asthma   . Breast cancer ([place])   . Breast disorder    cancer  . Breast mass    benign  . GERD (gastroesophageal reflux disease)   . Heart murmur    AS CHILD  OUTGREW IT  . History of hiatal hernia   . Hyperlipidemia   . Hypertension   . Impaired fasting glucose   . Rectocele   . Varicose vein of leg     Outpatient Medications Prior to Visit- not able to verify this is accurate  Medication Sig Dispense Refill  . anastrozole (ARIMIDEX) 1 MG tablet Take 1 tablet (1 mg total) by mouth daily. 90 tablet 2  . B Complex Vitamins (B-COMPLEX/B-12 SL) Place 1 tablet under the tongue daily.     . Calcium Citrate-Vitamin D (CALCIUM CITRATE CHEWY BITE) 500-500 MG-UNIT CHEW Chew 500 mg by mouth 2 (two) times daily.     . diclofenac (VOLTAREN) 50 MG EC tablet Take 1 tablet (50 mg total) by mouth 2 (two) times daily. 20 tablet 0  . docusate sodium (COLACE) 100 MG capsule Take 100 mg by mouth at bedtime.     [name] esomeprazole (NEXIUM) 20 MG capsule Take 40 mg by mouth daily before breakfast.     . famotidine (PEPCID) 20 MG tablet Take 20 mg by mouth at bedtime.     . Fluticasone-Salmeterol (ADVAIR) 250-50 MCG/DOSE AEPB Inhale 1 puff into the lungs 2 (two) times daily as needed (shortness  of breath).    . Iron-Vitamin C (IRON 100/C PO) Take 1 tablet by mouth daily. Celebrate iron + vit C    . losartan-hydrochlorothiazide (HYZAAR) 100-25 MG tablet Take 1 tablet by mouth daily.  1  . Magnesium 250 MG TABS Take 500 mg by mouth every evening.    [name] MELATONIN PO Take 1 tablet by mouth at bedtime as needed (sleep).    . Menaquinone-7 (VITAMIN K2) 100 MCG CAPS Take 100 mcg  by mouth every evening.     . Multiple Vitamin (MULTIVITAMIN) capsule Take 1 capsule by mouth 2 (two) times daily with a meal. Celebrate Bariatric MVI    . oxyCODONE-acetaminophen (PERCOCET) 5-325 MG tablet Take 1 tablet by mouth every 4 (four) hours as needed. 20 tablet 0  . pravastatin (PRAVACHOL) 20 MG tablet Take 20 mg by mouth at bedtime.     . predniSONE (DELTASONE) 10 MG tablet Take 1 tablet (10 mg total) by mouth daily with breakfast. 3 tablets for 3 days, 2 tablets for 3 days, 1 tablet for 3 days 18 tablet 0  . sulfamethoxazole-trimethoprim (BACTRIM DS,SEPTRA DS) 800-160 MG tablet Take 1 tablet by mouth 2 (two) times daily for 7 days. 14 tablet 0   No facility-administered medications prior to visit.              Objective:     BP 120/64 (BP Location: Left Arm, Cuff Size: Normal)   Pulse 92   Ht 5\' 2"  (1.575 m)   Wt 208 lb (94.3 kg)   SpO2 97%   BMI 38.04 kg/m   SpO2: 97 % RA  Wt Readings from Last 3 Encounters:  04/13/18 208 lb (94.3 kg)  04/07/18 205 lb (93 kg)  02/12/18 205 lb (93 kg)    amb pleasant mod obese wf nad  HEENT: nl dentition, turbinates bilaterally, and oropharynx. Nl external ear canals without cough reflex   NECK :  without JVD/Nodes/TM/ nl carotid upstrokes bilaterally   LUNGS: no acc muscle use,  Nl contour chest which is clear to A and P bilaterally with cough on exp maneuvers esp fvc   CV:  RRR  no s3 or murmur or increase in P2, and no significant pitting edema   ABD:  soft and nontender with nl inspiratory excursion in the supine position. No bruits or  organomegaly appreciated, bowel sounds nl  MS:  Nl gait/ ext warm without deformities, calf tenderness, cyanosis or clubbing No obvious joint restrictions   SKIN: warm and dry with bandage over L medial ankle with mild surrounding erythema  NEURO:  alert, approp, nl sensorium with  no motor or cerebellar deficits apparent.      I personally reviewed images and agree with radiology impression as follows:  CXR:   04/03/18  No acute cardiopulmonary disease.    Assessment   Cough variant asthma - Eos 0.3 04/07/18 - d/c advair 04/13/2018  - 04/13/2018  After extensive coaching inhaler device,  effectiveness =    50% try symb 80 2bid    DDX of  difficult airways management almost all start with A and  include Adherence, Ace Inhibitors, Acid Reflux, Active Sinus Disease, Alpha 1 Antitripsin deficiency, Anxiety masquerading as Airways dz,  ABPA,  Allergy(esp in young), Aspiration (esp in elderly), Adverse effects of meds,  Active smokers, A bunch of PE's (a small clot burden can't cause this syndrome unless there is already severe underlying pulm or vascular dz with poor reserve) plus two Bs  = Bronchiectasis and Beta blocker use..and one C= CHF   Adherence is always the initial "prime suspect" and is a multilayered concern that requires a "trust but verify" approach in every patient - starting with knowing how to use medications, especially inhalers, correctly, keeping up with refills and understanding the fundamental difference between maintenance and prns vs those medications only taken for a very short course and then stopped and not refilled.  - see hfa teaching - return with all meds  in hand using a trust but verify approach to confirm accurate Medication  Reconciliation The principal here is that until we are certain that the  patients are doing what we've asked, it makes no sense to ask them to do more.   ? Acid (or non-acid) GERD > always difficult to exclude as up to 75% of pts in some  series report no assoc GI/ Heartburn symptoms> rec continue max (24h)  acid suppression and diet restrictions/ reviewed     ? Adverse effects of dpi > try off advair on on low dose symb   ? Allergy > consider testing at next ov if off prednisone then  but low dose ics / laba worked fine before and should again if can afford them: Discussed  Formulary restrictions will be an ongoing challenge for the forseable future and I would be happy to pick an alternative if the pt will first  provide me a list of them but pt  will need to return here for training for any new device that is required eg dpi vs hfa vs respimat.    In meantime we can always provide samples so the patient never runs out of any needed respiratory medications.   ? Active sinus dz > consider sinus ct next ov   ? acei related For reasons that may related to vascular permability and nitric oxide pathways but not elevated  bradykinin levels (as seen with  ACEi use) losartan in the generic form has been reported now from mulitple sources  to cause a similar pattern of non-specific  upper airway symptoms as seen with acei.   This has not been reported with exposure to the other ARB's to date, may need to try either generic diovan or avapro if ARB needed or use an alternative class altogether but for now hold off on any changes   See:  Ann Allergy Asthma Immunol  2008: 101: p [phone]      Total time devoted to counseling  > 50 % of initial 60 min office visit:  review case with pt/husband discussion of options/alternatives/ personally creating written customized instructions  in presence of pt  then going over those specific  Instructions directly with the pt including how to use all of the meds but in particular covering each new medication in detail and the difference between the maintenance= "automatic" meds and the prns using an action plan format for the latter (If this problem/symptom => do that organization reading Left to right).   Please see AVS from this visit for a full list of these instructions which I personally wrote for this pt and  are unique to this visit.   See device teaching which extended face to face time for this visit        Christinia Gully, MD 04/13/2018

## 2018-04-13 NOTE — Patient Instructions (Addendum)
Nexium 40 mg Take 30-60 min before first meal of the day plus  pepcid 20 mg after supper   symbicort 80 Take 2 puffs first thing in am and then another 2 puffs about 12 hours later.   Work on inhaler technique:  relax and gently blow all the way out then take a nice smooth deep breath back in, triggering the inhaler at same time you start breathing in.  Hold for up to 5 seconds if you can. Blow out thru nose. Rinse and gargle with water when done  For cough > delsym 2 tsp every 12 hours    GERD (REFLUX)  is an extremely common cause of respiratory symptoms just like yours , many times with no obvious heartburn at all.    It can be treated with medication, but also with lifestyle changes including elevation of the head of your bed (ideally with 6 inch  bed blocks),  Smoking cessation, avoidance of late meals, excessive alcohol, and avoid fatty foods, chocolate, peppermint, colas, red wine, and acidic juices such as orange juice.  NO MINT OR MENTHOL PRODUCTS SO NO COUGH DROPS   USE SUGARLESS CANDY INSTEAD (Jolley ranchers or Stover's or Life Savers) or even ice chips will also do - the key is to swallow to prevent all throat clearing. NO OIL BASED VITAMINS - use powdered substitutes.   Please schedule a follow up office visit in 4 weeks, sooner if needed

## 2018-04-14 ENCOUNTER — Encounter: Payer: Self-pay | Admitting: Internal Medicine

## 2018-04-14 NOTE — Assessment & Plan Note (Signed)
- Eos 0.3 04/07/18 - d/c advair 04/13/2018  - 04/13/2018  After extensive coaching inhaler device,  effectiveness =    50% try symb 80 2bid    DDX of  difficult airways management almost all start with A and  include Adherence, Ace Inhibitors, Acid Reflux, Active Sinus Disease, Alpha 1 Antitripsin deficiency, Anxiety masquerading as Airways dz,  ABPA,  Allergy(esp in young), Aspiration (esp in elderly), Adverse effects of meds,  Active smokers, A bunch of PE's (a small clot burden can't cause this syndrome unless there is already severe underlying pulm or vascular dz with poor reserve) plus two Bs  = Bronchiectasis and Beta blocker use..and one C= CHF   Adherence is always the initial "prime suspect" and is a multilayered concern that requires a "trust but verify" approach in every patient - starting with knowing how to use medications, especially inhalers, correctly, keeping up with refills and understanding the fundamental difference between maintenance and prns vs those medications only taken for a very short course and then stopped and not refilled.  - see hfa teaching - return with all meds in hand using a trust but verify approach to confirm accurate Medication  Reconciliation The principal here is that until we are certain that the  patients are doing what we've asked, it makes no sense to ask them to do more.   ? Acid (or non-acid) GERD > always difficult to exclude as up to 75% of pts in some series report no assoc GI/ Heartburn symptoms> rec continue max (24h)  acid suppression and diet restrictions/ reviewed     ? Adverse effects of dpi > try off advair on on low dose symb   ? Allergy > consider testing at next ov if off prednisone then  but low dose ics / laba worked fine before and should again if can afford them: Discussed  Formulary restrictions will be an ongoing challenge for the forseable future and I would be happy to pick an alternative if the pt will first  provide me a list of them  but pt  will need to return here for training for any new device that is required eg dpi vs hfa vs respimat.    In meantime we can always provide samples so the patient never runs out of any needed respiratory medications.   ? Active sinus dz > consider sinus ct next ov   ? acei related For reasons that may related to vascular permability and nitric oxide pathways but not elevated  bradykinin levels (as seen with  ACEi use) losartan in the generic form has been reported now from mulitple sources  to cause a similar pattern of non-specific  upper airway symptoms as seen with acei.   This has not been reported with exposure to the other ARB's to date, may need to try either generic diovan or avapro if ARB needed or use an alternative class altogether but for now hold off on any changes   See:  Ann Allergy Asthma Immunol  2008: 101: p [phone]      Total time devoted to counseling  > 50 % of initial 60 min office visit:  review case with pt/husband discussion of options/alternatives/ personally creating written customized instructions  in presence of pt  then going over those specific  Instructions directly with the pt including how to use all of the meds but in particular covering each new medication in detail and the difference between the maintenance= "automatic" meds and the prns  using an action plan format for the latter (If this problem/symptom => do that organization reading Left to right).  Please see AVS from this visit for a full list of these instructions which I personally wrote for this pt and  are unique to this visit.   See device teaching which extended face to face time for this visit

## 2018-04-16 DIAGNOSIS — L03116 Cellulitis of left lower limb: Secondary | ICD-10-CM | POA: Diagnosis not present

## 2018-04-16 DIAGNOSIS — Z23 Encounter for immunization: Secondary | ICD-10-CM | POA: Diagnosis not present

## 2018-04-16 DIAGNOSIS — Z6835 Body mass index (BMI) 35.0-35.9, adult: Secondary | ICD-10-CM | POA: Diagnosis not present

## 2018-04-16 DIAGNOSIS — E6609 Other obesity due to excess calories: Secondary | ICD-10-CM | POA: Diagnosis not present

## 2018-04-17 ENCOUNTER — Encounter (HOSPITAL_COMMUNITY): Payer: Self-pay | Admitting: Physical Therapy

## 2018-04-17 DIAGNOSIS — Z6835 Body mass index (BMI) 35.0-35.9, adult: Secondary | ICD-10-CM | POA: Diagnosis not present

## 2018-04-17 DIAGNOSIS — E6609 Other obesity due to excess calories: Secondary | ICD-10-CM | POA: Diagnosis not present

## 2018-04-17 DIAGNOSIS — L03119 Cellulitis of unspecified part of limb: Secondary | ICD-10-CM | POA: Diagnosis not present

## 2018-04-19 ENCOUNTER — Encounter (HOSPITAL_COMMUNITY): Payer: Self-pay | Admitting: Physical Therapy

## 2018-04-19 ENCOUNTER — Ambulatory Visit (HOSPITAL_COMMUNITY): Payer: Medicare Other | Attending: Family Medicine | Admitting: Physical Therapy

## 2018-04-19 ENCOUNTER — Other Ambulatory Visit: Payer: Self-pay

## 2018-04-19 ENCOUNTER — Telehealth (HOSPITAL_COMMUNITY): Payer: Self-pay | Admitting: Family Medicine

## 2018-04-19 DIAGNOSIS — S91002S Unspecified open wound, left ankle, sequela: Secondary | ICD-10-CM | POA: Diagnosis not present

## 2018-04-19 DIAGNOSIS — M25572 Pain in left ankle and joints of left foot: Secondary | ICD-10-CM | POA: Diagnosis not present

## 2018-04-19 DIAGNOSIS — R262 Difficulty in walking, not elsewhere classified: Secondary | ICD-10-CM | POA: Insufficient documentation

## 2018-04-19 DIAGNOSIS — M25662 Stiffness of left knee, not elsewhere classified: Secondary | ICD-10-CM | POA: Insufficient documentation

## 2018-04-19 DIAGNOSIS — M25562 Pain in left knee: Secondary | ICD-10-CM | POA: Diagnosis not present

## 2018-04-19 NOTE — Therapy (Signed)
[place], [place], [address] Phone: [phone]   Fax:  [phone]  Wound Care Evaluation  Patient Details  Name: Caitlin [name] MRN: [id number] Date of Birth: [birth year] Referring Provider: Sharilyn [name]    Encounter Date: 04/19/2018  PT End of Session - 04/19/18 1159    Visit Number  1    Number of Visits  12    Date for PT Re-Evaluation  05/31/18    PT Start Time  0945    PT Stop Time  1035    PT Time Calculation (min)  50 min    Activity Tolerance  Patient tolerated treatment well    Behavior During Therapy  [facility] for tasks assessed/performed       Past Medical History:  Diagnosis Date  . Arthritis   . Asthma   . Breast cancer ([place])   . Breast disorder    cancer  . Breast mass    benign  . GERD (gastroesophageal reflux disease)   . Heart murmur    AS CHILD  OUTGREW IT  . History of hiatal hernia   . Hyperlipidemia   . Hypertension   . Impaired fasting glucose   . Rectocele   . Varicose vein of leg     Past Surgical History:  Procedure Laterality Date  . BREAST LUMPECTOMY WITH RADIOACTIVE SEED AND SENTINEL LYMPH NODE BIOPSY Right 11/11/2015   Procedure: RIGHT BREAST RADIOACTIVE SEED GUIDED  LUMPECTOMY AND RADIOACTIVE SEED TARGETED SENTINEL LYMPH NODE BIOPSY;  Surgeon: Caitlin Klein, MD;  Location: [facility];  Service: General;  Laterality: Right;  . BREAST SURGERY Left    breast biopies x4  . CHOLECYSTECTOMY  09/08/2010  . COLONOSCOPY    . COLONOSCOPY N/A 09/25/2013   Procedure: COLONOSCOPY;  Surgeon: Caitlin Houston, MD;  [facility];  Service: Endoscopy;  Laterality: N/A;  100-moved to 1200 Ann to notify pt  . HERNIA REPAIR  12/24/2010, 12-2014   TOTAL 4  . LAPAROSCOPIC GASTRIC SLEEVE RESECTION  11-2013   at [place]  2007/2008  . TONSILLECTOMY  1968  . TOTAL KNEE ARTHROPLASTY Left 05/02/2017  . TOTAL KNEE ARTHROPLASTY Left  05/02/2017   Procedure: LEFT TOTAL KNEE ARTHROPLASTY;  Surgeon: Caitlin Balding, MD;  Location: [facility];  Service: Orthopedics;  Laterality: Left;  . UPPER GASTROINTESTINAL ENDOSCOPY    . VAGINAL HYSTERECTOMY  1980s    There were no vitals filed for this visit.    [facility] PT Assessment - 04/19/18 0001      Assessment   Medical Diagnosis  non healing Lt ankle wound    Referring Provider  Caitlin [name]     Onset Date/Surgical Date  04/05/18    Next MD Visit  not scheduled    Prior Therapy  none      Precautions   Precautions  --   cellulitis     Restrictions   Weight Bearing Restrictions  No      Balance Screen   Has the patient fallen in the past 6 months  No    Has the patient had a decrease in activity level because of a fear of falling?   Yes    Is the patient reluctant to leave their home because of a fear of falling?   No      Cognition   Overall Cognitive Status  Within Functional Limits for tasks assessed  Wound Therapy - 04/19/18 1138    Subjective  Pt states that she knows that her veins are bad; that is the only thing she knows that might be causing her wound as she did not fall or hit her leg on anything.  States that it began by her ankle swelling, turning red and being very painful.  She went to the ER and was given prednisone and medication for gout but this did not help therefore she went back and was diagnosed with cellulitis and given antibiotics which she is still taking.  It wasn't until a few days later that the wound appeared on the inside of her ankle.  She is in constant pain which is relieved somewhat by having her leg elevated.      Patient and Family Stated Goals  wound to heal and pain to be gone     Date of Onset  04/05/18    Prior Treatments  medication and self care.     Pain Scale  0-10    Pain Score  4    will go as high as a 9/10 when she has been up on her feet   Pain Type  Acute pain    Pain Location  Ankle    Pain Orientation   Left;Medial    Pain Descriptors / Indicators  Aching    Pain Onset  Gradual    Patients Stated Pain Goal  0    Pain Intervention(s)  --   compression    Evaluation and Treatment Procedures Explained to Patient/Family  Yes    Evaluation and Treatment Procedures  agreed to    Wound Properties Date First Assessed: 04/19/18 Time First Assessed: 0955 Wound Type: Venous stasis ulcer Location: Ankle Location Orientation: Left;Medial Present on Admission: Yes   Dressing Type  Gauze (Comment)    Dressing Changed  Changed    Dressing Status  Old drainage    Dressing Change Frequency  PRN    Site / Wound Assessment  Painful;Yellow    % Wound base Red or Granulating  0%   in open area   % Wound base Yellow/Fibrinous Exudate  100%    Peri-wound Assessment  Edema;Erythema (blanchable);Hemosiderin    Wound Length (cm)  4 cm   open area within is 1.2x 3 with ? depth   Wound Width (cm)  3 cm    Wound Surface Area (cm^2)  12 cm^2    Margins  Attached edges (approximated)    Drainage Amount  Minimal    Drainage Description  Sanguineous    Treatment  Cleansed;Debridement (Selective);Other (Comment)    Selective Debridement - Location  wound bed     Selective Debridement - Tools Used  Forceps;Scissors    Selective Debridement - Tissue Removed  slough/ devitalized tissue     Wound Therapy - Clinical Statement  Caitlin [name] is a 72 yo female who has most likely developed a CVI ulcer on the medial aspect of her Left ankle.  She has increased heat, redness and edema which is 13 cm in length at her distal leg which begins at the midline anteriorly and wraps medially to the posterior aspect.  There are two areas along the medial aspect inside this redness one is a tissue color change of 4x3 cm where the wound will most likely extend to and the other is the actual opened wound which inside the 4x3 area and measures 1.2x 2 cm.  Of not there is a very small open area just  distal to this which the therapist did not  measure.  Caitlin [name]'s wound was cleansed, debrided and dressed with medihoney, 2x2 and gauze followed by application of a compression dressing.        Wound Therapy - Functional Problem List  Pain when walking     Factors Delaying/Impairing Wound Healing  Infection - systemic/local;Vascular compromise    Hydrotherapy Plan  Debridement;Dressing change;Patient/family education    Wound Therapy - Frequency  3X / week   3x a week for the first week followed by 2x a week x 5 weeks   Wound Therapy - Current Recommendations  PT    Wound Plan  Assess how pt tolerated compression dressing.  Cleanse and debride wound followed by application of compression.  If compression was not tolerated try profore lite.      Dressing   medihoney, 2x2, kling and profore.              Objective measurements completed on examination: See above findings.            PT Education - 04/19/18 1157    Education Details  not to get dressing wet, to take first layer off if dressing becomes painful and the need to wear her compression daily once wound has healed.     Person(s) Educated  Patient    Methods  Explanation    Comprehension  Verbalized understanding       PT Short Term Goals - 04/19/18 1202      PT SHORT TERM GOAL #1   Title  Pt pain to be no greater than a 4/10 to allow pt to be up for over an hour in comfort to complete household duties.     Time  3    Period  Weeks    Status  New    Target Date  05/10/18      PT SHORT TERM GOAL #2   Title  Pt wound to be 100% granulated to decrease infection     Time  3    Period  Weeks    Status  New      PT SHORT TERM GOAL #3   Title  PT wound to to be approximated to 2x1.5 cm    Time  3    Period  Weeks    Status  New        PT Long Term Goals - 04/19/18 1204      PT LONG TERM GOAL #1   Title  PT to have no pain in her left ankle to be able to be up for over 2 hours at a time to shop     Time  6    Period  Weeks    Status  New     Target Date  05/31/18      PT LONG TERM GOAL #2   Title  Wound to be healed    Time  6    Period  Weeks    Status  New      PT LONG TERM GOAL #3   Title  PT to be wearing compression garment everyday.     Time  6    Period  Weeks    Status  New           Plan - 04/19/18 1200    Clinical Impression Statement  as above    Clinical Presentation  Evolving    Clinical Decision Making  Low    Rehab Potential  Good    PT Frequency  3x / week    PT Duration  --   1 week followed by 2x a wk for 5 weeks    PT Treatment/Interventions  Other (comment);Compression bandaging   debridement, compression bandaging    PT Next Visit Plan  see above        Patient will benefit from skilled therapeutic intervention in order to improve the following deficits and impairments:  Increased edema, Difficulty walking, Decreased skin integrity, Pain  Visit Diagnosis: Pain in left ankle and joints of left foot  Difficulty in walking, not elsewhere classified  Ankle wound, left, sequela    Problem List Patient Active Problem List   Diagnosis Date Noted  . Unilateral primary osteoarthritis, left knee 05/02/2017  . S/P total knee replacement using cement, left 05/02/2017  . Primary osteoarthritis of left knee 12/21/2016  . Chondromalacia patellae, left knee 05/26/2016  . Breast cancer of upper-inner quadrant of right female breast ([place]) 11/04/2015  . GERD (gastroesophageal reflux disease) 09/02/2013  . Obesity 09/02/2013  . Ventral hernia, recurrent 09/02/2013  . Unspecified asthma(493.90) 12/12/2012  . Cough variant asthma 03/27/2012  . Right middle lobe syndrome 03/27/2012   Rayetta Humphrey, PT CLT [phone] 04/19/2018, 12:08 PM  [place] [address] [place], [place], [address] Phone: [phone]   Fax:  [phone]  Name: Caitlin [name] MRN: [id number] Date of Birth: 14-Nov-1945

## 2018-04-19 NOTE — Telephone Encounter (Signed)
04/19/18  I left patient a message to come in on 9/24 and also let her know that would still watch the schedule to see if anything comes available on 9/23 since she needs to be 3x week.   11:11 patient called back and said that she would be here on 9/24

## 2018-04-23 ENCOUNTER — Ambulatory Visit (HOSPITAL_COMMUNITY): Payer: Medicare Other | Admitting: Physical Therapy

## 2018-04-23 DIAGNOSIS — M25572 Pain in left ankle and joints of left foot: Secondary | ICD-10-CM

## 2018-04-23 DIAGNOSIS — M25562 Pain in left knee: Secondary | ICD-10-CM

## 2018-04-23 DIAGNOSIS — S91002S Unspecified open wound, left ankle, sequela: Secondary | ICD-10-CM

## 2018-04-23 DIAGNOSIS — R262 Difficulty in walking, not elsewhere classified: Secondary | ICD-10-CM

## 2018-04-23 DIAGNOSIS — M25662 Stiffness of left knee, not elsewhere classified: Secondary | ICD-10-CM

## 2018-04-23 NOTE — Therapy (Signed)
[place], [place], [address] Phone: [phone]   Fax:  [phone]  Wound Care Therapy  Patient Details  Name: Caitlin [name] MRN: [id number] Date of Birth: 09/15/1945 Referring Provider: Sharilyn Sites    Encounter Date: 04/23/2018  PT End of Session - 04/23/18 1111    Visit Number  3    Number of Visits  12    Date for PT Re-Evaluation  05/31/18    PT Start Time  1030    PT Stop Time  1100    PT Time Calculation (min)  30 min    Activity Tolerance  Patient tolerated treatment well    Behavior During Therapy  [facility] for tasks assessed/performed       Past Medical History:  Diagnosis Date  . Arthritis   . Asthma   . Breast cancer ([place])   . Breast disorder    cancer  . Breast mass    benign  . GERD (gastroesophageal reflux disease)   . Heart murmur    AS CHILD  OUTGREW IT  . History of hiatal hernia   . Hyperlipidemia   . Hypertension   . Impaired fasting glucose   . Rectocele   . Varicose vein of leg     Past Surgical History:  Procedure Laterality Date  . BREAST LUMPECTOMY WITH RADIOACTIVE SEED AND SENTINEL LYMPH NODE BIOPSY Right 11/11/2015   Procedure: RIGHT BREAST RADIOACTIVE SEED GUIDED  LUMPECTOMY AND RADIOACTIVE SEED TARGETED SENTINEL LYMPH NODE BIOPSY;  Surgeon: Stark Klein, MD;  Location: [facility];  Service: General;  Laterality: Right;  . BREAST SURGERY Left    breast biopies x4  . CHOLECYSTECTOMY  09/08/2010  . COLONOSCOPY    . COLONOSCOPY N/A 09/25/2013   Procedure: COLONOSCOPY;  Surgeon: Rogene Houston, MD;  [facility];  Service: Endoscopy;  Laterality: N/A;  100-moved to 1200 Ann to notify pt  . HERNIA REPAIR  12/24/2010, 12-2014   TOTAL 4  . LAPAROSCOPIC GASTRIC SLEEVE RESECTION  11-2013   at [facility]  2007/2008  . TONSILLECTOMY  1968  . TOTAL KNEE ARTHROPLASTY Left 05/02/2017  . TOTAL KNEE ARTHROPLASTY Left 05/02/2017    Procedure: LEFT TOTAL KNEE ARTHROPLASTY;  Surgeon: [name], MD;  Location: [facility];  Service: Orthopedics;  Laterality: Left;  . UPPER GASTROINTESTINAL ENDOSCOPY    . VAGINAL HYSTERECTOMY  1980s    There were no vitals filed for this visit.              Wound Therapy - 04/23/18 1106    Subjective  Pt states she had to remove the top layer (coban) yesterday because it was irritating and her skin was itching.    Patient and Family Stated Goals  wound to heal and pain to be gone     Date of Onset  04/05/18    Prior Treatments  medication and self care.     Pain Scale  0-10    Pain Score  0-No pain    Evaluation and Treatment Procedures Explained to Patient/Family  Yes    Evaluation and Treatment Procedures  agreed to    Wound Properties Date First Assessed: 04/19/18 Time First Assessed: 0955 Wound Type: Venous stasis ulcer Location: Ankle Location Orientation: Left;Medial Present on Admission: Yes   Dressing Type  Gauze (Comment)    Dressing Changed  Changed    Dressing Status  Old drainage  Dressing Change Frequency  PRN    Site / Wound Assessment  Painful;Yellow    % Wound base Red or Granulating  5%   in open area   % Wound base Yellow/Fibrinous Exudate  95%    Peri-wound Assessment  Erythema (blanchable);Hemosiderin    Margins  Attached edges (approximated)    Drainage Amount  Minimal    Drainage Description  Sanguineous    Treatment  Cleansed;Debridement (Selective)    Selective Debridement - Location  wound bed and perimeter    Selective Debridement - Tools Used  Forceps    Selective Debridement - Tissue Removed  slough/ devitalized tissue     Wound Therapy - Clinical Statement  area around the wound raw with peeling skin and white calloused area below wound.  Debrided wound borders with continued adheernt slough.  Cleansed and moisturized LE well prior to rebandaging.  Continued wiht medihoney to small wound and used xeroform perimeter of wound.  Profore  used for compression.    Wound Therapy - Functional Problem List  Pain when walking     Factors Delaying/Impairing Wound Healing  Infection - systemic/local;Vascular compromise    Hydrotherapy Plan  Debridement;Dressing change;Patient/family education    Wound Therapy - Frequency  3X / week   3x a week for the first week followed by 2x a week x 5 weeks   Wound Therapy - Current Recommendations  PT    Wound Plan  Cleanse and debride wound followed by application of compression.  Measure weekly.     Dressing   medihoney to wound, xeroform to perimeter, 2x2, kling and profore.                 PT Short Term Goals - 04/19/18 1202      PT SHORT TERM GOAL #1   Title  Pt pain to be no greater than a 4/10 to allow pt to be up for over an hour in comfort to complete household duties.     Time  3    Period  Weeks    Status  New    Target Date  05/10/18      PT SHORT TERM GOAL #2   Title  Pt wound to be 100% granulated to decrease infection     Time  3    Period  Weeks    Status  New      PT SHORT TERM GOAL #3   Title  PT wound to to be approximated to 2x1.5 cm    Time  3    Period  Weeks    Status  New        PT Long Term Goals - 04/19/18 1204      PT LONG TERM GOAL #1   Title  PT to have no pain in her left ankle to be able to be up for over 2 hours at a time to shop     Time  6    Period  Weeks    Status  New    Target Date  05/31/18      PT LONG TERM GOAL #2   Title  Wound to be healed    Time  6    Period  Weeks    Status  New      PT LONG TERM GOAL #3   Title  PT to be wearing compression garment everyday.     Time  6    Period  Weeks    Status  New              Patient will benefit from skilled therapeutic intervention in order to improve the following deficits and impairments:     Visit Diagnosis: Pain in left ankle and joints of left foot  Difficulty in walking, not elsewhere classified  Ankle wound, left, sequela  Acute pain of left  knee  Stiffness of left knee, not elsewhere classified     Problem List Patient Active Problem List   Diagnosis Date Noted  . Unilateral primary osteoarthritis, left knee 05/02/2017  . S/P total knee replacement using cement, left 05/02/2017  . Primary osteoarthritis of left knee 12/21/2016  . Chondromalacia patellae, left knee 05/26/2016  . Breast cancer of upper-inner quadrant of right female breast ([place]) 11/04/2015  . GERD (gastroesophageal reflux disease) 09/02/2013  . Obesity 09/02/2013  . Ventral hernia, recurrent 09/02/2013  . Unspecified asthma(493.90) 12/12/2012  . Cough variant asthma 03/27/2012  . Right middle lobe syndrome 03/27/2012   Teena Irani, PTA/CLT [phone]  Teena Irani 04/23/2018, 11:12 AM  [place], [place], [address] Phone: [phone]   Fax:  [phone]  Name: LUCEIL HERRIN MRN: [id number] Date of Birth: [birth year]

## 2018-04-24 ENCOUNTER — Ambulatory Visit (HOSPITAL_COMMUNITY): Payer: Medicare Other | Admitting: Physical Therapy

## 2018-04-25 ENCOUNTER — Ambulatory Visit (HOSPITAL_COMMUNITY): Payer: Medicare Other | Admitting: Physical Therapy

## 2018-04-25 DIAGNOSIS — R262 Difficulty in walking, not elsewhere classified: Secondary | ICD-10-CM

## 2018-04-25 DIAGNOSIS — M25662 Stiffness of left knee, not elsewhere classified: Secondary | ICD-10-CM | POA: Diagnosis not present

## 2018-04-25 DIAGNOSIS — S91002S Unspecified open wound, left ankle, sequela: Secondary | ICD-10-CM | POA: Diagnosis not present

## 2018-04-25 DIAGNOSIS — M25572 Pain in left ankle and joints of left foot: Secondary | ICD-10-CM

## 2018-04-25 DIAGNOSIS — M25562 Pain in left knee: Secondary | ICD-10-CM | POA: Diagnosis not present

## 2018-04-25 NOTE — Therapy (Signed)
[place], [place], [address] Phone: [phone]   Fax:  [phone]  Wound Care Therapy  Patient Details  Name: Caitlin [name] MRN: [id number] Date of Birth: 12/19/1945 Referring Provider: Sharilyn Sites    Encounter Date: 04/25/2018    Past Medical History:  Diagnosis Date  . Arthritis   . Asthma   . Breast cancer ([place])   . Breast disorder    cancer  . Breast mass    benign  . GERD (gastroesophageal reflux disease)   . Heart murmur    AS CHILD  OUTGREW IT  . History of hiatal hernia   . Hyperlipidemia   . Hypertension   . Impaired fasting glucose   . Rectocele   . Varicose vein of leg     Past Surgical History:  Procedure Laterality Date  . BREAST LUMPECTOMY WITH RADIOACTIVE SEED AND SENTINEL LYMPH NODE BIOPSY Right 11/11/2015   Procedure: RIGHT BREAST RADIOACTIVE SEED GUIDED  LUMPECTOMY AND RADIOACTIVE SEED TARGETED SENTINEL LYMPH NODE BIOPSY;  Surgeon: Stark Klein, MD;  Location: [facility];  Service: General;  Laterality: Right;  . BREAST SURGERY Left    breast biopies x4  . CHOLECYSTECTOMY  09/08/2010  . COLONOSCOPY    . COLONOSCOPY N/A 09/25/2013   Procedure: COLONOSCOPY;  Surgeon: Rogene Houston, MD;  [facility];  Service: Endoscopy;  Laterality: N/A;  100-moved to 1200 Ann to notify pt  . HERNIA REPAIR  12/24/2010, 12-2014   TOTAL 4  . LAPAROSCOPIC GASTRIC SLEEVE RESECTION  11-2013   at [facility]  2007/2008  . TONSILLECTOMY  1968  . TOTAL KNEE ARTHROPLASTY Left 05/02/2017  . TOTAL KNEE ARTHROPLASTY Left 05/02/2017   Procedure: LEFT TOTAL KNEE ARTHROPLASTY;  Surgeon: [name], MD;  Location: [facility];  Service: Orthopedics;  Laterality: Left;  . UPPER GASTROINTESTINAL ENDOSCOPY    . VAGINAL HYSTERECTOMY  1980s    There were no vitals filed for this visit.              Wound Therapy - 04/25/18 1121    Subjective  Pt states the itching is the worst part.  STates she does not have any pain, however ran a fever last last but nothing since taking tylenol.    Patient and Family Stated Goals  wound to heal and pain to be gone     Date of Onset  04/05/18    Prior Treatments  medication and self care.     Pain Scale  0-10    Pain Score  0-No pain    Evaluation and Treatment Procedures Explained to Patient/Family  Yes    Evaluation and Treatment Procedures  agreed to    Wound Properties Date First Assessed: 04/19/18 Time First Assessed: 0955 Wound Type: Venous stasis ulcer Location: Ankle Location Orientation: Left;Medial Present on Admission: Yes   Dressing Type  Gauze (Comment)    Dressing Changed  Changed    Dressing Status  Old drainage    Dressing Change Frequency  PRN    Site / Wound Assessment  Painful;Yellow    % Wound base Red or Granulating  10%   in open area   % Wound base Yellow/Fibrinous Exudate  90%    Peri-wound Assessment  Erythema (blanchable);Hemosiderin    Wound Length (cm)  3 cm    Wound Width (cm)  2 cm    Wound Surface Area (cm^2)  6 cm^2    Margins  Attached edges (approximated)    Drainage Amount  Minimal    Drainage Description  Sanguineous    Treatment  Cleansed;Debridement (Selective)    Selective Debridement - Location  wound bed and perimeter    Selective Debridement - Tools Used  Forceps    Selective Debridement - Tissue Removed  slough/ devitalized tissue     Wound Therapy - Clinical Statement  area around contiues to be raw, however less so than last session.  Calloused/dry area inferior to wound and skin peeling down at medial heel.  Area around wound also red but no redder than last session and no heat.  Wound itself still with thick stuck on slough, however edges debrided well.  Moisturized well with lotion and vaseline following cleansing.  Educated to seek medical attention if fever returns, persists or has any other symptoms of infection.     Wound  Therapy - Functional Problem List  Pain when walking     Factors Delaying/Impairing Wound Healing  Infection - systemic/local;Vascular compromise    Hydrotherapy Plan  Debridement;Dressing change;Patient/family education    Wound Therapy - Frequency  3X / week   3x a week for the first week followed by 2x a week x 5 weeks   Wound Therapy - Current Recommendations  PT    Wound Plan  Cleanse and debride wound followed by application of compression.  Measure weekly.     Dressing   medihoney to wound, xeroform to perimeter, 2x2, kling and profore.               PT Education - 04/25/18 1128    Education Details  Notify MD or seek medical attention if any further signs of infection (fever, heat, nausea, etc)    Person(s) Educated  Patient    Methods  Explanation    Comprehension  Verbalized understanding       PT Short Term Goals - 04/19/18 1202      PT SHORT TERM GOAL #1   Title  Pt pain to be no greater than a 4/10 to allow pt to be up for over an hour in comfort to complete household duties.     Time  3    Period  Weeks    Status  New    Target Date  05/10/18      PT SHORT TERM GOAL #2   Title  Pt wound to be 100% granulated to decrease infection     Time  3    Period  Weeks    Status  New      PT SHORT TERM GOAL #3   Title  PT wound to to be approximated to 2x1.5 cm    Time  3    Period  Weeks    Status  New        PT Long Term Goals - 04/19/18 1204      PT LONG TERM GOAL #1   Title  PT to have no pain in her left ankle to be able to be up for over 2 hours at a time to shop     Time  6    Period  Weeks    Status  New    Target Date  05/31/18      PT LONG TERM GOAL #2   Title  Wound to be healed    Time  6    Period  Weeks    Status  New  PT LONG TERM GOAL #3   Title  PT to be wearing compression garment everyday.     Time  6    Period  Weeks    Status  New              Patient will benefit from skilled therapeutic intervention in order  to improve the following deficits and impairments:     Visit Diagnosis: Pain in left ankle and joints of left foot  Difficulty in walking, not elsewhere classified  Ankle wound, left, sequela     Problem List Patient Active Problem List   Diagnosis Date Noted  . Unilateral primary osteoarthritis, left knee 05/02/2017  . S/P total knee replacement using cement, left 05/02/2017  . Primary osteoarthritis of left knee 12/21/2016  . Chondromalacia patellae, left knee 05/26/2016  . Breast cancer of upper-inner quadrant of right female breast ([place]) 11/04/2015  . GERD (gastroesophageal reflux disease) 09/02/2013  . Obesity 09/02/2013  . Ventral hernia, recurrent 09/02/2013  . Unspecified asthma(493.90) 12/12/2012  . Cough variant asthma 03/27/2012  . Right middle lobe syndrome 03/27/2012   Teena Irani, PTA/CLT [phone]  Teena Irani 04/25/2018, 11:29 AM  [place], [place], [address] Phone: [phone]   Fax:  [phone]  Name: KENNADEE WALTHOUR MRN: [id number] Date of Birth: [birth year]

## 2018-04-27 ENCOUNTER — Ambulatory Visit (HOSPITAL_COMMUNITY): Payer: Medicare Other

## 2018-04-27 ENCOUNTER — Other Ambulatory Visit: Payer: Self-pay

## 2018-04-27 ENCOUNTER — Encounter (HOSPITAL_COMMUNITY): Payer: Self-pay

## 2018-04-27 DIAGNOSIS — R262 Difficulty in walking, not elsewhere classified: Secondary | ICD-10-CM

## 2018-04-27 DIAGNOSIS — M25662 Stiffness of left knee, not elsewhere classified: Secondary | ICD-10-CM | POA: Diagnosis not present

## 2018-04-27 DIAGNOSIS — S91002S Unspecified open wound, left ankle, sequela: Secondary | ICD-10-CM

## 2018-04-27 DIAGNOSIS — M25572 Pain in left ankle and joints of left foot: Secondary | ICD-10-CM

## 2018-04-27 DIAGNOSIS — M25562 Pain in left knee: Secondary | ICD-10-CM | POA: Diagnosis not present

## 2018-04-27 NOTE — Therapy (Signed)
[place], [place], [address] Phone: [phone]   Fax:  [phone]  Wound Care Therapy  Patient Details  Name: Caitlin [name] MRN: [id number] Date of Birth: 1946-01-16 Referring Provider (PT): Sharilyn Sites    Encounter Date: 04/27/2018  PT End of Session - 04/27/18 1219    Visit Number  4    Number of Visits  12    Date for PT Re-Evaluation  05/31/18    Authorization - Visit Number  4    Authorization - Number of Visits  10    PT Start Time  0904    PT Stop Time  0950    PT Time Calculation (min)  46 min    Activity Tolerance  Patient tolerated treatment well    Behavior During Therapy  [facility] for tasks assessed/performed       Past Medical History:  Diagnosis Date  . Arthritis   . Asthma   . Breast cancer ([place])   . Breast disorder    cancer  . Breast mass    benign  . GERD (gastroesophageal reflux disease)   . Heart murmur    AS CHILD  OUTGREW IT  . History of hiatal hernia   . Hyperlipidemia   . Hypertension   . Impaired fasting glucose   . Rectocele   . Varicose vein of leg     Past Surgical History:  Procedure Laterality Date  . BREAST LUMPECTOMY WITH RADIOACTIVE SEED AND SENTINEL LYMPH NODE BIOPSY Right 11/11/2015   Procedure: RIGHT BREAST RADIOACTIVE SEED GUIDED  LUMPECTOMY AND RADIOACTIVE SEED TARGETED SENTINEL LYMPH NODE BIOPSY;  Surgeon: Stark Klein, MD;  Location: [place];  Service: General;  Laterality: Right;  . BREAST SURGERY Left    breast biopies x4  . CHOLECYSTECTOMY  09/08/2010  . COLONOSCOPY    . COLONOSCOPY N/A 09/25/2013   Procedure: COLONOSCOPY;  Surgeon: Rogene Houston, MD;  [facility];  Service: Endoscopy;  Laterality: N/A;  100-moved to 1200 Ann to notify pt  . HERNIA REPAIR  12/24/2010, 12-2014   TOTAL 4  . LAPAROSCOPIC GASTRIC SLEEVE RESECTION  11-2013   at [place]  2007/2008  . TONSILLECTOMY  1968  .  TOTAL KNEE ARTHROPLASTY Left 05/02/2017  . TOTAL KNEE ARTHROPLASTY Left 05/02/2017   Procedure: LEFT TOTAL KNEE ARTHROPLASTY;  Surgeon: [name], MD;  Location: [place];  Service: Orthopedics;  Laterality: Left;  . UPPER GASTROINTESTINAL ENDOSCOPY    . VAGINAL HYSTERECTOMY  1980s    There were no vitals filed for this visit.    Wound Therapy - 04/27/18 1220    Subjective  Patient reports she has ongoing pain and itching of her leg. She denies any ongoing fever.     Patient and Family Stated Goals  wound to heal and pain to be gone     Date of Onset  04/05/18    Prior Treatments  medication and self care.     Pain Scale  0-10    Pain Score  3     Pain Type  Acute pain    Pain Location  Ankle    Pain Orientation  Left;Medial    Pain Descriptors / Indicators  Aching    Pain Onset  Gradual    Patients Stated Pain Goal  0    Evaluation and Treatment Procedures Explained to Patient/Family  Yes    Evaluation and  Treatment Procedures  agreed to    Wound Properties Date First Assessed: 04/19/18 Time First Assessed: 0955 Wound Type: Venous stasis ulcer Location: Ankle Location Orientation: Left;Medial Present on Admission: Yes   Dressing Type  Gauze (Comment);Impregnated gauze (bismuth);Abdominal pads;Compression wrap    Dressing Changed  Changed    Dressing Status  Old drainage    Dressing Change Frequency  PRN    Site / Wound Assessment  Painful;Yellow    % Wound base Red or Granulating  10%   in open area   % Wound base Yellow/Fibrinous Exudate  90%    Peri-wound Assessment  Erythema (blanchable);Hemosiderin    Margins  Epibole (rolled edges)    Drainage Amount  Minimal    Drainage Description  Sanguineous    Treatment  Cleansed;Debridement (Selective)    Selective Debridement - Location  wound bed and perimeter    Selective Debridement - Tools Used  Forceps    Selective Debridement - Tissue Removed  slough/ devitalized tissue     Wound Therapy - Clinical Statement   Peri-wound had dry yellow slough present today and raw skin present underneath. It appears to be well healing and continued with xeroform and some vaseline around perimeter to reduce dryness. Wound bed continues to have thick adherent slough. Edges were able to be be debrided with forceps but remain tender and sensitive, edges appear epiboled and depth of wound is unknown due to tough adhered slough. Continued with medihoney to wound bed and profore wrapping to prevent LE edema. Reviewed signs/symptoms of systemic infection with patient again and educated on when to seek medical attention. She will continue to benefit from skilled wound care to promote healing environment.     Wound Therapy - Functional Problem List  Pain when walking     Factors Delaying/Impairing Wound Healing  Infection - systemic/local;Vascular compromise    Hydrotherapy Plan  Debridement;Dressing change;Patient/family education    Wound Therapy - Frequency  3X / week   3x a week for the first week followed by 2x a week x 5 weeks   Wound Therapy - Current Recommendations  PT    Wound Plan  Cleanse and debride wound followed by application of compression.  Measure weekly.     Dressing   medihoney to wound, xeroform to perimeter, 2x2, profore.         PT Education - 04/27/18 1217    Education Details  Reviewed signs/symptoms of systemic infection and educated to call MD and seek medical attention if she experiences them. (fever, warmth around wound, nausea, vomitting, diarrhea, etc)    Person(s) Educated  Patient    Methods  Explanation    Comprehension  Verbalized understanding       PT Short Term Goals - 04/19/18 1202      PT SHORT TERM GOAL #1   Title  Pt pain to be no greater than a 4/10 to allow pt to be up for over an hour in comfort to complete household duties.     Time  3    Period  Weeks    Status  New    Target Date  05/10/18      PT SHORT TERM GOAL #2   Title  Pt wound to be 100% granulated to decrease  infection     Time  3    Period  Weeks    Status  New      PT SHORT TERM GOAL #3   Title  PT wound to to  be approximated to 2x1.5 cm    Time  3    Period  Weeks    Status  New        PT Long Term Goals - 04/19/18 1204      PT LONG TERM GOAL #1   Title  PT to have no pain in her left ankle to be able to be up for over 2 hours at a time to shop     Time  6    Period  Weeks    Status  New    Target Date  05/31/18      PT LONG TERM GOAL #2   Title  Wound to be healed    Time  6    Period  Weeks    Status  New      PT LONG TERM GOAL #3   Title  PT to be wearing compression garment everyday.     Time  6    Period  Weeks    Status  New        Plan - 04/27/18 1219    Clinical Impression Statement  see above    Rehab Potential  Good    PT Frequency  3x / week    PT Duration  --   1 week followed by 2x a wk for 5 weeks    PT Treatment/Interventions  Other (comment);Compression bandaging   debridement, compression bandaging    PT Next Visit Plan  see above     Consulted and Agree with Plan of Care  Patient       Patient will benefit from skilled therapeutic intervention in order to improve the following deficits and impairments:  Increased edema, Difficulty walking, Decreased skin integrity, Pain  Visit Diagnosis: Pain in left ankle and joints of left foot  Difficulty in walking, not elsewhere classified  Ankle wound, left, sequela     Problem List Patient Active Problem List   Diagnosis Date Noted  . Unilateral primary osteoarthritis, left knee 05/02/2017  . S/P total knee replacement using cement, left 05/02/2017  . Primary osteoarthritis of left knee 12/21/2016  . Chondromalacia patellae, left knee 05/26/2016  . Breast cancer of upper-inner quadrant of right female breast ([place]) 11/04/2015  . GERD (gastroesophageal reflux disease) 09/02/2013  . Obesity 09/02/2013  . Ventral hernia, recurrent 09/02/2013  . Unspecified asthma(493.90) 12/12/2012  . Cough  variant asthma 03/27/2012  . Right middle lobe syndrome 03/27/2012    Kipp Brood, PT, DPT Physical Therapist with [facility]  04/27/2018 12:31 PM    [place] [address] [place], [place], [address] Phone: [phone]   Fax:  [phone]  Name: Caitlin [name] MRN: [id number] Date of Birth: [birth year]

## 2018-04-30 ENCOUNTER — Ambulatory Visit (HOSPITAL_COMMUNITY): Payer: Medicare Other

## 2018-04-30 ENCOUNTER — Encounter

## 2018-04-30 ENCOUNTER — Other Ambulatory Visit: Payer: Self-pay

## 2018-04-30 ENCOUNTER — Encounter (HOSPITAL_COMMUNITY): Payer: Self-pay

## 2018-04-30 DIAGNOSIS — M25572 Pain in left ankle and joints of left foot: Secondary | ICD-10-CM | POA: Diagnosis not present

## 2018-04-30 DIAGNOSIS — E6609 Other obesity due to excess calories: Secondary | ICD-10-CM | POA: Diagnosis not present

## 2018-04-30 DIAGNOSIS — R262 Difficulty in walking, not elsewhere classified: Secondary | ICD-10-CM | POA: Diagnosis not present

## 2018-04-30 DIAGNOSIS — M25662 Stiffness of left knee, not elsewhere classified: Secondary | ICD-10-CM | POA: Diagnosis not present

## 2018-04-30 DIAGNOSIS — S91002S Unspecified open wound, left ankle, sequela: Secondary | ICD-10-CM

## 2018-04-30 DIAGNOSIS — Z6834 Body mass index (BMI) 34.0-34.9, adult: Secondary | ICD-10-CM | POA: Diagnosis not present

## 2018-04-30 DIAGNOSIS — M25562 Pain in left knee: Secondary | ICD-10-CM | POA: Diagnosis not present

## 2018-04-30 DIAGNOSIS — L251 Unspecified contact dermatitis due to drugs in contact with skin: Secondary | ICD-10-CM | POA: Diagnosis not present

## 2018-04-30 NOTE — Therapy (Signed)
[place], [place], [address] Phone: [phone]   Fax:  [phone]  Wound Care Therapy  Patient Details  Name: Caitlin [name] MRN: [id number] Date of Birth: 02-15-1946 Referring Provider (PT): Sharilyn Sites    Encounter Date: 04/30/2018  PT End of Session - 04/30/18 1555    Visit Number  5    Number of Visits  12    Date for PT Re-Evaluation  05/31/18    Authorization - Visit Number  5    Authorization - Number of Visits  10    PT Start Time  3419    PT Stop Time  1038    PT Time Calculation (min)  43 min    Activity Tolerance  Patient tolerated treatment well    Behavior During Therapy  [facility] for tasks assessed/performed       Past Medical History:  Diagnosis Date  . Arthritis   . Asthma   . Breast cancer ([place])   . Breast disorder    cancer  . Breast mass    benign  . GERD (gastroesophageal reflux disease)   . Heart murmur    AS CHILD  OUTGREW IT  . History of hiatal hernia   . Hyperlipidemia   . Hypertension   . Impaired fasting glucose   . Rectocele   . Varicose vein of leg     Past Surgical History:  Procedure Laterality Date  . BREAST LUMPECTOMY WITH RADIOACTIVE SEED AND SENTINEL LYMPH NODE BIOPSY Right 11/11/2015   Procedure: RIGHT BREAST RADIOACTIVE SEED GUIDED  LUMPECTOMY AND RADIOACTIVE SEED TARGETED SENTINEL LYMPH NODE BIOPSY;  Surgeon: Stark Klein, MD;  Location: [place];  Service: General;  Laterality: Right;  . BREAST SURGERY Left    breast biopies x4  . CHOLECYSTECTOMY  09/08/2010  . COLONOSCOPY    . COLONOSCOPY N/A 09/25/2013   Procedure: COLONOSCOPY;  Surgeon: Rogene Houston, MD;  [facility];  Service: Endoscopy;  Laterality: N/A;  100-moved to 1200 Ann to notify pt  . HERNIA REPAIR  12/24/2010, 12-2014   TOTAL 4  . LAPAROSCOPIC GASTRIC SLEEVE RESECTION  11-2013   at [place]  2007/2008  . TONSILLECTOMY  1968  .  TOTAL KNEE ARTHROPLASTY Left 05/02/2017  . TOTAL KNEE ARTHROPLASTY Left 05/02/2017   Procedure: LEFT TOTAL KNEE ARTHROPLASTY;  Surgeon: [name], MD;  Location: [place];  Service: Orthopedics;  Laterality: Left;  . UPPER GASTROINTESTINAL ENDOSCOPY    . VAGINAL HYSTERECTOMY  1980s    There were no vitals filed for this visit.     Wound Therapy - 04/30/18 1556    Subjective  Patient reports she changed form this afternoon to this mornign for her appointment as she needed to go into her MD office for new antibiotics. Sh ebelieves she is having a reaction to them as she has developed a rash along her arms and legs.     Patient and Family Stated Goals  wound to heal and pain to be gone     Date of Onset  04/05/18    Prior Treatments  medication and self care.     Pain Scale  0-10    Pain Score  0-No pain    Evaluation and Treatment Procedures Explained to Patient/Family  Yes    Evaluation and Treatment Procedures  agreed to    Wound Properties Date First Assessed: 04/19/18 Time First Assessed:  0955 Wound Type: Venous stasis ulcer Location: Ankle Location Orientation: Left;Medial Present on Admission: Yes   Dressing Type  Gauze (Comment);Impregnated gauze (bismuth);Abdominal pads;Compression wrap    Dressing Changed  Changed    Dressing Status  Old drainage    Dressing Change Frequency  PRN    Site / Wound Assessment  Painful;Yellow    % Wound base Red or Granulating  10%   in open area   % Wound base Yellow/Fibrinous Exudate  90%    Peri-wound Assessment  Erythema (blanchable);Hemosiderin    Margins  Epibole (rolled edges)    Drainage Amount  Minimal    Drainage Description  Sanguineous    Treatment  Cleansed;Debridement (Selective)    Selective Debridement - Location  wound bed and perimeter    Selective Debridement - Tools Used  Forceps    Selective Debridement - Tissue Removed  slough/ devitalized tissue     Wound Therapy - Clinical Statement  Patient's wound continues to  present with firmly adhered yellow slough in wound bed. Continued with selective debridement with limited success around wound margins and in wound bed. Patient would likely benefit from pulsed lavage treatment to address adhered slough. Continued this date with medihoney and gauze. Periwound had decreased dry slough skin under slough was raw however less friable. Continued with xeroform and vaseline around perimeter. The periwound continues to be red and her toes were warm. She has denied fever, nausea, vomiting, and diarrhea. She reports she checked her temperature this AM. I reviewed signs/symptoms of systemic infection with patient again and educated on when to seek medical attention. She will continue to benefit from skilled wound care to promote healing environment. New cert being sent today to add pulsed lavage to treatment techniques.     Wound Therapy - Functional Problem List  Pain when walking     Factors Delaying/Impairing Wound Healing  Infection - systemic/local;Vascular compromise    Hydrotherapy Plan  Debridement;Dressing change;Patient/family education;Pulsatile lavage with suction    Wound Therapy - Frequency  3X / week   3x a week for the first week followed by 2x a week x 5 weeks   Wound Therapy - Current Recommendations  PT    Wound Plan  Cleanse and debride wound followed by application of compression.  Measure weekly.     Dressing   medihoney to wound, xeroform to perimeter, 2x2, profore.         PT Short Term Goals - 04/30/18 1610      PT SHORT TERM GOAL #1   Title  Pt pain to be no greater than a 4/10 to allow pt to be up for over an hour in comfort to complete household duties.     Time  3    Period  Weeks    Status  On-going      PT SHORT TERM GOAL #2   Title  Pt wound to be 100% granulated to decrease infection     Time  3    Period  Weeks    Status  On-going      PT SHORT TERM GOAL #3   Title  PT wound to to be approximated to 2x1.5 cm    Time  3    Period   Weeks    Status  On-going        PT Long Term Goals - 04/30/18 1610      PT LONG TERM GOAL #1   Title  PT to have no pain in  her left ankle to be able to be up for over 2 hours at a time to shop     Time  6    Period  Weeks    Status  On-going      PT LONG TERM GOAL #2   Title  Wound to be healed    Time  6    Period  Weeks    Status  On-going      PT LONG TERM GOAL #3   Title  PT to be wearing compression garment everyday.     Time  6    Period  Weeks    Status  On-going        Plan - 04/30/18 1556    Clinical Impression Statement  see above    Rehab Potential  Good    PT Frequency  3x / week    PT Duration  --   1 week followed by 2x a wk for 5 weeks    PT Treatment/Interventions  Other (comment);Compression bandaging   debridement, compression bandaging    PT Next Visit Plan  see above     Consulted and Agree with Plan of Care  Patient       Patient will benefit from skilled therapeutic intervention in order to improve the following deficits and impairments:  Increased edema, Difficulty walking, Decreased skin integrity, Pain  Visit Diagnosis: Pain in left ankle and joints of left foot  Difficulty in walking, not elsewhere classified  Ankle wound, left, sequela     Problem List Patient Active Problem List   Diagnosis Date Noted  . Unilateral primary osteoarthritis, left knee 05/02/2017  . S/P total knee replacement using cement, left 05/02/2017  . Primary osteoarthritis of left knee 12/21/2016  . Chondromalacia patellae, left knee 05/26/2016  . Breast cancer of upper-inner quadrant of right female breast ([place]) 11/04/2015  . GERD (gastroesophageal reflux disease) 09/02/2013  . Obesity 09/02/2013  . Ventral hernia, recurrent 09/02/2013  . Unspecified asthma(493.90) 12/12/2012  . Cough variant asthma 03/27/2012  . Right middle lobe syndrome 03/27/2012    Kipp Brood, PT, DPT Physical Therapist with [facility]  04/30/2018 4:11 PM    [place] [address] [place], [place], [address] Phone: [phone]   Fax:  [phone]  Name: Caitlin [name] MRN: [id number] Date of Birth: 10/27/1945

## 2018-05-02 ENCOUNTER — Ambulatory Visit (INDEPENDENT_AMBULATORY_CARE_PROVIDER_SITE_OTHER): Payer: Medicare Other | Admitting: Orthopaedic Surgery

## 2018-05-02 ENCOUNTER — Encounter (INDEPENDENT_AMBULATORY_CARE_PROVIDER_SITE_OTHER): Payer: Self-pay | Admitting: Orthopaedic Surgery

## 2018-05-02 ENCOUNTER — Ambulatory Visit (INDEPENDENT_AMBULATORY_CARE_PROVIDER_SITE_OTHER): Payer: Medicare Other

## 2018-05-02 ENCOUNTER — Ambulatory Visit (HOSPITAL_COMMUNITY): Payer: Medicare Other | Attending: Family Medicine

## 2018-05-02 VITALS — BP 115/69 | HR 104 | Ht 62.0 in | Wt 208.0 lb

## 2018-05-02 DIAGNOSIS — S91002S Unspecified open wound, left ankle, sequela: Secondary | ICD-10-CM

## 2018-05-02 DIAGNOSIS — M25511 Pain in right shoulder: Secondary | ICD-10-CM | POA: Diagnosis not present

## 2018-05-02 DIAGNOSIS — R262 Difficulty in walking, not elsewhere classified: Secondary | ICD-10-CM | POA: Insufficient documentation

## 2018-05-02 DIAGNOSIS — M25572 Pain in left ankle and joints of left foot: Secondary | ICD-10-CM | POA: Diagnosis not present

## 2018-05-02 DIAGNOSIS — G8929 Other chronic pain: Secondary | ICD-10-CM

## 2018-05-02 DIAGNOSIS — M25562 Pain in left knee: Secondary | ICD-10-CM | POA: Diagnosis not present

## 2018-05-02 MED ORDER — BUPIVACAINE HCL 0.5 % IJ SOLN
2.0000 mL | INTRAMUSCULAR | Status: AC | PRN
  Administered 2018-05-02: 2 mL via INTRA_ARTICULAR

## 2018-05-02 MED ORDER — LIDOCAINE HCL 2 % IJ SOLN
2.0000 mL | INTRAMUSCULAR | Status: AC | PRN
  Administered 2018-05-02: 2 mL

## 2018-05-02 MED ORDER — METHYLPREDNISOLONE ACETATE 40 MG/ML IJ SUSP
80.0000 mg | INTRAMUSCULAR | Status: AC | PRN
  Administered 2018-05-02: 80 mg

## 2018-05-02 NOTE — Progress Notes (Signed)
Office Visit Note   Patient: Caitlin [name]           Date of Birth: 01-22-1946           MRN: [id number] Visit Date: 05/02/2018              Requested by: Caitlin [name], Caitlin [name], Caitlin [name] [id number] PCP: Caitlin Sites, MD   Assessment & Plan: Visit Diagnoses:  1. Chronic right shoulder pain     Plan: Positive impingement right shoulder with the possibility of a rotator cuff tear.  Long discussion with Mrs. and Caitlin [name] regarding diagnostic possibilities.  Will inject the subacromial space with cortisone and monitor response.  Consider MRI scan if no improvement  Follow-Up Instructions: No follow-ups on file.   Orders:  Orders Placed This Encounter  Procedures  . XR Shoulder Right   No orders of the defined types were placed in this encounter.     Procedures: Large Joint Inj: R subacromial bursa on 05/02/2018 3:00 PM Indications: pain and diagnostic evaluation Details: 25 G 1.5 in needle, anterolateral approach  Arthrogram: No  Medications: 2 mL lidocaine 2 %; 2 mL bupivacaine 0.5 %; 80 mg methylPREDNISolone acetate 40 MG/ML Consent was given by the patient. Immediately prior to procedure a time out was called to verify the correct patient, procedure, equipment, support staff and site/side marked as required. Patient was prepped and draped in the usual sterile fashion.       Clinical Data: No additional findings.   Subjective: Chief Complaint  Patient presents with  . Follow-up    R SHOULDER PAIN FOR 4-5 MO NO INJURY NO INJECTIONS  Caitlin [name] is 72 years old accompanied by her husband and here for evaluation of right shoulder pain.  She first experienced insidious onset of shoulder pain approximately 4 to 5 months ago.  She is having difficulty raising her arm over her head and sleeping on that side with anterior lateral subacromial pain.  She relates that over 5 years ago she had a "fracture" about the shoulder.  Not having any numbness or  tingling.  Also a year status post primary left total knee replacement and very happy with the results.  Has developed cellulitis of her left leg being treated through the wound clinic.  Presently on antibiotics  HPI  Review of Systems  Constitutional: Positive for fatigue. Negative for fever.  HENT: Negative for ear pain.   Eyes: Negative for pain.  Respiratory: Negative for cough and shortness of breath.   Cardiovascular: Positive for leg swelling.  Gastrointestinal: Negative for constipation and diarrhea.  Genitourinary: Negative for difficulty urinating.  Musculoskeletal: Negative for back pain and neck pain.  Skin: Negative for rash.  Allergic/Immunologic: Negative for food allergies.  Neurological: Positive for weakness. Negative for numbness.  Hematological: Does not bruise/bleed easily.  Psychiatric/Behavioral: Negative for sleep disturbance.     Objective: Vital Signs: BP 115/69 (BP Location: Left Arm, Patient Position: Sitting, Cuff Size: Normal)   Pulse (!) 104   Ht 5\' 2"  (1.575 m)   Wt 208 lb (94.3 kg)   BMI 38.04 kg/m   Physical Exam  Constitutional: She is oriented to person, place, and time. She appears well-developed and well-nourished.  HENT:  Mouth/Throat: Oropharynx is clear and moist.  Eyes: Pupils are equal, round, and reactive to light. EOM are normal.  Pulmonary/Chest: Effort normal.  Neurological: She is alert and oriented to person, place, and time.  Skin: Skin is warm and dry.  Psychiatric: She has a normal mood and affect. Her behavior is normal.    Ortho Exam awake alert and oriented x3.  Comfortable sitting.  Left total knee replacement looks fine.  No pain or swelling or redness incision looks fine.  Full extension and flexion over 100 degrees without instability.  Positive impingement right shoulder on the extremes of internal and external rotation.  Able to place her arm fully overhead but with a painful arc of motion.  Skin intact.  Biceps  intact.  Some pain along the anterior lateral subacromial region.  Negative speeds sign.  Specialty Comments:  No specialty comments available.  Imaging: No results found.   PMFS History: Patient Active Problem List   Diagnosis Date Noted  . Unilateral primary osteoarthritis, left knee 05/02/2017  . S/P total knee replacement using cement, left 05/02/2017  . Primary osteoarthritis of left knee 12/21/2016  . Chondromalacia patellae, left knee 05/26/2016  . Breast cancer of upper-inner quadrant of right female breast ([place]) 11/04/2015  . GERD (gastroesophageal reflux disease) 09/02/2013  . Obesity 09/02/2013  . Ventral hernia, recurrent 09/02/2013  . Unspecified asthma(493.90) 12/12/2012  . Cough variant asthma 03/27/2012  . Right middle lobe syndrome 03/27/2012   Past Medical History:  Diagnosis Date  . Arthritis   . Asthma   . Breast cancer ([place])   . Breast disorder    cancer  . Breast mass    benign  . Cellulitis   . GERD (gastroesophageal reflux disease)   . Heart murmur    AS CHILD  OUTGREW IT  . History of hiatal hernia   . Hyperlipidemia   . Hypertension   . Impaired fasting glucose   . Rectocele   . Varicose vein of leg     Family History  Problem Relation Age of Onset  . Colon cancer Father   . Rectal cancer Father   . Prostate cancer Father   . Dementia Mother   . Osteoporosis Mother   . Healthy Daughter   . Healthy Son   . Pancreatic cancer Paternal Grandfather   . Heart disease Paternal Grandmother   . Heart disease Maternal Grandfather   . Breast cancer Maternal Grandmother   . Scoliosis Sister     Past Surgical History:  Procedure Laterality Date  . BREAST LUMPECTOMY WITH RADIOACTIVE SEED AND SENTINEL LYMPH NODE BIOPSY Right 11/11/2015   Procedure: RIGHT BREAST RADIOACTIVE SEED GUIDED  LUMPECTOMY AND RADIOACTIVE SEED TARGETED SENTINEL LYMPH NODE BIOPSY;  Surgeon: Stark Klein, MD;  Location: [place];  Service: General;   Laterality: Right;  . BREAST SURGERY Left    breast biopies x4  . CHOLECYSTECTOMY  09/08/2010  . COLONOSCOPY    . COLONOSCOPY N/A 09/25/2013   Procedure: COLONOSCOPY;  Surgeon: Rogene Houston, MD;  [facility];  Service: Endoscopy;  Laterality: N/A;  100-moved to 1200 Ann to notify pt  . HERNIA REPAIR  12/24/2010, 12-2014   TOTAL 4  . LAPAROSCOPIC GASTRIC SLEEVE RESECTION  11-2013   at [place]  2007/2008  . TONSILLECTOMY  1968  . TOTAL KNEE ARTHROPLASTY Left 05/02/2017  . TOTAL KNEE ARTHROPLASTY Left 05/02/2017   Procedure: LEFT TOTAL KNEE ARTHROPLASTY;  Surgeon: [name], MD;  Location: [place];  Service: Orthopedics;  Laterality: Left;  . UPPER GASTROINTESTINAL ENDOSCOPY    . VAGINAL HYSTERECTOMY  1980s   Social History   Occupational History  . Occupation: retired    Fish farm manager:  RETIRED    Comment: teacher  Tobacco Use  . Smoking status: Never Smoker  . Smokeless tobacco: Never Used  Substance and Sexual Activity  . Alcohol use: No  . Drug use: No  . Sexual activity: Yes    Birth control/protection: Surgical    Comment: hyst

## 2018-05-02 NOTE — Therapy (Addendum)
[place], [place], [address] Phone: [phone]   Fax:  [phone]  Wound Care Therapy  Patient Details  Name: Caitlin [name] MRN: [id number] Date of Birth: 26-Jun-1946 Referring Provider (PT): Sharilyn Sites    Encounter Date: 05/02/2018  PT End of Session - 05/02/18 1222    Visit Number  6    Number of Visits  12    Date for PT Re-Evaluation  05/28/18    Authorization Type  Medicare A & B    Authorization Time Period  9/30-->05/28/2018    Authorization - Visit Number  6    Authorization - Number of Visits  10    PT Start Time  0908    PT Stop Time  0946    PT Time Calculation (min)  38 min    Activity Tolerance  Patient tolerated treatment well    Behavior During Therapy  [facility] for tasks assessed/performed       Past Medical History:  Diagnosis Date  . Arthritis   . Asthma   . Breast cancer ([place])   . Breast disorder    cancer  . Breast mass    benign  . GERD (gastroesophageal reflux disease)   . Heart murmur    AS CHILD  OUTGREW IT  . History of hiatal hernia   . Hyperlipidemia   . Hypertension   . Impaired fasting glucose   . Rectocele   . Varicose vein of leg     Past Surgical History:  Procedure Laterality Date  . BREAST LUMPECTOMY WITH RADIOACTIVE SEED AND SENTINEL LYMPH NODE BIOPSY Right 11/11/2015   Procedure: RIGHT BREAST RADIOACTIVE SEED GUIDED  LUMPECTOMY AND RADIOACTIVE SEED TARGETED SENTINEL LYMPH NODE BIOPSY;  Surgeon: Stark Klein, MD;  Location: [place];  Service: General;  Laterality: Right;  . BREAST SURGERY Left    breast biopies x4  . CHOLECYSTECTOMY  09/08/2010  . COLONOSCOPY    . COLONOSCOPY N/A 09/25/2013   Procedure: COLONOSCOPY;  Surgeon: Rogene Houston, MD;  [facility];  Service: Endoscopy;  Laterality: N/A;  100-moved to 1200 Ann to notify pt  . HERNIA REPAIR  12/24/2010, 12-2014   TOTAL 4  . LAPAROSCOPIC GASTRIC SLEEVE RESECTION  11-2013   at [place]  2007/2008  . TONSILLECTOMY  1968  . TOTAL KNEE ARTHROPLASTY Left 05/02/2017  . TOTAL KNEE ARTHROPLASTY Left 05/02/2017   Procedure: LEFT TOTAL KNEE ARTHROPLASTY;  Surgeon: [name], MD;  Location: [place];  Service: Orthopedics;  Laterality: Left;  . UPPER GASTROINTESTINAL ENDOSCOPY    . VAGINAL HYSTERECTOMY  1980s    There were no vitals filed for this visit.              Wound Therapy - 05/02/18 0947    Subjective  Pt reports no pain, wound and LE are itchy.  Reports she continues to have areas of irritation following the antibiotics, has stopped taking.    Patient and Family Stated Goals  wound to heal and pain to be gone     Date of Onset  04/05/18    Prior Treatments  medication and self care.     Pain Scale  0-10    Pain Score  0-No pain    Evaluation and Treatment Procedures Explained to Patient/Family  Yes    Evaluation and Treatment Procedures  agreed to    Wound Properties Date First Assessed:  04/19/18 Time First Assessed: 2094 Wound Type: Venous stasis ulcer Location: Ankle Location Orientation: Left;Medial Present on Admission: Yes   Dressing Type  Gauze (Comment);Impregnated gauze (bismuth);Abdominal pads;Compression wrap   medihoney wound bed, xeroform perimeter with gauze and profo   Dressing Changed  Changed    Dressing Status  Old drainage    Dressing Change Frequency  PRN    Site / Wound Assessment  Painful;Yellow    % Wound base Red or Granulating  10%    % Wound base Yellow/Fibrinous Exudate  90%    Peri-wound Assessment  Erythema (blanchable);Hemosiderin    Wound Length (cm)  0.9 cm   was 1.2x 3 with ? depth   Wound Width (cm)  1.2 cm   was 3   Wound Depth (cm)  --   unknown   Wound Surface Area (cm^2)  1.08 cm^2    Margins  Epibole (rolled edges)    Drainage Amount  Minimal    Drainage Description  Sanguineous    Treatment  Cleansed;Debridement (Selective);Hydrotherapy (Pulse lavage)     Pulsed lavage therapy - wound location  ankle    Pulsed Lavage with Suction (psi)  4 psi    Pulsed Lavage with Suction - Normal Saline Used  1000 mL    Pulsed Lavage Tip  Tip with splash shield    Selective Debridement - Location  wound bed and perimeter    Selective Debridement - Tools Used  Forceps;Scalpel    Selective Debridement - Tissue Removed  slough/ devitalized tissue     Wound Therapy - Clinical Statement  Added PLS to address adherent slough in wound bed.  Selective debridement for removal of slough in wound bed, dry skin perimeter and small x's around edges to address the epibole edges.  Continued with medihoney gauze to wound bed and xeroform to perimeter wiht profore to address edema.  No reoprts of pain though session, did add vaseline and lotion to address the itching.      Wound Therapy - Functional Problem List  Pain when walking     Factors Delaying/Impairing Wound Healing  Infection - systemic/local;Vascular compromise    Hydrotherapy Plan  Debridement;Dressing change;Patient/family education;Pulsatile lavage with suction    Wound Therapy - Frequency  3X / week    Wound Therapy - Current Recommendations  PT    Wound Plan  Cleanse and debride wound followed by application of compression.  Measure weekly.     Dressing   medihoney to wound, xeroform to perimeter, 2x2, profore.                 PT Short Term Goals - 04/30/18 1610      PT SHORT TERM GOAL #1   Title  Pt pain to be no greater than a 4/10 to allow pt to be up for over an hour in comfort to complete household duties.     Time  3    Period  Weeks    Status  On-going      PT SHORT TERM GOAL #2   Title  Pt wound to be 100% granulated to decrease infection     Time  3    Period  Weeks    Status  On-going      PT SHORT TERM GOAL #3   Title  PT wound to to be approximated to 2x1.5 cm    Time  3    Period  Weeks    Status  On-going  PT Long Term Goals - 04/30/18 1610      PT LONG TERM GOAL  #1   Title  PT to have no pain in her left ankle to be able to be up for over 2 hours at a time to shop     Time  6    Period  Weeks    Status  On-going      PT LONG TERM GOAL #2   Title  Wound to be healed    Time  6    Period  Weeks    Status  On-going      PT LONG TERM GOAL #3   Title  PT to be wearing compression garment everyday.     Time  6    Period  Weeks    Status  On-going              Patient will benefit from skilled therapeutic intervention in order to improve the following deficits and impairments:     Visit Diagnosis: Pain in left ankle and joints of left foot  Difficulty in walking, not elsewhere classified  Ankle wound, left, sequela     Problem List Patient Active Problem List   Diagnosis Date Noted  . Unilateral primary osteoarthritis, left knee 05/02/2017  . S/P total knee replacement using cement, left 05/02/2017  . Primary osteoarthritis of left knee 12/21/2016  . Chondromalacia patellae, left knee 05/26/2016  . Breast cancer of upper-inner quadrant of right female breast ([place]) 11/04/2015  . GERD (gastroesophageal reflux disease) 09/02/2013  . Obesity 09/02/2013  . Ventral hernia, recurrent 09/02/2013  . Unspecified asthma(493.90) 12/12/2012  . Cough variant asthma 03/27/2012  . Right middle lobe syndrome 03/27/2012   Ihor Austin, LPTA; [place]  Aldona Lento 05/02/2018, 12:50 PM  [place], [place], [address] Phone: [phone]   Fax:  [phone]  Name: Caitlin [name] MRN: [id number] Date of Birth: 06-03-1946

## 2018-05-03 NOTE — Addendum Note (Signed)
Addended by: Leeroy Cha on: 05/03/2018 01:47 PM   Modules accepted: Orders

## 2018-05-04 ENCOUNTER — Ambulatory Visit (HOSPITAL_COMMUNITY): Payer: Medicare Other | Admitting: Physical Therapy

## 2018-05-04 ENCOUNTER — Encounter (HOSPITAL_COMMUNITY): Payer: Self-pay | Admitting: Physical Therapy

## 2018-05-04 DIAGNOSIS — R262 Difficulty in walking, not elsewhere classified: Secondary | ICD-10-CM

## 2018-05-04 DIAGNOSIS — M25572 Pain in left ankle and joints of left foot: Secondary | ICD-10-CM | POA: Diagnosis not present

## 2018-05-04 DIAGNOSIS — S91002S Unspecified open wound, left ankle, sequela: Secondary | ICD-10-CM | POA: Diagnosis not present

## 2018-05-04 DIAGNOSIS — M25562 Pain in left knee: Secondary | ICD-10-CM | POA: Diagnosis not present

## 2018-05-04 NOTE — Therapy (Signed)
Caitlin [name], [place], [address] Phone: [phone]   Fax:  [phone]  Wound Care Therapy  Patient Details  Name: Caitlin [name] MRN: [id number] Date of Birth: [birth year] Referring Provider (PT): Sharilyn Sites    Encounter Date: 05/04/2018  PT End of Session - 05/04/18 1228    Visit Number  7    Number of Visits  12    Date for PT Re-Evaluation  05/28/18    Authorization Type  Medicare A & B    Authorization Time Period  9/30-->05/28/2018    Authorization - Visit Number  7    Authorization - Number of Visits  10    PT Start Time  1115    PT Stop Time  1155    PT Time Calculation (min)  40 min    Activity Tolerance  Patient tolerated treatment well    Behavior During Therapy  [facility] for tasks assessed/performed       Past Medical History:  Diagnosis Date  . Arthritis   . Asthma   . Breast cancer ([place])   . Breast disorder    cancer  . Breast mass    benign  . Cellulitis   . GERD (gastroesophageal reflux disease)   . Heart murmur    AS CHILD  OUTGREW IT  . History of hiatal hernia   . Hyperlipidemia   . Hypertension   . Impaired fasting glucose   . Rectocele   . Varicose vein of leg     Past Surgical History:  Procedure Laterality Date  . BREAST LUMPECTOMY WITH RADIOACTIVE SEED AND SENTINEL LYMPH NODE BIOPSY Right 11/11/2015   Procedure: RIGHT BREAST RADIOACTIVE SEED GUIDED  LUMPECTOMY AND RADIOACTIVE SEED TARGETED SENTINEL LYMPH NODE BIOPSY;  Surgeon: Stark Klein, MD;  Location: [place];  Service: General;  Laterality: Right;  . BREAST SURGERY Left    breast biopies x4  . CHOLECYSTECTOMY  09/08/2010  . COLONOSCOPY    . COLONOSCOPY N/A 09/25/2013   Procedure: COLONOSCOPY;  Surgeon: Rogene Houston, MD;  [facility];  Service: Endoscopy;  Laterality: N/A;  100-moved to 1200 Ann to notify pt  . HERNIA REPAIR  12/24/2010, 12-2014   TOTAL 4  . LAPAROSCOPIC GASTRIC SLEEVE  RESECTION  11-2013   at [place]  2007/2008  . TONSILLECTOMY  1968  . TOTAL KNEE ARTHROPLASTY Left 05/02/2017  . TOTAL KNEE ARTHROPLASTY Left 05/02/2017   Procedure: LEFT TOTAL KNEE ARTHROPLASTY;  Surgeon: [name], MD;  Location: [place];  Service: Orthopedics;  Laterality: Left;  . UPPER GASTROINTESTINAL ENDOSCOPY    . VAGINAL HYSTERECTOMY  1980s    There were no vitals filed for this visit.              Wound Therapy - 05/04/18 1223    Subjective  Pt states that her whole leg has been itchingl    Patient and Family Stated Goals  wound to heal and pain to be gone     Date of Onset  04/05/18    Prior Treatments  medication and self care.     Pain Score  3     Pain Type  Acute pain    Pain Location  Ankle    Pain Orientation  Left;Medial    Evaluation and Treatment Procedures Explained to Patient/Family  Yes    Evaluation and Treatment Procedures  agreed to  Wound Properties Date First Assessed: 04/19/18 Time First Assessed: 0955 Wound Type: Venous stasis ulcer Location: Ankle Location Orientation: Left;Medial Present on Admission: Yes   Dressing Type  Gauze (Comment);Impregnated gauze (bismuth);Abdominal pads;Compression wrap   2x2 impregnated with medihoney packed into wound bed    Dressing Changed  Changed    Dressing Status  Old drainage    Dressing Change Frequency  PRN    Site / Wound Assessment  Painful;Yellow    % Wound base Red or Granulating  10%    % Wound base Yellow/Fibrinous Exudate  90%    Peri-wound Assessment  Erythema (blanchable);Hemosiderin    Wound Length (cm)  0.8 cm    Wound Width (cm)  1 cm    Wound Depth (cm)  0.7 cm   at least   Wound Volume (cm^3)  0.56 cm^3    Wound Surface Area (cm^2)  0.8 cm^2    Undermining (cm)  all edges but especially superiorly     Margins  Epibole (rolled edges)    Drainage Amount  Minimal    Drainage Description  Sanguineous    Treatment  Debridement  (Selective);Electrical stimulation;Hydrotherapy (Pulse lavage)    Pulsed lavage therapy - wound location  ankle    Pulsed Lavage with Suction (psi)  4 psi    Pulsed Lavage with Suction - Normal Saline Used  1000 mL    Pulsed Lavage Tip  Tip with splash shield    Selective Debridement - Location  wound bed and perimeter    Selective Debridement - Tools Used  Forceps;Scalpel    Selective Debridement - Tissue Removed  slough/ devitalized tissue     Wound Therapy - Clinical Statement  Pt wound is undermining in all directions with a possible tunnel going superiorly.  Packed 2x2 with medihoney and packed wound.  If wound bed granualtion is not improving by next week, may benefit in asking MD if he feels santyl would be beneficial.    Wound Therapy - Functional Problem List  Pain when walking     Factors Delaying/Impairing Wound Healing  Infection - systemic/local;Vascular compromise    Hydrotherapy Plan  Debridement;Dressing change;Patient/family education;Pulsatile lavage with suction    Wound Therapy - Frequency  3X / week    Wound Therapy - Current Recommendations  PT    Wound Plan  Cleanse and debride wound followed by application of compression.  Measure weekly.     Dressing   medihoney to wound, , 2x2, profore.                 PT Short Term Goals - 04/30/18 1610      PT SHORT TERM GOAL #1   Title  Pt pain to be no greater than a 4/10 to allow pt to be up for over an hour in comfort to complete household duties.     Time  3    Period  Weeks    Status  On-going      PT SHORT TERM GOAL #2   Title  Pt wound to be 100% granulated to decrease infection     Time  3    Period  Weeks    Status  On-going      PT SHORT TERM GOAL #3   Title  PT wound to to be approximated to 2x1.5 cm    Time  3    Period  Weeks    Status  On-going        PT Long Term Goals -  04/30/18 1610      PT LONG TERM GOAL #1   Title  PT to have no pain in her left ankle to be able to be up for over 2  hours at a time to shop     Time  6    Period  Weeks    Status  On-going      PT LONG TERM GOAL #2   Title  Wound to be healed    Time  6    Period  Weeks    Status  On-going      PT LONG TERM GOAL #3   Title  PT to be wearing compression garment everyday.     Time  6    Period  Weeks    Status  On-going            Plan - 05/04/18 1228    Clinical Impression Statement  as above     Rehab Potential  Good    PT Frequency  3x / week    PT Duration  --   1 week followed by 2x a wk for 5 weeks    PT Treatment/Interventions  Other (comment);Compression bandaging   debridement, compression bandaging    PT Next Visit Plan  see above     Consulted and Agree with Plan of Care  Patient       Patient will benefit from skilled therapeutic intervention in order to improve the following deficits and impairments:  Increased edema, Difficulty walking, Decreased skin integrity, Pain  Visit Diagnosis: Pain in left ankle and joints of left foot  Difficulty in walking, not elsewhere classified  Ankle wound, left, sequela     Problem List Patient Active Problem List   Diagnosis Date Noted  . Unilateral primary osteoarthritis, left knee 05/02/2017  . S/P total knee replacement using cement, left 05/02/2017  . Primary osteoarthritis of left knee 12/21/2016  . Chondromalacia patellae, left knee 05/26/2016  . Breast cancer of upper-inner quadrant of right female breast ([place]) 11/04/2015  . GERD (gastroesophageal reflux disease) 09/02/2013  . Obesity 09/02/2013  . Ventral hernia, recurrent 09/02/2013  . Unspecified asthma(493.90) 12/12/2012  . Cough variant asthma 03/27/2012  . Right middle lobe syndrome 03/27/2012   Rayetta Humphrey, PT CLT [phone] 05/04/2018, 12:29 PM  [facility] [address] [place], [place], [address] Phone: [phone]   Fax:  [phone]  Name: ZANYAH LENTSCH MRN: [id number] Date of Birth:  [birth year]

## 2018-05-07 ENCOUNTER — Emergency Department (HOSPITAL_COMMUNITY): Payer: Medicare Other

## 2018-05-07 ENCOUNTER — Ambulatory Visit (HOSPITAL_COMMUNITY): Payer: Medicare Other

## 2018-05-07 ENCOUNTER — Encounter (HOSPITAL_COMMUNITY): Payer: Self-pay

## 2018-05-07 ENCOUNTER — Telehealth (HOSPITAL_COMMUNITY): Payer: Self-pay | Admitting: Family Medicine

## 2018-05-07 ENCOUNTER — Encounter (HOSPITAL_COMMUNITY): Payer: Self-pay | Admitting: *Deleted

## 2018-05-07 ENCOUNTER — Emergency Department (HOSPITAL_COMMUNITY)
Admission: EM | Admit: 2018-05-07 | Discharge: 2018-05-07 | Disposition: A | Discharge status: Home / Self Care | Payer: Medicare Other | Attending: Emergency Medicine | Admitting: Emergency Medicine

## 2018-05-07 ENCOUNTER — Other Ambulatory Visit: Payer: Self-pay

## 2018-05-07 DIAGNOSIS — M79662 Pain in left lower leg: Secondary | ICD-10-CM | POA: Diagnosis present

## 2018-05-07 DIAGNOSIS — S91002S Unspecified open wound, left ankle, sequela: Secondary | ICD-10-CM

## 2018-05-07 DIAGNOSIS — L03116 Cellulitis of left lower limb: Secondary | ICD-10-CM | POA: Diagnosis not present

## 2018-05-07 DIAGNOSIS — J45909 Unspecified asthma, uncomplicated: Secondary | ICD-10-CM | POA: Insufficient documentation

## 2018-05-07 DIAGNOSIS — L0390 Cellulitis, unspecified: Secondary | ICD-10-CM

## 2018-05-07 DIAGNOSIS — I10 Essential (primary) hypertension: Secondary | ICD-10-CM | POA: Insufficient documentation

## 2018-05-07 DIAGNOSIS — Z6835 Body mass index (BMI) 35.0-35.9, adult: Secondary | ICD-10-CM | POA: Diagnosis not present

## 2018-05-07 DIAGNOSIS — R262 Difficulty in walking, not elsewhere classified: Secondary | ICD-10-CM

## 2018-05-07 DIAGNOSIS — Z96652 Presence of left artificial knee joint: Secondary | ICD-10-CM | POA: Insufficient documentation

## 2018-05-07 DIAGNOSIS — Z853 Personal history of malignant neoplasm of breast: Secondary | ICD-10-CM | POA: Insufficient documentation

## 2018-05-07 DIAGNOSIS — M25562 Pain in left knee: Secondary | ICD-10-CM | POA: Diagnosis not present

## 2018-05-07 DIAGNOSIS — M009 Pyogenic arthritis, unspecified: Secondary | ICD-10-CM | POA: Diagnosis not present

## 2018-05-07 DIAGNOSIS — E6609 Other obesity due to excess calories: Secondary | ICD-10-CM | POA: Diagnosis not present

## 2018-05-07 DIAGNOSIS — I739 Peripheral vascular disease, unspecified: Secondary | ICD-10-CM | POA: Diagnosis not present

## 2018-05-07 DIAGNOSIS — M25572 Pain in left ankle and joints of left foot: Secondary | ICD-10-CM

## 2018-05-07 LAB — CBC WITH DIFFERENTIAL/PLATELET
BASOS PCT: 1 %
Basophils Absolute: 0 10*3/uL (ref 0.0–0.1)
EOS PCT: 15 %
Eosinophils Absolute: 0.8 10*3/uL — ABNORMAL HIGH (ref 0.0–0.7)
HEMATOCRIT: 32 % — AB (ref 36.0–46.0)
HEMOGLOBIN: 10.8 g/dL — AB (ref 12.0–15.0)
Lymphocytes Relative: 29 %
Lymphs Abs: 1.4 10*3/uL (ref 0.7–4.0)
MCH: 32.9 pg (ref 26.0–34.0)
MCHC: 33.8 g/dL (ref 30.0–36.0)
MCV: 97.6 fL (ref 78.0–100.0)
MONO ABS: 1 10*3/uL (ref 0.1–1.0)
MONOS PCT: 19 %
NEUTROS ABS: 1.8 10*3/uL (ref 1.7–7.7)
Neutrophils Relative %: 36 %
Platelets: 300 10*3/uL (ref 150–400)
RBC: 3.28 MIL/uL — ABNORMAL LOW (ref 3.87–5.11)
RDW: 12.5 % (ref 11.5–15.5)
WBC: 5 10*3/uL (ref 4.0–10.5)

## 2018-05-07 LAB — BASIC METABOLIC PANEL
Anion gap: 8 (ref 5–15)
BUN: 11 mg/dL (ref 8–23)
CHLORIDE: 95 mmol/L — AB (ref 98–111)
CO2: 25 mmol/L (ref 22–32)
Calcium: 8.8 mg/dL — ABNORMAL LOW (ref 8.9–10.3)
Creatinine, Ser: 0.67 mg/dL (ref 0.44–1.00)
GFR calc Af Amer: >60 mL/min (ref >60)
GFR calc non Af Amer: >60 mL/min (ref >60)
Glucose, Bld: 88 mg/dL (ref 70–99)
Potassium: 3.5 mmol/L (ref 3.5–5.1)
Sodium: 128 mmol/L — ABNORMAL LOW (ref 135–145)

## 2018-05-07 MED ORDER — AMOXICILLIN-POT CLAVULANATE 875-125 MG PO TABS
1.0000 | ORAL_TABLET | Freq: Once | ORAL | Status: AC
  Administered 2018-05-07: 1 via ORAL
  Filled 2018-05-07: qty 1

## 2018-05-07 MED ORDER — AMOXICILLIN-POT CLAVULANATE 875-125 MG PO TABS: 1.0000 | ORAL_TABLET | Freq: Two times a day (BID) | ORAL | 0 refills | Status: DC

## 2018-05-07 MED ORDER — GADOBUTROL 1 MMOL/ML IV SOLN
8.0000 mL | Freq: Once | INTRAVENOUS | Status: AC | PRN
  Administered 2018-05-07: 8 mL via INTRAVENOUS

## 2018-05-07 NOTE — ED Provider Notes (Signed)
Emergency Department Provider Note   I have reviewed the triage vital signs and the nursing notes.   HISTORY  Chief Complaint Cellulitis   HPI Caitlin [name] is a 72 y.o. female with multiple medical problems as documented below the presents to the emergency department today secondary to persistent left leg cellulitis.  She states that she had a varicose vein bleed about 7 weeks ago after everything started.  Shortly afterwards started having some pain in the ankle on the left that was diagnosed as inflammatory starting prednisones and anti-inflammatories which did not seem to help so she presented to the emergency room.  She is diagnosed with cellulitis and started on Bactrim.  She made it almost 2 weeks of Bactrim before she started having side effects so she stopped the Bactrim and saw her doctor.  She got set up with wound care and is been going 3 times a week.  Her doctor started on doxycycline about a week ago.  She thinks that the wound might be improving some since that time and she has some pictures that show that it probably is improving a little bit.  She would see her doctor today and secondary to the failure to heal center here for admission for IV antibiotics.  No systemic symptoms. No other associated or modifying symptoms.    Past Medical History:  Diagnosis Date  . Arthritis   . Asthma   . Breast cancer ([place])   . Breast disorder    cancer  . Breast mass    benign  . Cellulitis   . GERD (gastroesophageal reflux disease)   . Heart murmur    AS CHILD  OUTGREW IT  . History of hiatal hernia   . Hyperlipidemia   . Hypertension   . Impaired fasting glucose   . Rectocele   . Varicose vein of leg     Patient Active Problem List   Diagnosis Date Noted  . Unilateral primary osteoarthritis, left knee 05/02/2017  . S/P total knee replacement using cement, left 05/02/2017  . Primary osteoarthritis of left knee 12/21/2016  . Chondromalacia patellae, left knee  05/26/2016  . Breast cancer of upper-inner quadrant of right female breast ([place]) 11/04/2015  . GERD (gastroesophageal reflux disease) 09/02/2013  . Obesity 09/02/2013  . Ventral hernia, recurrent 09/02/2013  . Unspecified asthma(493.90) 12/12/2012  . Cough variant asthma 03/27/2012  . Right middle lobe syndrome 03/27/2012    Past Surgical History:  Procedure Laterality Date  . BREAST LUMPECTOMY WITH RADIOACTIVE SEED AND SENTINEL LYMPH NODE BIOPSY Right 11/11/2015   Procedure: RIGHT BREAST RADIOACTIVE SEED GUIDED  LUMPECTOMY AND RADIOACTIVE SEED TARGETED SENTINEL LYMPH NODE BIOPSY;  Surgeon: Stark Klein, MD;  Location: [facility];  Service: General;  Laterality: Right;  . BREAST SURGERY Left    breast biopies x4  . CHOLECYSTECTOMY  09/08/2010  . COLONOSCOPY    . COLONOSCOPY N/A 09/25/2013   Procedure: COLONOSCOPY;  Surgeon: Rogene Houston, MD;  [facility];  Service: Endoscopy;  Laterality: N/A;  100-moved to 1200 Ann to notify pt  . HERNIA REPAIR  12/24/2010, 12-2014   TOTAL 4  . LAPAROSCOPIC GASTRIC SLEEVE RESECTION  11-2013   at [place]  2007/2008  . TONSILLECTOMY  1968  . TOTAL KNEE ARTHROPLASTY Left 05/02/2017  . TOTAL KNEE ARTHROPLASTY Left 05/02/2017   Procedure: LEFT TOTAL KNEE ARTHROPLASTY;  Surgeon: [name], MD;  Location: [facility];  Service: Orthopedics;  Laterality: Left;  . UPPER GASTROINTESTINAL ENDOSCOPY    . VAGINAL HYSTERECTOMY  1980s    Current Outpatient Rx  . Order #: [id number] Class: Normal  . Order #: [id number] Class: Historical Med  . Order #: [id number] Class: Print  . Order #: [id number] Class: Historical Med  . Order #: [id number] Class: Normal  . Order #: [id number] Class: Historical Med  . Order #: [id number] Class: Historical Med  . Order #: [id number] Class: Historical Med  . Order #: [id number] Class: Historical Med  . Order #: [id number] Class: Historical Med  . Order #: [id number] Class:  Historical Med  . Order #: [id number] Class: Historical Med  . Order #: [id number] Class: Historical Med  . Order #: [id number] Class: Historical Med  . Order #: [id number] Class: Historical Med  . Order #: [id number] Class: Normal  . Order #: [id number] Class: Historical Med  . Order #: [id number] Class: Normal    Allergies Horse-derived products; Sulfa antibiotics; and Tetanus toxoid adsorbed  Family History  Problem Relation Age of Onset  . Colon cancer Father   . Rectal cancer Father   . Prostate cancer Father   . Dementia Mother   . Osteoporosis Mother   . Healthy Daughter   . Healthy Son   . Pancreatic cancer Paternal Grandfather   . Heart disease Paternal Grandmother   . Heart disease Maternal Grandfather   . Breast cancer Maternal Grandmother   . Scoliosis Sister     Social History Social History   Tobacco Use  . Smoking status: Never Smoker  . Smokeless tobacco: Never Used  Substance Use Topics  . Alcohol use: No  . Drug use: No    Review of Systems  All other systems negative except as documented in the HPI. All pertinent positives and negatives as reviewed in the HPI. ____________________________________________   PHYSICAL EXAM:  VITAL SIGNS: ED Triage Vitals  Enc Vitals Group     BP 05/07/18 1329 (!) 144/67     Pulse Rate 05/07/18 1329 81     Resp 05/07/18 1329 16     Temp 05/07/18 1329 98.6 F (37 C)     Temp Source 05/07/18 1329 Oral     SpO2 05/07/18 1329 99 %     Weight 05/07/18 1328 208 lb (94.3 kg)     Height 05/07/18 1328 5\' 2"  (1.575 m)     Head Circumference --      Peak Flow --      Pain Score 05/07/18 1329 3     Pain Loc --      Pain Edu? --      Excl. in [place]? --     Constitutional: Alert and oriented. Well appearing and in no acute distress. Eyes: Conjunctivae are normal. PERRL. EOMI. Head: Atraumatic. Nose: No congestion/rhinnorhea. Mouth/Throat: Mucous membranes are moist.  Oropharynx non-erythematous. Neck: No stridor.  No meningeal  signs.   Cardiovascular: Normal rate, regular rhythm. Good peripheral circulation. Grossly normal heart sounds.   Respiratory: Normal respiratory effort.  No retractions. Lungs CTAB. Gastrointestinal: Soft and nontender. No distention.  Musculoskeletal: No lower extremity tenderness nor edema. No gross deformities of extremities. Neurologic:  Normal speech and language. No gross focal neurologic deficits are appreciated.  Skin:  Skin is warm, dry. Initially dressing in place, removed and found to have 9-10 cm X 4-5 cm cellulitic area around an open ulcer packed. No purulent drainage at this time (dressed earlier today).  ____________________________________________   LABS (all labs ordered are listed, but only abnormal results are displayed)  Labs Reviewed  CBC WITH DIFFERENTIAL/PLATELET -  Abnormal; Notable for the following components:      Result Value   RBC 3.28 (*)    Hemoglobin 10.8 (*)    HCT 32.0 (*)    Eosinophils Absolute 0.8 (*)    All other components within normal limits  BASIC METABOLIC PANEL - Abnormal; Notable for the following components:   Sodium 128 (*)    Chloride 95 (*)    Calcium 8.8 (*)    All other components within normal limits  CULTURE, BLOOD (ROUTINE X 2)  CULTURE, BLOOD (ROUTINE X 2)   ____________________________________________  EKG   EKG Interpretation  Date/Time:    Ventricular Rate:    PR Interval:    QRS Duration:   QT Interval:    QTC Calculation:   R Axis:     Text Interpretation:         ____________________________________________  RADIOLOGY  Mr Ankle Left W Wo Contrast  Result Date: 05/07/2018 CLINICAL DATA:  Cellulitis of the ankle x1 month. Assess for osteomyelitis. EXAM: MRI OF THE LEFT ANKLE WITHOUT AND WITH CONTRAST TECHNIQUE: Multiplanar, multisequence MR imaging of the ankle was performed before and after the administration of intravenous contrast. CONTRAST:  8 mL Gadavist COMPARISON:  Radiographs 04/07/2018 of the  ankle FINDINGS: TENDONS Peroneal: Intact peroneus longus and peroneus brevis tendons. Posteromedial: Intact tibialis posterior, flexor hallucis longus and flexor digitorum longus tendons. Anterior: Intact tibialis anterior, extensor hallucis longus and extensor digitorum longus tendons. Achilles: Intact. Plantar Fascia: Intact. LIGAMENTS Lateral: Intact. Medial: Intact. CARTILAGE Ankle Joint: No joint effusion or focal chondral defect. Subtalar Joints/Sinus Tarsi: Intact without joint effusion Bones: No marrow signal abnormality indicative of acute osteomyelitis. No fracture or joint dislocation. Other: Soft tissue ulceration along the medial aspect of the hindfoot with associated subcutaneous soft tissue edema consistent with cellulitis. IMPRESSION: IMPRESSION Soft tissue ulcer and cellulitis along the medial aspect of the hindfoot. No marrow signal abnormality indicative of acute osteomyelitis. Electronically Signed   By: Ashley Royalty M.D.   On: 05/07/2018 17:02    ____________________________________________   PROCEDURES  Procedure(s) performed:   Procedures   ____________________________________________   INITIAL IMPRESSION / ASSESSMENT AND PLAN / ED COURSE  PCP sent her in for IV antibiotics related to nonhealing cellulitis of left leg.  MRI done in the emergency room and no deep-seated infection but does show consistent with cellulitis.  Not septic.  Will discuss with hospitalist regarding appropriate management.  After discussion with the hospitalist review of the patient's MRI, labs and wound care notes along with her personal observation husband's observation that the cellulitis seems to be improving over the last week with the doxycycline we decided to add on Augmentin and have her follow-up with her primary doctor.  No evidence of sepsis or other absolute indication for admission to hospital at this time.     Pertinent labs & imaging results that were available during my care of the  patient were reviewed by me and considered in my medical decision making (see chart for details).  ____________________________________________  FINAL CLINICAL IMPRESSION(S) / ED DIAGNOSES  Final diagnoses:  Cellulitis, unspecified cellulitis site     MEDICATIONS GIVEN DURING THIS VISIT:  Medications  gadobutrol (GADAVIST) 1 MMOL/ML injection 8 mL (8 mLs Intravenous Contrast Given 05/07/18 1624)     NEW OUTPATIENT MEDICATIONS STARTED DURING THIS VISIT:  New Prescriptions   No medications on file    Note:  This note was prepared with assistance of Dragon voice recognition software. Occasional wrong-word or sound-a-like  substitutions may have occurred due to the inherent limitations of voice recognition software.   Merrily Pew, MD 05/07/18 2052

## 2018-05-07 NOTE — Telephone Encounter (Signed)
05/07/18  I cx ... patient had 4 appts back to back... she said she was only supposed to come 3x a week.Caitlin KitchenMarland KitchenMarland [name]

## 2018-05-07 NOTE — ED Notes (Signed)
Left ankle cleaned and dressed with hydrogel dressing. Wrapped with cling and ace. Per outpt rehab debt

## 2018-05-07 NOTE — ED Notes (Addendum)
Pt noted to have opening to left medial ankle. Yellow tissue. Surrounding skin purple in color

## 2018-05-07 NOTE — ED Triage Notes (Signed)
Pt has cellulitis to left ankle x 1 month. Pt was sent over by Dr. Wolfgang Phoenix due to failed oral antibiotics and need for IV antibiotics. Pt sees the wound clinic 3x weekly for the cellulitis. Pt currently on Doxycycline.

## 2018-05-07 NOTE — Therapy (Signed)
[facility] [place], [place], [address] Phone: [phone]   Fax:  [phone]  Wound Care Therapy  Patient Details  Name: Caitlin [name] MRN: [id number] Date of Birth: 1945-08-14 Referring Provider (PT): Sharilyn Sites    Encounter Date: 05/07/2018  PT End of Session - 05/07/18 1305    Visit Number  8    Number of Visits  12    Date for PT Re-Evaluation  05/28/18    Authorization Type  Medicare A & B    Authorization Time Period  9/30-->05/28/2018    Authorization - Visit Number  8    Authorization - Number of Visits  10    PT Start Time  0901    PT Stop Time  0942    PT Time Calculation (min)  41 min    Activity Tolerance  Patient tolerated treatment well    Behavior During Therapy  [facility] for tasks assessed/performed       Past Medical History:  Diagnosis Date  . Arthritis   . Asthma   . Breast cancer ([place])   . Breast disorder    cancer  . Breast mass    benign  . Cellulitis   . GERD (gastroesophageal reflux disease)   . Heart murmur    AS CHILD  OUTGREW IT  . History of hiatal hernia   . Hyperlipidemia   . Hypertension   . Impaired fasting glucose   . Rectocele   . Varicose vein of leg     Past Surgical History:  Procedure Laterality Date  . BREAST LUMPECTOMY WITH RADIOACTIVE SEED AND SENTINEL LYMPH NODE BIOPSY Right 11/11/2015   Procedure: RIGHT BREAST RADIOACTIVE SEED GUIDED  LUMPECTOMY AND RADIOACTIVE SEED TARGETED SENTINEL LYMPH NODE BIOPSY;  Surgeon: Stark Klein, MD;  Location: [facility];  Service: General;  Laterality: Right;  . BREAST SURGERY Left    breast biopies x4  . CHOLECYSTECTOMY  09/08/2010  . COLONOSCOPY    . COLONOSCOPY N/A 09/25/2013   Procedure: COLONOSCOPY;  Surgeon: Rogene Houston, MD;  [facility];  Service: Endoscopy;  Laterality: N/A;  100-moved to 1200 Ann to notify pt  . HERNIA REPAIR  12/24/2010, 12-2014   TOTAL 4  . LAPAROSCOPIC GASTRIC SLEEVE  RESECTION  11-2013   at [place]  2007/2008  . TONSILLECTOMY  1968  . TOTAL KNEE ARTHROPLASTY Left 05/02/2017  . TOTAL KNEE ARTHROPLASTY Left 05/02/2017   Procedure: LEFT TOTAL KNEE ARTHROPLASTY;  Surgeon: [name], MD;  Location: [facility];  Service: Orthopedics;  Laterality: Left;  . UPPER GASTROINTESTINAL ENDOSCOPY    . VAGINAL HYSTERECTOMY  1980s    There were no vitals filed for this visit.   Wound Therapy - 05/07/18 1256    Subjective  Patient denies pain today. She reports that her leg isn't itching as much. She still has a rash and states she has been on her new antibiotic for about 1 week, she is going to see her doctor again to follow up about medication change.     Patient and Family Stated Goals  wound to heal and pain to be gone     Date of Onset  04/05/18    Prior Treatments  medication and self care.     Pain Scale  0-10    Pain Score  0-No pain    Evaluation and Treatment Procedures Explained to Patient/Family  Yes  Evaluation and Treatment Procedures  agreed to    Wound Properties Date First Assessed: 04/19/18 Time First Assessed: 0955 Wound Type: Venous stasis ulcer Location: Ankle Location Orientation: Left;Medial Present on Admission: Yes   Dressing Type  Gauze (Comment);Impregnated gauze (bismuth);Abdominal pads;Compression wrap   2x2 impregnated with medihoney packed into wound bed    Dressing Changed  Changed    Dressing Status  Old drainage    Dressing Change Frequency  PRN    Site / Wound Assessment  Painful;Yellow    % Wound base Red or Granulating  10%    % Wound base Yellow/Fibrinous Exudate  90%    Peri-wound Assessment  Erythema (blanchable);Hemosiderin    Undermining (cm)  all edges, especially superiorly     Margins  Epibole (rolled edges)    Drainage Amount  Minimal    Drainage Description  Sanguineous    Treatment  Cleansed;Debridement (Selective);Hydrotherapy (Pulse lavage)    Pulsed lavage therapy -  wound location  ankle    Pulsed Lavage with Suction (psi)  4 psi    Pulsed Lavage with Suction - Normal Saline Used  1000 mL    Pulsed Lavage Tip  Tip with splash shield    Selective Debridement - Location  wound bed and perimeter    Selective Debridement - Tools Used  Forceps    Selective Debridement - Tissue Removed  slough/ devitalized tissue     Wound Therapy - Clinical Statement  Patient's wound continues to present with adherent slough in wound bed. Continued with pulsed lavage and sharps debridement today and more slough was able to be debrided this date however no increase in granulation tissue was noted. Packed with medihoney impregnated gauze and medihoney directly into wound. If no change in granulation next session, patient may benefit from asking MD if he feels santyl would be appropriate/beneficial. She will continue to benefit from skilled wound care to promote wound healing.     Wound Therapy - Functional Problem List  Pain when walking     Factors Delaying/Impairing Wound Healing  Infection - systemic/local;Vascular compromise    Hydrotherapy Plan  Debridement;Dressing change;Patient/family education;Pulsatile lavage with suction    Wound Therapy - Frequency  3X / week    Wound Therapy - Current Recommendations  PT    Wound Plan  Cleanse and debride wound followed by application of compression.  Measure weekly.     Dressing   medihoney to wound, , 2x2, profore.         PT Short Term Goals - 04/30/18 1610      PT SHORT TERM GOAL #1   Title  Pt pain to be no greater than a 4/10 to allow pt to be up for over an hour in comfort to complete household duties.     Time  3    Period  Weeks    Status  On-going      PT SHORT TERM GOAL #2   Title  Pt wound to be 100% granulated to decrease infection     Time  3    Period  Weeks    Status  On-going      PT SHORT TERM GOAL #3   Title  PT wound to to be approximated to 2x1.5 cm    Time  3    Period  Weeks    Status  On-going         PT Long Term Goals - 04/30/18 1610      PT LONG TERM  GOAL #1   Title  PT to have no pain in her left ankle to be able to be up for over 2 hours at a time to shop     Time  6    Period  Weeks    Status  On-going      PT LONG TERM GOAL #2   Title  Wound to be healed    Time  6    Period  Weeks    Status  On-going      PT LONG TERM GOAL #3   Title  PT to be wearing compression garment everyday.     Time  6    Period  Weeks    Status  On-going       Plan - 05/07/18 1306    Clinical Impression Statement  see above    Rehab Potential  Good    PT Frequency  3x / week    PT Duration  --   1 week followed by 2x a wk for 5 weeks    PT Treatment/Interventions  Other (comment);Compression bandaging   debridement, compression bandaging    PT Next Visit Plan  see above     Consulted and Agree with Plan of Care  Patient       Patient will benefit from skilled therapeutic intervention in order to improve the following deficits and impairments:  Increased edema, Difficulty walking, Decreased skin integrity, Pain  Visit Diagnosis: Pain in left ankle and joints of left foot  Difficulty in walking, not elsewhere classified  Ankle wound, left, sequela     Problem List Patient Active Problem List   Diagnosis Date Noted  . Unilateral primary osteoarthritis, left knee 05/02/2017  . S/P total knee replacement using cement, left 05/02/2017  . Primary osteoarthritis of left knee 12/21/2016  . Chondromalacia patellae, left knee 05/26/2016  . Breast cancer of upper-inner quadrant of right female breast ([place]) 11/04/2015  . GERD (gastroesophageal reflux disease) 09/02/2013  . Obesity 09/02/2013  . Ventral hernia, recurrent 09/02/2013  . Unspecified asthma(493.90) 12/12/2012  . Cough variant asthma 03/27/2012  . Right middle lobe syndrome 03/27/2012    Kipp Brood, PT, DPT Physical Therapist with [facility]  05/07/2018 1:06 PM    [facility] [address] [place], [place], [address] Phone: [phone]   Fax:  [phone]  Name: ADDLEY BALLINGER MRN: [id number] Date of Birth: 12/03/1945

## 2018-05-08 ENCOUNTER — Ambulatory Visit (HOSPITAL_COMMUNITY): Payer: Medicare Other | Admitting: Physical Therapy

## 2018-05-09 ENCOUNTER — Ambulatory Visit (HOSPITAL_COMMUNITY): Payer: Medicare Other | Admitting: Physical Therapy

## 2018-05-09 DIAGNOSIS — S91002S Unspecified open wound, left ankle, sequela: Secondary | ICD-10-CM | POA: Diagnosis not present

## 2018-05-09 DIAGNOSIS — R262 Difficulty in walking, not elsewhere classified: Secondary | ICD-10-CM | POA: Diagnosis not present

## 2018-05-09 DIAGNOSIS — M25572 Pain in left ankle and joints of left foot: Secondary | ICD-10-CM

## 2018-05-09 DIAGNOSIS — M25562 Pain in left knee: Secondary | ICD-10-CM | POA: Diagnosis not present

## 2018-05-09 NOTE — Therapy (Signed)
[place], [place], [address] Phone: [phone]   Fax:  [phone]  Wound Care Therapy  Patient Details  Name: Caitlin [name] MRN: [id number] Date of Birth: 1945/10/26 Referring Provider (PT): Sharilyn Sites    Encounter Date: 05/09/2018  PT End of Session - 05/09/18 1033    Visit Number  9    Number of Visits  12    Date for PT Re-Evaluation  05/28/18    Authorization Type  Medicare A & B    Authorization Time Period  9/30-->05/28/2018    Authorization - Visit Number  9    Authorization - Number of Visits  10    PT Start Time  0904    PT Stop Time  0940    PT Time Calculation (min)  36 min    Activity Tolerance  Patient tolerated treatment well    Behavior During Therapy  [facility] for tasks assessed/performed       Past Medical History:  Diagnosis Date  . Arthritis   . Asthma   . Breast cancer ([place])   . Breast disorder    cancer  . Breast mass    benign  . Cellulitis   . GERD (gastroesophageal reflux disease)   . Heart murmur    AS CHILD  OUTGREW IT  . History of hiatal hernia   . Hyperlipidemia   . Hypertension   . Impaired fasting glucose   . Rectocele   . Varicose vein of leg     Past Surgical History:  Procedure Laterality Date  . BREAST LUMPECTOMY WITH RADIOACTIVE SEED AND SENTINEL LYMPH NODE BIOPSY Right 11/11/2015   Procedure: RIGHT BREAST RADIOACTIVE SEED GUIDED  LUMPECTOMY AND RADIOACTIVE SEED TARGETED SENTINEL LYMPH NODE BIOPSY;  Surgeon: Stark Klein, MD;  Location: [place];  Service: General;  Laterality: Right;  . BREAST SURGERY Left    breast biopies x4  . CHOLECYSTECTOMY  09/08/2010  . COLONOSCOPY    . COLONOSCOPY N/A 09/25/2013   Procedure: COLONOSCOPY;  Surgeon: Rogene Houston, MD;  [facility];  Service: Endoscopy;  Laterality: N/A;  100-moved to 1200 Ann to notify pt  . HERNIA REPAIR  12/24/2010, 12-2014   TOTAL 4  . LAPAROSCOPIC GASTRIC SLEEVE  RESECTION  11-2013   at [place]  2007/2008  . TONSILLECTOMY  1968  . TOTAL KNEE ARTHROPLASTY Left 05/02/2017  . TOTAL KNEE ARTHROPLASTY Left 05/02/2017   Procedure: LEFT TOTAL KNEE ARTHROPLASTY;  Surgeon: [name], MD;  Location: [place];  Service: Orthopedics;  Laterality: Left;  . UPPER GASTROINTESTINAL ENDOSCOPY    . VAGINAL HYSTERECTOMY  1980s    There were no vitals filed for this visit.              Wound Therapy - 05/09/18 0959    Subjective  Pt states her MD sent her to the ED after last visit to determine if she had osteomyelitis and see about IV antibiotics.  ED completed MRI ruling out osteo and did not admit due to no infection.  Began on augmentin.     Pain Scale  0-10    Pain Score  0-No pain    Wound Properties Date First Assessed: 04/19/18 Time First Assessed: 0955 Wound Type: Venous stasis ulcer Location: Ankle Location Orientation: Left;Medial Present on Admission: Yes   Dressing Type  Gauze (Comment);Impregnated gauze (bismuth);Abdominal pads;Compression wrap    Dressing Changed  Changed    Dressing Status  Clean;Dry;Old drainage    Dressing Change Frequency  PRN    Site / Wound Assessment  Painful;Yellow    % Wound base Red or Granulating  15%    % Wound base Yellow/Fibrinous Exudate  85%    Peri-wound Assessment  Erythema (blanchable);Hemosiderin    Wound Length (cm)  0.8 cm    Wound Width (cm)  1 cm    Wound Depth (cm)  0.7 cm    Wound Volume (cm^3)  0.56 cm^3    Wound Surface Area (cm^2)  0.8 cm^2    Margins  Unattached edges (unapproximated)    Drainage Amount  Minimal    Drainage Description  Sanguineous    Treatment  Cleansed;Debridement (Selective)    Selective Debridement - Location  wound bed and perimeter    Selective Debridement - Tools Used  Forceps;Scalpel    Selective Debridement - Tissue Removed  slough/ devitalized tissue     Wound Therapy - Clinical Statement  Pt returns with previous  trip to ED per MD request.  MRI' ruled out osteo and began on augmentin.  Noted swelling around ankle and dorsum of foot but minimal redness, no infection.  Cleansed LE well, moisturized and continued packing with medihoney gel gauze and profore.      Wound Therapy - Functional Problem List  Pain when walking     Factors Delaying/Impairing Wound Healing  Infection - systemic/local;Vascular compromise    Hydrotherapy Plan  Debridement;Dressing change;Patient/family education;Pulsatile lavage with suction    Wound Therapy - Frequency  3X / week    Wound Therapy - Current Recommendations  PT    Wound Plan  Cleanse and debride wound followed by application of compression.  Measure weekly.                 PT Short Term Goals - 04/30/18 1610      PT SHORT TERM GOAL #1   Title  Pt pain to be no greater than a 4/10 to allow pt to be up for over an hour in comfort to complete household duties.     Time  3    Period  Weeks    Status  On-going      PT SHORT TERM GOAL #2   Title  Pt wound to be 100% granulated to decrease infection     Time  3    Period  Weeks    Status  On-going      PT SHORT TERM GOAL #3   Title  PT wound to to be approximated to 2x1.5 cm    Time  3    Period  Weeks    Status  On-going        PT Long Term Goals - 04/30/18 1610      PT LONG TERM GOAL #1   Title  PT to have no pain in her left ankle to be able to be up for over 2 hours at a time to shop     Time  6    Period  Weeks    Status  On-going      PT LONG TERM GOAL #2   Title  Wound to be healed    Time  6    Period  Weeks    Status  On-going      PT LONG TERM GOAL #3   Title  PT to be wearing compression garment everyday.     Time  6  Period  Weeks    Status  On-going              Patient will benefit from skilled therapeutic intervention in order to improve the following deficits and impairments:     Visit Diagnosis: Pain in left ankle and joints of left foot  Difficulty in  walking, not elsewhere classified  Ankle wound, left, sequela     Problem List Patient Active Problem List   Diagnosis Date Noted  . Unilateral primary osteoarthritis, left knee 05/02/2017  . S/P total knee replacement using cement, left 05/02/2017  . Primary osteoarthritis of left knee 12/21/2016  . Chondromalacia patellae, left knee 05/26/2016  . Breast cancer of upper-inner quadrant of right female breast ([place]) 11/04/2015  . GERD (gastroesophageal reflux disease) 09/02/2013  . Obesity 09/02/2013  . Ventral hernia, recurrent 09/02/2013  . Unspecified asthma(493.90) 12/12/2012  . Cough variant asthma 03/27/2012  . Right middle lobe syndrome 03/27/2012   Teena Irani, PTA/CLT [phone]  Teena Irani 05/09/2018, 10:34 AM  [place], [place], [address] Phone: [phone]   Fax:  [phone]  Name: Caitlin [name] MRN: [id number] Date of Birth: 1945/08/28

## 2018-05-11 ENCOUNTER — Ambulatory Visit (HOSPITAL_COMMUNITY): Payer: Medicare Other

## 2018-05-11 ENCOUNTER — Encounter (HOSPITAL_COMMUNITY): Payer: Self-pay

## 2018-05-11 DIAGNOSIS — M25572 Pain in left ankle and joints of left foot: Secondary | ICD-10-CM

## 2018-05-11 DIAGNOSIS — R262 Difficulty in walking, not elsewhere classified: Secondary | ICD-10-CM

## 2018-05-11 DIAGNOSIS — S91002S Unspecified open wound, left ankle, sequela: Secondary | ICD-10-CM

## 2018-05-11 DIAGNOSIS — M25562 Pain in left knee: Secondary | ICD-10-CM | POA: Diagnosis not present

## 2018-05-11 NOTE — Therapy (Signed)
[place], [place], [address] Phone: [phone]   Fax:  [phone]  Wound Care Therapy  Patient Details  Name: Caitlin [name] MRN: [id number] Date of Birth: 11-20-1945 Referring Provider (PT): Sharilyn Sites    Encounter Date: 05/11/2018  PT End of Session - 05/11/18 1227    Visit Number  10    Number of Visits  12    Date for PT Re-Evaluation  05/28/18    Authorization Type  Medicare A & B    Authorization Time Period  9/30-->05/28/2018    Authorization - Visit Number  10    Authorization - Number of Visits  10    PT Start Time  0903    PT Stop Time  0945    PT Time Calculation (min)  42 min    Activity Tolerance  Patient tolerated treatment well    Behavior During Therapy  [facility] for tasks assessed/performed       Past Medical History:  Diagnosis Date  . Arthritis   . Asthma   . Breast cancer ([place])   . Breast disorder    cancer  . Breast mass    benign  . Cellulitis   . GERD (gastroesophageal reflux disease)   . Heart murmur    AS CHILD  OUTGREW IT  . History of hiatal hernia   . Hyperlipidemia   . Hypertension   . Impaired fasting glucose   . Rectocele   . Varicose vein of leg     Past Surgical History:  Procedure Laterality Date  . BREAST LUMPECTOMY WITH RADIOACTIVE SEED AND SENTINEL LYMPH NODE BIOPSY Right 11/11/2015   Procedure: RIGHT BREAST RADIOACTIVE SEED GUIDED  LUMPECTOMY AND RADIOACTIVE SEED TARGETED SENTINEL LYMPH NODE BIOPSY;  Surgeon: Stark Klein, MD;  Location: [place];  Service: General;  Laterality: Right;  . BREAST SURGERY Left    breast biopies x4  . CHOLECYSTECTOMY  09/08/2010  . COLONOSCOPY    . COLONOSCOPY N/A 09/25/2013   Procedure: COLONOSCOPY;  Surgeon: Rogene Houston, MD;  [facility];  Service: Endoscopy;  Laterality: N/A;  100-moved to 1200 Ann to notify pt  . HERNIA REPAIR  12/24/2010, 12-2014   TOTAL 4  . LAPAROSCOPIC GASTRIC SLEEVE  RESECTION  11-2013   at [place]  2007/2008  . TONSILLECTOMY  1968  . TOTAL KNEE ARTHROPLASTY Left 05/02/2017  . TOTAL KNEE ARTHROPLASTY Left 05/02/2017   Procedure: LEFT TOTAL KNEE ARTHROPLASTY;  Surgeon: [name], MD;  Location: [place];  Service: Orthopedics;  Laterality: Left;  . UPPER GASTROINTESTINAL ENDOSCOPY    . VAGINAL HYSTERECTOMY  1980s    There were no vitals filed for this visit.   Subjective Assessment - 05/11/18 1218    Subjective  Pt arrived wiht dressings intact, no reports of pain today, does c/o some itching                Wound Therapy - 05/11/18 1219    Subjective  Pt arrived wiht dressings intact, no reports of pain today, does c/o some itching    Patient and Family Stated Goals  wound to heal and pain to be gone     Date of Onset  04/05/18    Prior Treatments  medication and self care.     Pain Scale  0-10    Evaluation and Treatment Procedures Explained to Patient/Family  Yes  Evaluation and Treatment Procedures  agreed to    Wound Properties Date First Assessed: 04/19/18 Time First Assessed: 0955 Wound Type: Venous stasis ulcer Location: Ankle Location Orientation: Left;Medial Present on Admission: Yes   Dressing Type  Gauze (Comment);Abdominal pads;Compression wrap   medihoney gauze, 2x2, profore   Dressing Changed  Changed    Dressing Status  Clean;Dry;Old drainage    Dressing Change Frequency  PRN    Site / Wound Assessment  Painful;Yellow    % Wound base Red or Granulating  15%    % Wound base Yellow/Fibrinous Exudate  85%    % Wound base Black/Eschar  0%    Peri-wound Assessment  Erythema (blanchable);Hemosiderin    Margins  Unattached edges (unapproximated)    Drainage Amount  Minimal    Drainage Description  Sanguineous    Treatment  Cleansed;Debridement (Selective);Hydrotherapy (Pulse lavage)    Pulsed lavage therapy - wound location  ankle    Pulsed Lavage with Suction (psi)  4 psi     Pulsed Lavage with Suction - Normal Saline Used  1000 mL    Pulsed Lavage Tip  Tip with splash shield    Selective Debridement - Location  wound bed and perimeter    Selective Debridement - Tools Used  Forceps;Scalpel    Selective Debridement - Tissue Removed  slough/ devitalized tissue    qtips   Wound Therapy - Clinical Statement  Pt reports change in antibiotics with no side effects.  Improved skin integrity perimeter of wound.  Continues to have adherent slough in wound bed.  Selective debridement for removal of slough and resumed PLS for cleansing.  Continued wihth medihoney gauze and profore wrap.  Pt reports she has ordered new pair of compression garmets.      Wound Therapy - Functional Problem List  Pain when walking     Factors Delaying/Impairing Wound Healing  Infection - systemic/local;Vascular compromise    Hydrotherapy Plan  Debridement;Dressing change;Patient/family education;Pulsatile lavage with suction    Wound Therapy - Frequency  3X / week    Wound Therapy - Current Recommendations  PT    Wound Plan  Cleanse and debride wound followed by application of compression.  Measure weekly.     Dressing   medihoney to wound, , 2x2, profore.                 PT Short Term Goals - 04/30/18 1610      PT SHORT TERM GOAL #1   Title  Pt pain to be no greater than a 4/10 to allow pt to be up for over an hour in comfort to complete household duties.     Time  3    Period  Weeks    Status  On-going      PT SHORT TERM GOAL #2   Title  Pt wound to be 100% granulated to decrease infection     Time  3    Period  Weeks    Status  On-going      PT SHORT TERM GOAL #3   Title  PT wound to to be approximated to 2x1.5 cm    Time  3    Period  Weeks    Status  On-going        PT Long Term Goals - 04/30/18 1610      PT LONG TERM GOAL #1   Title  PT to have no pain in her left ankle to be able to be up for over  2 hours at a time to shop     Time  6    Period  Weeks     Status  On-going      PT LONG TERM GOAL #2   Title  Wound to be healed    Time  6    Period  Weeks    Status  On-going      PT LONG TERM GOAL #3   Title  PT to be wearing compression garment everyday.     Time  6    Period  Weeks    Status  On-going              Patient will benefit from skilled therapeutic intervention in order to improve the following deficits and impairments:     Visit Diagnosis: Pain in left ankle and joints of left foot  Difficulty in walking, not elsewhere classified  Ankle wound, left, sequela     Problem List Patient Active Problem List   Diagnosis Date Noted  . Unilateral primary osteoarthritis, left knee 05/02/2017  . S/P total knee replacement using cement, left 05/02/2017  . Primary osteoarthritis of left knee 12/21/2016  . Chondromalacia patellae, left knee 05/26/2016  . Breast cancer of upper-inner quadrant of right female breast ([place]) 11/04/2015  . GERD (gastroesophageal reflux disease) 09/02/2013  . Obesity 09/02/2013  . Ventral hernia, recurrent 09/02/2013  . Unspecified asthma(493.90) 12/12/2012  . Cough variant asthma 03/27/2012  . Right middle lobe syndrome 03/27/2012   Ihor Austin, LPTA; [place]  Aldona Lento 05/11/2018, 12:28 PM  [place] [address] [place], [place], [address] Phone: [phone]   Fax:  [phone]  Name: Caitlin [name] MRN: [id number] Date of Birth: 02-29-1946

## 2018-05-13 LAB — CULTURE, BLOOD (ROUTINE X 2)
CULTURE: NO GROWTH
Culture: NO GROWTH
SPECIAL REQUESTS: ADEQUATE
SPECIAL REQUESTS: ADEQUATE

## 2018-05-14 ENCOUNTER — Encounter (HOSPITAL_COMMUNITY): Payer: Self-pay

## 2018-05-14 ENCOUNTER — Ambulatory Visit (HOSPITAL_COMMUNITY): Payer: Medicare Other

## 2018-05-14 ENCOUNTER — Other Ambulatory Visit: Payer: Self-pay

## 2018-05-14 DIAGNOSIS — M25562 Pain in left knee: Secondary | ICD-10-CM | POA: Diagnosis not present

## 2018-05-14 DIAGNOSIS — R262 Difficulty in walking, not elsewhere classified: Secondary | ICD-10-CM

## 2018-05-14 DIAGNOSIS — S91002S Unspecified open wound, left ankle, sequela: Secondary | ICD-10-CM | POA: Diagnosis not present

## 2018-05-14 DIAGNOSIS — M25572 Pain in left ankle and joints of left foot: Secondary | ICD-10-CM | POA: Diagnosis not present

## 2018-05-14 NOTE — Therapy (Signed)
[place], [place], [address] Phone: [phone]   Fax:  [phone]  Wound Care Therapy  Patient Details  Name: Caitlin [name] MRN: [id number] Date of Birth: Jan 06, 1946 Referring Provider (PT): Sharilyn Sites    Encounter Date: 05/14/2018  PT End of Session - 05/14/18 1230    Visit Number  11    Number of Visits  12    Date for PT Re-Evaluation  05/28/18    Authorization Type  Medicare A & B    Authorization Time Period  9/30-->05/28/2018    Authorization - Visit Number  1    Authorization - Number of Visits  10    PT Start Time  0905    PT Stop Time  0948    PT Time Calculation (min)  43 min    Activity Tolerance  Patient tolerated treatment well    Behavior During Therapy  [facility] for tasks assessed/performed       Past Medical History:  Diagnosis Date  . Arthritis   . Asthma   . Breast cancer ([place])   . Breast disorder    cancer  . Breast mass    benign  . Cellulitis   . GERD (gastroesophageal reflux disease)   . Heart murmur    AS CHILD  OUTGREW IT  . History of hiatal hernia   . Hyperlipidemia   . Hypertension   . Impaired fasting glucose   . Rectocele   . Varicose vein of leg     Past Surgical History:  Procedure Laterality Date  . BREAST LUMPECTOMY WITH RADIOACTIVE SEED AND SENTINEL LYMPH NODE BIOPSY Right 11/11/2015   Procedure: RIGHT BREAST RADIOACTIVE SEED GUIDED  LUMPECTOMY AND RADIOACTIVE SEED TARGETED SENTINEL LYMPH NODE BIOPSY;  Surgeon: Stark Klein, MD;  Location: [place];  Service: General;  Laterality: Right;  . BREAST SURGERY Left    breast biopies x4  . CHOLECYSTECTOMY  09/08/2010  . COLONOSCOPY    . COLONOSCOPY N/A 09/25/2013   Procedure: COLONOSCOPY;  Surgeon: Rogene Houston, MD;  [facility];  Service: Endoscopy;  Laterality: N/A;  100-moved to 1200 Ann to notify pt  . HERNIA REPAIR  12/24/2010, 12-2014   TOTAL 4  . LAPAROSCOPIC GASTRIC SLEEVE  RESECTION  11-2013   at [place]  2007/2008  . TONSILLECTOMY  1968  . TOTAL KNEE ARTHROPLASTY Left 05/02/2017  . TOTAL KNEE ARTHROPLASTY Left 05/02/2017   Procedure: LEFT TOTAL KNEE ARTHROPLASTY;  Surgeon: [name], MD;  Location: [place];  Service: Orthopedics;  Laterality: Left;  . UPPER GASTROINTESTINAL ENDOSCOPY    . VAGINAL HYSTERECTOMY  1980s    There were no vitals filed for this visit.              Wound Therapy - 05/14/18 1230    Subjective  Patient reports she had a nice weekend and walked a lot at a family wedding she attended. She denies pain today.     Patient and Family Stated Goals  wound to heal and pain to be gone     Date of Onset  04/05/18    Prior Treatments  medication and self care.     Pain Scale  0-10    Pain Score  0-No pain    Evaluation and Treatment Procedures Explained to Patient/Family  Yes    Evaluation and Treatment Procedures  agreed to    Wound Properties  Date First Assessed: 04/19/18 Time First Assessed: 0955 Wound Type: Venous stasis ulcer Location: Ankle Location Orientation: Left;Medial Present on Admission: Yes   Dressing Type  Gauze (Comment);Abdominal pads;Compression wrap   medihoney gauze, 2x2, profore   Dressing Changed  Changed    Dressing Status  Clean;Dry;Old drainage    Dressing Change Frequency  PRN    Site / Wound Assessment  Painful;Yellow    % Wound base Red or Granulating  15%    % Wound base Yellow/Fibrinous Exudate  85%    % Wound base Black/Eschar  0%    Peri-wound Assessment  Erythema (blanchable);Hemosiderin    Margins  Unattached edges (unapproximated)    Drainage Amount  Minimal    Drainage Description  Sanguineous    Pulsed lavage therapy - wound location  ankle    Pulsed Lavage with Suction (psi)  4 psi    Pulsed Lavage with Suction - Normal Saline Used  1000 mL    Pulsed Lavage Tip  Tip with splash shield    Selective Debridement - Location  wound bed and  perimeter    Selective Debridement - Tools Used  Forceps;Scalpel    Selective Debridement - Tissue Removed  slough/ devitalized tissue    qtips   Wound Therapy - Clinical Statement  Periwound integrity is greatly improved and no maceration is present. Continued with pulsed lavage today and selective debridement following as patient has ongoing adherent slough in wound bed. Some granulation tissue is now visible in wound bed this date. Continued with medihoney and guaze for packing, and profore wrap. Patient was wearing stocking on Rt LE this date and planning to check at apothecary for new stockings if they have thigh high, as hers are old and roll down. If she requires a prescription for stocking I told her we can request one from her MD. She will continue to benefit from skilled wound care to improve healing environment and prevent infection.    Wound Therapy - Functional Problem List  Pain when walking     Factors Delaying/Impairing Wound Healing  Infection - systemic/local;Vascular compromise    Hydrotherapy Plan  Debridement;Dressing change;Patient/family education;Pulsatile lavage with suction    Wound Therapy - Frequency  3X / week    Wound Therapy - Current Recommendations  PT    Wound Plan  Cleanse and debride wound followed by application of compression.  Measure weekly.     Dressing   medihoney to wound, , 2x2, profore.          PT Short Term Goals - 04/30/18 1610      PT SHORT TERM GOAL #1   Title  Pt pain to be no greater than a 4/10 to allow pt to be up for over an hour in comfort to complete household duties.     Time  3    Period  Weeks    Status  On-going      PT SHORT TERM GOAL #2   Title  Pt wound to be 100% granulated to decrease infection     Time  3    Period  Weeks    Status  On-going      PT SHORT TERM GOAL #3   Title  PT wound to to be approximated to 2x1.5 cm    Time  3    Period  Weeks    Status  On-going        PT Long Term Goals - 04/30/18 1610  PT LONG TERM GOAL #1   Title  PT to have no pain in her left ankle to be able to be up for over 2 hours at a time to shop     Time  6    Period  Weeks    Status  On-going      PT LONG TERM GOAL #2   Title  Wound to be healed    Time  6    Period  Weeks    Status  On-going      PT LONG TERM GOAL #3   Title  PT to be wearing compression garment everyday.     Time  6    Period  Weeks    Status  On-going          Plan - 05/14/18 1230    Clinical Impression Statement  see above    Rehab Potential  Good    PT Frequency  3x / week    PT Duration  --   1 week followed by 2x a wk for 5 weeks    PT Treatment/Interventions  Other (comment);Compression bandaging   debridement, compression bandaging    PT Next Visit Plan  see above     Consulted and Agree with Plan of Care  Patient       Patient will benefit from skilled therapeutic intervention in order to improve the following deficits and impairments:  Increased edema, Difficulty walking, Decreased skin integrity, Pain  Visit Diagnosis: Pain in left ankle and joints of left foot  Difficulty in walking, not elsewhere classified  Ankle wound, left, sequela     Problem List Patient Active Problem List   Diagnosis Date Noted  . Unilateral primary osteoarthritis, left knee 05/02/2017  . S/P total knee replacement using cement, left 05/02/2017  . Primary osteoarthritis of left knee 12/21/2016  . Chondromalacia patellae, left knee 05/26/2016  . Breast cancer of upper-inner quadrant of right female breast ([place]) 11/04/2015  . GERD (gastroesophageal reflux disease) 09/02/2013  . Obesity 09/02/2013  . Ventral hernia, recurrent 09/02/2013  . Unspecified asthma(493.90) 12/12/2012  . Cough variant asthma 03/27/2012  . Right middle lobe syndrome 03/27/2012    Kipp Brood, PT, DPT Physical Therapist with [facility]  05/14/2018 12:32 PM    [place] [address] [place], [place], [address] Phone: [phone]   Fax:  [phone]  Name: Caitlin [name] MRN: [id number] Date of Birth: 31-Aug-1945

## 2018-05-15 ENCOUNTER — Other Ambulatory Visit (INDEPENDENT_AMBULATORY_CARE_PROVIDER_SITE_OTHER): Payer: Medicare Other

## 2018-05-15 ENCOUNTER — Ambulatory Visit (INDEPENDENT_AMBULATORY_CARE_PROVIDER_SITE_OTHER): Payer: Medicare Other | Admitting: Internal Medicine

## 2018-05-15 ENCOUNTER — Encounter: Payer: Self-pay | Admitting: Internal Medicine

## 2018-05-15 VITALS — BP 118/70 | HR 80 | Ht 62.0 in | Wt 201.8 lb

## 2018-05-15 DIAGNOSIS — J45991 Cough variant asthma: Secondary | ICD-10-CM | POA: Diagnosis not present

## 2018-05-15 LAB — CBC WITH DIFFERENTIAL/PLATELET
BASOS PCT: 0.9 % (ref 0.0–3.0)
Basophils Absolute: 0.1 10*3/uL (ref 0.0–0.1)
Eosinophils Absolute: 0.3 10*3/uL (ref 0.0–0.7)
Eosinophils Relative: 4.3 % (ref 0.0–5.0)
HEMATOCRIT: 34.1 % — AB (ref 36.0–46.0)
HEMOGLOBIN: 11.4 g/dL — AB (ref 12.0–15.0)
Lymphocytes Relative: 21.8 % (ref 12.0–46.0)
Lymphs Abs: 1.4 10*3/uL (ref 0.7–4.0)
MCHC: 33.5 g/dL (ref 30.0–36.0)
MCV: 98.2 fl (ref 78.0–100.0)
MONO ABS: 0.6 10*3/uL (ref 0.1–1.0)
MONOS PCT: 10.2 % (ref 3.0–12.0)
Neutro Abs: 3.9 10*3/uL (ref 1.4–7.7)
Neutrophils Relative %: 62.8 % (ref 43.0–77.0)
Platelets: 268 10*3/uL (ref 150.0–400.0)
RBC: 3.47 Mil/uL — AB (ref 3.87–5.11)
RDW: 13.6 % (ref 11.5–15.5)
WBC: 6.3 10*3/uL (ref 4.0–10.5)

## 2018-05-15 MED ORDER — BUDESONIDE-FORMOTEROL FUMARATE 80-4.5 MCG/ACT IN AERO: INHALATION_SPRAY | RESPIRATORY_TRACT | 11 refills | Status: DC

## 2018-05-15 MED ORDER — BUDESONIDE-FORMOTEROL FUMARATE 80-4.5 MCG/ACT IN AERO: 2.0000 | INHALATION_SPRAY | Freq: Two times a day (BID) | RESPIRATORY_TRACT | 2 refills | Status: DC

## 2018-05-15 NOTE — Progress Notes (Signed)
Caitlin [name], female    DOB: 1946/01/08,    MRN: [id number]    Brief patient profile:  23 yowf never smoker asthma as child outgrew it about age teens then some seasonal sinus problems since the 1990s  but around 2007 developed recurrent pattern of breathing problems esp in spring dx as asthma > asmanex and singulair seemed to work ok until around 2012 when had her 1st hernia surgery and since then symptoms persistent year round so 03/27/2012 referred to pulmonary clinic     History of Present Illness  03/27/2012 1st pulmonary ov / Bobetta Korf cc > one year daily p stirs in am (but doesn't wake her) cc sense of  congestion/variable pattern prod of thick sputum/ variable color better p neb with alb/pulmicort.  Sinus spring > fall and not an active problem. Never rx with prednisone. No gagging, some urination.  All started p Feb 2012 p lap chole and several other procedures and coughs so hard it's causing a problem with the wound healing resulting in worsening hernia and has been told she will need more surgery. rec Nexium 40 mg Take 30-60 min before first meal of the day and Pepcid 20 mg one at bedtime until return mucinex dm for cough 2 every 12 hours GERD   Prednisone 10 mg take  4 each am x 2 days,   2 each am x 2 days,  1 each am x2days and stop  Stop asmanex and continue pulmicort with albuterol twice daily > resolved short term         03/05/2013 f/u ov/Roslind Michaux re asthma     Chief Complaint  Patient presents with  . Follow-up    Breathing has improved.  No SOB, wheezing, chest tightenss, chest pain, or cough noted at this time.  On  symbicort 160 one twice daily also singulair and never nebulizer any more rec Ok to stop singulair Plan A = automatic = Symbicort 160 Take 1 puffs first thing in am and then another 2 puffs about 12 hours later - if doing great off singulair in a couple of weeks then ok to stop the symbicort too - it will start working in 5 min if needed  Plan B =  backup Only use your albuterol as a rescue medication to be used For cough > mucinex dm 1200 every 12 hours as needed  For cough/ congestion > mucinex as needed     04/13/2018  Pulmonary/ re-establish re recurrent cough  Chief Complaint  Patient presents with  . Pulmonary Consult    Referred by Dr. Hilma Favors. Pt c/o cough for the past months- occ prod with green to yellow sputum.    couple times a year tends to flare after stops meds/ never restarted maint symb/ takes advair dpi "prn"  S/p gastric sleeve 2015 got down to 172 but slowly increasing S/p R breast ca/ rad on Right side only completed July 2017  Cough x sev months x  Daytime  Maybe worse in heat/ not after meals or hs  Not limited by breathing from desired activities  Presently on pred/ abx for L leg rash ? Cellulitis rec Nexium 40 mg Take 30-60 min before first meal of the day plus  pepcid 20 mg after supper  Symbicort 80 Take 2 puffs first thing in am and then another 2 puffs about 12 hours later.  Work on inhaler technique:  relax and gently blow all the way out then take a nice smooth deep breath  back in, triggering the inhaler at same time you start breathing in.  Hold for up to 5 seconds if you can. Blow out thru nose. Rinse and gargle with water when done For cough > delsym 2 tsp every 12 hours  GERD  Diet    05/15/2018  f/u ov/Izela Altier re:  Cough variant asthma all symptoms resolved on symb 80 2bid  Chief Complaint  Patient presents with  . Follow-up    Doing better cough is gone feels symbicort is working but it is expensive.    Dyspnea:  Not limited by breathing from desired activities   Cough: resolved  Sleeping: flat/ one pillw SABA use: none 02: none   No obvious day to day or daytime variability or assoc excess/ purulent sputum or mucus plugs or hemoptysis or cp or chest tightness, subjective wheeze or overt sinus or hb symptoms.   Sleeping as above  without nocturnal  or early am exacerbation  of  respiratory  c/o's or need for noct saba. Also denies any obvious fluctuation of symptoms with weather or environmental changes or other aggravating or alleviating factors except as outlined above   No unusual exposure hx or h/o childhood pna/ asthma or knowledge of premature birth.  Current Allergies, Complete Past Medical History, Past Surgical History, Family History, and Social History were reviewed in [employer] record.  ROS  The following are not active complaints unless bolded Hoarseness, sore throat, dysphagia, dental problems, itching, sneezing,  nasal congestion or discharge of excess mucus or purulent secretions, ear ache,   fever, chills, sweats, unintended wt loss or wt gain, classically pleuritic or exertional cp,  orthopnea pnd or arm/hand swelling  or leg swelling, presyncope, palpitations, abdominal pain, anorexia, nausea, vomiting, diarrhea  or change in bowel habits or change in bladder habits, change in stools or change in urine, dysuria, hematuria,  rash, arthralgias, visual complaints, headache, numbness, weakness or ataxia or problems with walking or coordination,  change in mood or  memory.        Current Meds  Medication Sig  . amoxicillin-clavulanate (AUGMENTIN) 875-125 MG tablet Take 1 tablet by mouth 2 (two) times daily. One po bid x 7 days  . anastrozole (ARIMIDEX) 1 MG tablet Take 1 tablet (1 mg total) by mouth daily.  . B Complex Vitamins (B-COMPLEX/B-12 SL) Place 1 tablet under the tongue daily.   . Calcium Citrate-Vitamin D (CALCIUM CITRATE CHEWY BITE) 500-500 MG-UNIT CHEW Chew 500 mg by mouth 2 (two) times daily.   [name] docusate sodium (COLACE) 100 MG capsule Take 100 mg by mouth at bedtime.   [name] esomeprazole (NEXIUM) 20 MG capsule Take 40 mg by mouth daily before breakfast.   . famotidine (PEPCID) 20 MG tablet Take 20 mg by mouth at bedtime.   . Iron-Vitamin C (IRON 100/C PO) Take 1 tablet by mouth daily. Celebrate iron + vit C  .  losartan-hydrochlorothiazide (HYZAAR) 100-25 MG tablet Take 1 tablet by mouth daily.  . Magnesium 250 MG TABS Take 500 mg by mouth every evening.  [name] MELATONIN PO Take 1 tablet by mouth at bedtime as needed (sleep).  . Menaquinone-7 (VITAMIN K2) 100 MCG CAPS Take 100 mcg by mouth every evening.   . Multiple Vitamin (MULTIVITAMIN) capsule Take 1 capsule by mouth 2 (two) times daily with a meal. Celebrate Bariatric MVI  . pravastatin (PRAVACHOL) 20 MG tablet Take 20 mg by mouth at bedtime.   Dellis Anes) 80-4.5 MCG/ACT inhaler Inhale 2 puffs  2 (two) times daily.                       Objective:      amb wf no longer rattling cough   05/15/2018     202   04/13/18 208 lb (94.3 kg)  04/07/18 205 lb (93 kg)  02/12/18 205 lb (93 kg)     Vital signs reviewed - Note on arrival 02 sats  100% on RA         HEENT: nl dentition, turbinates bilaterally, and oropharynx. Nl external ear canals without cough reflex   NECK :  without JVD/Nodes/TM/ nl carotid upstrokes bilaterally   LUNGS: no acc muscle use,  Nl contour chest which is clear to A and P bilaterally without cough on insp or exp maneuvers   CV:  RRR  no s3 or murmur or increase in P2, and no edema   ABD:  soft and nontender with nl inspiratory excursion in the supine position. No bruits or organomegaly appreciated, bowel sounds nl  MS:  Nl gait/ ext warm without deformities, calf tenderness, cyanosis or clubbing No obvious joint restrictions   SKIN: warm and dry without lesions    NEURO:  alert, approp, nl sensorium with  no motor or cerebellar deficits apparent.     Labs ordered 05/15/2018   Allergy profile     Assessment

## 2018-05-15 NOTE — Patient Instructions (Addendum)
Ok to use symbicort 80 up to 2 pffs every 12hours as needed to control your respiratory symptoms   If after a week if not 100% better then we need to see you right away  - bring your formulary with you from your insurance    Please remember to go to the lab department downstairs in the basement  for your tests - we will call you with the results when they are available.       If you are satisfied with your treatment plan,  let your doctor know and he/she can either refill your medications or you can return here when your prescription runs out.     If in any way you are not 100% satisfied,  please tell us.  If 100% better, tell your friends!  Pulmonary follow up is as needed

## 2018-05-16 ENCOUNTER — Ambulatory Visit (HOSPITAL_COMMUNITY): Payer: Medicare Other

## 2018-05-16 ENCOUNTER — Encounter (HOSPITAL_COMMUNITY): Payer: Self-pay

## 2018-05-16 ENCOUNTER — Encounter: Payer: Self-pay | Admitting: Internal Medicine

## 2018-05-16 ENCOUNTER — Other Ambulatory Visit: Payer: Self-pay

## 2018-05-16 DIAGNOSIS — M25572 Pain in left ankle and joints of left foot: Secondary | ICD-10-CM

## 2018-05-16 DIAGNOSIS — S91002S Unspecified open wound, left ankle, sequela: Secondary | ICD-10-CM | POA: Diagnosis not present

## 2018-05-16 DIAGNOSIS — R262 Difficulty in walking, not elsewhere classified: Secondary | ICD-10-CM | POA: Diagnosis not present

## 2018-05-16 DIAGNOSIS — M25562 Pain in left knee: Secondary | ICD-10-CM | POA: Diagnosis not present

## 2018-05-16 LAB — RESPIRATORY ALLERGY PROFILE REGION II ~~LOC~~
ALLERGEN, CEDAR TREE, T6: 0.15 kU/L — AB
ALLERGEN, OAK, T7: 0.15 kU/L — AB
Allergen, A. alternata, m6: <0.1 kU/L
Allergen, Comm Silver Birch, t9: 0.12 kU/L — ABNORMAL HIGH
Allergen, Cottonwood, t14: 0.19 kU/L — ABNORMAL HIGH
Allergen, D pternoyssinus,d7: 1.82 kU/L — ABNORMAL HIGH
Allergen, Mouse Urine Protein, e78: <0.1 kU/L
Bermuda Grass: <0.1 kU/L
Box Elder IgE: 0.11 kU/L — ABNORMAL HIGH
CLADOSPORIUM HERBARUM (M2) IGE: 0.12 kU/L — ABNORMAL HIGH
CLASS: 0
CLASS: 0
CLASS: 0
CLASS: 0
CLASS: 0
CLASS: 0
CLASS: 0
CLASS: 0
CLASS: 0
CLASS: 0
CLASS: 0
CLASS: 0
CLASS: 0
CLASS: 0
CLASS: 1
COMMON RAGWEED (SHORT) (W1) IGE: 0.39 kU/L — ABNORMAL HIGH
Cat Dander: <0.1 kU/L
Class: 0
Class: 0
Class: 0
Class: 0
Class: 0
Class: 0
Class: 0
Class: 2
Class: 2
D. farinae: 0.82 kU/L — ABNORMAL HIGH
DOG DANDER: 0.12 kU/L — AB
Elm IgE: 0.18 kU/L — ABNORMAL HIGH
IgE (Immunoglobulin E), Serum: 1227 kU/L — ABNORMAL HIGH (ref <=114)
Johnson Grass: 0.17 kU/L — ABNORMAL HIGH
Pecan/Hickory Tree IgE: 0.16 kU/L — ABNORMAL HIGH
Sheep Sorrel IgE: 0.11 kU/L — ABNORMAL HIGH
Timothy Grass: 0.12 kU/L — ABNORMAL HIGH

## 2018-05-16 LAB — INTERPRETATION:

## 2018-05-16 NOTE — Therapy (Signed)
[facility] [place], [place], [address] Phone: [phone]   Fax:  [phone]  Wound Care Therapy  Patient Details  Name: Caitlin [name] MRN: [id number] Date of Birth: 1945-11-21 Referring Provider (PT): Sharilyn Sites    Encounter Date: 05/16/2018  PT End of Session - 05/16/18 1116    Visit Number  12    Number of Visits  18    Date for PT Re-Evaluation  05/28/18    Authorization Type  Medicare A & B    Authorization Time Period  9/30-->05/28/2018    Authorization - Visit Number  2    Authorization - Number of Visits  10    PT Start Time  0905    PT Stop Time  0949    PT Time Calculation (min)  44 min    Activity Tolerance  Patient tolerated treatment well    Behavior During Therapy  [facility] for tasks assessed/performed       Past Medical History:  Diagnosis Date  . Arthritis   . Asthma   . Breast cancer ([place])   . Breast disorder    cancer  . Breast mass    benign  . Cellulitis   . GERD (gastroesophageal reflux disease)   . Heart murmur    AS CHILD  OUTGREW IT  . History of hiatal hernia   . Hyperlipidemia   . Hypertension   . Impaired fasting glucose   . Rectocele   . Varicose vein of leg     Past Surgical History:  Procedure Laterality Date  . BREAST LUMPECTOMY WITH RADIOACTIVE SEED AND SENTINEL LYMPH NODE BIOPSY Right 11/11/2015   Procedure: RIGHT BREAST RADIOACTIVE SEED GUIDED  LUMPECTOMY AND RADIOACTIVE SEED TARGETED SENTINEL LYMPH NODE BIOPSY;  Surgeon: Stark Klein, MD;  Location: [facility];  Service: General;  Laterality: Right;  . BREAST SURGERY Left    breast biopies x4  . CHOLECYSTECTOMY  09/08/2010  . COLONOSCOPY    . COLONOSCOPY N/A 09/25/2013   Procedure: COLONOSCOPY;  Surgeon: Rogene Houston, MD;  [facility];  Service: Endoscopy;  Laterality: N/A;  100-moved to 1200 Ann to notify pt  . HERNIA REPAIR  12/24/2010, 12-2014   TOTAL 4  . LAPAROSCOPIC GASTRIC SLEEVE  RESECTION  11-2013   at [place]  2007/2008  . TONSILLECTOMY  1968  . TOTAL KNEE ARTHROPLASTY Left 05/02/2017  . TOTAL KNEE ARTHROPLASTY Left 05/02/2017   Procedure: LEFT TOTAL KNEE ARTHROPLASTY;  Surgeon: [name], MD;  Location: [facility];  Service: Orthopedics;  Laterality: Left;  . UPPER GASTROINTESTINAL ENDOSCOPY    . VAGINAL HYSTERECTOMY  1980s    There were no vitals filed for this visit.    Wound Therapy - 05/16/18 1109    Subjective  Patient reports that she has itching around her heel and she thinks her skin gets dry there. She denies pain this date.     Patient and Family Stated Goals  wound to heal and pain to be gone     Date of Onset  04/05/18    Prior Treatments  medication and self care.     Pain Scale  0-10    Pain Score  0-No pain    Evaluation and Treatment Procedures Explained to Patient/Family  Yes    Evaluation and Treatment Procedures  agreed to    Wound Properties Date First Assessed: 04/19/18 Time First Assessed: 2585 Wound  Type: Venous stasis ulcer Location: Ankle Location Orientation: Left;Medial Present on Admission: Yes   Dressing Type  Gauze (Comment);Abdominal pads;Compression wrap   medihoney gauze, 2x2, profore   Dressing Changed  Changed    Dressing Status  Clean;Dry;Old drainage    Dressing Change Frequency  PRN    Site / Wound Assessment  Painful;Yellow    % Wound base Red or Granulating  15%    % Wound base Yellow/Fibrinous Exudate  85%    % Wound base Black/Eschar  0%    Peri-wound Assessment  Erythema (blanchable);Hemosiderin    Margins  Unattached edges (unapproximated)    Drainage Amount  Minimal    Drainage Description  Sanguineous    Treatment  Cleansed;Debridement (Selective);Hydrotherapy (Pulse lavage)    Pulsed lavage therapy - wound location  ankle    Pulsed Lavage with Suction (psi)  4 psi    Pulsed Lavage with Suction - Normal Saline Used  1000 mL    Pulsed Lavage Tip  Tip with splash  shield    Selective Debridement - Location  wound bed and margins    Selective Debridement - Tools Used  Forceps;Scalpel    Selective Debridement - Tissue Removed  slough/ devitalized tissue     Wound Therapy - Clinical Statement  Patient periwound is intact today with no signs of breakdown or skin irritation. Continued with pulsed lavage and debridement today and granulation tissue appears greater this date. Continued with medihoney and gauze packing this date and profore wrap. Patient will benefit from continued skilled wound care to improve healing environment and prevent infection.    Wound Therapy - Functional Problem List  Pain when walking     Factors Delaying/Impairing Wound Healing  Infection - systemic/local;Vascular compromise    Hydrotherapy Plan  Debridement;Dressing change;Patient/family education;Pulsatile lavage with suction    Wound Therapy - Frequency  3X / week    Wound Therapy - Current Recommendations  PT    Wound Plan  Cleanse and debride wound followed by application of compression.  Measure weekly.     Dressing   medihoney to wound, , 2x2, profore.        PT Short Term Goals - 04/30/18 1610      PT SHORT TERM GOAL #1   Title  Pt pain to be no greater than a 4/10 to allow pt to be up for over an hour in comfort to complete household duties.     Time  3    Period  Weeks    Status  On-going      PT SHORT TERM GOAL #2   Title  Pt wound to be 100% granulated to decrease infection     Time  3    Period  Weeks    Status  On-going      PT SHORT TERM GOAL #3   Title  PT wound to to be approximated to 2x1.5 cm    Time  3    Period  Weeks    Status  On-going        PT Long Term Goals - 04/30/18 1610      PT LONG TERM GOAL #1   Title  PT to have no pain in her left ankle to be able to be up for over 2 hours at a time to shop     Time  6    Period  Weeks    Status  On-going      PT LONG TERM GOAL #2  Title  Wound to be healed    Time  6    Period  Weeks     Status  On-going      PT LONG TERM GOAL #3   Title  PT to be wearing compression garment everyday.     Time  6    Period  Weeks    Status  On-going       Plan - 05/16/18 1120    Clinical Impression Statement  see above    Rehab Potential  Good    PT Frequency  3x / week    PT Duration  --   1 week followed by 2x a wk for 5 weeks    PT Treatment/Interventions  Other (comment);Compression bandaging   debridement, compression bandaging    PT Next Visit Plan  see above     Consulted and Agree with Plan of Care  Patient       Patient will benefit from skilled therapeutic intervention in order to improve the following deficits and impairments:  Increased edema, Difficulty walking, Decreased skin integrity, Pain  Visit Diagnosis: Pain in left ankle and joints of left foot  Difficulty in walking, not elsewhere classified  Ankle wound, left, sequela     Problem List Patient Active Problem List   Diagnosis Date Noted  . Unilateral primary osteoarthritis, left knee 05/02/2017  . S/P total knee replacement using cement, left 05/02/2017  . Primary osteoarthritis of left knee 12/21/2016  . Chondromalacia patellae, left knee 05/26/2016  . Breast cancer of upper-inner quadrant of right female breast ([place]) 11/04/2015  . GERD (gastroesophageal reflux disease) 09/02/2013  . Obesity 09/02/2013  . Ventral hernia, recurrent 09/02/2013  . Unspecified asthma(493.90) 12/12/2012  . Cough variant asthma 03/27/2012  . Right middle lobe syndrome 03/27/2012    Kipp Brood, PT, DPT Physical Therapist with [facility]  05/16/2018 11:20 AM    [facility] [place], [place], [address] Phone: [phone]   Fax:  [phone]  Name: STARR URIAS MRN: [id number] Date of Birth: 09/05/1945

## 2018-05-17 ENCOUNTER — Other Ambulatory Visit: Payer: Medicare Other | Admitting: Vascular Surgery

## 2018-05-17 ENCOUNTER — Encounter: Payer: Self-pay | Admitting: Internal Medicine

## 2018-05-17 NOTE — Progress Notes (Signed)
Spoke with pt and notified of results per Dr. Melvyn Novas. Pt verbalized understanding and denied any questions. She will let us know if she wants referral, but for now wants to hold off as she is not bothered by these symptoms

## 2018-05-17 NOTE — Assessment & Plan Note (Addendum)
-  Eos 0.3 04/07/18 - d/c advair 04/13/2018  - 04/13/2018   try symb 80 2bid  - Allergy profile 05/15/2018 >  Eos 0.3 /  IgE  1227 RAST pos to everything but cats and mold   - 05/15/2018  After extensive coaching inhaler device,  effectiveness =    75% >> change symbicort to 80 2bid "prn"    Despite very impressive allergy profile,  all goals of chronic asthma control met including optimal function and elimination of symptoms with minimal need for rescue therapy on low dose symbicort  Contingencies discussed in full including contacting this office immediately if not controlling the symptoms using the rule of two's.      Based on two studies from NEJM  378; 20 p 1865 (2018) and 380 : p2020-30 (2019) in pts with mild asthma it is reasonable to use low dose symbicort eg 80 2bid "prn" flare in this setting but I emphasized this was only shown with symbicort and takes advantage of the rapid onset of action but is not the same as "rescue therapy" but can be stopped once the acute symptoms have resolved and the need for rescue has been minimized (< 2 x weekly)     Pulmonary f/u can be prn > if not well controlled on symb 80 2bid rec increase to symb 160 2bid and probably no point in trying singulair again as didn't seem to help much.   If not controlled on symb 160 2bid needs allergy eval next > rec [place] [facility].    I had an extended summary final discussion with the patient reviewing all relevant studies completed to date and  lasting 15 to 20 minutes of a 25 minute visit    See device teaching which extended face to face time for this visit.  Each maintenance medication was reviewed in detail including emphasizing most importantly the difference between maintenance and prns and under what circumstances the prns are to be triggered using an action plan format that is not reflected in the computer generated alphabetically organized AVS which I have not found useful in most complex patients,  especially with respiratory illnesses  Please see AVS for specific instructions unique to this visit that I personally wrote and verbalized to the the pt in detail and then reviewed with pt  by my nurse highlighting any  changes in therapy recommended at today's visit to their plan of care.

## 2018-05-18 ENCOUNTER — Ambulatory Visit (HOSPITAL_COMMUNITY): Payer: Medicare Other

## 2018-05-18 ENCOUNTER — Encounter (HOSPITAL_COMMUNITY): Payer: Self-pay

## 2018-05-18 DIAGNOSIS — R262 Difficulty in walking, not elsewhere classified: Secondary | ICD-10-CM | POA: Diagnosis not present

## 2018-05-18 DIAGNOSIS — S91002S Unspecified open wound, left ankle, sequela: Secondary | ICD-10-CM | POA: Diagnosis not present

## 2018-05-18 DIAGNOSIS — M25572 Pain in left ankle and joints of left foot: Secondary | ICD-10-CM | POA: Diagnosis not present

## 2018-05-18 DIAGNOSIS — M25562 Pain in left knee: Secondary | ICD-10-CM | POA: Diagnosis not present

## 2018-05-18 NOTE — Therapy (Signed)
[place], [place], [address] Phone: [phone]   Fax:  [phone]  Wound Care Therapy  Patient Details  Name: Caitlin [name] MRN: [id number] Date of Birth: 1946/03/09 Referring Provider (PT): Sharilyn Sites    Encounter Date: 05/18/2018  PT End of Session - 05/18/18 1000    Visit Number  13    Number of Visits  18    Date for PT Re-Evaluation  05/28/18    Authorization Type  Medicare A & B    Authorization Time Period  9/30-->05/28/2018    Authorization - Visit Number  3    Authorization - Number of Visits  10    PT Start Time  0904    PT Stop Time  0945    PT Time Calculation (min)  41 min    Activity Tolerance  Patient tolerated treatment well    Behavior During Therapy  [facility] for tasks assessed/performed       Past Medical History:  Diagnosis Date  . Arthritis   . Asthma   . Breast cancer ([place])   . Breast disorder    cancer  . Breast mass    benign  . Cellulitis   . GERD (gastroesophageal reflux disease)   . Heart murmur    AS CHILD  OUTGREW IT  . History of hiatal hernia   . Hyperlipidemia   . Hypertension   . Impaired fasting glucose   . Rectocele   . Varicose vein of leg     Past Surgical History:  Procedure Laterality Date  . BREAST LUMPECTOMY WITH RADIOACTIVE SEED AND SENTINEL LYMPH NODE BIOPSY Right 11/11/2015   Procedure: RIGHT BREAST RADIOACTIVE SEED GUIDED  LUMPECTOMY AND RADIOACTIVE SEED TARGETED SENTINEL LYMPH NODE BIOPSY;  Surgeon: Stark Klein, MD;  Location: [place];  Service: General;  Laterality: Right;  . BREAST SURGERY Left    breast biopies x4  . CHOLECYSTECTOMY  09/08/2010  . COLONOSCOPY    . COLONOSCOPY N/A 09/25/2013   Procedure: COLONOSCOPY;  Surgeon: Rogene Houston, MD;  [facility];  Service: Endoscopy;  Laterality: N/A;  100-moved to 1200 Ann to notify pt  . HERNIA REPAIR  12/24/2010, 12-2014   TOTAL 4  . LAPAROSCOPIC GASTRIC SLEEVE  RESECTION  11-2013   at [place]  2007/2008  . TONSILLECTOMY  1968  . TOTAL KNEE ARTHROPLASTY Left 05/02/2017  . TOTAL KNEE ARTHROPLASTY Left 05/02/2017   Procedure: LEFT TOTAL KNEE ARTHROPLASTY;  Surgeon: [name], MD;  Location: [place];  Service: Orthopedics;  Laterality: Left;  . UPPER GASTROINTESTINAL ENDOSCOPY    . VAGINAL HYSTERECTOMY  1980s    There were no vitals filed for this visit.   Subjective Assessment - 05/18/18 0955    Subjective  Pt arrived with dressing intact, no pain.  Pt wishes to take a photo of the wound today    Currently in Pain?  No/denies                Wound Therapy - 05/18/18 0956    Subjective  Pt arrived with dressing intact, no pain.  Pt wishes to take a photo of the wound today    Patient and Family Stated Goals  wound to heal and pain to be gone     Date of Onset  04/05/18    Prior Treatments  medication and self care.     Pain Scale  0-10    Evaluation and Treatment Procedures Explained to Patient/Family  Yes    Evaluation and Treatment Procedures  agreed to    Wound Properties Date First Assessed: 04/19/18 Time First Assessed: 0955 Wound Type: Venous stasis ulcer Location: Ankle Location Orientation: Left;Medial Present on Admission: Yes   Dressing Type  Gauze (Comment);Compression wrap;Impregnated gauze (bismuth)   medihoney, xeroform on perimeter, gauze, profore   Dressing Changed  Changed    Dressing Status  Clean;Dry;Old drainage    Dressing Change Frequency  PRN    Site / Wound Assessment  Painful;Yellow    % Wound base Red or Granulating  15%    % Wound base Yellow/Fibrinous Exudate  85%    Wound Length (cm)  0.7 cm   was .8   Wound Width (cm)  1 cm   was 1   Wound Depth (cm)  0.7 cm   was .7   Wound Volume (cm^3)  0.49 cm^3    Wound Surface Area (cm^2)  0.7 cm^2    Margins  Unattached edges (unapproximated)    Drainage Amount  Minimal    Drainage Description  Sanguineous     Treatment  Cleansed;Debridement (Selective);Hydrotherapy (Pulse lavage)    Pulsed lavage therapy - wound location  ankle    Pulsed Lavage with Suction (psi)  4 psi    Pulsed Lavage with Suction - Normal Saline Used  1000 mL    Pulsed Lavage Tip  Tip with splash shield    Selective Debridement - Location  wound bed and margins    Selective Debridement - Tools Used  Forceps;Scalpel   qtips   Selective Debridement - Tissue Removed  slough/ devitalized tissue     Wound Therapy - Clinical Statement  Good skin integity.  Pt continues to have adherent slough in wound bed wiht some sensitivity today during debridement.  Continued with PLS and selective debridement for removal of slough in wound bed.  Continued with medihoney induced gauze packed into wound and profore for edema control.  No reports of increased pain at EOS.      Wound Therapy - Functional Problem List  Pain when walking     Factors Delaying/Impairing Wound Healing  Infection - systemic/local;Vascular compromise    Hydrotherapy Plan  Debridement;Dressing change;Patient/family education;Pulsatile lavage with suction    Wound Therapy - Frequency  3X / week    Wound Therapy - Current Recommendations  PT    Wound Plan  Cleanse and debride wound followed by application of compression.  Measure weekly.     Dressing   medihoney to wound, xerofore perimeter , 2x2, profore.                 PT Short Term Goals - 04/30/18 1610      PT SHORT TERM GOAL #1   Title  Pt pain to be no greater than a 4/10 to allow pt to be up for over an hour in comfort to complete household duties.     Time  3    Period  Weeks    Status  On-going      PT SHORT TERM GOAL #2   Title  Pt wound to be 100% granulated to decrease infection     Time  3    Period  Weeks    Status  On-going      PT SHORT TERM GOAL #3   Title  PT wound to to be approximated to 2x1.5 cm    Time  3    Period  Weeks    Status  On-going        PT Long Term Goals -  04/30/18 1610      PT LONG TERM GOAL #1   Title  PT to have no pain in her left ankle to be able to be up for over 2 hours at a time to shop     Time  6    Period  Weeks    Status  On-going      PT LONG TERM GOAL #2   Title  Wound to be healed    Time  6    Period  Weeks    Status  On-going      PT LONG TERM GOAL #3   Title  PT to be wearing compression garment everyday.     Time  6    Period  Weeks    Status  On-going              Patient will benefit from skilled therapeutic intervention in order to improve the following deficits and impairments:     Visit Diagnosis: Pain in left ankle and joints of left foot  Difficulty in walking, not elsewhere classified  Ankle wound, left, sequela     Problem List Patient Active Problem List   Diagnosis Date Noted  . Unilateral primary osteoarthritis, left knee 05/02/2017  . S/P total knee replacement using cement, left 05/02/2017  . Primary osteoarthritis of left knee 12/21/2016  . Chondromalacia patellae, left knee 05/26/2016  . Breast cancer of upper-inner quadrant of right female breast ([place]) 11/04/2015  . GERD (gastroesophageal reflux disease) 09/02/2013  . Obesity 09/02/2013  . Ventral hernia, recurrent 09/02/2013  . Unspecified asthma(493.90) 12/12/2012  . Cough variant asthma 03/27/2012  . Right middle lobe syndrome 03/27/2012   Ihor Austin, LPTA; [place]  Aldona Lento 05/18/2018, 10:05 AM  [place], [place], [address] Phone: [phone]   Fax:  [phone]  Name: JENNELL JANOSIK MRN: [id number] Date of Birth: Nov 28, 1945

## 2018-05-21 ENCOUNTER — Ambulatory Visit (HOSPITAL_COMMUNITY): Payer: Medicare Other

## 2018-05-21 ENCOUNTER — Encounter (HOSPITAL_COMMUNITY): Payer: Self-pay

## 2018-05-21 ENCOUNTER — Other Ambulatory Visit: Payer: Self-pay

## 2018-05-21 DIAGNOSIS — R262 Difficulty in walking, not elsewhere classified: Secondary | ICD-10-CM | POA: Diagnosis not present

## 2018-05-21 DIAGNOSIS — M25562 Pain in left knee: Secondary | ICD-10-CM | POA: Diagnosis not present

## 2018-05-21 DIAGNOSIS — S91002S Unspecified open wound, left ankle, sequela: Secondary | ICD-10-CM | POA: Diagnosis not present

## 2018-05-21 DIAGNOSIS — M25572 Pain in left ankle and joints of left foot: Secondary | ICD-10-CM

## 2018-05-21 NOTE — Therapy (Signed)
[place], [place], [address] Phone: [phone]   Fax:  [phone]  Wound Care Therapy  Patient Details  Name: Caitlin [name] MRN: [id number] Date of Birth: 01-28-1946 Referring Provider (PT): Sharilyn Sites    Encounter Date: 05/21/2018  PT End of Session - 05/21/18 1230    Visit Number  14    Number of Visits  18    Date for PT Re-Evaluation  05/28/18    Authorization Type  Medicare A & B    Authorization Time Period  9/30-->05/28/2018    Authorization - Visit Number  4    Authorization - Number of Visits  10    PT Start Time  0904    PT Stop Time  0948    PT Time Calculation (min)  44 min    Activity Tolerance  Patient tolerated treatment well    Behavior During Therapy  [facility] for tasks assessed/performed       Past Medical History:  Diagnosis Date  . Arthritis   . Asthma   . Breast cancer ([place])   . Breast disorder    cancer  . Breast mass    benign  . Cellulitis   . GERD (gastroesophageal reflux disease)   . Heart murmur    AS CHILD  OUTGREW IT  . History of hiatal hernia   . Hyperlipidemia   . Hypertension   . Impaired fasting glucose   . Rectocele   . Varicose vein of leg     Past Surgical History:  Procedure Laterality Date  . BREAST LUMPECTOMY WITH RADIOACTIVE SEED AND SENTINEL LYMPH NODE BIOPSY Right 11/11/2015   Procedure: RIGHT BREAST RADIOACTIVE SEED GUIDED  LUMPECTOMY AND RADIOACTIVE SEED TARGETED SENTINEL LYMPH NODE BIOPSY;  Surgeon: Stark Klein, MD;  Location: [place];  Service: General;  Laterality: Right;  . BREAST SURGERY Left    breast biopies x4  . CHOLECYSTECTOMY  09/08/2010  . COLONOSCOPY    . COLONOSCOPY N/A 09/25/2013   Procedure: COLONOSCOPY;  Surgeon: Rogene Houston, MD;  [facility];  Service: Endoscopy;  Laterality: N/A;  100-moved to 1200 Ann to notify pt  . HERNIA REPAIR  12/24/2010, 12-2014   TOTAL 4  . LAPAROSCOPIC GASTRIC SLEEVE  RESECTION  11-2013   at [place]  2007/2008  . TONSILLECTOMY  1968  . TOTAL KNEE ARTHROPLASTY Left 05/02/2017  . TOTAL KNEE ARTHROPLASTY Left 05/02/2017   Procedure: LEFT TOTAL KNEE ARTHROPLASTY;  Surgeon: [name], MD;  Location: [address];  Service: Orthopedics;  Laterality: Left;  . UPPER GASTROINTESTINAL ENDOSCOPY    . VAGINAL HYSTERECTOMY  1980s    There were no vitals filed for this visit.     Wound Therapy - 05/21/18 1224    Subjective  Patient reports she has been sore this weekend form the debridement last session. She denies pain at the start of session.    Patient and Family Stated Goals  wound to heal and pain to be gone     Date of Onset  04/05/18    Prior Treatments  medication and self care.     Pain Scale  0-10    Pain Score  0-No pain    Evaluation and Treatment Procedures Explained to Patient/Family  Yes    Evaluation and Treatment Procedures  agreed to    Wound Properties Date First Assessed: 04/19/18 Time First Assessed: 1324 Wound Type:  Venous stasis ulcer Location: Ankle Location Orientation: Left;Medial Present on Admission: Yes   Dressing Type  Gauze (Comment);Compression wrap;Impregnated gauze (bismuth)   medihoney, xeroform on perimeter, gauze, profore   Dressing Changed  Changed    Dressing Status  Clean;Dry;Old drainage    Dressing Change Frequency  PRN    Site / Wound Assessment  Painful;Yellow    % Wound base Red or Granulating  20%    % Wound base Yellow/Fibrinous Exudate  80%    Margins  Unattached edges (unapproximated)    Drainage Amount  Minimal    Drainage Description  Sanguineous    Treatment  Cleansed;Debridement (Selective);Hydrotherapy (Pulse lavage)    Pulsed lavage therapy - wound location  ankle    Pulsed Lavage with Suction (psi)  4 psi    Pulsed Lavage with Suction - Normal Saline Used  1000 mL    Pulsed Lavage Tip  Tip with splash shield    Selective Debridement - Location  wound bed and  margins    Selective Debridement - Tools Used  Forceps;Scalpel   qtips   Selective Debridement - Tissue Removed  slough/ devitalized tissue     Wound Therapy - Clinical Statement  Patient arrived with dressing intact and continued with sharps debridement and pulsed lavage this session. Improved granulation tissue observed today after debridement and slough continues to be more easily removed however majority of slough remains adherent. Periwound remains intact with no signs of maceration. Continued with medihoney impregnated gauze to pack wound and profore wrap to manage edema. She will continue to benefit from skilled wound care to promote healing environment for wound closure.    Wound Therapy - Functional Problem List  Pain when walking     Factors Delaying/Impairing Wound Healing  Infection - systemic/local;Vascular compromise    Hydrotherapy Plan  Debridement;Dressing change;Patient/family education;Pulsatile lavage with suction    Wound Therapy - Frequency  3X / week    Wound Therapy - Current Recommendations  PT    Wound Plan  Cleanse and debride wound followed by application of compression.  Measure weekly.     Dressing   medihoney to wound, xerofore perimeter , 2x2, profore.         PT Short Term Goals - 04/30/18 1610      PT SHORT TERM GOAL #1   Title  Pt pain to be no greater than a 4/10 to allow pt to be up for over an hour in comfort to complete household duties.     Time  3    Period  Weeks    Status  On-going      PT SHORT TERM GOAL #2   Title  Pt wound to be 100% granulated to decrease infection     Time  3    Period  Weeks    Status  On-going      PT SHORT TERM GOAL #3   Title  PT wound to to be approximated to 2x1.5 cm    Time  3    Period  Weeks    Status  On-going        PT Long Term Goals - 04/30/18 1610      PT LONG TERM GOAL #1   Title  PT to have no pain in her left ankle to be able to be up for over 2 hours at a time to shop     Time  6    Period   Weeks    Status  On-going      PT LONG TERM GOAL #2   Title  Wound to be healed    Time  6    Period  Weeks    Status  On-going      PT LONG TERM GOAL #3   Title  PT to be wearing compression garment everyday.     Time  6    Period  Weeks    Status  On-going        Plan - 05/21/18 1230    Clinical Impression Statement  see above    Rehab Potential  Good    PT Frequency  3x / week    PT Duration  --   1 week followed by 2x a wk for 5 weeks    PT Treatment/Interventions  Other (comment);Compression bandaging   debridement, compression bandaging    PT Next Visit Plan  see above     Consulted and Agree with Plan of Care  Patient       Patient will benefit from skilled therapeutic intervention in order to improve the following deficits and impairments:  Increased edema, Difficulty walking, Decreased skin integrity, Pain  Visit Diagnosis: Pain in left ankle and joints of left foot  Difficulty in walking, not elsewhere classified  Ankle wound, left, sequela     Problem List Patient Active Problem List   Diagnosis Date Noted  . Unilateral primary osteoarthritis, left knee 05/02/2017  . S/P total knee replacement using cement, left 05/02/2017  . Primary osteoarthritis of left knee 12/21/2016  . Chondromalacia patellae, left knee 05/26/2016  . Breast cancer of upper-inner quadrant of right female breast ([place]) 11/04/2015  . GERD (gastroesophageal reflux disease) 09/02/2013  . Obesity 09/02/2013  . Ventral hernia, recurrent 09/02/2013  . Unspecified asthma(493.90) 12/12/2012  . Cough variant asthma 03/27/2012  . Right middle lobe syndrome 03/27/2012    Kipp Brood, PT, DPT Physical Therapist with [facility]  05/21/2018 12:31 PM    [facility] [address] [place], [place], [address] Phone: [phone]   Fax:  [phone]  Name: Caitlin [name] MRN: [id number] Date of Birth:  06-22-1946

## 2018-05-23 ENCOUNTER — Encounter (HOSPITAL_COMMUNITY): Payer: Self-pay

## 2018-05-23 ENCOUNTER — Ambulatory Visit (HOSPITAL_COMMUNITY): Payer: Medicare Other

## 2018-05-23 DIAGNOSIS — M25572 Pain in left ankle and joints of left foot: Secondary | ICD-10-CM | POA: Diagnosis not present

## 2018-05-23 DIAGNOSIS — S91002S Unspecified open wound, left ankle, sequela: Secondary | ICD-10-CM

## 2018-05-23 DIAGNOSIS — R262 Difficulty in walking, not elsewhere classified: Secondary | ICD-10-CM | POA: Diagnosis not present

## 2018-05-23 DIAGNOSIS — M25562 Pain in left knee: Secondary | ICD-10-CM | POA: Diagnosis not present

## 2018-05-23 NOTE — Therapy (Signed)
[facility] [address] [place], [place], [address] Phone: [phone]   Fax:  [phone]  Wound Care Therapy  Patient Details  Name: Caitlin [name] MRN: [id number] Date of Birth: 1945-10-09 Referring Provider (PT): Sharilyn Sites    Encounter Date: 05/23/2018  PT End of Session - 05/23/18 0959    Visit Number  15    Number of Visits  18    Date for PT Re-Evaluation  05/28/18    Authorization Type  Medicare A & B    Authorization Time Period  9/30-->05/28/2018    Authorization - Visit Number  5    Authorization - Number of Visits  10    PT Start Time  0903    PT Stop Time  0946    PT Time Calculation (min)  43 min    Activity Tolerance  Patient tolerated treatment well;Patient limited by pain   pain during debridement   Behavior During Therapy  [facility] for tasks assessed/performed       Past Medical History:  Diagnosis Date  . Arthritis   . Asthma   . Breast cancer ([place])   . Breast disorder    cancer  . Breast mass    benign  . Cellulitis   . GERD (gastroesophageal reflux disease)   . Heart murmur    AS CHILD  OUTGREW IT  . History of hiatal hernia   . Hyperlipidemia   . Hypertension   . Impaired fasting glucose   . Rectocele   . Varicose vein of leg     Past Surgical History:  Procedure Laterality Date  . BREAST LUMPECTOMY WITH RADIOACTIVE SEED AND SENTINEL LYMPH NODE BIOPSY Right 11/11/2015   Procedure: RIGHT BREAST RADIOACTIVE SEED GUIDED  LUMPECTOMY AND RADIOACTIVE SEED TARGETED SENTINEL LYMPH NODE BIOPSY;  Surgeon: Stark Klein, MD;  Location: [place];  Service: General;  Laterality: Right;  . BREAST SURGERY Left    breast biopies x4  . CHOLECYSTECTOMY  09/08/2010  . COLONOSCOPY    . COLONOSCOPY N/A 09/25/2013   Procedure: COLONOSCOPY;  Surgeon: Rogene Houston, MD;  [facility];  Service: Endoscopy;  Laterality: N/A;  100-moved to 1200 Ann to notify pt  . HERNIA REPAIR  12/24/2010,  12-2014   TOTAL 4  . LAPAROSCOPIC GASTRIC SLEEVE RESECTION  11-2013   at [place]  2007/2008  . TONSILLECTOMY  1968  . TOTAL KNEE ARTHROPLASTY Left 05/02/2017  . TOTAL KNEE ARTHROPLASTY Left 05/02/2017   Procedure: LEFT TOTAL KNEE ARTHROPLASTY;  Surgeon: [name], MD;  Location: [place];  Service: Orthopedics;  Laterality: Left;  . UPPER GASTROINTESTINAL ENDOSCOPY    . VAGINAL HYSTERECTOMY  1980s    There were no vitals filed for this visit.   Subjective Assessment - 05/23/18 1719    Subjective  No reports of pain at beginning of session.  Pt does express increased feelling and pain during debridement sometimes.  Dressings intact    Currently in Pain?  No/denies                Wound Therapy - 05/23/18 1720    Subjective  No reports of pain at beginning of session.  Pt does express increased feelling and pain during debridement sometimes.  Dressings intact    Patient and Family Stated Goals  wound to heal and pain to be gone     Date of Onset  04/05/18  Prior Treatments  medication and self care.     Pain Scale  0-10    Evaluation and Treatment Procedures Explained to Patient/Family  Yes    Evaluation and Treatment Procedures  agreed to    Wound Properties Date First Assessed: 04/19/18 Time First Assessed: 0955 Wound Type: Venous stasis ulcer Location: Ankle Location Orientation: Left;Medial Present on Admission: Yes   Dressing Type  Alginate;Impregnated gauze (bismuth);Compression wrap    Dressing Changed  Changed    Dressing Status  Clean;Dry;Old drainage    Dressing Change Frequency  PRN    Site / Wound Assessment  Painful;Yellow    % Wound base Red or Granulating  25%    % Wound base Yellow/Fibrinous Exudate  75%    % Wound base Black/Eschar  0%    Wound Length (cm)  0.7 cm   .7   Wound Width (cm)  0.7 cm   was 1   Wound Depth (cm)  0.7 cm   unknown   Wound Volume (cm^3)  0.34 cm^3    Wound Surface Area (cm^2)  0.49  cm^2    Undermining (cm)  12': between .5-->.8; 3' between .5-->.8; 6' 2cm; 12' between .5-->.8   misplaced measurement sheet, range between   Margins  Unattached edges (unapproximated)    Drainage Amount  Minimal    Drainage Description  Sanguineous    Treatment  Cleansed;Debridement (Selective)    Selective Debridement - Location  wound bed and margins    Selective Debridement - Tools Used  Scalpel   qtips   Selective Debridement - Tissue Removed  slough/ devitalized tissue     Wound Therapy - Clinical Statement  Noted increased maceration perimeter of wound.  DC'd PLS this session due to improved granulation in wound bed per evaluation PT.  Noted  undermining 2cm at 6'.  Changed dressing to alginate with packing in undermining to address maceration.  Xeroform placed on perimeter of wound to improve skin integrity.    Wound Therapy - Functional Problem List  Pain when walking     Factors Delaying/Impairing Wound Healing  Infection - systemic/local;Vascular compromise    Hydrotherapy Plan  Debridement;Dressing change;Patient/family education;Pulsatile lavage with suction    Wound Therapy - Frequency  3X / week    Wound Therapy - Current Recommendations  PT    Wound Plan  Cleanse and debride wound followed by application of compression.  Measure weekly.     Dressing   alginate packed into wound especially at 6', xeroform perimeter with profore.                 PT Short Term Goals - 04/30/18 1610      PT SHORT TERM GOAL #1   Title  Pt pain to be no greater than a 4/10 to allow pt to be up for over an hour in comfort to complete household duties.     Time  3    Period  Weeks    Status  On-going      PT SHORT TERM GOAL #2   Title  Pt wound to be 100% granulated to decrease infection     Time  3    Period  Weeks    Status  On-going      PT SHORT TERM GOAL #3   Title  PT wound to to be approximated to 2x1.5 cm    Time  3    Period  Weeks    Status  On-going  PT  Long Term Goals - 04/30/18 1610      PT LONG TERM GOAL #1   Title  PT to have no pain in her left ankle to be able to be up for over 2 hours at a time to shop     Time  6    Period  Weeks    Status  On-going      PT LONG TERM GOAL #2   Title  Wound to be healed    Time  6    Period  Weeks    Status  On-going      PT LONG TERM GOAL #3   Title  PT to be wearing compression garment everyday.     Time  6    Period  Weeks    Status  On-going              Patient will benefit from skilled therapeutic intervention in order to improve the following deficits and impairments:     Visit Diagnosis: Pain in left ankle and joints of left foot  Difficulty in walking, not elsewhere classified  Ankle wound, left, sequela     Problem List Patient Active Problem List   Diagnosis Date Noted  . Unilateral primary osteoarthritis, left knee 05/02/2017  . S/P total knee replacement using cement, left 05/02/2017  . Primary osteoarthritis of left knee 12/21/2016  . Chondromalacia patellae, left knee 05/26/2016  . Breast cancer of upper-inner quadrant of right female breast ([place]) 11/04/2015  . GERD (gastroesophageal reflux disease) 09/02/2013  . Obesity 09/02/2013  . Ventral hernia, recurrent 09/02/2013  . Unspecified asthma(493.90) 12/12/2012  . Cough variant asthma 03/27/2012  . Right middle lobe syndrome 03/27/2012   Ihor Austin, LPTA; [place]  Aldona Lento 05/23/2018, 5:32 PM  [facility] [address] [place], [place], [address] Phone: [phone]   Fax:  [phone]  Name: Caitlin [name] MRN: [id number] Date of Birth: Mar 29, 1946

## 2018-05-24 ENCOUNTER — Other Ambulatory Visit: Payer: Self-pay

## 2018-05-24 ENCOUNTER — Inpatient Hospital Stay (HOSPITAL_COMMUNITY): Payer: Medicare Other | Attending: Internal Medicine | Admitting: Internal Medicine

## 2018-05-24 DIAGNOSIS — I10 Essential (primary) hypertension: Secondary | ICD-10-CM | POA: Diagnosis not present

## 2018-05-24 DIAGNOSIS — C50211 Malignant neoplasm of upper-inner quadrant of right female breast: Secondary | ICD-10-CM | POA: Insufficient documentation

## 2018-05-24 DIAGNOSIS — Z170 Estrogen receptor positive status [ER+]: Secondary | ICD-10-CM | POA: Insufficient documentation

## 2018-05-24 DIAGNOSIS — L03116 Cellulitis of left lower limb: Secondary | ICD-10-CM | POA: Diagnosis not present

## 2018-05-24 DIAGNOSIS — M8580 Other specified disorders of bone density and structure, unspecified site: Secondary | ICD-10-CM | POA: Insufficient documentation

## 2018-05-24 MED ORDER — ANASTROZOLE 1 MG PO TABS: 1.0000 mg | ORAL_TABLET | Freq: Every day | ORAL | 4 refills | Status: DC

## 2018-05-24 NOTE — Progress Notes (Signed)
Diagnosis Malignant neoplasm of upper-inner quadrant of right female breast, unspecified estrogen receptor status ([place]) - Plan: anastrozole (ARIMIDEX) 1 MG tablet, CBC with Differential/Platelet, Comprehensive metabolic panel, Lactate dehydrogenase, MM DIAG BREAST TOMO BILATERAL  Staging Cancer Staging Breast cancer of upper-inner quadrant of right female breast [facility]) Staging form: Breast, AJCC 7th Edition - Pathologic stage from 11/11/2015: Stage IA (T1c, N0, cM0) - Signed by Holley Bouche, NP on 04/18/2016   Assessment and Plan:  1.  Stage 1 A pT1cN0M0 ER+ PR+ HER 2 neu - carcinoma of the R breast, upper inner quadrant, ER+ 100% PR + 100% HER 2 -.  Pt is s/p Lumpectomy/sentinel node biopsy with Dr. Barry Dienes on 11/11/2015.  She was treated with adjuvant radiation.  She had Oncotype evaluation which placed her in a low risk category.  Patient had bilateral diagnostic mammogram done 11/21/2017 that was negative with repeat imaging recommended in April 2020.  She remains on Arimidex.  She is recommended for 5 years of therapy which may extend to 10 years.   Rx for Arimidex # 90 with 4 refills.  Pt will be seen for follow-up in 10/2018.   2.  Osteopenia.  This was noted on bone density done March 2019.  She should continue calcium and vitamin D as recommended.  Will repeat bone density in 09/2019.    3  Cellulitis.  Pt is being seen by wound care due to  LE cellulitis.  Pt should follow-up with wound care and PCP as recommended.    4.  Hypertension.  Blood pressure is 133/60.    Follow-up with PCP.  Current Status: Patient seen today for follow-up.  She reports she is doing well and is tolerating Arimidex.  She is being seen in wound care clinic due to cellulitis.       Breast cancer of upper-inner quadrant of right female breast ([place])   09/24/2014 Imaging    DEXA with normal bone density    10/06/2015 Imaging    Screening bilateral mammogram BI-RADS Category 0, R breast possible mass, L breast  possible mass    10/20/2015 Imaging    Diagnostic B mammogram, R breast Ultrasound with suspicious mass in the R breast at the 1 o clock location 5 cm from the nipple, LN in the low R axillae with cortex nodularity    10/27/2015 Initial Biopsy    Ultrasound guided biopsy of R axillary LN, BENIGN. Ultrasound guided biopsy of R breast with invasive ductal carcinoma    11/04/2015 Initial Diagnosis    Breast cancer of upper-inner quadrant of right female breast ([place])    11/09/2015 Procedure    Radioactive seed localization R breast    11/11/2015 Surgery    R breast radioactive seed localized lumpectomy and seed targeted sentinel LN biopsy    11/11/2015 Pathology Results    Invasive ductal carcinoma Grade I/III spanning 1.1 cm, lobular neoplasia (LCIS) resection margins negative. 3 negative sentinel nodes ER (100%), PR (100%), Her2 neu negative pT1c, pN0    12/11/2015 Oncotype testing    Recurrence Score result 11, 10 year risk of distant recurrence with tamoxifen alone 8% (low-risk)    01/11/2016 - 02/04/2016 Radiation Therapy    Adjuvant breast radiation ([place]): 42.72 Gy in 16 fractions to the right breast    01/2016 -  Anti-estrogen oral therapy    Anastrazole 1 mg daily. Planned duration of therapy: 5 years.       Problem List Patient Active Problem List   Diagnosis Date  Noted  . Unilateral primary osteoarthritis, left knee [M17.12] 05/02/2017  . S/P total knee replacement using cement, left [Z96.652] 05/02/2017  . Primary osteoarthritis of left knee [M17.12] 12/21/2016  . Chondromalacia patellae, left knee [M22.42] 05/26/2016  . Breast cancer of upper-inner quadrant of right female breast ([place]) [C50.211] 11/04/2015  . GERD (gastroesophageal reflux disease) [K21.9] 09/02/2013  . Obesity [E66.9] 09/02/2013  . Ventral hernia, recurrent [K43.2] 09/02/2013  . Unspecified asthma(493.90) [Y09.983] 12/12/2012  . Cough variant asthma [J45.991] 03/27/2012  . Right middle lobe  syndrome [J98.19] 03/27/2012    Past Medical History Past Medical History:  Diagnosis Date  . Arthritis   . Asthma   . Breast cancer ([place])   . Breast disorder    cancer  . Breast mass    benign  . Cellulitis   . GERD (gastroesophageal reflux disease)   . Heart murmur    AS CHILD  OUTGREW IT  . History of hiatal hernia   . Hyperlipidemia   . Hypertension   . Impaired fasting glucose   . Rectocele   . Varicose vein of leg     Past Surgical History Past Surgical History:  Procedure Laterality Date  . BREAST LUMPECTOMY WITH RADIOACTIVE SEED AND SENTINEL LYMPH NODE BIOPSY Right 11/11/2015   Procedure: RIGHT BREAST RADIOACTIVE SEED GUIDED  LUMPECTOMY AND RADIOACTIVE SEED TARGETED SENTINEL LYMPH NODE BIOPSY;  Surgeon: Stark Klein, MD;  Location: [place];  Service: General;  Laterality: Right;  . BREAST SURGERY Left    breast biopies x4  . CHOLECYSTECTOMY  09/08/2010  . COLONOSCOPY    . COLONOSCOPY N/A 09/25/2013   Procedure: COLONOSCOPY;  Surgeon: Rogene Houston, MD;  [facility];  Service: Endoscopy;  Laterality: N/A;  100-moved to 1200 Ann to notify pt  . HERNIA REPAIR  12/24/2010, 12-2014   TOTAL 4  . LAPAROSCOPIC GASTRIC SLEEVE RESECTION  11-2013   at [place]  2007/2008  . TONSILLECTOMY  1968  . TOTAL KNEE ARTHROPLASTY Left 05/02/2017  . TOTAL KNEE ARTHROPLASTY Left 05/02/2017   Procedure: LEFT TOTAL KNEE ARTHROPLASTY;  Surgeon: [name], MD;  Location: [place];  Service: Orthopedics;  Laterality: Left;  . UPPER GASTROINTESTINAL ENDOSCOPY    . VAGINAL HYSTERECTOMY  1980s    Family History Family History  Problem Relation Age of Onset  . Colon cancer Father   . Rectal cancer Father   . Prostate cancer Father   . Dementia Mother   . Osteoporosis Mother   . Healthy Daughter   . Healthy Son   . Pancreatic cancer Paternal Grandfather   . Heart disease Paternal Grandmother   . Heart disease  Maternal Grandfather   . Breast cancer Maternal Grandmother   . Scoliosis Sister      Social History  reports that she has never smoked. She has never used smokeless tobacco. She reports that she does not drink alcohol or use drugs.  Medications  Current Outpatient Medications:  .  anastrozole (ARIMIDEX) 1 MG tablet, Take 1 tablet (1 mg total) by mouth daily., Disp: 90 tablet, Rfl: 4 .  B Complex Vitamins (B-COMPLEX/B-12 SL), Place 1 tablet under the tongue daily. , Disp: , Rfl:  .  budesonide-formoterol (SYMBICORT) 80-4.5 MCG/ACT inhaler, Take 2 puffs first thing in am and then another 2 puffs about 12 hours later., Disp: 1 Inhaler, Rfl: 11 .  Calcium Citrate-Vitamin D (CALCIUM CITRATE CHEWY BITE) 500-500 MG-UNIT CHEW, Chew 500 mg  by mouth 2 (two) times daily. , Disp: , Rfl:  .  docusate sodium (COLACE) 100 MG capsule, Take 100 mg by mouth at bedtime. , Disp: , Rfl:  .  esomeprazole (NEXIUM) 20 MG capsule, Take 40 mg by mouth daily before breakfast. , Disp: , Rfl:  .  famotidine (PEPCID) 20 MG tablet, Take 20 mg by mouth at bedtime. , Disp: , Rfl:  .  Iron-Vitamin C (IRON 100/C PO), Take 1 tablet by mouth daily. Celebrate iron + vit C, Disp: , Rfl:  .  losartan-hydrochlorothiazide (HYZAAR) 100-25 MG tablet, Take 1 tablet by mouth daily., Disp: , Rfl: 1 .  Magnesium 250 MG TABS, Take 500 mg by mouth every evening., Disp: , Rfl:  .  MELATONIN PO, Take 1 tablet by mouth at bedtime as needed (sleep)., Disp: , Rfl:  .  Menaquinone-7 (VITAMIN K2) 100 MCG CAPS, Take 100 mcg by mouth every evening. , Disp: , Rfl:  .  Multiple Vitamin (MULTIVITAMIN) capsule, Take 1 capsule by mouth 2 (two) times daily with a meal. Celebrate Bariatric MVI, Disp: , Rfl:  .  pravastatin (PRAVACHOL) 20 MG tablet, Take 20 mg by mouth at bedtime. , Disp: , Rfl:   Allergies Horse-derived products; Sulfa antibiotics; and Tetanus toxoid adsorbed  Review of Systems Review of Systems - Oncology ROS LE  cellulitis   Physical Exam  Vitals Wt Readings from Last 3 Encounters:  05/24/18 202 lb (91.6 kg)  05/15/18 201 lb 12.8 oz (91.5 kg)  05/07/18 207 lb 3.7 oz (94 kg)   Temp Readings from Last 3 Encounters:  05/24/18 97.9 F (36.6 C) (Oral)  05/07/18 98.6 F (37 C) (Oral)  04/09/18 97.8 F (36.6 C) (Oral)   BP Readings from Last 3 Encounters:  05/24/18 133/60  05/15/18 118/70  05/07/18 121/65   Pulse Readings from Last 3 Encounters:  05/24/18 (!) 110  05/15/18 80  05/07/18 85   Constitutional: Well-developed, well-nourished, and in no distress.   HENT: Head: Normocephalic and atraumatic.  Mouth/Throat: No oropharyngeal exudate. Mucosa moist. Eyes: Pupils are equal, round, and reactive to light. Conjunctivae are normal. No scleral icterus.  Neck: Normal range of motion. Neck supple. No JVD present.  Cardiovascular: Normal rate, regular rhythm and normal heart sounds.  Exam reveals no gallop and no friction rub.   No murmur heard. Pulmonary/Chest: Effort normal and breath sounds normal. No respiratory distress. No wheezes.No rales.  Abdominal: Soft. Bowel sounds are normal. No distension. There is no tenderness. There is no guarding.  Musculoskeletal: Compression stockings noted LE bilaterally.   Lymphadenopathy: No cervical, axillary or supraclavicular adenopathy.  Neurological: Alert and oriented to person, place, and time. No cranial nerve deficit.  Skin: Skin is warm and dry. No rash noted. No erythema. No pallor.  Psychiatric: Affect and judgment normal.  Breast exam:  Chaperone present.  Lumpectomy changes and RT changes noted on right breast.  No dominant masses noted bilaterally.    Labs No visits with results within 3 Day(s) from this visit.  Latest known visit with results is:  Lab on 05/15/2018  Component Date Value Ref Range Status  . Allergen, D pternoyssinus,d7 05/15/2018 1.82* kU/L Final  . Class 05/15/2018 2   Final  . D. farinae 05/15/2018 0.82* kU/L  Final  . Class 05/15/2018 2   Final  . Allergen, P. notatum, m1 05/15/2018 <0.10  kU/L Final  . Class 05/15/2018 0   Final  . CLADOSPORIUM HERBARUM (M2) IGE 05/15/2018 0.12* kU/L Final  .  Class 05/15/2018 0   Final  . Aspergillus fumigatus, m3 05/15/2018 <0.10  kU/L Final  . Class 05/15/2018 0   Final  . Allergen, A. alternata, m6 05/15/2018 <0.10  kU/L Final  . Class 05/15/2018 0   Final  . Cat Dander 05/15/2018 <0.10  kU/L Final  . Class 05/15/2018 0   Final  . Dog Dander 05/15/2018 0.12* kU/L Final  . Class 05/15/2018 0   Final  . Cockroach 05/15/2018 <0.10  kU/L Final  . Class 05/15/2018 0   Final  . Box Elder IgE 05/15/2018 0.11* kU/L Final  . Class 05/15/2018 0   Final  . Allergen, Comm Silver Wendee Copp, t9 05/15/2018 0.12* kU/L Final  . Class 05/15/2018 0   Final  . Allergen, Cedar tree, t12 05/15/2018 0.15* kU/L Final  . Class 05/15/2018 0   Final  . Allergen, Cottonwood, t14 05/15/2018 0.19* kU/L Final  . Class 05/15/2018 0   Final  . Allergen, Oak,t7 05/15/2018 0.15* kU/L Final  . Class 05/15/2018 0   Final  . Elm IgE 05/15/2018 0.18* kU/L Final  . Class 05/15/2018 0   Final  . Pecan/Hickory Tree IgE 05/15/2018 0.16* kU/L Final  . Class 05/15/2018 0   Final  . Allergen, Mulberry, t76 05/15/2018 <0.10  kU/L Final  . Class 05/15/2018 0   Final  . [place] Grass 05/15/2018 <0.10  kU/L Final  . Class 05/15/2018 0   Final  . Jalene Mullet 05/15/2018 0.12* kU/L Final  . Class 05/15/2018 0   Final  . Wynetta Emery Grass 05/15/2018 0.17* kU/L Final  . Class 05/15/2018 0   Final  . COMMON RAGWEED (SHORT) (W1) IGE 05/15/2018 0.39* kU/L Final  . Class 05/15/2018 1   Final  . Rough Pigweed  IgE 05/15/2018 <0.10  kU/L Final  . Class 05/15/2018 0   Final  . Sheep Sorrel IgE 05/15/2018 0.11* kU/L Final  . Class 05/15/2018 0   Final  . Allergen, Mouse Urine Protein, e78 05/15/2018 <0.10  kU/L Final  . Class 05/15/2018 0   Final  . IgE (Immunoglobulin E), Serum 05/15/2018 1,227* <OR=114  kU/L Final  . WBC 05/15/2018 6.3  4.0 - 10.5 K/uL Final  . RBC 05/15/2018 3.47* 3.87 - 5.11 Mil/uL Final  . Hemoglobin 05/15/2018 11.4* 12.0 - 15.0 g/dL Final  . HCT 05/15/2018 34.1* 36.0 - 46.0 % Final  . MCV 05/15/2018 98.2  78.0 - 100.0 fl Final  . MCHC 05/15/2018 33.5  30.0 - 36.0 g/dL Final  . RDW 05/15/2018 13.6  11.5 - 15.5 % Final  . Platelets 05/15/2018 268.0  150.0 - 400.0 K/uL Final  . Neutrophils Relative % 05/15/2018 62.8  43.0 - 77.0 % Final  . Lymphocytes Relative 05/15/2018 21.8  12.0 - 46.0 % Final  . Monocytes Relative 05/15/2018 10.2  3.0 - 12.0 % Final  . Eosinophils Relative 05/15/2018 4.3  0.0 - 5.0 % Final  . Basophils Relative 05/15/2018 0.9  0.0 - 3.0 % Final  . Neutro Abs 05/15/2018 3.9  1.4 - 7.7 K/uL Final  . Lymphs Abs 05/15/2018 1.4  0.7 - 4.0 K/uL Final  . Monocytes Absolute 05/15/2018 0.6  0.1 - 1.0 K/uL Final  . Eosinophils Absolute 05/15/2018 0.3  0.0 - 0.7 K/uL Final  . Basophils Absolute 05/15/2018 0.1  0.0 - 0.1 K/uL Final  . Interpretation 05/15/2018    Final   Comment: . Specific  Level of Allergen IGE Class      kU/L             Specific IGE Antibody  -----         ---------        -------------------   0              <0.10           Absent/Undetectable   0/1        0.10-0.34           Very Low Level   1          0.35-0.69           Low Level   2          0.70-3.49           Moderate Level   3          3.50-17.4           High Level   4          17.5-49.9           Very High Level   5            50-100            Very High Level   6              >100            Very High Level . The clinical relevance of allergen results of 0.10-0.34 kU/L are undetermined and intended for  specialist use. . Allergens denoted with a "**" include results using one or more analyte specific reagents. In those cases, the test was developed and its analytical performance characteristics have been determined by [employer]. It has  not been cleared or approved by the U.S. Food and Drug Administration. This assay  has been v                          alidated pursuant to the [employer]  and is used for clinical purposes.      Pathology Orders Placed This Encounter  Procedures  . MM DIAG BREAST TOMO BILATERAL    Standing Status:   Future    Standing Expiration Date:   05/25/2019    Order Specific Question:   Reason for Exam (SYMPTOM  OR DIAGNOSIS REQUIRED)    Answer:   right breast cancer    Order Specific Question:   Preferred imaging location?    Answer:   [facility]  . CBC with Differential/Platelet    Standing Status:   Future    Standing Expiration Date:   05/24/2020  . Comprehensive metabolic panel    Standing Status:   Future    Standing Expiration Date:   05/24/2020  . Lactate dehydrogenase    Standing Status:   Future    Standing Expiration Date:   05/24/2020       Zoila Shutter MD

## 2018-05-24 NOTE — Patient Instructions (Signed)
Exam and discussion today with Dr. Higgs. Return for follow-up as scheduled.  

## 2018-05-25 ENCOUNTER — Encounter (HOSPITAL_COMMUNITY): Payer: Self-pay

## 2018-05-25 ENCOUNTER — Ambulatory Visit (HOSPITAL_COMMUNITY): Payer: Medicare Other

## 2018-05-25 DIAGNOSIS — S91002S Unspecified open wound, left ankle, sequela: Secondary | ICD-10-CM | POA: Diagnosis not present

## 2018-05-25 DIAGNOSIS — R262 Difficulty in walking, not elsewhere classified: Secondary | ICD-10-CM | POA: Diagnosis not present

## 2018-05-25 DIAGNOSIS — M25572 Pain in left ankle and joints of left foot: Secondary | ICD-10-CM | POA: Diagnosis not present

## 2018-05-25 DIAGNOSIS — M25562 Pain in left knee: Secondary | ICD-10-CM

## 2018-05-25 NOTE — Therapy (Signed)
[place], [place], [address] Phone: [phone]   Fax:  [phone]  Wound Care Therapy  Patient Details  Name: Caitlin [name] MRN: [id number] Date of Birth: Dec 05, 1945 Referring Provider (PT): Sharilyn Sites    Encounter Date: 05/25/2018  PT End of Session - 05/25/18 1231    Visit Number  16    Number of Visits  18    Date for PT Re-Evaluation  05/28/18    Authorization Type  Medicare A & B    Authorization Time Period  9/30-->05/28/2018    Authorization - Visit Number  6    Authorization - Number of Visits  10    PT Start Time  [phone]    PT Stop Time  1035    PT Time Calculation (min)  44 min    Activity Tolerance  Patient tolerated treatment well;Patient limited by pain   Lt UE pain following fall yesterday   Behavior During Therapy  [facility] for tasks assessed/performed       Past Medical History:  Diagnosis Date  . Arthritis   . Asthma   . Breast cancer ([place])   . Breast disorder    cancer  . Breast mass    benign  . Cellulitis   . GERD (gastroesophageal reflux disease)   . Heart murmur    AS CHILD  OUTGREW IT  . History of hiatal hernia   . Hyperlipidemia   . Hypertension   . Impaired fasting glucose   . Rectocele   . Varicose vein of leg     Past Surgical History:  Procedure Laterality Date  . BREAST LUMPECTOMY WITH RADIOACTIVE SEED AND SENTINEL LYMPH NODE BIOPSY Right 11/11/2015   Procedure: RIGHT BREAST RADIOACTIVE SEED GUIDED  LUMPECTOMY AND RADIOACTIVE SEED TARGETED SENTINEL LYMPH NODE BIOPSY;  Surgeon: Stark Klein, MD;  Location: [facility];  Service: General;  Laterality: Right;  . BREAST SURGERY Left    breast biopies x4  . CHOLECYSTECTOMY  09/08/2010  . COLONOSCOPY    . COLONOSCOPY N/A 09/25/2013   Procedure: COLONOSCOPY;  Surgeon: Rogene Houston, MD;  [facility];  Service: Endoscopy;  Laterality: N/A;  100-moved to 1200 Ann to notify pt  . HERNIA REPAIR   12/24/2010, 12-2014   TOTAL 4  . LAPAROSCOPIC GASTRIC SLEEVE RESECTION  11-2013   at [place]  2007/2008  . TONSILLECTOMY  1968  . TOTAL KNEE ARTHROPLASTY Left 05/02/2017  . TOTAL KNEE ARTHROPLASTY Left 05/02/2017   Procedure: LEFT TOTAL KNEE ARTHROPLASTY;  Surgeon: [name], MD;  Location: [place];  Service: Orthopedics;  Laterality: Left;  . UPPER GASTROINTESTINAL ENDOSCOPY    . VAGINAL HYSTERECTOMY  1980s    There were no vitals filed for this visit.   Subjective Assessment - 05/25/18 1224    Subjective  Pt reports a fall walking down long hallway in hosptial, injured Lt UE, current pain scale 6/10.  No reports of pain in LE, arrived with dressing intact    Currently in Pain?  Yes    Pain Score  6     Pain Location  Arm    Pain Orientation  Left    Pain Descriptors / Indicators  Aching;Sore    Pain Type  Acute pain                Wound Therapy - 05/25/18 1224    Subjective  Pt reports  a fall walking down long hallway in [place], injured Lt UE, current pain scale 6/10.  No reports of pain in LE, arrived with dressing intact    Patient and Family Stated Goals  wound to heal and pain to be gone     Date of Onset  04/05/18    Prior Treatments  medication and self care.     Evaluation and Treatment Procedures Explained to Patient/Family  Yes    Evaluation and Treatment Procedures  agreed to    Wound Properties Date First Assessed: 04/19/18 Time First Assessed: 0955 Wound Type: Venous stasis ulcer Location: Ankle Location Orientation: Left;Medial Present on Admission: Yes   Dressing Type  Alginate;Impregnated gauze (bismuth);Compression wrap    Dressing Changed  Changed    Dressing Status  Clean;Dry;Old drainage    Dressing Change Frequency  PRN    Site / Wound Assessment  Painful;Yellow    % Wound base Red or Granulating  65%    % Wound base Yellow/Fibrinous Exudate  35%    Peri-wound Assessment  Erythema  (blanchable);Hemosiderin    Wound Length (cm)  0.7 cm   .7   Wound Width (cm)  0.7 cm   was .7   Wound Depth (cm)  0.7 cm   .7   Wound Volume (cm^3)  0.34 cm^3    Wound Surface Area (cm^2)  0.49 cm^2    Tunneling (cm)  12'.6; 3' .5; 6' 2.0cm; 9' .4    Undermining (cm)  --    Margins  Unattached edges (unapproximated)    Drainage Amount  Minimal    Drainage Description  Sanguineous    Treatment  Cleansed;Debridement (Selective);Hydrotherapy (Pulse lavage)    Pulsed lavage therapy - wound location  ankle    Pulsed Lavage with Suction (psi)  4 psi    Pulsed Lavage with Suction - Normal Saline Used  1000 mL    Pulsed Lavage Tip  Tip with splash shield    Selective Debridement - Location  wound bed and margins    Selective Debridement - Tools Used  Scalpel   qtips   Selective Debridement - Tissue Removed  slough/ devitalized tissue     Wound Therapy - Clinical Statement  Vast improvements with skin integrity and increased slough removal following change in dressings.  Resumed PLS for cleansing and full removal of alginate.  Measurements taken, continues to have tunneling 2.0cm deep at 6:00.  Wound packed wiht alginate, xeroform on perimeter and profore for edema control    Wound Therapy - Functional Problem List  Pain when walking     Factors Delaying/Impairing Wound Healing  Infection - systemic/local;Vascular compromise    Hydrotherapy Plan  Debridement;Dressing change;Patient/family education;Pulsatile lavage with suction    Wound Therapy - Frequency  3X / week    Wound Therapy - Current Recommendations  PT    Wound Plan  Cleanse and debride wound followed by application of compression.  Measure weekly.     Dressing   alginate packed into wound especially at 6', xeroform perimeter with profore.                 PT Short Term Goals - 04/30/18 1610      PT SHORT TERM GOAL #1   Title  Pt pain to be no greater than a 4/10 to allow pt to be up for over an hour in comfort to  complete household duties.     Time  3    Period  Weeks  Status  On-going      PT SHORT TERM GOAL #2   Title  Pt wound to be 100% granulated to decrease infection     Time  3    Period  Weeks    Status  On-going      PT SHORT TERM GOAL #3   Title  PT wound to to be approximated to 2x1.5 cm    Time  3    Period  Weeks    Status  On-going        PT Long Term Goals - 04/30/18 1610      PT LONG TERM GOAL #1   Title  PT to have no pain in her left ankle to be able to be up for over 2 hours at a time to shop     Time  6    Period  Weeks    Status  On-going      PT LONG TERM GOAL #2   Title  Wound to be healed    Time  6    Period  Weeks    Status  On-going      PT LONG TERM GOAL #3   Title  PT to be wearing compression garment everyday.     Time  6    Period  Weeks    Status  On-going              Patient will benefit from skilled therapeutic intervention in order to improve the following deficits and impairments:     Visit Diagnosis: Pain in left ankle and joints of left foot  Difficulty in walking, not elsewhere classified  Ankle wound, left, sequela  Acute pain of left knee     Problem List Patient Active Problem List   Diagnosis Date Noted  . Unilateral primary osteoarthritis, left knee 05/02/2017  . S/P total knee replacement using cement, left 05/02/2017  . Primary osteoarthritis of left knee 12/21/2016  . Chondromalacia patellae, left knee 05/26/2016  . Breast cancer of upper-inner quadrant of right female breast ([place]) 11/04/2015  . GERD (gastroesophageal reflux disease) 09/02/2013  . Obesity 09/02/2013  . Ventral hernia, recurrent 09/02/2013  . Unspecified asthma(493.90) 12/12/2012  . Cough variant asthma 03/27/2012  . Right middle lobe syndrome 03/27/2012   Ihor Austin, [place]; [place]  Aldona Lento 05/25/2018, 12:32 PM  [facility] [address] [place], [place],  [address] Phone: [phone]   Fax:  [phone]  Name: Caitlin [name] MRN: [id number] Date of Birth: 10-Jan-1946

## 2018-05-28 ENCOUNTER — Ambulatory Visit (HOSPITAL_COMMUNITY)
Admission: RE | Admit: 2018-05-28 | Discharge: 2018-05-28 | Disposition: A | Discharge status: Home / Self Care | Payer: Medicare Other | Source: Ambulatory Visit | Attending: Family Medicine | Admitting: Family Medicine

## 2018-05-28 ENCOUNTER — Other Ambulatory Visit (HOSPITAL_COMMUNITY): Payer: Self-pay | Admitting: Family Medicine

## 2018-05-28 ENCOUNTER — Encounter (HOSPITAL_COMMUNITY): Payer: Self-pay

## 2018-05-28 ENCOUNTER — Other Ambulatory Visit: Payer: Self-pay

## 2018-05-28 ENCOUNTER — Ambulatory Visit (HOSPITAL_COMMUNITY): Payer: Medicare Other

## 2018-05-28 DIAGNOSIS — R262 Difficulty in walking, not elsewhere classified: Secondary | ICD-10-CM | POA: Diagnosis not present

## 2018-05-28 DIAGNOSIS — S52135A Nondisplaced fracture of neck of left radius, initial encounter for closed fracture: Secondary | ICD-10-CM | POA: Diagnosis not present

## 2018-05-28 DIAGNOSIS — S6992XA Unspecified injury of left wrist, hand and finger(s), initial encounter: Secondary | ICD-10-CM | POA: Diagnosis not present

## 2018-05-28 DIAGNOSIS — R945 Abnormal results of liver function studies: Secondary | ICD-10-CM | POA: Diagnosis not present

## 2018-05-28 DIAGNOSIS — Z6835 Body mass index (BMI) 35.0-35.9, adult: Secondary | ICD-10-CM | POA: Diagnosis not present

## 2018-05-28 DIAGNOSIS — S91002S Unspecified open wound, left ankle, sequela: Secondary | ICD-10-CM

## 2018-05-28 DIAGNOSIS — M25572 Pain in left ankle and joints of left foot: Secondary | ICD-10-CM | POA: Diagnosis not present

## 2018-05-28 DIAGNOSIS — M25522 Pain in left elbow: Secondary | ICD-10-CM

## 2018-05-28 DIAGNOSIS — M25532 Pain in left wrist: Secondary | ICD-10-CM

## 2018-05-28 DIAGNOSIS — M19032 Primary osteoarthritis, left wrist: Secondary | ICD-10-CM | POA: Insufficient documentation

## 2018-05-28 DIAGNOSIS — X58XXXA Exposure to other specified factors, initial encounter: Secondary | ICD-10-CM | POA: Insufficient documentation

## 2018-05-28 DIAGNOSIS — M25562 Pain in left knee: Secondary | ICD-10-CM | POA: Diagnosis not present

## 2018-05-28 DIAGNOSIS — E6609 Other obesity due to excess calories: Secondary | ICD-10-CM | POA: Diagnosis not present

## 2018-05-28 NOTE — Therapy (Signed)
[place], [place], [address] Phone: [phone]   Fax:  [phone]  Wound Care Therapy  Patient Details  Name: Caitlin [name] MRN: [id number] Date of Birth: Dec 17, 1945 Referring Provider (PT): Sharilyn Sites    Encounter Date: 05/28/2018  PT End of Session - 05/28/18 1039    Visit Number  17    Number of Visits  27    Date for PT Re-Evaluation  05/28/18    Authorization Type  Medicare A & B    Authorization Time Period  9/30-->05/28/2018; 05/29/18-06/22/18    Authorization - Visit Number  7    Authorization - Number of Visits  10    PT Start Time  0904    PT Stop Time  0948    PT Time Calculation (min)  44 min    Activity Tolerance  Patient tolerated treatment well    Behavior During Therapy  [facility] for tasks assessed/performed       Past Medical History:  Diagnosis Date  . Arthritis   . Asthma   . Breast cancer ([place])   . Breast disorder    cancer  . Breast mass    benign  . Cellulitis   . GERD (gastroesophageal reflux disease)   . Heart murmur    AS CHILD  OUTGREW IT  . History of hiatal hernia   . Hyperlipidemia   . Hypertension   . Impaired fasting glucose   . Rectocele   . Varicose vein of leg     Past Surgical History:  Procedure Laterality Date  . BREAST LUMPECTOMY WITH RADIOACTIVE SEED AND SENTINEL LYMPH NODE BIOPSY Right 11/11/2015   Procedure: RIGHT BREAST RADIOACTIVE SEED GUIDED  LUMPECTOMY AND RADIOACTIVE SEED TARGETED SENTINEL LYMPH NODE BIOPSY;  Surgeon: Stark Klein, MD;  Location: [facility];  Service: General;  Laterality: Right;  . BREAST SURGERY Left    breast biopies x4  . CHOLECYSTECTOMY  09/08/2010  . COLONOSCOPY    . COLONOSCOPY N/A 09/25/2013   Procedure: COLONOSCOPY;  Surgeon: Rogene Houston, MD;  [facility];  Service: Endoscopy;  Laterality: N/A;  100-moved to 1200 Ann to notify pt  . HERNIA REPAIR  12/24/2010, 12-2014   TOTAL 4  . LAPAROSCOPIC  GASTRIC SLEEVE RESECTION  11-2013   at [place]  2007/2008  . TONSILLECTOMY  1968  . TOTAL KNEE ARTHROPLASTY Left 05/02/2017  . TOTAL KNEE ARTHROPLASTY Left 05/02/2017   Procedure: LEFT TOTAL KNEE ARTHROPLASTY;  Surgeon: [name], MD;  Location: [facility];  Service: Orthopedics;  Laterality: Left;  . UPPER GASTROINTESTINAL ENDOSCOPY    . VAGINAL HYSTERECTOMY  1980s    There were no vitals filed for this visit.     Wound Therapy - 05/28/18 1033    Subjective  Patient reports she is doing well and had a good weekend. She states she is going to her doctor today and that she will talk to him about her fall. She reports she fell last week at the hospital while going to an oncology appointment and her Lt forearm is bruised now.     Patient and Family Stated Goals  wound to heal and pain to be gone     Date of Onset  04/05/18    Prior Treatments  medication and self care.     Pain Scale  0-10    Pain Score  0-No pain    Evaluation  and Treatment Procedures Explained to Patient/Family  Yes    Evaluation and Treatment Procedures  agreed to    Wound Properties Date First Assessed: 04/19/18 Time First Assessed: 0955 Wound Type: Venous stasis ulcer Location: Ankle Location Orientation: Left;Medial Present on Admission: Yes   Dressing Type  Alginate;Impregnated gauze (bismuth);Compression wrap    Dressing Changed  Changed    Dressing Status  Clean;Dry;Old drainage    Dressing Change Frequency  PRN    Site / Wound Assessment  Painful;Yellow    % Wound base Red or Granulating  65%    % Wound base Yellow/Fibrinous Exudate  35%    Peri-wound Assessment  Erythema (blanchable);Hemosiderin    Margins  Unattached edges (unapproximated)    Drainage Amount  Minimal    Drainage Description  Sanguineous    Treatment  Cleansed;Debridement (Selective);Hydrotherapy (Pulse lavage)    Pulsed lavage therapy - wound location  ankle    Pulsed Lavage with Suction (psi)  4  psi    Pulsed Lavage with Suction - Normal Saline Used  1000 mL    Pulsed Lavage Tip  Tip with splash shield    Selective Debridement - Location  wound bed and margins    Selective Debridement - Tools Used  Scalpel;Forceps   qtips   Selective Debridement - Tissue Removed  slough/ devitalized tissue     Wound Therapy - Clinical Statement  Patient has improved granulation tissue overall and slough and alginate were removed with forceps and pulsed lavage. Patient continues to have wound tunneling and skin is weak superficial to tunnel at 6:00. Continued to pack with alginate and applied xeroform to periwound to promote health of skin. Continued with profore wrap for edema management.     Wound Therapy - Functional Problem List  Pain when walking     Factors Delaying/Impairing Wound Healing  Infection - systemic/local;Vascular compromise    Hydrotherapy Plan  Debridement;Dressing change;Patient/family education;Pulsatile lavage with suction    Wound Therapy - Frequency  3X / week    Wound Therapy - Current Recommendations  PT    Wound Plan  Cleanse and debride wound followed by application of compression.  Measure weekly.     Dressing   alginate packed into wound especially at 6', xeroform perimeter with profore.          PT Education - 05/28/18 1038    Education Details  Encouraged to get more sturdy shoes rather tahn flip flops as patient Lt shoe does not velcro securly and she states she believes this may have caused her fall last week.     Person(s) Educated  Patient    Methods  Explanation    Comprehension  Verbalized understanding       PT Short Term Goals - 05/28/18 1040      PT SHORT TERM GOAL #1   Title  Pt pain to be no greater than a 4/10 to allow pt to be up for over an hour in comfort to complete household duties.     Time  3    Period  Weeks    Status  On-going      PT SHORT TERM GOAL #2   Title  Pt wound to be 100% granulated to decrease infection     Time  3     Period  Weeks    Status  On-going      PT SHORT TERM GOAL #3   Title  PT wound to to be approximated to 2x1.5 cm  Time  3    Period  Weeks    Status  On-going        PT Long Term Goals - 05/28/18 1041      PT LONG TERM GOAL #1   Title  PT to have no pain in her left ankle to be able to be up for over 2 hours at a time to shop     Time  6    Period  Weeks    Status  On-going      PT LONG TERM GOAL #2   Title  Wound to be healed    Time  6    Period  Weeks    Status  On-going      PT LONG TERM GOAL #3   Title  PT to be wearing compression garment everyday.     Time  6    Period  Weeks    Status  On-going         Plan - 05/28/18 1040    Clinical Impression Statement  see above    Rehab Potential  Good    PT Frequency  3x / week    PT Duration  3 weeks    PT Treatment/Interventions  Other (comment);Compression bandaging   debridement, compression bandaging; pulsed lavage   PT Next Visit Plan  see above     Consulted and Agree with Plan of Care  Patient       Patient will benefit from skilled therapeutic intervention in order to improve the following deficits and impairments:  Increased edema, Difficulty walking, Decreased skin integrity, Pain  Visit Diagnosis: Pain in left ankle and joints of left foot  Difficulty in walking, not elsewhere classified  Ankle wound, left, sequela     Problem List Patient Active Problem List   Diagnosis Date Noted  . Unilateral primary osteoarthritis, left knee 05/02/2017  . S/P total knee replacement using cement, left 05/02/2017  . Primary osteoarthritis of left knee 12/21/2016  . Chondromalacia patellae, left knee 05/26/2016  . Breast cancer of upper-inner quadrant of right female breast ([place]) 11/04/2015  . GERD (gastroesophageal reflux disease) 09/02/2013  . Obesity 09/02/2013  . Ventral hernia, recurrent 09/02/2013  . Unspecified asthma(493.90) 12/12/2012  . Cough variant asthma 03/27/2012  . Right middle lobe  syndrome 03/27/2012    Kipp Brood, PT, DPT Physical Therapist with [facility]  05/28/2018 10:41 AM    [place], [place], [address] Phone: [phone]   Fax:  [phone]  Name: Caitlin [name] MRN: [id number] Date of Birth: [birth year]

## 2018-05-30 ENCOUNTER — Ambulatory Visit (HOSPITAL_COMMUNITY): Payer: Medicare Other | Admitting: Physical Therapy

## 2018-05-30 DIAGNOSIS — S91002S Unspecified open wound, left ankle, sequela: Secondary | ICD-10-CM

## 2018-05-30 DIAGNOSIS — M25572 Pain in left ankle and joints of left foot: Secondary | ICD-10-CM

## 2018-05-30 DIAGNOSIS — R262 Difficulty in walking, not elsewhere classified: Secondary | ICD-10-CM | POA: Diagnosis not present

## 2018-05-30 DIAGNOSIS — M25562 Pain in left knee: Secondary | ICD-10-CM | POA: Diagnosis not present

## 2018-05-30 NOTE — Therapy (Signed)
[place], [place], [address] Phone: [phone]   Fax:  [phone]  Wound Care Therapy  Patient Details  Name: Caitlin [name] MRN: [id number] Date of Birth: 07-15-1946 Referring Provider (PT): Sharilyn Sites    Encounter Date: 05/30/2018  PT End of Session - 05/30/18 1145    Visit Number  18    Number of Visits  27    Date for PT Re-Evaluation  05/28/18    Authorization Type  Medicare A & B    Authorization Time Period  9/30-->05/28/2018; 05/29/18-06/22/18    Authorization - Visit Number  8    Authorization - Number of Visits  10    PT Start Time  0904    PT Stop Time  0950    PT Time Calculation (min)  46 min    Activity Tolerance  Patient tolerated treatment well    Behavior During Therapy  [facility] for tasks assessed/performed       Past Medical History:  Diagnosis Date  . Arthritis   . Asthma   . Breast cancer ([place])   . Breast disorder    cancer  . Breast mass    benign  . Cellulitis   . GERD (gastroesophageal reflux disease)   . Heart murmur    AS CHILD  OUTGREW IT  . History of hiatal hernia   . Hyperlipidemia   . Hypertension   . Impaired fasting glucose   . Rectocele   . Varicose vein of leg     Past Surgical History:  Procedure Laterality Date  . BREAST LUMPECTOMY WITH RADIOACTIVE SEED AND SENTINEL LYMPH NODE BIOPSY Right 11/11/2015   Procedure: RIGHT BREAST RADIOACTIVE SEED GUIDED  LUMPECTOMY AND RADIOACTIVE SEED TARGETED SENTINEL LYMPH NODE BIOPSY;  Surgeon: Stark Klein, MD;  Location: [place];  Service: General;  Laterality: Right;  . BREAST SURGERY Left    breast biopies x4  . CHOLECYSTECTOMY  09/08/2010  . COLONOSCOPY    . COLONOSCOPY N/A 09/25/2013   Procedure: COLONOSCOPY;  Surgeon: Rogene Houston, MD;  [facility];  Service: Endoscopy;  Laterality: N/A;  100-moved to 1200 Ann to notify pt  . HERNIA REPAIR  12/24/2010, 12-2014   TOTAL 4  . LAPAROSCOPIC  GASTRIC SLEEVE RESECTION  11-2013   at [place]  2007/2008  . TONSILLECTOMY  1968  . TOTAL KNEE ARTHROPLASTY Left 05/02/2017  . TOTAL KNEE ARTHROPLASTY Left 05/02/2017   Procedure: LEFT TOTAL KNEE ARTHROPLASTY;  Surgeon: [name], MD;  Location: [place];  Service: Orthopedics;  Laterality: Left;  . UPPER GASTROINTESTINAL ENDOSCOPY    . VAGINAL HYSTERECTOMY  1980s    There were no vitals filed for this visit.              Wound Therapy - 05/30/18 1132    Subjective  Patient reports she is doing well and had a good weekend. She states she is going to her doctor today and that she will talk to him about her fall. She reports she fell last week at the hospital while going to an oncology appointment and her Lt forearm is bruised now.     Patient and Family Stated Goals  wound to heal and pain to be gone     Date of Onset  04/05/18    Prior Treatments  medication and self care.     Pain Scale  0-10  Pain Score  0-No pain    Evaluation and Treatment Procedures Explained to Patient/Family  Yes    Evaluation and Treatment Procedures  agreed to    Wound Properties Date First Assessed: 04/19/18 Time First Assessed: 0955 Wound Type: Venous stasis ulcer Location: Ankle Location Orientation: Left;Medial Present on Admission: Yes   Dressing Type  Alginate;Impregnated gauze (bismuth);Compression wrap    Dressing Changed  Changed    Dressing Status  Clean;Dry;Old drainage    Dressing Change Frequency  PRN    Site / Wound Assessment  Painful;Yellow    % Wound base Red or Granulating  65%    % Wound base Yellow/Fibrinous Exudate  35%    Peri-wound Assessment  Erythema (blanchable);Hemosiderin    Margins  Unattached edges (unapproximated)    Drainage Amount  Minimal    Drainage Description  Sanguineous    Treatment  Hydrotherapy (Pulse lavage);Debridement (Selective)    Pulsed lavage therapy - wound location  ankle    Pulsed Lavage with Suction  (psi)  4 psi    Pulsed Lavage with Suction - Normal Saline Used  1000 mL    Pulsed Lavage Tip  Tip with splash shield    Selective Debridement - Location  wound bed and margins    Selective Debridement - Tools Used  Forceps   qtips   Selective Debridement - Tissue Removed  slough/ devitalized tissue     Wound Therapy - Clinical Statement  Noted with secondary opening inferior to main wound approx 2cm2 in size.  Both openings oozing purluent exudate and noted increase in redness/edema inferiorly.  PL used to thoroughly irrigate with packing of alginate.  Wound culture order faxed to Dr. Hilma Favors.     Wound Therapy - Functional Problem List  Pain when walking     Factors Delaying/Impairing Wound Healing  Infection - systemic/local;Vascular compromise    Hydrotherapy Plan  Debridement;Dressing change;Patient/family education;Pulsatile lavage with suction    Wound Therapy - Frequency  3X / week    Wound Therapy - Current Recommendations  PT    Wound Plan  Cleanse and debride wound followed by application of compression.  Measure weekly.  Complete wound culture once order received.      Dressing   alginate packed into wound,  profore.                 PT Short Term Goals - 05/30/18 1142      PT SHORT TERM GOAL #1   Title  Pt pain to be no greater than a 4/10 to allow pt to be up for over an hour in comfort to complete household duties.     Time  3    Period  Weeks    Status  On-going      PT SHORT TERM GOAL #2   Title  Pt wound to be 100% granulated to decrease infection     Time  3    Period  Weeks    Status  On-going      PT SHORT TERM GOAL #3   Title  PT wound to to be approximated to 2x1.5 cm    Time  3    Period  Weeks    Status  On-going        PT Long Term Goals - 05/28/18 1041      PT LONG TERM GOAL #1   Title  PT to have no pain in her left ankle to be able to be up for over 2  hours at a time to shop     Time  6    Period  Weeks    Status  On-going      PT  LONG TERM GOAL #2   Title  Wound to be healed    Time  6    Period  Weeks    Status  On-going      PT LONG TERM GOAL #3   Title  PT to be wearing compression garment everyday.     Time  6    Period  Weeks    Status  On-going            Plan - 05/30/18 1144    Rehab Potential  Good    PT Frequency  3x / week    PT Duration  3 weeks    PT Treatment/Interventions  Other (comment);Compression bandaging   debridement, compression bandaging; pulsed lavage   PT Next Visit Plan  see above     Consulted and Agree with Plan of Care  Patient       Patient will benefit from skilled therapeutic intervention in order to improve the following deficits and impairments:  Increased edema, Difficulty walking, Decreased skin integrity, Pain  Visit Diagnosis: Pain in left ankle and joints of left foot  Difficulty in walking, not elsewhere classified  Ankle wound, left, sequela     Problem List Patient Active Problem List   Diagnosis Date Noted  . Unilateral primary osteoarthritis, left knee 05/02/2017  . S/P total knee replacement using cement, left 05/02/2017  . Primary osteoarthritis of left knee 12/21/2016  . Chondromalacia patellae, left knee 05/26/2016  . Breast cancer of upper-inner quadrant of right female breast ([place]) 11/04/2015  . GERD (gastroesophageal reflux disease) 09/02/2013  . Obesity 09/02/2013  . Ventral hernia, recurrent 09/02/2013  . Unspecified asthma(493.90) 12/12/2012  . Cough variant asthma 03/27/2012  . Right middle lobe syndrome 03/27/2012   Teena Irani, PTA/CLT [phone]  Teena Irani 05/30/2018, 11:46 AM  [place], [place], [address] Phone: [phone]   Fax:  [phone]  Name: Caitlin [name] MRN: [id number] Date of Birth: [birth year]

## 2018-05-31 ENCOUNTER — Ambulatory Visit (INDEPENDENT_AMBULATORY_CARE_PROVIDER_SITE_OTHER): Payer: Medicare Other | Admitting: Orthopaedic Surgery

## 2018-05-31 ENCOUNTER — Encounter (INDEPENDENT_AMBULATORY_CARE_PROVIDER_SITE_OTHER): Payer: Self-pay | Admitting: Orthopaedic Surgery

## 2018-05-31 ENCOUNTER — Other Ambulatory Visit: Payer: Medicare Other | Admitting: Vascular Surgery

## 2018-05-31 VITALS — BP 128/71 | HR 96 | Ht 62.0 in | Wt 202.0 lb

## 2018-05-31 DIAGNOSIS — M25522 Pain in left elbow: Secondary | ICD-10-CM

## 2018-05-31 NOTE — Progress Notes (Signed)
Office Visit Note   Patient: Caitlin [name]           Date of Birth: 12/22/1945           MRN: [id number] Visit Date: 05/31/2018              Requested by: Sharilyn Sites, [place], [place] [address] PCP: Sharilyn Sites, MD   Assessment & Plan: Visit Diagnoses:  1. Pain in left elbow     Plan: Nondisplaced left radial head fracture x1 week.  Wear sling.  Work with range of motion exercises.  Repeat films in about 3 weeks  Follow-Up Instructions: Return in about 3 weeks (around 06/21/2018).   Orders:  No orders of the defined types were placed in this encounter.  No orders of the defined types were placed in this encounter.     Procedures: No procedures performed   Clinical Data: No additional findings.   Subjective: Chief Complaint  Patient presents with  . Follow-up    05/28/2018 L ELBOW FX, PT FELL IN HALLWAY 1 WEEK AGO, WENT TO DR  4 DAYS LATER AND HAD XRAYS DONE   Caitlin [name] is 72 years old accompanied by her husband and here for evaluation of left elbow pain.  She fell last week with immediate onset of left elbow pain.  Subsequent films demonstrate a nondisplaced left radial head fracture.  She is been wearing a sling and working with range of motion exercises.  She is feeling much better but still having a bit of soreness and tenderness.  No numbness or tingling.  She also has had a chronic problem with cellulitis and ulcer formation related to venous insufficiency distally in her left leg.  That also is the side where she is had a prior left knee replacement.  She is been followed in the wound clinic but just had some concerned about the healing.  I had suggested that in addition to the vascular surgery consultation that she is had she might want to check with infectious disease and have her primary care physician who is familiar with her case and has all of her records make the referral. I did review films of her elbow from a disc that  accompanies her and agree with a nondisplaced fracture of the left radial head  HPI  Review of Systems  Constitutional: Negative for fatigue and fever.  HENT: Negative for ear pain.   Eyes: Negative for pain.  Respiratory: Negative for cough and shortness of breath.   Cardiovascular: Negative for leg swelling.  Gastrointestinal: Negative for constipation and diarrhea.  Genitourinary: Negative for difficulty urinating.  Musculoskeletal: Negative for back pain and neck pain.  Skin: Negative for rash.  Neurological: Negative for weakness and numbness.  Hematological: Does not bruise/bleed easily.  Psychiatric/Behavioral: Negative for sleep disturbance.     Objective: Vital Signs: BP 128/71 (BP Location: Left Arm, Patient Position: Sitting, Cuff Size: Normal)   Pulse 96   Ht 5\' 2"  (1.575 m)   Wt 202 lb (91.6 kg)   BMI 36.95 kg/m   Physical Exam  Constitutional: She is oriented to person, place, and time. She appears well-developed and well-nourished.  HENT:  Mouth/Throat: Oropharynx is clear and moist.  Eyes: Pupils are equal, round, and reactive to light. EOM are normal.  Pulmonary/Chest: Effort normal.  Neurological: She is alert and oriented to person, place, and time.  Skin: Skin is warm and dry.  Psychiatric: She has a normal mood and affect.  Her behavior is normal.    Ortho Exam awake alert and oriented x3.  Comfortable sitting full flexion and just about full extension of left elbow.  Some swelling around the elbow with resolving ecchymosis.  Neurovascular exam intact.  No wrist pain and some limited pronation and supination of about 60% with pain over the radial head.  Specialty Comments:  No specialty comments available.  Imaging: No results found.   PMFS History: Patient Active Problem List   Diagnosis Date Noted  . Unilateral primary osteoarthritis, left knee 05/02/2017  . S/P total knee replacement using cement, left 05/02/2017  . Primary osteoarthritis of  left knee 12/21/2016  . Chondromalacia patellae, left knee 05/26/2016  . Breast cancer of upper-inner quadrant of right female breast ([place]) 11/04/2015  . GERD (gastroesophageal reflux disease) 09/02/2013  . Obesity 09/02/2013  . Ventral hernia, recurrent 09/02/2013  . Unspecified asthma(493.90) 12/12/2012  . Cough variant asthma 03/27/2012  . Right middle lobe syndrome 03/27/2012   Past Medical History:  Diagnosis Date  . Arthritis   . Asthma   . Breast cancer ([place])   . Breast disorder    cancer  . Breast mass    benign  . Cellulitis   . GERD (gastroesophageal reflux disease)   . Heart murmur    AS CHILD  OUTGREW IT  . History of hiatal hernia   . Hyperlipidemia   . Hypertension   . Impaired fasting glucose   . Rectocele   . Varicose vein of leg     Family History  Problem Relation Age of Onset  . Colon cancer Father   . Rectal cancer Father   . Prostate cancer Father   . Dementia Mother   . Osteoporosis Mother   . Healthy Daughter   . Healthy Son   . Pancreatic cancer Paternal Grandfather   . Heart disease Paternal Grandmother   . Heart disease Maternal Grandfather   . Breast cancer Maternal Grandmother   . Scoliosis Sister     Past Surgical History:  Procedure Laterality Date  . BREAST LUMPECTOMY WITH RADIOACTIVE SEED AND SENTINEL LYMPH NODE BIOPSY Right 11/11/2015   Procedure: RIGHT BREAST RADIOACTIVE SEED GUIDED  LUMPECTOMY AND RADIOACTIVE SEED TARGETED SENTINEL LYMPH NODE BIOPSY;  Surgeon: Stark Klein, MD;  Location: [facility];  Service: General;  Laterality: Right;  . BREAST SURGERY Left    breast biopies x4  . CHOLECYSTECTOMY  09/08/2010  . COLONOSCOPY    . COLONOSCOPY N/A 09/25/2013   Procedure: COLONOSCOPY;  Surgeon: Rogene Houston, MD;  [facility];  Service: Endoscopy;  Laterality: N/A;  100-moved to 1200 Ann to notify pt  . HERNIA REPAIR  12/24/2010, 12-2014   TOTAL 4  . LAPAROSCOPIC GASTRIC SLEEVE RESECTION  11-2013   at  [place]  2007/2008  . TONSILLECTOMY  1968  . TOTAL KNEE ARTHROPLASTY Left 05/02/2017  . TOTAL KNEE ARTHROPLASTY Left 05/02/2017   Procedure: LEFT TOTAL KNEE ARTHROPLASTY;  Surgeon: [name], MD;  Location: [place];  Service: Orthopedics;  Laterality: Left;  . UPPER GASTROINTESTINAL ENDOSCOPY    . VAGINAL HYSTERECTOMY  1980s   Social History   Occupational History  . Occupation: retired    Fish farm manager: RETIRED    Comment: teacher  Tobacco Use  . Smoking status: Never Smoker  . Smokeless tobacco: Never Used  Substance and Sexual Activity  . Alcohol use: No  . Drug use: No  . Sexual activity:  Yes    Birth control/protection: Surgical    Comment: hyst

## 2018-06-01 ENCOUNTER — Ambulatory Visit (HOSPITAL_COMMUNITY): Payer: Medicare Other | Attending: Family Medicine

## 2018-06-01 ENCOUNTER — Other Ambulatory Visit (HOSPITAL_COMMUNITY)
Admission: RE | Admit: 2018-06-01 | Discharge: 2018-06-01 | Disposition: A | Discharge status: Home / Self Care | Payer: Medicare Other | Source: Ambulatory Visit | Attending: Family Medicine | Admitting: Family Medicine

## 2018-06-01 ENCOUNTER — Encounter (HOSPITAL_COMMUNITY): Payer: Self-pay

## 2018-06-01 DIAGNOSIS — S91002S Unspecified open wound, left ankle, sequela: Secondary | ICD-10-CM | POA: Diagnosis not present

## 2018-06-01 DIAGNOSIS — S81802A Unspecified open wound, left lower leg, initial encounter: Secondary | ICD-10-CM | POA: Insufficient documentation

## 2018-06-01 DIAGNOSIS — R262 Difficulty in walking, not elsewhere classified: Secondary | ICD-10-CM | POA: Diagnosis not present

## 2018-06-01 DIAGNOSIS — M25572 Pain in left ankle and joints of left foot: Secondary | ICD-10-CM | POA: Diagnosis not present

## 2018-06-01 NOTE — Therapy (Signed)
[place], [place], [address] Phone: [phone]   Fax:  [phone]  Wound Care Therapy  Patient Details  Name: Caitlin [name] MRN: [id number] Date of Birth: 03-Mar-1946 Referring Provider (PT): Sharilyn Sites    Encounter Date: 06/01/2018  PT End of Session - 06/01/18 0952    Visit Number  19    Number of Visits  27    Date for PT Re-Evaluation  05/28/18    Authorization Type  Medicare A & B    Authorization Time Period  9/30-->05/28/2018; 05/29/18-06/22/18    Authorization - Visit Number  9    Authorization - Number of Visits  10    PT Start Time  0904    PT Stop Time  0942    PT Time Calculation (min)  38 min    Activity Tolerance  Patient tolerated treatment well    Behavior During Therapy  [facility] for tasks assessed/performed       Past Medical History:  Diagnosis Date  . Arthritis   . Asthma   . Breast cancer ([place])   . Breast disorder    cancer  . Breast mass    benign  . Cellulitis   . GERD (gastroesophageal reflux disease)   . Heart murmur    AS CHILD  OUTGREW IT  . History of hiatal hernia   . Hyperlipidemia   . Hypertension   . Impaired fasting glucose   . Rectocele   . Varicose vein of leg     Past Surgical History:  Procedure Laterality Date  . BREAST LUMPECTOMY WITH RADIOACTIVE SEED AND SENTINEL LYMPH NODE BIOPSY Right 11/11/2015   Procedure: RIGHT BREAST RADIOACTIVE SEED GUIDED  LUMPECTOMY AND RADIOACTIVE SEED TARGETED SENTINEL LYMPH NODE BIOPSY;  Surgeon: Stark Klein, MD;  Location: [place];  Service: General;  Laterality: Right;  . BREAST SURGERY Left    breast biopies x4  . CHOLECYSTECTOMY  09/08/2010  . COLONOSCOPY    . COLONOSCOPY N/A 09/25/2013   Procedure: COLONOSCOPY;  Surgeon: Rogene Houston, MD;  [facility];  Service: Endoscopy;  Laterality: N/A;  100-moved to 1200 Ann to notify pt  . HERNIA REPAIR  12/24/2010, 12-2014   TOTAL 4  . LAPAROSCOPIC  GASTRIC SLEEVE RESECTION  11-2013   at [place]  2007/2008  . TONSILLECTOMY  1968  . TOTAL KNEE ARTHROPLASTY Left 05/02/2017  . TOTAL KNEE ARTHROPLASTY Left 05/02/2017   Procedure: LEFT TOTAL KNEE ARTHROPLASTY;  Surgeon: [name], MD;  Location: [place];  Service: Orthopedics;  Laterality: Left;  . UPPER GASTROINTESTINAL ENDOSCOPY    . VAGINAL HYSTERECTOMY  1980s    There were no vitals filed for this visit.   Subjective Assessment - 06/01/18 1128    Subjective  Pt arrived with dressings intact.  Reports she has gotten referral with infection control specialist, waiting on apt time.      Currently in Pain?  No/denies                Wound Therapy - 06/01/18 1130    Subjective  Pt arrived with dressings intact.  Reports she has gotten referral with infection control specialist, waiting on apt time.      Patient and Family Stated Goals  wound to heal and pain to be gone     Date of Onset  04/05/18    Prior Treatments  medication and self care.  Pain Scale  0-10    Pain Score  0-No pain    Evaluation and Treatment Procedures Explained to Patient/Family  Yes    Evaluation and Treatment Procedures  agreed to    Wound Properties Date First Assessed: 04/19/18 Time First Assessed: 0955 Wound Type: Venous stasis ulcer Location: Ankle Location Orientation: Left;Medial Present on Admission: Yes   Dressing Type  Alginate;Impregnated gauze (bismuth);Compression wrap    Dressing Changed  Changed    Dressing Status  Clean;Dry;Old drainage    Dressing Change Frequency  PRN    Site / Wound Assessment  Painful;Yellow    % Wound base Red or Granulating  75%    % Wound base Yellow/Fibrinous Exudate  25%    Peri-wound Assessment  Erythema (blanchable);Hemosiderin    Wound Length (cm)  0.7 cm    Wound Width (cm)  0.7 cm    Wound Depth (cm)  --   unknown   Wound Surface Area (cm^2)  0.49 cm^2    Tunneling (cm)  only tunneling at 6:00- 2 cm     Margins  Unattached edges (unapproximated)    Drainage Amount  Minimal    Drainage Description  Sanguineous    Treatment  Hydrotherapy (Pulse lavage);Cleansed;Debridement (Selective)    Pulsed lavage therapy - wound location  ankle    Pulsed Lavage with Suction (psi)  4 psi    Pulsed Lavage with Suction - Normal Saline Used  1000 mL    Pulsed Lavage Tip  Tip with splash shield    Selective Debridement - Location  wound bed and margins    Selective Debridement - Tools Used  Forceps   qtips   Selective Debridement - Tissue Removed  slough/ devitalized tissue     Wound Therapy - Clinical Statement  Called MD office this morning and recieved signed referral for wound culture.  Continues to have purluent exudate from both openings.  Continued with selective debridement for removal of slough with vast improvements wiht reduction of slough.  Culture complete following debridement and cleansing.  Continued with packed alginate and profore compression wrap for edema control.      Wound Therapy - Functional Problem List  Pain when walking     Factors Delaying/Impairing Wound Healing  Infection - systemic/local;Vascular compromise    Hydrotherapy Plan  Debridement;Dressing change;Patient/family education;Pulsatile lavage with suction    Wound Therapy - Frequency  3X / week    Wound Therapy - Current Recommendations  PT    Wound Plan  Cleanse and debride wound followed by application of compression.  Measure weekly.      Dressing   alginate packed into wound,  profore.                 PT Short Term Goals - 05/30/18 1142      PT SHORT TERM GOAL #1   Title  Pt pain to be no greater than a 4/10 to allow pt to be up for over an hour in comfort to complete household duties.     Time  3    Period  Weeks    Status  On-going      PT SHORT TERM GOAL #2   Title  Pt wound to be 100% granulated to decrease infection     Time  3    Period  Weeks    Status  On-going      PT SHORT TERM GOAL #3    Title  PT wound to to be approximated to  2x1.5 cm    Time  3    Period  Weeks    Status  On-going        PT Long Term Goals - 05/28/18 1041      PT LONG TERM GOAL #1   Title  PT to have no pain in her left ankle to be able to be up for over 2 hours at a time to shop     Time  6    Period  Weeks    Status  On-going      PT LONG TERM GOAL #2   Title  Wound to be healed    Time  6    Period  Weeks    Status  On-going      PT LONG TERM GOAL #3   Title  PT to be wearing compression garment everyday.     Time  6    Period  Weeks    Status  On-going              Patient will benefit from skilled therapeutic intervention in order to improve the following deficits and impairments:     Visit Diagnosis: Pain in left ankle and joints of left foot  Difficulty in walking, not elsewhere classified  Ankle wound, left, sequela     Problem List Patient Active Problem List   Diagnosis Date Noted  . Unilateral primary osteoarthritis, left knee 05/02/2017  . S/P total knee replacement using cement, left 05/02/2017  . Primary osteoarthritis of left knee 12/21/2016  . Chondromalacia patellae, left knee 05/26/2016  . Breast cancer of upper-inner quadrant of right female breast ([place]) 11/04/2015  . GERD (gastroesophageal reflux disease) 09/02/2013  . Obesity 09/02/2013  . Ventral hernia, recurrent 09/02/2013  . Unspecified asthma(493.90) 12/12/2012  . Cough variant asthma 03/27/2012  . Right middle lobe syndrome 03/27/2012   Ihor Austin, LPTA; [place]  Aldona Lento 06/01/2018, 12:27 PM  [place] [address] [place], [place], [address] Phone: [phone]   Fax:  [phone]  Name: Caitlin [name] MRN: [id number] Date of Birth: [birth year]

## 2018-06-04 ENCOUNTER — Ambulatory Visit (HOSPITAL_COMMUNITY): Payer: Medicare Other | Admitting: Physical Therapy

## 2018-06-04 ENCOUNTER — Telehealth (HOSPITAL_COMMUNITY): Payer: Self-pay | Admitting: Physical Therapy

## 2018-06-04 ENCOUNTER — Encounter (HOSPITAL_COMMUNITY): Payer: Self-pay | Admitting: Physical Therapy

## 2018-06-04 DIAGNOSIS — S91002S Unspecified open wound, left ankle, sequela: Secondary | ICD-10-CM

## 2018-06-04 DIAGNOSIS — M25572 Pain in left ankle and joints of left foot: Secondary | ICD-10-CM | POA: Diagnosis not present

## 2018-06-04 DIAGNOSIS — R262 Difficulty in walking, not elsewhere classified: Secondary | ICD-10-CM

## 2018-06-04 LAB — AEROBIC CULTURE  (SUPERFICIAL SPECIMEN)

## 2018-06-04 LAB — AEROBIC CULTURE W GRAM STAIN (SUPERFICIAL SPECIMEN)

## 2018-06-04 NOTE — Telephone Encounter (Signed)
Pt called back and cx the 13 and requested to come in on Monday @9am . NF 06/04/2018

## 2018-06-04 NOTE — Therapy (Signed)
[place], [place], [address] Phone: [phone]   Fax:  [phone]  Wound Care Therapy  Patient Details  Name: Caitlin [name] MRN: [id number] Date of Birth: 1946/05/29 Referring Provider (PT): Sharilyn Sites    Encounter Date: 06/04/2018 Progress Note Reporting Period 05/09/2018  to 06/04/2018  See note below for Objective Data and Assessment of Progress/Goals.      PT End of Session - 06/04/18 1602    Visit Number  20    Number of Visits  28    Date for PT Re-Evaluation  05/28/18    Authorization Type  Medicare A & B    Authorization Time Period  9/30-->05/28/2018; 05/29/18-06/22/18    Authorization - Visit Number  9    Authorization - Number of Visits  10    PT Start Time  1300    PT Stop Time  1340    PT Time Calculation (min)  40 min    Activity Tolerance  Patient tolerated treatment well    Behavior During Therapy  [facility] for tasks assessed/performed       Past Medical History:  Diagnosis Date  . Arthritis   . Asthma   . Breast cancer ([place])   . Breast disorder    cancer  . Breast mass    benign  . Cellulitis   . GERD (gastroesophageal reflux disease)   . Heart murmur    AS CHILD  OUTGREW IT  . History of hiatal hernia   . Hyperlipidemia   . Hypertension   . Impaired fasting glucose   . Rectocele   . Varicose vein of leg     Past Surgical History:  Procedure Laterality Date  . BREAST LUMPECTOMY WITH RADIOACTIVE SEED AND SENTINEL LYMPH NODE BIOPSY Right 11/11/2015   Procedure: RIGHT BREAST RADIOACTIVE SEED GUIDED  LUMPECTOMY AND RADIOACTIVE SEED TARGETED SENTINEL LYMPH NODE BIOPSY;  Surgeon: Stark Klein, MD;  Location: [facility];  Service: General;  Laterality: Right;  . BREAST SURGERY Left    breast biopies x4  . CHOLECYSTECTOMY  09/08/2010  . COLONOSCOPY    . COLONOSCOPY N/A 09/25/2013   Procedure: COLONOSCOPY;  Surgeon: Rogene Houston, MD;  [facility];   Service: Endoscopy;  Laterality: N/A;  100-moved to 1200 Ann to notify pt  . HERNIA REPAIR  12/24/2010, 12-2014   TOTAL 4  . LAPAROSCOPIC GASTRIC SLEEVE RESECTION  11-2013   at [place]  2007/2008  . TONSILLECTOMY  1968  . TOTAL KNEE ARTHROPLASTY Left 05/02/2017  . TOTAL KNEE ARTHROPLASTY Left 05/02/2017   Procedure: LEFT TOTAL KNEE ARTHROPLASTY;  Surgeon: [name], MD;  Location: [facility];  Service: Orthopedics;  Laterality: Left;  . UPPER GASTROINTESTINAL ENDOSCOPY    . VAGINAL HYSTERECTOMY  1980s    There were no vitals filed for this visit.              Wound Therapy - 06/04/18 1508    Subjective  PT states that she has not heard from her MD yet.   Therapist stated that she could call office to find out the results of her culture.     Patient and Family Stated Goals  wound to heal and pain to be gone     Date of Onset  04/05/18    Prior Treatments  medication and self care.     Pain Scale  0-10    Pain  Score  0-No pain    Evaluation and Treatment Procedures Explained to Patient/Family  Yes    Evaluation and Treatment Procedures  agreed to    Wound Properties Date First Assessed: 04/19/18 Time First Assessed: 0955 Wound Type: Venous stasis ulcer Location: Ankle Location Orientation: Left;Medial Present on Admission: Yes   Dressing Type  Alginate;Impregnated gauze (bismuth);Compression wrap    Dressing Status  Clean;Dry;Old drainage    Dressing Change Frequency  PRN    Site / Wound Assessment  Red   was painful and yellow on visit 10    % Wound base Red or Granulating  90%   visit 10 was 15% granulated   % Wound base Yellow/Fibrinous Exudate  10%   visit 10 was 85 % slough   Peri-wound Assessment  Hemosiderin   did have edema as well as redness   Wound Length (cm)  0.6 cm   was .8 on 10th visit    Wound Width (cm)  0.5 cm   visit 10 was 1.0 cm    Wound Depth (cm)  0.7 cm   was .7   Wound Volume (cm^3)  0.21 cm^3    Wound  Surface Area (cm^2)  0.3 cm^2    Tunneling (cm)  tunneling inferior .7cm to right below small new opening     Margins  Epibole (rolled edges)    Drainage Amount  Minimal    Drainage Description  Sanguineous    Treatment  Debridement (Selective);Hydrotherapy (Pulse lavage)    Pulsed lavage therapy - wound location  ankle    Pulsed Lavage with Suction (psi)  4 psi    Pulsed Lavage with Suction - Normal Saline Used  1000 mL    Pulsed Lavage Tip  Tip with splash shield    Selective Debridement - Location  wound bed and margins    Selective Debridement - Tools Used  Forceps   qtips   Selective Debridement - Tissue Removed  epiboled edges     Wound Therapy - Clinical Statement  Pt second wound which was mentioned on 05/30/2018 has decreased in size significantly.   The tunneling from the first wound, however, tunnels to underneath this wound.  Pt will continue to benefit from skilled physical therapy to ensure a healthy wound environment is maintained.  Pt wound is now clean therefore we will decrease to two times a week.  If wound continues to do well we will be able to decrease to one time a week.     Wound Therapy - Functional Problem List  Pain when walking     Factors Delaying/Impairing Wound Healing  Infection - systemic/local;Vascular compromise    Hydrotherapy Plan  Debridement;Dressing change;Patient/family education;Pulsatile lavage with suction    Wound Therapy - Frequency  3X / week    Wound Therapy - Current Recommendations  PT    Wound Plan  Cleanse and debride wound followed by application of compression.  Measure weekly.      Dressing   alginate packed into wound,  profore.                 PT Short Term Goals - 06/04/18 1603      PT SHORT TERM GOAL #1   Title  Pt pain to be no greater than a 4/10 to allow pt to be up for over an hour in comfort to complete household duties.     Time  3    Period  Weeks    Status  Achieved  PT SHORT TERM GOAL #2   Title  Pt  wound to be 100% granulated to decrease infection     Time  3    Period  Weeks    Status  Achieved      PT SHORT TERM GOAL #3   Title  PT wound to to be approximated to 2x1.5 cm    Time  3    Period  Weeks    Status  On-going        PT Long Term Goals - 06/04/18 1604      PT LONG TERM GOAL #1   Title  PT to have no pain in her left ankle to be able to be up for over 2 hours at a time to shop     Time  6    Period  Weeks    Status  On-going      PT LONG TERM GOAL #2   Title  Wound to be healed    Time  6    Period  Weeks    Status  On-going      PT LONG TERM GOAL #3   Title  PT to be wearing compression garment everyday.     Time  6    Period  Weeks    Status  On-going            Plan - 06/04/18 1603    Clinical Impression Statement  as above     Rehab Potential  Good    PT Frequency  2x / week    PT Duration  4 weeks    PT Treatment/Interventions  Other (comment);Compression bandaging   debridement, compression bandaging; pulsed lavage   PT Next Visit Plan  see above     Consulted and Agree with Plan of Care  Patient       Patient will benefit from skilled therapeutic intervention in order to improve the following deficits and impairments:  Increased edema, Difficulty walking, Decreased skin integrity, Pain  Visit Diagnosis: Difficulty in walking, not elsewhere classified  Ankle wound, left, sequela     Problem List Patient Active Problem List   Diagnosis Date Noted  . Unilateral primary osteoarthritis, left knee 05/02/2017  . S/P total knee replacement using cement, left 05/02/2017  . Primary osteoarthritis of left knee 12/21/2016  . Chondromalacia patellae, left knee 05/26/2016  . Breast cancer of upper-inner quadrant of right female breast ([place]) 11/04/2015  . GERD (gastroesophageal reflux disease) 09/02/2013  . Obesity 09/02/2013  . Ventral hernia, recurrent 09/02/2013  . Unspecified asthma(493.90) 12/12/2012  . Cough variant asthma  03/27/2012  . Right middle lobe syndrome 03/27/2012  Rayetta Humphrey, PT CLT [phone] 06/04/2018, 4:04 PM  [facility] [address] [place], [place], [address] Phone: [phone]   Fax:  [phone]  Name: Caitlin [name] MRN: [id number] Date of Birth: September 21, 1945

## 2018-06-05 ENCOUNTER — Ambulatory Visit (HOSPITAL_COMMUNITY): Payer: Medicare Other

## 2018-06-06 ENCOUNTER — Ambulatory Visit (INDEPENDENT_AMBULATORY_CARE_PROVIDER_SITE_OTHER): Payer: Medicare Other | Admitting: Orthopaedic Surgery

## 2018-06-06 ENCOUNTER — Ambulatory Visit (HOSPITAL_COMMUNITY): Payer: Medicare Other

## 2018-06-07 ENCOUNTER — Ambulatory Visit (HOSPITAL_COMMUNITY): Payer: Medicare Other

## 2018-06-07 ENCOUNTER — Encounter (HOSPITAL_COMMUNITY): Payer: Self-pay

## 2018-06-07 ENCOUNTER — Other Ambulatory Visit: Payer: Self-pay

## 2018-06-07 DIAGNOSIS — S91002S Unspecified open wound, left ankle, sequela: Secondary | ICD-10-CM

## 2018-06-07 DIAGNOSIS — R262 Difficulty in walking, not elsewhere classified: Secondary | ICD-10-CM

## 2018-06-07 DIAGNOSIS — M25572 Pain in left ankle and joints of left foot: Secondary | ICD-10-CM

## 2018-06-07 NOTE — Therapy (Signed)
[place], [place], [address] Phone: [phone]   Fax:  [phone]  Wound Care Therapy  Patient Details  Name: Caitlin [name] MRN: [id number] Date of Birth: 1946/04/20 Referring Provider (PT): Sharilyn Sites    Encounter Date: 06/07/2018  PT End of Session - 06/07/18 1758    Visit Number  21    Number of Visits  28    Date for PT Re-Evaluation  05/28/18    Authorization Type  Medicare A & B    Authorization Time Period  9/30-->05/28/2018; 05/29/18-06/22/18    Authorization - Visit Number  1    Authorization - Number of Visits  10    PT Start Time  1017    PT Stop Time  1430    PT Time Calculation (min)  42 min    Activity Tolerance  Patient tolerated treatment well    Behavior During Therapy  [facility] for tasks assessed/performed       Past Medical History:  Diagnosis Date  . Arthritis   . Asthma   . Breast cancer ([place])   . Breast disorder    cancer  . Breast mass    benign  . Cellulitis   . GERD (gastroesophageal reflux disease)   . Heart murmur    AS CHILD  OUTGREW IT  . History of hiatal hernia   . Hyperlipidemia   . Hypertension   . Impaired fasting glucose   . Rectocele   . Varicose vein of leg     Past Surgical History:  Procedure Laterality Date  . BREAST LUMPECTOMY WITH RADIOACTIVE SEED AND SENTINEL LYMPH NODE BIOPSY Right 11/11/2015   Procedure: RIGHT BREAST RADIOACTIVE SEED GUIDED  LUMPECTOMY AND RADIOACTIVE SEED TARGETED SENTINEL LYMPH NODE BIOPSY;  Surgeon: Stark Klein, MD;  Location: [facility];  Service: General;  Laterality: Right;  . BREAST SURGERY Left    breast biopies x4  . CHOLECYSTECTOMY  09/08/2010  . COLONOSCOPY    . COLONOSCOPY N/A 09/25/2013   Procedure: COLONOSCOPY;  Surgeon: Rogene Houston, MD;  [facility];  Service: Endoscopy;  Laterality: N/A;  100-moved to 1200 Ann to notify pt  . HERNIA REPAIR  12/24/2010, 12-2014   TOTAL 4  . LAPAROSCOPIC  GASTRIC SLEEVE RESECTION  11-2013   at [place]  2007/2008  . TONSILLECTOMY  1968  . TOTAL KNEE ARTHROPLASTY Left 05/02/2017  . TOTAL KNEE ARTHROPLASTY Left 05/02/2017   Procedure: LEFT TOTAL KNEE ARTHROPLASTY;  Surgeon: [name], MD;  Location: [facility];  Service: Orthopedics;  Laterality: Left;  . UPPER GASTROINTESTINAL ENDOSCOPY    . VAGINAL HYSTERECTOMY  1980s    There were no vitals filed for this visit.    Wound Therapy - 06/07/18 1752    Subjective  Patient reports she is doing well and that her wound culture came back positive for MRSA.     Patient and Family Stated Goals  wound to heal and pain to be gone     Date of Onset  04/05/18    Prior Treatments  medication and self care.     Pain Scale  0-10    Pain Score  0-No pain    Evaluation and Treatment Procedures Explained to Patient/Family  Yes    Evaluation and Treatment Procedures  agreed to    Wound Properties Date First Assessed: 04/19/18 Time First Assessed: 5102 Wound Type: Venous stasis ulcer Location:  Ankle Location Orientation: Left;Medial Present on Admission: Yes   Dressing Type  Alginate;Impregnated gauze (bismuth);Compression wrap    Dressing Changed  Changed    Dressing Status  Old drainage;Clean;Dry    Dressing Change Frequency  PRN    Site / Wound Assessment  Red   was painful and yellow on visit 10    % Wound base Red or Granulating  90%   visit 10 was 15% granulated   % Wound base Yellow/Fibrinous Exudate  10%   visit 10 was 85 % slough   Peri-wound Assessment  Hemosiderin   did have edema as well as redness   Margins  Epibole (rolled edges)    Drainage Amount  Minimal    Drainage Description  Sanguineous    Treatment  Cleansed;Debridement (Selective)    Pulsed lavage therapy - wound location  --    Pulsed Lavage with Suction (psi)  --    Pulsed Lavage with Suction - Normal Saline Used  --    Pulsed Lavage Tip  --    Selective Debridement - Location   wound bed and margins    Selective Debridement - Tools Used  Forceps    Selective Debridement - Tissue Removed  epiboled edges     Wound Therapy - Clinical Statement  Patients wound continues to have improved granulation and slough easily removed. Second wound mentioned on 05/30/18 had completely closed this date and drainage from tunnel into primary wound was serous. Margins remain epiboled requiring debridement, patient is sensitive to this reporting pain with selective debridement. Packed wound with alginate and continued with profore warp.     Wound Therapy - Functional Problem List  Pain when walking     Factors Delaying/Impairing Wound Healing  Infection - systemic/local;Vascular compromise    Hydrotherapy Plan  Debridement;Dressing change;Patient/family education;Pulsatile lavage with suction    Wound Therapy - Frequency  3X / week    Wound Therapy - Current Recommendations  PT    Wound Plan  Cleanse and debride wound followed by application of compression.  Measure weekly.      Dressing   alginate packed into wound,  profore.          PT Short Term Goals - 06/04/18 1603      PT SHORT TERM GOAL #1   Title  Pt pain to be no greater than a 4/10 to allow pt to be up for over an hour in comfort to complete household duties.     Time  3    Period  Weeks    Status  Achieved      PT SHORT TERM GOAL #2   Title  Pt wound to be 100% granulated to decrease infection     Time  3    Period  Weeks    Status  Achieved      PT SHORT TERM GOAL #3   Title  PT wound to to be approximated to 2x1.5 cm    Time  3    Period  Weeks    Status  On-going        PT Long Term Goals - 06/04/18 1604      PT LONG TERM GOAL #1   Title  PT to have no pain in her left ankle to be able to be up for over 2 hours at a time to shop     Time  6    Period  Weeks    Status  On-going  PT LONG TERM GOAL #2   Title  Wound to be healed    Time  6    Period  Weeks    Status  On-going      PT LONG  TERM GOAL #3   Title  PT to be wearing compression garment everyday.     Time  6    Period  Weeks    Status  On-going        Plan - 06/07/18 1759    Clinical Impression Statement  see above    Rehab Potential  Good    PT Frequency  2x / week    PT Duration  4 weeks    PT Treatment/Interventions  Other (comment);Compression bandaging   debridement, compression bandaging; pulsed lavage   PT Next Visit Plan  see above     Consulted and Agree with Plan of Care  Patient       Patient will benefit from skilled therapeutic intervention in order to improve the following deficits and impairments:  Increased edema, Difficulty walking, Decreased skin integrity, Pain  Visit Diagnosis: Pain in left ankle and joints of left foot  Difficulty in walking, not elsewhere classified  Ankle wound, left, sequela     Problem List Patient Active Problem List   Diagnosis Date Noted  . Unilateral primary osteoarthritis, left knee 05/02/2017  . S/P total knee replacement using cement, left 05/02/2017  . Primary osteoarthritis of left knee 12/21/2016  . Chondromalacia patellae, left knee 05/26/2016  . Breast cancer of upper-inner quadrant of right female breast ([place]) 11/04/2015  . GERD (gastroesophageal reflux disease) 09/02/2013  . Obesity 09/02/2013  . Ventral hernia, recurrent 09/02/2013  . Unspecified asthma(493.90) 12/12/2012  . Cough variant asthma 03/27/2012  . Right middle lobe syndrome 03/27/2012    Kipp Brood, PT, DPT Physical Therapist with [facility]  06/07/2018 6:00 PM    [place], [place], [address] Phone: [phone]   Fax:  [phone]  Name: Caitlin [name] MRN: [id number] Date of Birth: [birth year]

## 2018-06-08 ENCOUNTER — Encounter

## 2018-06-11 ENCOUNTER — Ambulatory Visit (HOSPITAL_COMMUNITY): Payer: Medicare Other | Admitting: Physical Therapy

## 2018-06-11 DIAGNOSIS — S91002S Unspecified open wound, left ankle, sequela: Secondary | ICD-10-CM

## 2018-06-11 DIAGNOSIS — R262 Difficulty in walking, not elsewhere classified: Secondary | ICD-10-CM | POA: Diagnosis not present

## 2018-06-11 DIAGNOSIS — M25572 Pain in left ankle and joints of left foot: Secondary | ICD-10-CM

## 2018-06-11 NOTE — Therapy (Signed)
[place], [place], [address] Phone: [phone]   Fax:  [phone]  Wound Care Therapy  Patient Details  Name: Caitlin [name] MRN: [id number] Date of Birth: 25-Dec-1945 Referring Provider (PT): Sharilyn Sites    Encounter Date: 06/11/2018  PT End of Session - 06/11/18 1053    Visit Number  22    Number of Visits  28    Date for PT Re-Evaluation  05/28/18    Authorization Type  Medicare A & B    Authorization Time Period  9/30-->05/28/2018; 05/29/18-06/22/18    Authorization - Visit Number  1    Authorization - Number of Visits  10    PT Start Time  0900    PT Stop Time  0938    PT Time Calculation (min)  38 min    Activity Tolerance  Patient tolerated treatment well    Behavior During Therapy  [facility] for tasks assessed/performed       Past Medical History:  Diagnosis Date  . Arthritis   . Asthma   . Breast cancer ([place])   . Breast disorder    cancer  . Breast mass    benign  . Cellulitis   . GERD (gastroesophageal reflux disease)   . Heart murmur    AS CHILD  OUTGREW IT  . History of hiatal hernia   . Hyperlipidemia   . Hypertension   . Impaired fasting glucose   . Rectocele   . Varicose vein of leg     Past Surgical History:  Procedure Laterality Date  . BREAST LUMPECTOMY WITH RADIOACTIVE SEED AND SENTINEL LYMPH NODE BIOPSY Right 11/11/2015   Procedure: RIGHT BREAST RADIOACTIVE SEED GUIDED  LUMPECTOMY AND RADIOACTIVE SEED TARGETED SENTINEL LYMPH NODE BIOPSY;  Surgeon: Stark Klein, MD;  Location: [place];  Service: General;  Laterality: Right;  . BREAST SURGERY Left    breast biopies x4  . CHOLECYSTECTOMY  09/08/2010  . COLONOSCOPY    . COLONOSCOPY N/A 09/25/2013   Procedure: COLONOSCOPY;  Surgeon: Rogene [place], MD;  [facility];  Service: Endoscopy;  Laterality: N/A;  100-moved to 1200 Ann to notify pt  . HERNIA REPAIR  12/24/2010, 12-2014   TOTAL 4  . LAPAROSCOPIC  GASTRIC SLEEVE RESECTION  11-2013   at [place]  2007/2008  . TONSILLECTOMY  1968  . TOTAL KNEE ARTHROPLASTY Left 05/02/2017  . TOTAL KNEE ARTHROPLASTY Left 05/02/2017   Procedure: LEFT TOTAL KNEE ARTHROPLASTY;  Surgeon: [name], MD;  Location: [place];  Service: Orthopedics;  Laterality: Left;  . UPPER GASTROINTESTINAL ENDOSCOPY    . VAGINAL HYSTERECTOMY  1980s    There were no vitals filed for this visit.              Wound Therapy - 06/11/18 1049    Subjective  Patient reports she is doing well and that her wound culture came back positive for MRSA.     Patient and Family Stated Goals  wound to heal and pain to be gone     Date of Onset  04/05/18    Prior Treatments  medication and self care.     Pain Scale  0-10    Pain Score  0-No pain    Evaluation and Treatment Procedures Explained to Patient/Family  Yes    Evaluation and Treatment Procedures  agreed to    Wound Properties Date First Assessed: 04/19/18  Time First Assessed: 0955 Wound Type: Venous stasis ulcer Location: Ankle Location Orientation: Left;Medial Present on Admission: Yes   Dressing Type  Alginate;Impregnated gauze (bismuth);Compression wrap    Dressing Changed  Changed    Dressing Status  Old drainage;Clean;Dry    Dressing Change Frequency  PRN    Site / Wound Assessment  Red   was painful and yellow on visit 10    % Wound base Red or Granulating  90%   visit 10 was 15% granulated   % Wound base Yellow/Fibrinous Exudate  10%   visit 10 was 85 % slough   Peri-wound Assessment  Hemosiderin   did have edema as well as redness   Wound Length (cm)  0.2 cm   was 0.6cm last week   Wound Width (cm)  0.4 cm   was 0.5cm last week   Wound Depth (cm)  0.4 cm   was 0.7cm last week   Wound Volume (cm^3)  0.03 cm^3    Wound Surface Area (cm^2)  0.08 cm^2    Margins  Epibole (rolled edges)    Drainage Amount  Minimal    Drainage Description  Sanguineous     Treatment  Cleansed;Debridement (Selective)    Selective Debridement - Location  wound bed and margins    Selective Debridement - Tools Used  Forceps    Selective Debridement - Tissue Removed  epiboled edges     Wound Therapy - Clinical Statement  Wound remeasured today with reduced size and drainage noted. Cleansed well and debrided slough from wound bed.  continued with alginate and profore dressing. Redness and edema persists inferior to wound, however no signs of active infection.     Wound Therapy - Functional Problem List  Pain when walking     Factors Delaying/Impairing Wound Healing  Infection - systemic/local;Vascular compromise    Hydrotherapy Plan  Debridement;Dressing change;Patient/family education;Pulsatile lavage with suction    Wound Therapy - Frequency  3X / week    Wound Therapy - Current Recommendations  PT    Wound Plan  Cleanse and debride wound followed by application of compression.  Measure weekly.      Dressing   alginate packed into wound,  profore.                 PT Short Term Goals - 06/04/18 1603      PT SHORT TERM GOAL #1   Title  Pt pain to be no greater than a 4/10 to allow pt to be up for over an hour in comfort to complete household duties.     Time  3    Period  Weeks    Status  Achieved      PT SHORT TERM GOAL #2   Title  Pt wound to be 100% granulated to decrease infection     Time  3    Period  Weeks    Status  Achieved      PT SHORT TERM GOAL #3   Title  PT wound to to be approximated to 2x1.5 cm    Time  3    Period  Weeks    Status  On-going        PT Long Term Goals - 06/04/18 1604      PT LONG TERM GOAL #1   Title  PT to have no pain in her left ankle to be able to be up for over 2 hours at a time to shop  Time  6    Period  Weeks    Status  On-going      PT LONG TERM GOAL #2   Title  Wound to be healed    Time  6    Period  Weeks    Status  On-going      PT LONG TERM GOAL #3   Title  PT to be wearing  compression garment everyday.     Time  6    Period  Weeks    Status  On-going              Patient will benefit from skilled therapeutic intervention in order to improve the following deficits and impairments:     Visit Diagnosis: Pain in left ankle and joints of left foot  Difficulty in walking, not elsewhere classified  Ankle wound, left, sequela     Problem List Patient Active Problem List   Diagnosis Date Noted  . Unilateral primary osteoarthritis, left knee 05/02/2017  . S/P total knee replacement using cement, left 05/02/2017  . Primary osteoarthritis of left knee 12/21/2016  . Chondromalacia patellae, left knee 05/26/2016  . Breast cancer of upper-inner quadrant of right female breast ([place]) 11/04/2015  . GERD (gastroesophageal reflux disease) 09/02/2013  . Obesity 09/02/2013  . Ventral hernia, recurrent 09/02/2013  . Unspecified asthma(493.90) 12/12/2012  . Cough variant asthma 03/27/2012  . Right middle lobe syndrome 03/27/2012   Teena Irani, PTA/CLT [phone] Teena Irani 06/11/2018, 10:54 AM  [place], [place], [address] Phone: [phone]   Fax:  [phone]  Name: Caitlin [name] MRN: [id number] Date of Birth: 14-Mar-1946

## 2018-06-13 ENCOUNTER — Ambulatory Visit (HOSPITAL_COMMUNITY): Payer: Medicare Other

## 2018-06-15 ENCOUNTER — Ambulatory Visit (INDEPENDENT_AMBULATORY_CARE_PROVIDER_SITE_OTHER): Payer: Medicare Other | Admitting: Infectious Diseases

## 2018-06-15 ENCOUNTER — Encounter (HOSPITAL_COMMUNITY): Payer: Self-pay

## 2018-06-15 ENCOUNTER — Encounter: Payer: Self-pay | Admitting: Infectious Diseases

## 2018-06-15 ENCOUNTER — Ambulatory Visit (HOSPITAL_COMMUNITY): Payer: Medicare Other

## 2018-06-15 DIAGNOSIS — M25572 Pain in left ankle and joints of left foot: Secondary | ICD-10-CM

## 2018-06-15 DIAGNOSIS — R262 Difficulty in walking, not elsewhere classified: Secondary | ICD-10-CM

## 2018-06-15 DIAGNOSIS — S91002S Unspecified open wound, left ankle, sequela: Secondary | ICD-10-CM | POA: Diagnosis not present

## 2018-06-15 DIAGNOSIS — L03116 Cellulitis of left lower limb: Secondary | ICD-10-CM | POA: Diagnosis not present

## 2018-06-15 MED ORDER — ORITAVANCIN DIPHOSPHATE 400 MG IV SOLR: INTRAVENOUS | 0 refills | Status: DC

## 2018-06-15 NOTE — Assessment & Plan Note (Addendum)
I am worried about the persistent nature of her wound. Proximity to her ankle, and TKR.  I gave them several options 1) continue po (I do not recommend this) 2) oritavancin (IV x1, will stay in her system for 2 weeks) 3) IV vancomycin for several weeks.   Will repeat her MRI.  We have agreed to give her oritavancin til we have more information from the pharmacy.  Will see her back in 2 weeks.

## 2018-06-15 NOTE — Therapy (Signed)
[place], [place], [address] Phone: [phone]   Fax:  [phone]  Wound Care Therapy  Patient Details  Name: Caitlin [name] MRN: [id number] Date of Birth: Dec 28, 1945 Referring Provider (PT): Sharilyn Sites    Encounter Date: 06/15/2018  PT End of Session - 06/15/18 1552    Visit Number  23    Number of Visits  28    Date for PT Re-Evaluation  06/22/18    Authorization Type  Medicare A & B    Authorization Time Period  9/30-->05/28/2018; 05/29/18-06/22/18    Authorization - Visit Number  3    Authorization - Number of Visits  10    PT Start Time  0950    PT Stop Time  1028    PT Time Calculation (min)  38 min    Activity Tolerance  Patient tolerated treatment well    Behavior During Therapy  [facility] for tasks assessed/performed       Past Medical History:  Diagnosis Date  . Arthritis   . Asthma   . Breast cancer ([place])   . Breast disorder    cancer  . Breast mass    benign  . Cellulitis   . GERD (gastroesophageal reflux disease)   . Heart murmur    AS CHILD  OUTGREW IT  . History of hiatal hernia   . Hyperlipidemia   . Hypertension   . Impaired fasting glucose   . Rectocele   . Varicose vein of leg     Past Surgical History:  Procedure Laterality Date  . BREAST LUMPECTOMY WITH RADIOACTIVE SEED AND SENTINEL LYMPH NODE BIOPSY Right 11/11/2015   Procedure: RIGHT BREAST RADIOACTIVE SEED GUIDED  LUMPECTOMY AND RADIOACTIVE SEED TARGETED SENTINEL LYMPH NODE BIOPSY;  Surgeon: Stark Klein, MD;  Location: [place];  Service: General;  Laterality: Right;  . BREAST SURGERY Left    breast biopies x4  . CHOLECYSTECTOMY  09/08/2010  . COLONOSCOPY    . COLONOSCOPY N/A 09/25/2013   Procedure: COLONOSCOPY;  Surgeon: Rogene [place], MD;  [facility];  Service: Endoscopy;  Laterality: N/A;  100-moved to 1200 Ann to notify pt  . HERNIA REPAIR  12/24/2010, 12-2014   TOTAL 4  . LAPAROSCOPIC  GASTRIC SLEEVE RESECTION  11-2013   at [place]  2007/2008  . TONSILLECTOMY  1968  . TOTAL KNEE ARTHROPLASTY Left 05/02/2017  . TOTAL KNEE ARTHROPLASTY Left 05/02/2017   Procedure: LEFT TOTAL KNEE ARTHROPLASTY;  Surgeon: [name], MD;  Location: [place];  Service: Orthopedics;  Laterality: Left;  . UPPER GASTROINTESTINAL ENDOSCOPY    . VAGINAL HYSTERECTOMY  1980s    There were no vitals filed for this visit.   Subjective Assessment - 06/15/18 1023    Subjective  Pt stated she has apt with infection control specialist later today, arrived with dressings intact.  No reports of pain today.    Currently in Pain?  No/denies                Wound Therapy - 06/15/18 1553    Subjective  Pt stated she has apt with infection control specialist later today, arrived with dressings intact.  No reports of pain today.    Patient and Family Stated Goals  wound to heal and pain to be gone     Date of Onset  04/05/18    Prior Treatments  medication and self care.  Evaluation and Treatment Procedures Explained to Patient/Family  Yes    Evaluation and Treatment Procedures  agreed to    Wound Properties Date First Assessed: 04/19/18 Time First Assessed: 0955 Wound Type: Venous stasis ulcer Location: Ankle Location Orientation: Left;Medial Present on Admission: Yes   Dressing Type  Alginate;Impregnated gauze (bismuth);Compression wrap   alginate packed wiht xeroform and profore   Dressing Status  Old drainage;Clean;Dry    Dressing Change Frequency  PRN    Site / Wound Assessment  Red    % Wound base Red or Granulating  95%    % Wound base Yellow/Fibrinous Exudate  5%    Peri-wound Assessment  Edema;Hemosiderin    Wound Length (cm)  0.2 cm   was .2   Wound Width (cm)  0.2 cm   was .4   Wound Depth (cm)  0.4 cm   was unknown   Wound Volume (cm^3)  0.02 cm^3    Wound Surface Area (cm^2)  0.04 cm^2    Margins  Epibole (rolled edges)    Drainage  Amount  Minimal    Drainage Description  Sanguineous    Treatment  Cleansed;Debridement (Selective)    Selective Debridement - Location  wound bed and margins    Selective Debridement - Tools Used  Forceps    Selective Debridement - Tissue Removed  epiboled edges     Wound Therapy - Clinical Statement  Wound progressing well with decreased drainage and overall size.  Good skin integrity.  Continued with alginate packed into wound wiht xeroform on skin and profore for edema control.  Pt has plans wiht infection control specialist later today.    Wound Therapy - Functional Problem List  Pain when walking     Factors Delaying/Impairing Wound Healing  Infection - systemic/local;Vascular compromise    Hydrotherapy Plan  Debridement;Dressing change;Patient/family education;Pulsatile lavage with suction    Wound Therapy - Frequency  2X / week   3x/week for first week f/b 2x/week x 5 weeks   Wound Therapy - Current Recommendations  PT    Wound Plan  Cleanse and debride wound followed by application of compression.  Measure weekly.      Dressing   alginate packed into wound,  profore.                 PT Short Term Goals - 06/04/18 1603      PT SHORT TERM GOAL #1   Title  Pt pain to be no greater than a 4/10 to allow pt to be up for over an hour in comfort to complete household duties.     Time  3    Period  Weeks    Status  Achieved      PT SHORT TERM GOAL #2   Title  Pt wound to be 100% granulated to decrease infection     Time  3    Period  Weeks    Status  Achieved      PT SHORT TERM GOAL #3   Title  PT wound to to be approximated to 2x1.5 cm    Time  3    Period  Weeks    Status  On-going        PT Long Term Goals - 06/04/18 1604      PT LONG TERM GOAL #1   Title  PT to have no pain in her left ankle to be able to be up for over 2 hours at a time to shop  Time  6    Period  Weeks    Status  On-going      PT LONG TERM GOAL #2   Title  Wound to be healed     Time  6    Period  Weeks    Status  On-going      PT LONG TERM GOAL #3   Title  PT to be wearing compression garment everyday.     Time  6    Period  Weeks    Status  On-going              Patient will benefit from skilled therapeutic intervention in order to improve the following deficits and impairments:     Visit Diagnosis: Pain in left ankle and joints of left foot  Difficulty in walking, not elsewhere classified  Ankle wound, left, sequela     Problem List Patient Active Problem List   Diagnosis Date Noted  . Cellulitis of left ankle 06/15/2018  . Unilateral primary osteoarthritis, left knee 05/02/2017  . S/P total knee replacement using cement, left 05/02/2017  . Primary osteoarthritis of left knee 12/21/2016  . Chondromalacia patellae, left knee 05/26/2016  . Breast cancer of upper-inner quadrant of right female breast ([place]) 11/04/2015  . GERD (gastroesophageal reflux disease) 09/02/2013  . Obesity 09/02/2013  . Ventral hernia, recurrent 09/02/2013  . Unspecified asthma(493.90) 12/12/2012  . Cough variant asthma 03/27/2012  . Right middle lobe syndrome 03/27/2012   Ihor Austin, LPTA; [place]  Aldona Lento 06/15/2018, 3:56 PM  [facility] [address] [place], [place], [address] Phone: [phone]   Fax:  [phone]  Name: Caitlin [name] MRN: [id number] Date of Birth: 1945/08/26

## 2018-06-15 NOTE — Progress Notes (Signed)
   Subjective:    Patient ID: Caitlin [name], female    DOB: 10/23/1945, 72 y.o.   MRN: [id number]  HPI 72 yo F with hx of vericose veins, prev ablation. L TKR (Dr Durward Fortes), L breast CA (treated with XRT 2017).  She had vericose bleeds this spring/summer which improved with local care.  After her last bleed of her LLE she then developed swelling and pain in her ankle. She was seen in ED and was given NSAID. She returned with worsening pain ~48 h later and was told she had cellulitis. She had MRI done 10-7 that showed:  Soft tissue ulcer and cellulitis along the medial aspect of the hindfoot. No marrow signal abnormality indicative of acute osteomyelitis. She was given bactrim but developed hives. She was then changed to doxycycline.  1 week later she developed oozing from a boil on her ankle. She was seen by PCP and was started on augmentin and was seen in [place].  She had wound Cx 06-01-18 MRSA (s- doxy, bactrim, clinda).   Has been off anbx for 1 week.  She has recently restarted her mupirocin intranasal.  The past medical history, family history and social history were reviewed/updated in EPIC   Review of Systems  Constitutional: Negative for appetite change, chills, fever and unexpected weight change.  Respiratory: Negative for cough.   Gastrointestinal: Negative for constipation and diarrhea.  Genitourinary: Negative for difficulty urinating.  Skin: Positive for wound.  Psychiatric/Behavioral: Negative for sleep disturbance.  Please see HPI. All other systems reviewed and negative.      Objective:   Physical Exam  Constitutional: She is oriented to person, place, and time. She appears well-developed and well-nourished.  HENT:  Mouth/Throat: No oropharyngeal exudate.  Eyes: Pupils are equal, round, and reactive to light. EOM are normal.  Neck: Normal range of motion. Neck supple.  Cardiovascular: Normal rate, regular rhythm and normal heart sounds.  Pulmonary/Chest: Effort  normal and breath sounds normal.  Abdominal: Bowel sounds are normal. She exhibits no distension. There is no tenderness.  Musculoskeletal:       Feet:  Lymphadenopathy:    She has no cervical adenopathy.  Neurological: She is alert and oriented to person, place, and time.  Psychiatric: She has a normal mood and affect.          Assessment & Plan:

## 2018-06-15 NOTE — Therapy (Signed)
[facility], [place], [address] Phone: [phone]   Fax:  [phone]  Physical Therapy Treatment  Patient Details  Name: Caitlin [name] MRN: [id number] Date of Birth: [birth year] Referring Provider (PT): Sharilyn Sites    Encounter Date: 06/15/2018  PT End of Session - 06/15/18 1603    Visit Number  24    Number of Visits  28    Date for PT Re-Evaluation  06/22/18    Authorization Type  Medicare A & B    Authorization Time Period  9/30-->05/28/2018; 05/29/18-06/22/18    Authorization - Visit Number  4    Authorization - Number of Visits  10    PT Start Time  1520    PT Stop Time  1540    PT Time Calculation (min)  20 min    Activity Tolerance  Patient tolerated treatment well    Behavior During Therapy  [facility] for tasks assessed/performed       Past Medical History:  Diagnosis Date  . Arthritis   . Asthma   . Breast cancer ([place])   . Breast disorder    cancer  . Breast mass    benign  . Cellulitis   . GERD (gastroesophageal reflux disease)   . Heart murmur    AS CHILD  OUTGREW IT  . History of hiatal hernia   . Hyperlipidemia   . Hypertension   . Impaired fasting glucose   . Rectocele   . Varicose vein of leg     Past Surgical History:  Procedure Laterality Date  . BREAST LUMPECTOMY WITH RADIOACTIVE SEED AND SENTINEL LYMPH NODE BIOPSY Right 11/11/2015   Procedure: RIGHT BREAST RADIOACTIVE SEED GUIDED  LUMPECTOMY AND RADIOACTIVE SEED TARGETED SENTINEL LYMPH NODE BIOPSY;  Surgeon: Stark Klein, MD;  Location: [place];  Service: General;  Laterality: Right;  . BREAST SURGERY Left    breast biopies x4  . CHOLECYSTECTOMY  09/08/2010  . COLONOSCOPY    . COLONOSCOPY N/A 09/25/2013   Procedure: COLONOSCOPY;  Surgeon: Rogene [place], MD;  [facility];  Service: Endoscopy;  Laterality: N/A;  100-moved to 1200 Ann to notify pt  . HERNIA REPAIR  12/24/2010, 12-2014   TOTAL 4  .  LAPAROSCOPIC GASTRIC SLEEVE RESECTION  11-2013   at [facility]  2007/2008  . TONSILLECTOMY  1968  . TOTAL KNEE ARTHROPLASTY Left 05/02/2017  . TOTAL KNEE ARTHROPLASTY Left 05/02/2017   Procedure: LEFT TOTAL KNEE ARTHROPLASTY;  Surgeon: [name], MD;  Location: [facility];  Service: Orthopedics;  Laterality: Left;  . UPPER GASTROINTESTINAL ENDOSCOPY    . VAGINAL HYSTERECTOMY  1980s    There were no vitals filed for this visit.  Subjective Assessment - 06/15/18 1557    Subjective  Pt arrived following infection control specialist earlier today with reports of discomfort due to dressings sliding down.  Reports wound specialist order another MRI and to receive 1 antibiotic shot next week.  Returns to MD on 07/18/18.    Currently in Pain?  No/denies               Treatment: wrapping Profore following MD apt        PT Short Term Goals - 06/04/18 1603      PT SHORT TERM GOAL #1   Title  Pt pain to be no greater than a 4/10 to allow pt to be up for over an  hour in comfort to complete household duties.     Time  3    Period  Weeks    Status  Achieved      PT SHORT TERM GOAL #2   Title  Pt wound to be 100% granulated to decrease infection     Time  3    Period  Weeks    Status  Achieved      PT SHORT TERM GOAL #3   Title  PT wound to to be approximated to 2x1.5 cm    Time  3    Period  Weeks    Status  On-going        PT Long Term Goals - 06/04/18 1604      PT LONG TERM GOAL #1   Title  PT to have no pain in her left ankle to be able to be up for over 2 hours at a time to shop     Time  6    Period  Weeks    Status  On-going      PT LONG TERM GOAL #2   Title  Wound to be healed    Time  6    Period  Weeks    Status  On-going      PT LONG TERM GOAL #3   Title  PT to be wearing compression garment everyday.     Time  6    Period  Weeks    Status  On-going            Plan - 06/15/18 1558    Clinical  Impression Statement  Pt arrived following visit to infection specialist with reports of dressings sliding down and discomfort from.  Removed previous dressings and rewrapped profore wrap.  No selective debridement complete, just dressing change.       Patient will benefit from skilled therapeutic intervention in order to improve the following deficits and impairments:     Visit Diagnosis: Pain in left ankle and joints of left foot  Difficulty in walking, not elsewhere classified  Ankle wound, left, sequela     Problem List Patient Active Problem List   Diagnosis Date Noted  . Cellulitis of left ankle 06/15/2018  . Unilateral primary osteoarthritis, left knee 05/02/2017  . S/P total knee replacement using cement, left 05/02/2017  . Primary osteoarthritis of left knee 12/21/2016  . Chondromalacia patellae, left knee 05/26/2016  . Breast cancer of upper-inner quadrant of right female breast ([place]) 11/04/2015  . GERD (gastroesophageal reflux disease) 09/02/2013  . Obesity 09/02/2013  . Ventral hernia, recurrent 09/02/2013  . Unspecified asthma(493.90) 12/12/2012  . Cough variant asthma 03/27/2012  . Right middle lobe syndrome 03/27/2012   Ihor Austin, LPTA; [place]  Aldona Lento 06/15/2018, 4:06 PM  [facility] [place], [place], [address] Phone: [phone]   Fax:  [phone]  Name: IVALENE PLATTE MRN: [id number] Date of Birth: Nov 04, 1945

## 2018-06-18 ENCOUNTER — Encounter (HOSPITAL_COMMUNITY): Payer: Self-pay

## 2018-06-18 ENCOUNTER — Encounter (HOSPITAL_COMMUNITY)
Admission: RE | Admit: 2018-06-18 | Discharge: 2018-06-18 | Disposition: A | Discharge status: Home / Self Care | Payer: Medicare Other | Source: Ambulatory Visit | Attending: Infectious Diseases | Admitting: Infectious Diseases

## 2018-06-18 ENCOUNTER — Ambulatory Visit (HOSPITAL_COMMUNITY)
Admission: RE | Admit: 2018-06-18 | Discharge: 2018-06-18 | Disposition: A | Discharge status: Home / Self Care | Payer: Medicare Other | Source: Ambulatory Visit | Attending: Infectious Diseases | Admitting: Infectious Diseases

## 2018-06-18 ENCOUNTER — Ambulatory Visit (HOSPITAL_COMMUNITY): Payer: Medicare Other

## 2018-06-18 DIAGNOSIS — L03116 Cellulitis of left lower limb: Secondary | ICD-10-CM | POA: Insufficient documentation

## 2018-06-18 LAB — POCT I-STAT CREATININE: CREATININE: 0.5 mg/dL (ref 0.44–1.00)

## 2018-06-18 MED ORDER — SODIUM CHLORIDE 0.9 % IV SOLN
Freq: Once | INTRAVENOUS | Status: AC
  Administered 2018-06-18: 09:00:00 via INTRAVENOUS

## 2018-06-18 MED ORDER — ORITAVANCIN DIPHOSPHATE 400 MG IV SOLR
1200.0000 mg | Freq: Once | INTRAVENOUS | Status: AC
  Administered 2018-06-18: 1200 mg via INTRAVENOUS
  Filled 2018-06-18: qty 120

## 2018-06-18 MED ORDER — GADOBUTROL 1 MMOL/ML IV SOLN
10.0000 mL | Freq: Once | INTRAVENOUS | Status: AC | PRN
  Administered 2018-06-18: 10 mL via INTRAVENOUS

## 2018-06-19 ENCOUNTER — Telehealth: Payer: Self-pay | Admitting: Infectious Diseases

## 2018-06-19 NOTE — Telephone Encounter (Signed)
Called pt and left VM that her  MRI did not show bone infection.  Discussed that hopefully her long acting anbx injection will cover the cellulitis that was present.  Can discuss further if she calls back, or at f/u appt.

## 2018-06-20 ENCOUNTER — Ambulatory Visit (HOSPITAL_COMMUNITY): Payer: Medicare Other

## 2018-06-20 ENCOUNTER — Other Ambulatory Visit: Payer: Self-pay

## 2018-06-20 ENCOUNTER — Encounter (HOSPITAL_COMMUNITY): Payer: Self-pay

## 2018-06-20 DIAGNOSIS — S91002S Unspecified open wound, left ankle, sequela: Secondary | ICD-10-CM | POA: Diagnosis not present

## 2018-06-20 DIAGNOSIS — R262 Difficulty in walking, not elsewhere classified: Secondary | ICD-10-CM | POA: Diagnosis not present

## 2018-06-20 DIAGNOSIS — M25572 Pain in left ankle and joints of left foot: Secondary | ICD-10-CM

## 2018-06-20 NOTE — Therapy (Signed)
[place], [place], [address] Phone: [phone]   Fax:  [phone]  Wound Care Therapy  Patient Details  Name: Caitlin [name] MRN: [id number] Date of Birth: 12-17-1945 Referring Provider (PT): Sharilyn Sites    Encounter Date: 06/20/2018  PT End of Session - 06/20/18 1100    Visit Number  25    Number of Visits  28    Date for PT Re-Evaluation  06/22/18    Authorization Type  Medicare A & B    Authorization Time Period  9/30-->05/28/2018; 05/29/18-06/22/18    Authorization - Visit Number  5    Authorization - Number of Visits  10    PT Start Time  0903    PT Stop Time  0924    PT Time Calculation (min)  21 min    Activity Tolerance  Patient tolerated treatment well    Behavior During Therapy  [facility] for tasks assessed/performed       Past Medical History:  Diagnosis Date  . Arthritis   . Asthma   . Breast cancer ([place])   . Breast disorder    cancer  . Breast mass    benign  . Cellulitis   . GERD (gastroesophageal reflux disease)   . Heart murmur    AS CHILD  OUTGREW IT  . History of hiatal hernia   . Hyperlipidemia   . Hypertension   . Impaired fasting glucose   . Rectocele   . Varicose vein of leg     Past Surgical History:  Procedure Laterality Date  . BREAST LUMPECTOMY WITH RADIOACTIVE SEED AND SENTINEL LYMPH NODE BIOPSY Right 11/11/2015   Procedure: RIGHT BREAST RADIOACTIVE SEED GUIDED  LUMPECTOMY AND RADIOACTIVE SEED TARGETED SENTINEL LYMPH NODE BIOPSY;  Surgeon: Stark Klein, MD;  Location: [place];  Service: General;  Laterality: Right;  . BREAST SURGERY Left    breast biopies x4  . CHOLECYSTECTOMY  09/08/2010  . COLONOSCOPY    . COLONOSCOPY N/A 09/25/2013   Procedure: COLONOSCOPY;  Surgeon: Rogene Houston, MD;  [facility];  Service: Endoscopy;  Laterality: N/A;  100-moved to 1200 Ann to notify pt  . HERNIA REPAIR  12/24/2010, 12-2014   TOTAL 4  . LAPAROSCOPIC  GASTRIC SLEEVE RESECTION  11-2013   at [place]  2007/2008  . TONSILLECTOMY  1968  . TOTAL KNEE ARTHROPLASTY Left 05/02/2017  . TOTAL KNEE ARTHROPLASTY Left 05/02/2017   Procedure: LEFT TOTAL KNEE ARTHROPLASTY;  Surgeon: [name], MD;  Location: [place];  Service: Orthopedics;  Laterality: Left;  . UPPER GASTROINTESTINAL ENDOSCOPY    . VAGINAL HYSTERECTOMY  1980s    There were no vitals filed for this visit.    Wound Therapy - 06/20/18 0927    Subjective  Patient reports her appointment at infection prevention went well and they did not find any signs of infeciton in her bone. She states she recieved an IV antibiotic injection Monday and that they told the antibiotics should work in her system for about 3-4 weeks.    Patient and Family Stated Goals  wound to heal and pain to be gone     Date of Onset  04/05/18    Prior Treatments  medication and self care.     Pain Scale  0-10    Pain Score  0-No pain    Evaluation and Treatment Procedures Explained to Patient/Family  Yes  Evaluation and Treatment Procedures  agreed to    Wound Properties Date First Assessed: 04/19/18 Time First Assessed: 0955 Wound Type: Venous stasis ulcer Location: Ankle Location Orientation: Left;Medial Present on Admission: Yes   Dressing Type  Alginate;Impregnated gauze (bismuth);Compression wrap   alginate packed wiht xeroform and profore   Dressing Changed  Changed    Dressing Status  Old drainage;Clean;Dry    Dressing Change Frequency  PRN    Site / Wound Assessment  Red    % Wound base Red or Granulating  95%    % Wound base Yellow/Fibrinous Exudate  5%    Peri-wound Assessment  Edema;Hemosiderin    Wound Length (cm)  0.1 cm    Wound Width (cm)  0.2 cm    Wound Depth (cm)  --   unknown   Wound Surface Area (cm^2)  0.02 cm^2    Margins  Epibole (rolled edges)    Drainage Amount  Minimal    Drainage Description  Serous    Treatment  Cleansed;Debridement  (Selective)    Selective Debridement - Location  wound margins    Selective Debridement - Tools Used  Forceps    Selective Debridement - Tissue Removed  epiboled edges     Wound Therapy - Clinical Statement  Wound presents as well healing with resurfacing and good skin integrity. Patient's wound is now a small opening with an unknown depth and minimal serous draining. No packing to wound this date, applied petroleum to periwound and small alginate piece was placed over remaining wound opening for drainage. Applied gauze and medipore dressing and instructed patient to begin wearing compression stockings daily again and to put them on when she returns home after this session. Anticipate she will be ready for discharge on Friday.    Wound Therapy - Functional Problem List  Pain when walking     Factors Delaying/Impairing Wound Healing  Infection - systemic/local;Vascular compromise    Hydrotherapy Plan  Debridement;Dressing change;Patient/family education;Pulsatile lavage with suction    Wound Therapy - Frequency  2X / week   3x/week for first week f/b 2x/week x 5 weeks   Wound Therapy - Current Recommendations  PT    Wound Plan  Review proper skin care with patient: cleansing with gentle soap and water, regular moisturizing, wearing compression garments daily. Review proper care for garments with washing and air drying, replacing garments every 6 months.     Dressing   alginate on top of wound, petrolleum around periwound, gauze and tape dressing        PT Education - 06/20/18 1058    Education Details  Educated to keep dressing dry or if she would like to shower edcuated to remove dressing and replace with clean/dry gauze and tape.     Person(s) Educated  Patient    Methods  Explanation    Comprehension  Verbalized understanding       PT Short Term Goals - 06/04/18 1603      PT SHORT TERM GOAL #1   Title  Pt pain to be no greater than a 4/10 to allow pt to be up for over an hour in comfort  to complete household duties.     Time  3    Period  Weeks    Status  Achieved      PT SHORT TERM GOAL #2   Title  Pt wound to be 100% granulated to decrease infection     Time  3    Period  Weeks  Status  Achieved      PT SHORT TERM GOAL #3   Title  PT wound to to be approximated to 2x1.5 cm    Time  3    Period  Weeks    Status  On-going        PT Long Term Goals - 06/04/18 1604      PT LONG TERM GOAL #1   Title  PT to have no pain in her left ankle to be able to be up for over 2 hours at a time to shop     Time  6    Period  Weeks    Status  On-going      PT LONG TERM GOAL #2   Title  Wound to be healed    Time  6    Period  Weeks    Status  On-going      PT LONG TERM GOAL #3   Title  PT to be wearing compression garment everyday.     Time  6    Period  Weeks    Status  On-going         Plan - 06/20/18 1100    Clinical Impression Statement  see above    Rehab Potential  Good    PT Frequency  2x / week    PT Duration  4 weeks    PT Treatment/Interventions  Other (comment);Compression bandaging   debridement, compression bandaging, PLS   PT Next Visit Plan  see above    Consulted and Agree with Plan of Care  Patient       Patient will benefit from skilled therapeutic intervention in order to improve the following deficits and impairments:  Increased edema, Difficulty walking, Decreased skin integrity, Pain  Visit Diagnosis: Pain in left ankle and joints of left foot  Difficulty in walking, not elsewhere classified  Ankle wound, left, sequela     Problem List Patient Active Problem List   Diagnosis Date Noted  . Cellulitis of left ankle 06/15/2018  . Unilateral primary osteoarthritis, left knee 05/02/2017  . S/P total knee replacement using cement, left 05/02/2017  . Primary osteoarthritis of left knee 12/21/2016  . Chondromalacia patellae, left knee 05/26/2016  . Breast cancer of upper-inner quadrant of right female breast ([place])  11/04/2015  . GERD (gastroesophageal reflux disease) 09/02/2013  . Obesity 09/02/2013  . Ventral hernia, recurrent 09/02/2013  . Unspecified asthma(493.90) 12/12/2012  . Cough variant asthma 03/27/2012  . Right middle lobe syndrome 03/27/2012    Kipp Brood, PT, DPT Physical Therapist with [facility]  06/20/2018 11:01 AM    [place] [address] [place], [place], [address] Phone: [phone]   Fax:  [phone]  Name: Caitlin [name] MRN: [id number] Date of Birth: 1946-05-05

## 2018-06-22 ENCOUNTER — Encounter (HOSPITAL_COMMUNITY): Payer: Self-pay

## 2018-06-22 ENCOUNTER — Ambulatory Visit (HOSPITAL_COMMUNITY): Payer: Medicare Other

## 2018-06-22 DIAGNOSIS — S91002S Unspecified open wound, left ankle, sequela: Secondary | ICD-10-CM

## 2018-06-22 DIAGNOSIS — M25572 Pain in left ankle and joints of left foot: Secondary | ICD-10-CM

## 2018-06-22 DIAGNOSIS — R262 Difficulty in walking, not elsewhere classified: Secondary | ICD-10-CM

## 2018-06-22 NOTE — Therapy (Addendum)
[place], [place], [address] Phone: [phone]   Fax:  [phone]  Wound Care Therapy Progress Note Reporting Period 05/29/18 to 06/22/18  See note below for Objective Data and Assessment of Progress/Goals.   Patient Details  Name: Caitlin [name] MRN: [id number] Date of Birth: 1946/01/08 Referring Provider (PT): Sharilyn Sites    Encounter Date: 06/22/2018  PHYSICAL THERAPY DISCHARGE SUMMARY  Visits from Start of Care:26  Current functional level related to goals / functional outcomes: Wound is almost closed; no longer needing skilled care.  Pt is wearing compression garment   Remaining deficits: none   Education / Equipment: Self care;  Plan: Patient agrees to discharge.  Patient goals were met. Patient is being discharged due to meeting the stated rehab goals.  ?????      PT End of Session - 06/22/18 0949    Visit Number  26    Number of Visits  26   Date for PT Re-Evaluation  06/22/18    Authorization Type  Medicare A & B    Authorization Time Period  9/30-->05/28/2018; 05/29/18-06/22/18    Authorization - Visit Number  6    Authorization - Number of Visits  10    PT Start Time  0908    PT Stop Time  0930    PT Time Calculation (min)  22 min    Activity Tolerance  Patient tolerated treatment well    Behavior During Therapy  WFL for tasks assessed/performed       Past Medical History:  Diagnosis Date  . Arthritis   . Asthma   . Breast cancer ([place])   . Breast disorder    cancer  . Breast mass    benign  . Cellulitis   . GERD (gastroesophageal reflux disease)   . Heart murmur    AS CHILD  OUTGREW IT  . History of hiatal hernia   . Hyperlipidemia   . Hypertension   . Impaired fasting glucose   . Rectocele   . Varicose vein of leg     Past Surgical History:  Procedure Laterality Date  . BREAST LUMPECTOMY WITH RADIOACTIVE SEED AND SENTINEL LYMPH NODE BIOPSY Right 11/11/2015    Procedure: RIGHT BREAST RADIOACTIVE SEED GUIDED  LUMPECTOMY AND RADIOACTIVE SEED TARGETED SENTINEL LYMPH NODE BIOPSY;  Surgeon: Stark Klein, MD;  Location: [place];  Service: General;  Laterality: Right;  . BREAST SURGERY Left    breast biopies x4  . CHOLECYSTECTOMY  09/08/2010  . COLONOSCOPY    . COLONOSCOPY N/A 09/25/2013   Procedure: COLONOSCOPY;  Surgeon: Rogene Houston, MD;  [facility];  Service: Endoscopy;  Laterality: N/A;  100-moved to 1200 Ann to notify pt  . HERNIA REPAIR  12/24/2010, 12-2014   TOTAL 4  . LAPAROSCOPIC GASTRIC SLEEVE RESECTION  11-2013   at [place]  2007/2008  . TONSILLECTOMY  1968  . TOTAL KNEE ARTHROPLASTY Left 05/02/2017  . TOTAL KNEE ARTHROPLASTY Left 05/02/2017   Procedure: LEFT TOTAL KNEE ARTHROPLASTY;  Surgeon: [name], MD;  Location: [place];  Service: Orthopedics;  Laterality: Left;  . UPPER GASTROINTESTINAL ENDOSCOPY    . VAGINAL HYSTERECTOMY  1980s    There were no vitals filed for this visit.   Subjective Assessment - 06/22/18 0943    Subjective  Pt arrived with compression garment for Lt LE, no pain today    Currently in Pain?  No/denies        Wound Therapy - 06/22/18 0943    Subjective  Pt arrived with compression garment for Lt LE, no pain today    Patient and Family Stated Goals  wound to heal and pain to be gone     Date of Onset  04/05/18    Prior Treatments  medication and self care.     Pain Scale  0-10    Pain Score  0-No pain    Evaluation and Treatment Procedures Explained to Patient/Family  Yes    Evaluation and Treatment Procedures  agreed to    Wound Properties Date First Assessed: 04/19/18 Time First Assessed: 0955 Wound Type: Venous stasis ulcer Location: Ankle Location Orientation: Left;Medial Present on Admission: Yes   Dressing Type  Alginate   alginate, medipore tape with compression garmerts   Dressing Changed  Changed    Dressing Status  Old  drainage;Clean;Dry    Dressing Change Frequency  PRN    Site / Wound Assessment  Granulation tissue    % Wound base Red or Granulating  100%    % Wound base Yellow/Fibrinous Exudate  0%    % Wound base Black/Eschar  0%    Wound Length (cm)  0.1 cm    Wound Width (cm)  0.1 cm    Wound Depth (cm)  --   unknown   Wound Surface Area (cm^2)  0.01 cm^2    Drainage Amount  Scant    Drainage Description  Serous    Treatment  Cleansed    Wound Therapy - Clinical Statement  Upon removal of dressing, small opening with scant drainage.  LE cleansed though no selective debridement required this session.  DIscussed proper care with pt and husband, able to verbalize understanding for care at home.      Wound Therapy - Functional Problem List  Pain when walking     Factors Delaying/Impairing Wound Healing  Infection - systemic/local;Vascular compromise    Hydrotherapy Plan  Debridement;Dressing change;Patient/family education;Pulsatile lavage with suction    Wound Therapy - Frequency  2X / week    Wound Therapy - Current Recommendations  PT    Wound Plan  DC to self care per no selective debridement required    Dressing   alginate with 2x2 and medipore tape. Compression garment                PT Short Term Goals - 06/04/18 1603      PT SHORT TERM GOAL #1   Title  Pt pain to be no greater than a 4/10 to allow pt to be up for over an hour in comfort to complete household duties.     Time  3    Period  Weeks    Status  Achieved      PT SHORT TERM GOAL #2   Title  Pt wound to be 100% granulated to decrease infection     Time  3    Period  Weeks    Status  Achieved      PT SHORT TERM GOAL #3   Title  PT wound to to be approximated to 2x1.5 cm    Time  3    Period  Weeks    Status  achieved        PT Long Term Goals - 06/04/18 1604      PT LONG TERM GOAL #1   Title  PT to have no pain in her left ankle to  be able to be up for over 2 hours at a time to shop     Time  6     Period  Weeks    Status Achieved      PT LONG TERM GOAL #2   Title  Wound to be healed    Time  6    Period  Weeks    Status  partially met      PT LONG TERM GOAL #3   Title  PT to be wearing compression garment everyday.     Time  6    Period  Weeks    Status  achieved              Patient will benefit from skilled therapeutic intervention in order to improve the following deficits and impairments:     Visit Diagnosis: Pain in left ankle and joints of left foot  Difficulty in walking, not elsewhere classified  Ankle wound, left, sequela     Problem List Patient Active Problem List   Diagnosis Date Noted  . Cellulitis of left ankle 06/15/2018  . Unilateral primary osteoarthritis, left knee 05/02/2017  . S/P total knee replacement using cement, left 05/02/2017  . Primary osteoarthritis of left knee 12/21/2016  . Chondromalacia patellae, left knee 05/26/2016  . Breast cancer of upper-inner quadrant of right female breast ([place]) 11/04/2015  . GERD (gastroesophageal reflux disease) 09/02/2013  . Obesity 09/02/2013  . Ventral hernia, recurrent 09/02/2013  . Unspecified asthma(493.90) 12/12/2012  . Cough variant asthma 03/27/2012  . Right middle lobe syndrome 03/27/2012   Ihor Austin, [place]; [place]  Rayetta Humphrey, PT CLT [phone] 06/22/2018, 9:52 AM  [place], [place], [address] Phone: [phone]   Fax:  [phone]  Name: Caitlin [name] MRN: [id number] Date of Birth: [birth year]

## 2018-06-26 ENCOUNTER — Ambulatory Visit (HOSPITAL_COMMUNITY): Payer: Medicare Other

## 2018-07-03 ENCOUNTER — Telehealth: Payer: Self-pay | Admitting: *Deleted

## 2018-07-03 NOTE — Telephone Encounter (Signed)
Patient called with questions.  She was released from wound clinic on 11/29, is still using alginate on her wound. She washes the wound with dial soap in the shower.  She had an infusion of oritivancin and an MRI on 11/18. She is not on oral antibiotics now, but is worried about her wound as she has noted the return of swelling and some pain (although this gets somewhat better with rest and elevation).  She wants to know if she needs antibiotics/visit now or if it is ok to wait until her follow up appointment 12/19. Please advise. Landis Gandy, RN

## 2018-07-03 NOTE — Telephone Encounter (Signed)
Ok to wait til f/u unless she is worsened (erythema, fever or chills) thanks

## 2018-07-04 NOTE — Telephone Encounter (Signed)
Patient denies fever/chills/redness, just discomfort. She will follow up with any other changes.

## 2018-07-19 ENCOUNTER — Encounter: Payer: Self-pay | Admitting: Infectious Diseases

## 2018-07-19 ENCOUNTER — Ambulatory Visit (INDEPENDENT_AMBULATORY_CARE_PROVIDER_SITE_OTHER): Payer: Medicare Other | Admitting: Infectious Diseases

## 2018-07-19 DIAGNOSIS — R2242 Localized swelling, mass and lump, left lower limb: Secondary | ICD-10-CM | POA: Insufficient documentation

## 2018-07-19 DIAGNOSIS — L03116 Cellulitis of left lower limb: Secondary | ICD-10-CM | POA: Diagnosis not present

## 2018-07-19 NOTE — Progress Notes (Signed)
   Subjective:    Patient ID: Caitlin [name], female    DOB: 1946/01/19, 72 y.o.   MRN: [id number]  HPI 72 yo F with hx of vericose veins, prev ablation. L TKR (Dr Durward Fortes), L breast CA (treated with XRT 2017).  She had vericose bleeds this spring/summer which improved with local care.  After her last bleed of her LLE she then developed swelling and pain in her ankle. She was seen in ED and was given NSAID. She returned with worsening pain ~48 h later and was told she had cellulitis. She had MRI done 10-7 that showed:  Soft tissue ulcer and cellulitis along the medial aspect of the hindfoot. No marrow signal abnormality indicative of acute osteomyelitis. She was given bactrim but developed hives. She was then changed to doxycycline.  1 week later she developed oozing from a boil on her ankle. She was seen by PCP and was started on augmentin and was seen in [place].  She had wound Cx 06-01-18 MRSA (s- doxy, bactrim, clinda).   She was seen in ID clinic on 11-15 and was given oritavancin via pharmacy 11-18.  She had repeat MRI 11-18 that showed cellulitis but no osteo. She feels like she is doing well but is worried that their swelling moving up her ankle.   Has been d/c from [facility]. She is dressing with alginate, gauze and breathable tape on her wound.   Review of Systems  Constitutional: Negative for appetite change, chills, fever and unexpected weight change.  Cardiovascular: Positive for leg swelling.  Gastrointestinal: Negative for constipation and diarrhea.  Genitourinary: Negative for difficulty urinating.  Skin: Positive for wound.       Objective:   Physical Exam Constitutional:      Appearance: Normal appearance.  Musculoskeletal:        General: Swelling present.       Feet:  Neurological:     Mental Status: She is alert.           Assessment & Plan:

## 2018-07-19 NOTE — Assessment & Plan Note (Signed)
Improved.  Will resolve.

## 2018-07-19 NOTE — Assessment & Plan Note (Signed)
Given her hx of vericosities and recent cellulitis, will set her up for u/s to r/o DVT.  Will get at [facility].

## 2018-07-20 ENCOUNTER — Ambulatory Visit (HOSPITAL_COMMUNITY)
Admission: RE | Admit: 2018-07-20 | Discharge: 2018-07-20 | Disposition: A | Discharge status: Home / Self Care | Payer: Medicare Other | Source: Ambulatory Visit | Attending: Infectious Diseases | Admitting: Infectious Diseases

## 2018-07-20 ENCOUNTER — Encounter (HOSPITAL_COMMUNITY): Payer: Medicare Other

## 2018-07-20 DIAGNOSIS — R2242 Localized swelling, mass and lump, left lower limb: Secondary | ICD-10-CM | POA: Insufficient documentation

## 2018-07-20 DIAGNOSIS — L03116 Cellulitis of left lower limb: Secondary | ICD-10-CM | POA: Insufficient documentation

## 2018-07-20 DIAGNOSIS — R600 Localized edema: Secondary | ICD-10-CM | POA: Diagnosis not present

## 2018-07-20 NOTE — Addendum Note (Signed)
Addended by: Icis Budreau C on: 07/20/2018 09:37 AM   Modules accepted: Orders

## 2018-07-24 NOTE — Progress Notes (Signed)
This encounter was created in error - please disregard.  This encounter was created in error - please disregard.

## 2018-08-16 ENCOUNTER — Telehealth: Payer: Self-pay | Admitting: *Deleted

## 2018-08-16 DIAGNOSIS — I87393 Chronic venous hypertension (idiopathic) with other complications of bilateral lower extremity: Secondary | ICD-10-CM | POA: Diagnosis not present

## 2018-08-16 DIAGNOSIS — Z1389 Encounter for screening for other disorder: Secondary | ICD-10-CM | POA: Diagnosis not present

## 2018-08-16 DIAGNOSIS — I872 Venous insufficiency (chronic) (peripheral): Secondary | ICD-10-CM | POA: Diagnosis not present

## 2018-08-16 DIAGNOSIS — J22 Unspecified acute lower respiratory infection: Secondary | ICD-10-CM | POA: Diagnosis not present

## 2018-08-16 DIAGNOSIS — E6609 Other obesity due to excess calories: Secondary | ICD-10-CM | POA: Diagnosis not present

## 2018-08-16 DIAGNOSIS — Z6836 Body mass index (BMI) 36.0-36.9, adult: Secondary | ICD-10-CM | POA: Diagnosis not present

## 2018-08-16 NOTE — Telephone Encounter (Signed)
Returning Caitlin [name]'s call regarding left leg swelling without pain and desire for re-evaluation after reviewing her medical records with [name] MD.  Left detailed telephone message on Caitlin [name]'s home phone that [facility] schedulers would call her to schedule an appointment with Dr. Scot Dock.   Caitlin [name] was last seen at [facility] by Dr. Scot Dock 02-12-2018 and in fall of 2019 she was treated for cellulitis of left leg and has been  under the care of Bobby Rumpf MD/ infectious disease.

## 2018-08-17 ENCOUNTER — Encounter: Payer: Self-pay | Admitting: Infectious Diseases

## 2018-08-17 ENCOUNTER — Ambulatory Visit (INDEPENDENT_AMBULATORY_CARE_PROVIDER_SITE_OTHER): Payer: Medicare Other | Admitting: Infectious Diseases

## 2018-08-17 DIAGNOSIS — L03116 Cellulitis of left lower limb: Secondary | ICD-10-CM

## 2018-08-17 NOTE — Assessment & Plan Note (Signed)
This has improve.  I have encouraged her to keep feet up and to wear compression hose (getting new)  She will f/u with vascular I have asked her to call and I will see her if she has further lesions which concern her.

## 2018-08-17 NOTE — Progress Notes (Signed)
   Subjective:    Patient ID: Caitlin [name], female    DOB: 08-06-1945, 73 y.o.   MRN: [id number]  HPI 73 yo F with hx of vericose veins, prev ablation. L TKR (Dr Durward Fortes), L breast CA (treated with XRT 2017).  She had vericose bleeds this spring/summer which improved with local care.  After her last bleed of her LLE she then developed swelling and pain in her ankle. She was seen in ED and was given NSAID. She returned with worsening pain ~48 h later and was told she had cellulitis. She had MRI done 10-7 that showed: Soft tissue ulcer and cellulitis along the medial aspect of the hindfoot. No marrow signal abnormality indicative of acute osteomyelitis. She was given bactrim but developed hives. She was then changed to doxycycline.  1 week later she developed oozing from a boil on her ankle. She was seen by PCP and was started on augmentin and was seen in [place].  She had wound Cx 06-01-18 MRSA (s- doxy, bactrim, clinda).   She was seen in ID clinic on 11-15 and was given oritavancin via pharmacy 11-18.  She had repeat MRI 11-18 that showed cellulitis but no osteo. She was seen in ID on 12-19: had u/s that showed no DVT.  She has been seen by vascular and was given compression stockings. Husband worried that they are too tight. She is   She still has swelling and itching in her leg.  She has called vascular and has appt 09-06-18.  Was seen yesterday for URI at pcp. Now on azithro. She was also given steroid cream for her LLE.     Review of Systems  Cardiovascular: Positive for leg swelling.  small wound on RLE from recent vein opening. No bleeding or d/c.  Please see HPI. All other systems reviewed and negative.      Objective:   Physical Exam Constitutional:      Appearance: Normal appearance.  Musculoskeletal:        General: Swelling present.       Feet:  Neurological:     Mental Status: She is alert.           Assessment & Plan:

## 2018-08-20 ENCOUNTER — Telehealth: Payer: Self-pay | Admitting: Vascular Surgery

## 2018-08-20 NOTE — Telephone Encounter (Signed)
-----   Message from Rica Records, RN sent at 08/16/2018  1:42 PM EST ----- Regarding: scheduling Contact: [phone] Please schedule a [facility] FU with Dr. Scot Dock (NO ultrasound needed as she has had 2 in last 6 months) for left leg swelling.  She was last see by Dr. Scot Dock on 02-12-2018.    Thanks, Davy Pique

## 2018-08-20 NOTE — Telephone Encounter (Signed)
sch appt [facility]vm mld ltr 09/06/2018 215pm f/u MD [facility]

## 2018-09-06 ENCOUNTER — Ambulatory Visit: Payer: Medicare Other | Admitting: Vascular Surgery

## 2018-09-25 ENCOUNTER — Encounter (INDEPENDENT_AMBULATORY_CARE_PROVIDER_SITE_OTHER): Payer: Self-pay | Admitting: *Deleted

## 2018-09-27 ENCOUNTER — Ambulatory Visit: Payer: Medicare Other | Admitting: Vascular Surgery

## 2018-09-27 ENCOUNTER — Encounter: Payer: Self-pay | Admitting: Vascular Surgery

## 2018-09-27 ENCOUNTER — Ambulatory Visit (INDEPENDENT_AMBULATORY_CARE_PROVIDER_SITE_OTHER): Payer: Medicare Other | Admitting: Vascular Surgery

## 2018-09-27 ENCOUNTER — Other Ambulatory Visit: Payer: Self-pay

## 2018-09-27 VITALS — BP 141/88 | HR 102 | Temp 97.5°F | Resp 16 | Ht 62.0 in | Wt 205.0 lb

## 2018-09-27 DIAGNOSIS — I83813 Varicose veins of bilateral lower extremities with pain: Secondary | ICD-10-CM

## 2018-09-27 DIAGNOSIS — I872 Venous insufficiency (chronic) (peripheral): Secondary | ICD-10-CM

## 2018-09-27 NOTE — Progress Notes (Signed)
REASON FOR VISIT:    Bleeding from varicose veins and chronic venous insufficiency.  ASSESSMENT & PLAN:   CHRONIC VENOUS INSUFFICIENCY: This patient has a somewhat complicated history.  She is undergone previous laser ablation of both great saphenous veins.  However she has had recurrent bleeding episodes from both ankles and also had an ulcer on her left medial malleolus which ultimately healed.  Thus she has CEAP C5 venous disease.  She has been trying conservative measures including thigh-high compression stockings with a gradient of 20-30.  She does have a difficult time with these.  She is been elevating her legs.  Her duplex scan shows reflux in a fairly long segment of the great saphenous vein in the distal thigh and around the knee on the left.  This needs the large cluster of varicose veins in her medial left calf.  In addition she has a large amount of spider veins around the ankle on the left.  Given her repeated episodes of bleeding and also a venous ulcer I think she is a good candidate for laser ablation of the distal saphenous vein.  If this does not adequately decompress the large cluster of varicose veins in her medial calf she could then have a staged stab phlebectomies (10-20 stabs).  She would then ultimately require sclerotherapy to address the issue with bleeding but this cannot be done until the proximal sources of venous hypertension are addressed.  I have discussed with her the indications and potential complications of laser ablation of the distal left great saphenous vein.  We will schedule this in the near future.  All of her questions were answered and she is agreeable to proceed.   [name], MD, FACS Beeper [phone] Office: [phone]   HPI:   Caitlin [name] is a pleasant 73 y.o. female, who I last saw on 02/12/2018 with bilateral painful varicose veins.  The patient is undergone endovenous laser ablation of both great saphenous veins in 2007 and 2008.   She initially did well after this.  More recently however she has had 2 problems.  First she has had multiple bleeding episodes from some spider veins around her ankles on both lower extremities.  She has been able to control the bleeding herself when this occurs.  She had one above the right medial malleolus and more recently one above the left medial malleolus which ultimately turned into a small ulcer.  She had developed some cellulitis in the left leg and was treated at the wound care center for 9 weeks.  She required intravenous antibiotics.  In addition she is had problems with swelling in both lower extremities which is been more significant on the left side.  She also describes aching pain and heaviness in both legs which is aggravated by standing and sitting and relieved somewhat with elevation.  She has been trying to wear thigh-high compression stockings but these rolled down and she also has a difficult time getting these on.   Past Medical History:  Diagnosis Date  . Arthritis   . Asthma   . Breast cancer ([place])   . Breast disorder    cancer  . Breast mass    benign  . Cellulitis   . GERD (gastroesophageal reflux disease)   . Heart murmur    AS CHILD  OUTGREW IT  . History of hiatal hernia   . Hyperlipidemia   . Hypertension   . Impaired fasting glucose   . Rectocele   . Varicose vein  of leg     Family History  Problem Relation Age of Onset  . Colon cancer Father   . Rectal cancer Father   . Prostate cancer Father   . Dementia Mother   . Osteoporosis Mother   . Healthy Daughter   . Healthy Son   . Pancreatic cancer Paternal Grandfather   . Heart disease Paternal Grandmother   . Heart disease Maternal Grandfather   . Breast cancer Maternal Grandmother   . Scoliosis Sister     SOCIAL HISTORY: Social History   Socioeconomic History  . Marital status: Married    Spouse name: Not on file  . Number of children: 2  . Years of education: Not on file  . Highest  education level: Not on file  Occupational History  . Occupation: retired    Fish farm manager: RETIRED    Comment: teacher  Social Needs  . Financial resource strain: Not on file  . Food insecurity:    Worry: Not on file    Inability: Not on file  . Transportation needs:    Medical: Not on file    Non-medical: Not on file  Tobacco Use  . Smoking status: Never Smoker  . Smokeless tobacco: Never Used  Substance and Sexual Activity  . Alcohol use: No  . Drug use: No  . Sexual activity: Yes    Birth control/protection: Surgical    Comment: hyst  Lifestyle  . Physical activity:    Days per week: Not on file    Minutes per session: Not on file  . Stress: Not on file  Relationships  . Social connections:    Talks on phone: Not on file    Gets together: Not on file    Attends religious service: Not on file    Active member of club or organization: Not on file    Attends meetings of clubs or organizations: Not on file    Relationship status: Not on file  . Intimate partner violence:    Fear of current or ex partner: Not on file    Emotionally abused: Not on file    Physically abused: Not on file    Forced sexual activity: Not on file  Other Topics Concern  . Not on file  Social History Narrative  . Not on file    Allergies  Allergen Reactions  . Horse-Derived Products     Reaction as a child "heart stopped"  . Sulfa Antibiotics Hives  . Tetanus Toxoid Adsorbed Palpitations    As a child    Current Outpatient Medications  Medication Sig Dispense Refill  . anastrozole (ARIMIDEX) 1 MG tablet Take 1 tablet (1 mg total) by mouth daily. 90 tablet 4  . B Complex Vitamins (B-COMPLEX/B-12 SL) Place 1 tablet under the tongue daily.     . budesonide-formoterol (SYMBICORT) 80-4.5 MCG/ACT inhaler Take 2 puffs first thing in am and then another 2 puffs about 12 hours later. 1 Inhaler 11  . Calcium Citrate-Vitamin D (CALCIUM CITRATE CHEWY BITE) 500-500 MG-UNIT CHEW Chew 500 mg by mouth 2  (two) times daily.     [name] docusate sodium (COLACE) 100 MG capsule Take 100 mg by mouth at bedtime.     [name] esomeprazole (NEXIUM) 20 MG capsule Take 40 mg by mouth daily before breakfast.     . famotidine (PEPCID) 20 MG tablet Take 20 mg by mouth at bedtime.     . Iron-Vitamin C (IRON 100/C PO) Take 1 tablet by mouth daily. Celebrate iron +  vit C    . losartan-hydrochlorothiazide (HYZAAR) 100-25 MG tablet Take 1 tablet by mouth daily.  1  . Magnesium 250 MG TABS Take 500 mg by mouth every evening.    [name] MELATONIN PO Take 1 tablet by mouth at bedtime as needed (sleep).    . Menaquinone-7 (VITAMIN K2) 100 MCG CAPS Take 100 mcg by mouth every evening.     . Multiple Vitamin (MULTIVITAMIN) capsule Take 1 capsule by mouth 2 (two) times daily with a meal. Celebrate Bariatric MVI    . Oritavancin Diphosphate (ORBACTIV) 400 MG SOLR [place] [facility]/short stay to give 1 each 0  . pravastatin (PRAVACHOL) 20 MG tablet Take 20 mg by mouth at bedtime.     . triamcinolone cream (KENALOG) 0.1 %     . azithromycin (ZITHROMAX) 250 MG tablet      No current facility-administered medications for this visit.     REVIEW OF SYSTEMS:  [X]  denotes positive finding, [ ]  denotes negative finding Cardiac  Comments:  Chest pain or chest pressure:    Shortness of breath upon exertion:    Short of breath when lying flat:    Irregular heart rhythm:        Vascular    Pain in calf, thigh, or hip brought on by ambulation: x   Pain in feet at night that wakes you up from your sleep:     Blood clot in your veins:    Leg swelling:  x       Pulmonary    Oxygen at home:    Productive cough:  x   Wheezing:  x       Neurologic    Sudden weakness in arms or legs:     Sudden numbness in arms or legs:     Sudden onset of difficulty speaking or slurred speech:    Temporary loss of vision in one eye:     Problems with dizziness:         Gastrointestinal    Blood in stool:     Vomited blood:         Genitourinary      Burning when urinating:     Blood in urine:        Psychiatric    Major depression:         Hematologic    Bleeding problems: x  from spider veins around both ankles.  Problems with blood clotting too easily:        Skin    Rashes or ulcers:        Constitutional    Fever or chills:     PHYSICAL EXAM:   Vitals:   09/27/18 1214  BP: (!) 141/88  Pulse: (!) 102  Resp: 16  Temp: (!) 97.5 F (36.4 C)  TempSrc: Oral  SpO2: 96%  Weight: 205 lb (93 kg)  Height: 5\' 2"  (1.575 m)    GENERAL: The patient is a well-nourished female, in no acute distress. The vital signs are documented above. CARDIAC: There is a regular rate and rhythm.  VASCULAR: I do not detect carotid bruits. She has palpable posterior tibial and dorsalis pedis pulses bilaterally. VENOUS: She does have a large cluster of varicose veins in her medial right thigh and medial right calf.  On the left side she has a large cluster of varicose veins in her medial left calf.  She also has spider veins around both ankles where she had the bleeding issues.  She has  some spider veins and reticular veins in the posterior thighs and calves bilaterally.  She has mild bilateral lower extremity swelling. I looked at the saphenous veins with the SonoSite.  The saphenous vein in the thighs on both sides is occluded.  There are segments where there is recanalization of the saphenous vein from collaterals with reflux in the distal thigh. PULMONARY: There is good air exchange bilaterally without wheezing or rales. ABDOMEN: Soft and non-tender with normal pitched bowel sounds.  MUSCULOSKELETAL: There are no major deformities or cyanosis. NEUROLOGIC: No focal weakness or paresthesias are detected. SKIN: There are no ulcers or rashes noted. PSYCHIATRIC: The patient has a normal affect.  DATA:    VENOUS REFLUX STUDY: I reviewed the previous venous reflux study that was done in July 2019.  On the left side, which is the side that had  the most recent bleeding episode and also had the venous ulcer, she is noted to have a tortuous anterior accessory saphenous vein which feeds the great saphenous vein in the distal thigh and proximal calf.  The vein is significantly dilated with reflux.  This feeds the large cluster of varicose veins in her medial left calf.  On the right side, there is a short segment of reflux in the great saphenous vein in the distal thigh.  The vein is slightly dilated here.  There is a tortuous anterior sensory saphenous vein.  There is no reflux in the small saphenous vein bilaterally.  VENOUS DUPLEX: I also reviewed the venous duplex scan that was done on 07/19/2018 to rule out a DVT.  There is no evidence of DVT in either lower extremity.  A total of 45 minutes was spent on this visit. 25 minutes was face to face time. More than 50% of the time was spent on counseling and coordinating with the patient.

## 2018-10-03 DIAGNOSIS — J22 Unspecified acute lower respiratory infection: Secondary | ICD-10-CM | POA: Diagnosis not present

## 2018-10-03 DIAGNOSIS — J040 Acute laryngitis: Secondary | ICD-10-CM | POA: Diagnosis not present

## 2018-10-03 DIAGNOSIS — Z6837 Body mass index (BMI) 37.0-37.9, adult: Secondary | ICD-10-CM | POA: Diagnosis not present

## 2018-10-09 ENCOUNTER — Ambulatory Visit (INDEPENDENT_AMBULATORY_CARE_PROVIDER_SITE_OTHER)
Admission: RE | Admit: 2018-10-09 | Discharge: 2018-10-09 | Disposition: A | Discharge status: Home / Self Care | Payer: Medicare Other | Source: Ambulatory Visit | Attending: Internal Medicine | Admitting: Internal Medicine

## 2018-10-09 ENCOUNTER — Ambulatory Visit (INDEPENDENT_AMBULATORY_CARE_PROVIDER_SITE_OTHER): Payer: Medicare Other | Admitting: Internal Medicine

## 2018-10-09 ENCOUNTER — Encounter: Payer: Self-pay | Admitting: Internal Medicine

## 2018-10-09 VITALS — BP 142/76 | HR 79 | Temp 98.1°F | Ht 62.5 in | Wt 212.0 lb

## 2018-10-09 DIAGNOSIS — R05 Cough: Secondary | ICD-10-CM | POA: Diagnosis not present

## 2018-10-09 DIAGNOSIS — J45991 Cough variant asthma: Secondary | ICD-10-CM | POA: Diagnosis not present

## 2018-10-09 MED ORDER — BUDESONIDE-FORMOTEROL FUMARATE 80-4.5 MCG/ACT IN AERO: INHALATION_SPRAY | RESPIRATORY_TRACT | 11 refills | Status: DC

## 2018-10-09 MED ORDER — PREDNISONE 10 MG PO TABS: ORAL_TABLET | ORAL | 0 refills | Status: DC

## 2018-10-09 MED ORDER — BUDESONIDE-FORMOTEROL FUMARATE 80-4.5 MCG/ACT IN AERO: 2.0000 | INHALATION_SPRAY | Freq: Two times a day (BID) | RESPIRATORY_TRACT | 0 refills | Status: DC

## 2018-10-09 NOTE — Patient Instructions (Signed)
Plan A = Automatic = Symbicort 80 Take 2 puffs first thing in am and then another 2 puffs about 12 hours later.   Work on inhaler technique:  relax and gently blow all the way out then take a nice smooth deep breath back in, triggering the inhaler at same time you start breathing in.  Hold for up to 5 seconds if you can. Blow out thru nose. Rinse and gargle with water when done   Prednisone 10 mg take  4 each am x 2 days,   2 each am x 2 days,  1 each am x 2 days and stop - take with breakfast    Plan B = Backup - only use your albuterol nebulizer if you first try Plan B and it fails to help > ok to use the nebulizer up to every 4 hours but if start needing it regularly call for immediate appointment   Please remember to go to the  x-ray department  for your tests - we will call you with the results when they are available     Please schedule a follow up office visit in 6 weeks, call sooner if needed with all medications /inhalers/ solutions in hand so we can verify exactly what you are taking. This includes all medications from all doctors and over the counters

## 2018-10-09 NOTE — Progress Notes (Signed)
Caitlin [name], female    DOB: 08-11-1945,    MRN: [id number]    Brief patient profile:  26 yowf never smoker asthma as child outgrew it about age teens then some seasonal sinus problems since the 1990s  but around 2007 developed recurrent pattern of breathing problems esp in spring dx as asthma > asmanex and singulair seemed to work ok until around 2012 when had her 1st hernia surgery and since then symptoms persistent year round so 03/27/2012 referred to pulmonary clinic     History of Present Illness  03/27/2012 1st pulmonary ov / Aneta Hendershott cc > one year daily p stirs in am (but doesn't wake her) cc sense of  congestion/variable pattern prod of thick sputum/ variable color better p neb with alb/pulmicort.  Sinus spring > fall and not an active problem. Never rx with prednisone. No gagging, some urination.  All started p Feb 2012 p lap chole and several other procedures and coughs so hard it's causing a problem with the wound healing resulting in worsening hernia and has been told she will need more surgery. rec Nexium 40 mg Take 30-60 min before first meal of the day and Pepcid 20 mg one at bedtime until return mucinex dm for cough 2 every 12 hours GERD   Prednisone 10 mg take  4 each am x 2 days,   2 each am x 2 days,  1 each am x2days and stop  Stop asmanex and continue pulmicort with albuterol twice daily > resolved short term         03/05/2013 f/u ov/Caitlin [name] re asthma     Chief Complaint  Patient presents with  . Follow-up    Breathing has improved.  No SOB, wheezing, chest tightenss, chest pain, or cough noted at this time.  On  symbicort 160 one twice daily also singulair and never nebulizer any more rec Ok to stop singulair Plan A = automatic = Symbicort 160 Take 1 puffs first thing in am and then another 2 puffs about 12 hours later - if doing great off singulair in a couple of weeks then ok to stop the symbicort too - it will start working in 5 min if needed  Plan B =  backup Only use your albuterol as a rescue medication to be used For cough > mucinex dm 1200 every 12 hours as needed  For cough/ congestion > mucinex as needed     04/13/2018  Pulmonary/ re-establish re recurrent cough  Chief Complaint  Patient presents with  . Pulmonary Consult    Referred by Dr. Hilma Favors. Pt c/o cough for the past months- occ prod with green to yellow sputum.    couple times a year tends to flare after stops meds/ never restarted maint symb/ takes advair dpi "prn"  S/p gastric sleeve 2015 got down to 172 but slowly increasing S/p R breast ca/ rad on Right side only completed July 2017  Cough x sev months x  Daytime  Maybe worse in heat/ not after meals or hs  Not limited by breathing from desired activities  Presently on pred/ abx for L leg rash ? Cellulitis rec Nexium 40 mg Take 30-60 min before first meal of the day plus  pepcid 20 mg after supper  Symbicort 80 Take 2 puffs first thing in am and then another 2 puffs about 12 hours later.  Work on inhaler technique:   For cough > delsym 2 tsp every 12 hours  GERD  Diet  05/15/2018  f/u ov/Caitlin [name] re:  Cough variant asthma all symptoms resolved on symb 80 2bid  Chief Complaint  Patient presents with  . Follow-up    Doing better cough is gone feels symbicort is working but it is expensive.   Dyspnea:  Not limited by breathing from desired activities   Cough: resolved  Sleeping: flat/ one pillow SABA use: none rec Ok to use symbicort 80 up to 2 pffs every 12hours as needed to control your respiratory symptoms  If after a week if not 100% better then we need to see you right away  - bring your formulary with you from your insurance    10/09/2018  f/u ov/Caitlin [name] re: cough variant asthma  Chief Complaint  Patient presents with  . Acute Visit    Pt c/o chest congestion and cough for the past 10 days. Her cough has been prod at times with green sputum.   doing fine off symbicort chronically and  on gerd rx then 10  d prior to [place] then cough/ congestion rx zpak already completed 2 d prior to OV   Has neb not using  Worse cough p head hits pillows / no fever    No obvious day to day or daytime variability or assoc  mucus plugs or hemoptysis or cp or chest tightness, subjective wheeze or overt sinus or hb symptoms.    Also denies any obvious fluctuation of symptoms with weather or environmental changes or other aggravating or alleviating factors except as outlined above   No unusual exposure hx or h/o childhood pna  knowledge of premature birth.  Current Allergies, Complete Past Medical History, Past Surgical History, Family History, and Social History were reviewed in [employer] record.  ROS  The following are not active complaints unless bolded Hoarseness, sore throat, dysphagia, dental problems, itching, sneezing,  nasal congestion or discharge of excess mucus or purulent secretions, ear ache,   fever, chills, sweats, unintended wt loss or wt gain, classically pleuritic or exertional cp,  orthopnea pnd or arm/hand swelling  or leg swelling, presyncope, palpitations, abdominal pain, anorexia, nausea, vomiting, diarrhea  or change in bowel habits or change in bladder habits, change in stools or change in urine, dysuria, hematuria,  rash, arthralgias, visual complaints, headache, numbness, weakness or ataxia or problems with walking or coordination,  change in mood or  memory.        Current Meds  Medication Sig  . anastrozole (ARIMIDEX) 1 MG tablet Take 1 tablet (1 mg total) by mouth daily.  . B Complex Vitamins (B-COMPLEX/B-12 SL) Place 1 tablet under the tongue daily.   . budesonide-formoterol (SYMBICORT) 80-4.5 MCG/ACT inhaler Take 2 puffs first thing in am and then another 2 puffs about 12 hours later.  . Calcium Citrate-Vitamin D (CALCIUM CITRATE CHEWY BITE) 500-500 MG-UNIT CHEW Chew 500 mg by mouth 2 (two) times daily.   [name] docusate sodium (COLACE) 100 MG capsule Take 100 mg  by mouth at bedtime.   [name] esomeprazole (NEXIUM) 20 MG capsule Take 40 mg by mouth daily before breakfast.   . famotidine (PEPCID) 20 MG tablet Take 20 mg by mouth at bedtime.   . Iron-Vitamin C (IRON 100/C PO) Take 1 tablet by mouth daily. Celebrate iron + vit C  . losartan-hydrochlorothiazide (HYZAAR) 100-25 MG tablet Take 1 tablet by mouth daily.  . Magnesium 250 MG TABS Take 500 mg by mouth every evening.  [name] MELATONIN PO Take 1 tablet by mouth at bedtime  as needed (sleep).  . Menaquinone-7 (VITAMIN K2) 100 MCG CAPS Take 100 mcg by mouth every evening.   . Multiple Vitamin (MULTIVITAMIN) capsule Take 1 capsule by mouth 2 (two) times daily with a meal. Celebrate Bariatric MVI  . pravastatin (PRAVACHOL) 20 MG tablet Take 20 mg by mouth at bedtime.   . triamcinolone cream (KENALOG) 0.1 %                  Objective:     amb obese wf rattling cough    10/09/2018       212  05/15/2018     202   04/13/18 208 lb (94.3 kg)  04/07/18 205 lb (93 kg)  02/12/18 205 lb (93 kg)    Vital signs reviewed - Note on arrival 02 sats  99% on RA     HEENT: nl dentition, and oropharynx. Nl external ear canals without cough reflex - mild/mod bilateral non-specific turbinate edema     NECK :  without JVD/Nodes/TM/ nl carotid upstrokes bilaterally   LUNGS: no acc muscle use,  Nl contour chest with minimal insp/exp rhonchi  bilaterally without cough on insp or exp maneuvers   CV:  RRR  no s3 or murmur or increase in P2, and no edema   ABD:  soft and nontender with nl inspiratory excursion in the supine position. No bruits or organomegaly appreciated, bowel sounds nl  MS:  Nl gait/ ext warm without deformities, calf tenderness, cyanosis or clubbing No obvious joint restrictions   SKIN: warm and dry without lesions    NEURO:  alert, approp, nl sensorium with  no motor or cerebellar deficits apparent.       CXR PA and Lateral:   10/09/2018 :    I personally reviewed images and agree with  radiology impression as follows:   No active cardiopulmonary disease.           Assessment

## 2018-10-10 NOTE — Progress Notes (Signed)
Spoke with pt and notified of results per Dr. Wert. Pt verbalized understanding and denied any questions. 

## 2018-10-13 ENCOUNTER — Encounter: Payer: Self-pay | Admitting: Internal Medicine

## 2018-10-13 NOTE — Assessment & Plan Note (Addendum)
Onset in childhood  - Eos 0.3 04/07/18 - d/c advair 04/13/2018  - 04/13/2018   try symb 80 2bid  - Allergy profile 05/15/2018 >  Eos 0.3 /  IgE  1227 RAST pos to everything but cats and mold  - 05/15/2018    change symbicort to 80 2bid "prn"  - 10/09/2018  After extensive coaching inhaler device,  effectiveness =   75% from a baseline of 25%   Acute flare in setting of uri s/p zpak with nl cxr so rec  1) restart symb 80 2bid "prn" Based on two studies from NEJM  378; 20 p 1865 (2018) and 380 : p2020-30 (2019) in pts with mild asthma it is reasonable to use low dose symbicort eg 80 2bid "prn" flare in this setting but I emphasized this was only shown with symbicort and takes advantage of the rapid onset of action but is not the same as "rescue therapy" but can be stopped once the acute symptoms have resolved and the need for rescue has been minimized (< 2 x weekly)    2) Prednisone 10 mg take  4 each am x 2 days,   2 each am x 2 days,  1 each am x 2 days and stop    3) use neb prn cough/ wheeze instead of hfa since hfa may make cough worse   4) f/u in 6 weeks with all meds in hand using a trust but verify approach to confirm accurate Medication  Reconciliation The principal here is that until we are certain that the  patients are doing what we've asked, it makes no sense to ask them to do more.    I had an extended discussion with the patient reviewing all relevant studies completed to date and  lasting 15 to 20 minutes of a 25 minute acute office visit    See device teaching which extended face to face time for this visit.  Each maintenance medication was reviewed in detail including emphasizing most importantly the difference between maintenance and prns and under what circumstances the prns are to be triggered using an action plan format that is not reflected in the computer generated alphabetically organized AVS which I have not found useful in most complex patients, especially with  respiratory illnesses  Please see AVS for specific instructions unique to this visit that I personally wrote and verbalized to the the pt in detail and then reviewed with pt  by my nurse highlighting any  changes in therapy recommended at today's visit to their plan of care.

## 2018-11-20 ENCOUNTER — Ambulatory Visit: Payer: Medicare Other | Admitting: Internal Medicine

## 2018-11-20 ENCOUNTER — Other Ambulatory Visit (HOSPITAL_COMMUNITY): Payer: Self-pay | Admitting: Internal Medicine

## 2018-11-20 ENCOUNTER — Other Ambulatory Visit: Payer: Self-pay

## 2018-11-20 ENCOUNTER — Telehealth: Payer: Self-pay | Admitting: Primary Care

## 2018-11-20 ENCOUNTER — Ambulatory Visit (INDEPENDENT_AMBULATORY_CARE_PROVIDER_SITE_OTHER): Payer: Medicare Other | Admitting: Primary Care

## 2018-11-20 ENCOUNTER — Encounter: Payer: Self-pay | Admitting: Primary Care

## 2018-11-20 DIAGNOSIS — J45991 Cough variant asthma: Secondary | ICD-10-CM | POA: Diagnosis not present

## 2018-11-20 DIAGNOSIS — C50211 Malignant neoplasm of upper-inner quadrant of right female breast: Secondary | ICD-10-CM

## 2018-11-20 NOTE — Progress Notes (Signed)
Virtual Visit via Telephone Note  I connected with Caitlin [name] on 11/20/18 at 11:45 AM EDT by telephone and verified that I am speaking with the correct person using two identifiers.   I discussed the limitations, risks, security and privacy concerns of performing an evaluation and management service by telephone and the availability of in person appointments. I also discussed with the patient that there may be a patient responsible charge related to this service. The patient expressed understanding and agreed to proceed.   History of Present Illness: 72 year old female, never smoked. PMH cough variant asthma, GERD, breast cancer, obesity. Patient of Dr. Melvyn Novas, last seen on 10/09/18 for chest congestion and cough x 10 days. Cough productive with green sputum. Completed RX zpack, given prednisone taper. CXR showed no active cardiopulmonary disease. Restarted on Symbicort 80 twice daily. Eos 300, IgE 1227, pos RAST in 2019.   11/20/2018 Patient called today for 6 week follow-up. She has been doing well, backed off Symbicort to once a day. Congestion was her primarily concern which has resolved. Denies shortness of breath, wheezing or cough. Afebrile.   Observations/Objective:  - No shortness of breath, wheezing or cough  Assessment and Plan:  URI with cough variant asthma - Improved after zpack and prednisone taper - Continue Symbicort once daily - Recommend adding daily antihistamine such as Claritin/Zyrtec during allergy season  Follow Up Instructions:   - Follow up with Dr. Melvyn Novas as needed   I discussed the assessment and treatment plan with the patient. The patient was provided an opportunity to ask questions and all were answered. The patient agreed with the plan and demonstrated an understanding of the instructions.   The patient was advised to call back or seek an in-person evaluation if the symptoms worsen or if the condition fails to improve as anticipated.  I provided 15  minutes of non-face-to-face time during this encounter.   Martyn Ehrich, NP

## 2018-11-20 NOTE — Telephone Encounter (Signed)
Called pt and started her televisit.  Nothing further is needed.

## 2018-11-20 NOTE — Patient Instructions (Addendum)
Continue Symbicort daily  Recommend adding daily antihistamine such as Claritin OR Zyrtec during allergy season  Follow up with Dr. Melvyn Novas as needed or if symptoms worsen   Asthma, Adult  Asthma is a long-term (chronic) condition in which the airways get tight and narrow. The airways are the breathing passages that lead from the nose and mouth down into the lungs. A person with asthma will have times when symptoms get worse. These are called asthma attacks. They can cause coughing, whistling sounds when you breathe (wheezing), shortness of breath, and chest pain. They can make it hard to breathe. There is no cure for asthma, but medicines and lifestyle changes can help control it. There are many things that can bring on an asthma attack or make asthma symptoms worse (triggers). Common triggers include:  Mold.  Dust.  Cigarette smoke.  Cockroaches.  Things that can cause allergy symptoms (allergens). These include animal skin flakes (dander) and pollen from trees or grass.  Things that pollute the air. These may include household cleaners, wood smoke, smog, or chemical odors.  Cold air, weather changes, and wind.  Crying or laughing hard.  Stress.  Certain medicines or drugs.  Certain foods such as dried fruit, potato chips, and grape juice.  Infections, such as a cold or the flu.  Certain medical conditions or diseases.  Exercise or tiring activities. Asthma may be treated with medicines and by staying away from the things that cause asthma attacks. Types of medicines may include:  Controller medicines. These help prevent asthma symptoms. They are usually taken every day.  Fast-acting reliever or rescue medicines. These quickly relieve asthma symptoms. They are used as needed and provide short-term relief.  Allergy medicines if your attacks are brought on by allergens.  Medicines to help control the body's defense (immune) system. Follow these instructions at  home: Avoiding triggers in your home  Change your heating and air conditioning filter often.  Limit your use of fireplaces and wood stoves.  Get rid of pests (such as roaches and mice) and their droppings.  Throw away plants if you see mold on them.  Clean your floors. Dust regularly. Use cleaning products that do not smell.  Have someone vacuum when you are not home. Use a vacuum cleaner with a HEPA filter if possible.  Replace carpet with wood, tile, or vinyl flooring. Carpet can trap animal skin flakes and dust.  Use allergy-proof pillows, mattress covers, and box spring covers.  Wash bed sheets and blankets every week in hot water. Dry them in a dryer.  Keep your bedroom free of any triggers.  Avoid pets and keep windows closed when things that cause allergy symptoms are in the air.  Use blankets that are made of polyester or cotton.  Clean bathrooms and kitchens with bleach. If possible, have someone repaint the walls in these rooms with mold-resistant paint. Keep out of the rooms that are being cleaned and painted.  Wash your hands often with soap and water. If soap and water are not available, use hand sanitizer.  Do not allow anyone to smoke in your home. General instructions  Take over-the-counter and prescription medicines only as told by your doctor. ? Talk with your doctor if you have questions about how or when to take your medicines. ? Make note if you need to use your medicines more often than usual.  Do not use any products that contain nicotine or tobacco, such as cigarettes and e-cigarettes. If you need help quitting,  ask your doctor.  Stay away from secondhand smoke.  Avoid doing things outdoors when allergen counts are high and when air quality is low.  Wear a ski mask when doing outdoor activities in the winter. The mask should cover your nose and mouth. Exercise indoors on cold days if you can.  Warm up before you exercise. Take time to cool down  after exercise.  Use a peak flow meter as told by your doctor. A peak flow meter is a tool that measures how well the lungs are working.  Keep track of the peak flow meter's readings. Write them down.  Follow your asthma action plan. This is a written plan for taking care of your asthma and treating your attacks.  Make sure you get all the shots (vaccines) that your doctor recommends. Ask your doctor about a flu shot and a pneumonia shot.  Keep all follow-up visits as told by your doctor. This is important. Contact a doctor if:  You have wheezing, shortness of breath, or a cough even while taking medicine to prevent attacks.  The mucus you cough up (sputum) is thicker than usual.  The mucus you cough up changes from clear or white to yellow, green, gray, or bloody.  You have problems from the medicine you are taking, such as: ? A rash. ? Itching. ? Swelling. ? Trouble breathing.  You need reliever medicines more than 2-3 times a week.  Your peak flow reading is still at 50-79% of your personal best after following the action plan for 1 hour.  You have a fever. Get help right away if:  You seem to be worse and are not responding to medicine during an asthma attack.  You are short of breath even at rest.  You get short of breath when doing very little activity.  You have trouble eating, drinking, or talking.  You have chest pain or tightness.  You have a fast heartbeat.  Your lips or fingernails start to turn blue.  You are light-headed or dizzy, or you faint.  Your peak flow is less than 50% of your personal best.  You feel too tired to breathe normally. Summary  Asthma is a long-term (chronic) condition in which the airways get tight and narrow. An asthma attack can make it hard to breathe.  Asthma cannot be cured, but medicines and lifestyle changes can help control it.  Make sure you understand how to avoid triggers and how and when to use your  medicines. This information is not intended to replace advice given to you by your health care provider. Make sure you discuss any questions you have with your health care provider. Document Released: 01/04/2008 Document Revised: 08/22/2016 Document Reviewed: 08/22/2016 Elsevier Interactive Patient Education  2019 [employer].

## 2018-11-21 ENCOUNTER — Other Ambulatory Visit (HOSPITAL_COMMUNITY): Payer: Medicare Other

## 2018-11-25 NOTE — Progress Notes (Signed)
Chart and office note reviewed in detail  > agree with a/p as outlined    

## 2018-11-26 ENCOUNTER — Other Ambulatory Visit (HOSPITAL_COMMUNITY): Payer: Self-pay

## 2018-11-26 DIAGNOSIS — M8580 Other specified disorders of bone density and structure, unspecified site: Secondary | ICD-10-CM

## 2018-11-26 DIAGNOSIS — C50211 Malignant neoplasm of upper-inner quadrant of right female breast: Secondary | ICD-10-CM

## 2018-11-27 ENCOUNTER — Ambulatory Visit (HOSPITAL_COMMUNITY): Admission: RE | Admit: 2018-11-27 | Payer: Medicare Other | Source: Ambulatory Visit

## 2018-11-27 ENCOUNTER — Other Ambulatory Visit: Payer: Self-pay

## 2018-11-27 ENCOUNTER — Inpatient Hospital Stay (HOSPITAL_COMMUNITY): Payer: Medicare Other | Attending: Hematology

## 2018-11-27 ENCOUNTER — Ambulatory Visit (HOSPITAL_COMMUNITY): Payer: Medicare Other

## 2018-11-27 ENCOUNTER — Ambulatory Visit (HOSPITAL_COMMUNITY)
Admission: RE | Admit: 2018-11-27 | Discharge: 2018-11-27 | Disposition: A | Discharge status: Home / Self Care | Payer: Medicare Other | Source: Ambulatory Visit | Attending: Internal Medicine | Admitting: Internal Medicine

## 2018-11-27 DIAGNOSIS — Z79811 Long term (current) use of aromatase inhibitors: Secondary | ICD-10-CM | POA: Insufficient documentation

## 2018-11-27 DIAGNOSIS — Z923 Personal history of irradiation: Secondary | ICD-10-CM | POA: Insufficient documentation

## 2018-11-27 DIAGNOSIS — C50211 Malignant neoplasm of upper-inner quadrant of right female breast: Secondary | ICD-10-CM | POA: Diagnosis not present

## 2018-11-27 DIAGNOSIS — J45909 Unspecified asthma, uncomplicated: Secondary | ICD-10-CM | POA: Insufficient documentation

## 2018-11-27 DIAGNOSIS — Z170 Estrogen receptor positive status [ER+]: Secondary | ICD-10-CM | POA: Diagnosis not present

## 2018-11-27 DIAGNOSIS — M8580 Other specified disorders of bone density and structure, unspecified site: Secondary | ICD-10-CM | POA: Diagnosis not present

## 2018-11-27 DIAGNOSIS — Z7951 Long term (current) use of inhaled steroids: Secondary | ICD-10-CM | POA: Insufficient documentation

## 2018-11-27 DIAGNOSIS — E785 Hyperlipidemia, unspecified: Secondary | ICD-10-CM | POA: Insufficient documentation

## 2018-11-27 DIAGNOSIS — Z853 Personal history of malignant neoplasm of breast: Secondary | ICD-10-CM | POA: Diagnosis not present

## 2018-11-27 DIAGNOSIS — I10 Essential (primary) hypertension: Secondary | ICD-10-CM | POA: Insufficient documentation

## 2018-11-27 DIAGNOSIS — Z79899 Other long term (current) drug therapy: Secondary | ICD-10-CM | POA: Insufficient documentation

## 2018-11-27 DIAGNOSIS — R922 Inconclusive mammogram: Secondary | ICD-10-CM | POA: Diagnosis not present

## 2018-11-27 LAB — CBC WITH DIFFERENTIAL/PLATELET
Abs Immature Granulocytes: 0.01 10*3/uL (ref 0.00–0.07)
Basophils Absolute: 0.1 10*3/uL (ref 0.0–0.1)
Basophils Relative: 1 %
Eosinophils Absolute: 0.4 10*3/uL (ref 0.0–0.5)
Eosinophils Relative: 7 %
HCT: 36.2 % (ref 36.0–46.0)
Hemoglobin: 12.1 g/dL (ref 12.0–15.0)
Immature Granulocytes: 0 %
Lymphocytes Relative: 26 %
Lymphs Abs: 1.6 10*3/uL (ref 0.7–4.0)
MCH: 34.6 pg — ABNORMAL HIGH (ref 26.0–34.0)
MCHC: 33.4 g/dL (ref 30.0–36.0)
MCV: 103.4 fL — ABNORMAL HIGH (ref 80.0–100.0)
Monocytes Absolute: 0.6 10*3/uL (ref 0.1–1.0)
Monocytes Relative: 10 %
Neutro Abs: 3.6 10*3/uL (ref 1.7–7.7)
Neutrophils Relative %: 56 %
Platelets: 208 10*3/uL (ref 150–400)
RBC: 3.5 MIL/uL — ABNORMAL LOW (ref 3.87–5.11)
RDW: 12.8 % (ref 11.5–15.5)
WBC: 6.3 10*3/uL (ref 4.0–10.5)
nRBC: 0 % (ref 0.0–0.2)

## 2018-11-27 LAB — COMPREHENSIVE METABOLIC PANEL
ALT: 39 U/L (ref 0–44)
AST: 51 U/L — ABNORMAL HIGH (ref 15–41)
Albumin: 3.9 g/dL (ref 3.5–5.0)
Alkaline Phosphatase: 108 U/L (ref 38–126)
Anion gap: 10 (ref 5–15)
BUN: 14 mg/dL (ref 8–23)
CO2: 26 mmol/L (ref 22–32)
Calcium: 9.3 mg/dL (ref 8.9–10.3)
Chloride: 99 mmol/L (ref 98–111)
Creatinine, Ser: 0.67 mg/dL (ref 0.44–1.00)
GFR calc Af Amer: >60 mL/min (ref >60)
GFR calc non Af Amer: >60 mL/min (ref >60)
Glucose, Bld: 93 mg/dL (ref 70–99)
Potassium: 3.9 mmol/L (ref 3.5–5.1)
Sodium: 135 mmol/L (ref 135–145)
Total Bilirubin: 0.8 mg/dL (ref 0.3–1.2)
Total Protein: 7.1 g/dL (ref 6.5–8.1)

## 2018-11-27 LAB — LACTATE DEHYDROGENASE: LDH: 129 U/L (ref 98–192)

## 2018-11-28 ENCOUNTER — Inpatient Hospital Stay (HOSPITAL_BASED_OUTPATIENT_CLINIC_OR_DEPARTMENT_OTHER): Payer: Medicare Other | Admitting: Nurse Practitioner

## 2018-11-28 DIAGNOSIS — I10 Essential (primary) hypertension: Secondary | ICD-10-CM | POA: Diagnosis not present

## 2018-11-28 DIAGNOSIS — Z170 Estrogen receptor positive status [ER+]: Secondary | ICD-10-CM

## 2018-11-28 DIAGNOSIS — Z7951 Long term (current) use of inhaled steroids: Secondary | ICD-10-CM

## 2018-11-28 DIAGNOSIS — E785 Hyperlipidemia, unspecified: Secondary | ICD-10-CM

## 2018-11-28 DIAGNOSIS — Z923 Personal history of irradiation: Secondary | ICD-10-CM | POA: Diagnosis not present

## 2018-11-28 DIAGNOSIS — Z79811 Long term (current) use of aromatase inhibitors: Secondary | ICD-10-CM | POA: Diagnosis not present

## 2018-11-28 DIAGNOSIS — Z79899 Other long term (current) drug therapy: Secondary | ICD-10-CM | POA: Diagnosis not present

## 2018-11-28 DIAGNOSIS — C50211 Malignant neoplasm of upper-inner quadrant of right female breast: Secondary | ICD-10-CM | POA: Diagnosis not present

## 2018-11-28 DIAGNOSIS — J45909 Unspecified asthma, uncomplicated: Secondary | ICD-10-CM

## 2018-11-28 DIAGNOSIS — M8580 Other specified disorders of bone density and structure, unspecified site: Secondary | ICD-10-CM

## 2018-11-28 MED ORDER — ANASTROZOLE 1 MG PO TABS: 1.0000 mg | ORAL_TABLET | Freq: Every day | ORAL | 4 refills | Status: DC

## 2018-11-28 NOTE — Assessment & Plan Note (Addendum)
1.  Stage I right breast cancer: - Patient had her regular screening mammogram on 10/05/2015 which was BI-RADS Category 0 incomplete. - She had a diagnostic mammogram on 10/20/2015 which was BI-RADS Category 4 suspicious.  It showed a 5 mm right breast mass at the 1 o'clock position. -They set her up for an ultrasound guided biopsy of the mass on 10/27/2015.  It showed invasive ductal carcinoma and was ER/PR 100% positive.  HER-2 negative.  Ki-67 at 3%. - She had a lumpectomy and sentinel node biopsy with Dr. Barry Dienes on 11/11/2015. -She was treated with adjuvant radiation. -Her Oncotype evaluation placed her in the low risk category. - She was placed on Arimidex 5/2017she will continue this for 5 years. - Her last mammogram on 11/27/2018 which showed B RADS category 2 benign follow-up in 1 year. - She will follow-up in 6 months with repeat labs.  2.  Osteopenia: - She had a bone density test on 10/23/2017 - It showed a T score of -1.4 which is osteopenia. -She is taking calcium and vitamin D daily as recommended. -We will repeat a bone density test with her yearly mammogram in March 2021

## 2018-11-28 NOTE — Progress Notes (Signed)
[place], [place] [address]   CLINIC:  Medical Oncology/Hematology  PCP:  Sharilyn Sites, [place] [address] [phone]   REASON FOR VISIT: Follow-up for right breast cancer  CURRENT THERAPY: Arimidex daily  BRIEF ONCOLOGIC HISTORY:    Breast cancer of upper-inner quadrant of right female breast ([name])   09/24/2014 Imaging    DEXA with normal bone density    10/06/2015 Imaging    Screening bilateral mammogram BI-RADS Category 0, R breast possible mass, L breast possible mass    10/20/2015 Imaging    Diagnostic B mammogram, R breast Ultrasound with suspicious mass in the R breast at the 1 o clock location 5 cm from the nipple, LN in the low R axillae with cortex nodularity    10/27/2015 Initial Biopsy    Ultrasound guided biopsy of R axillary LN, BENIGN. Ultrasound guided biopsy of R breast with invasive ductal carcinoma    11/04/2015 Initial Diagnosis    Breast cancer of upper-inner quadrant of right female breast ([place])    11/09/2015 Procedure    Radioactive seed localization R breast    11/11/2015 Surgery    R breast radioactive seed localized lumpectomy and seed targeted sentinel LN biopsy    11/11/2015 Pathology Results    Invasive ductal carcinoma Grade I/III spanning 1.1 cm, lobular neoplasia (LCIS) resection margins negative. 3 negative sentinel nodes ER (100%), PR (100%), Her2 neu negative pT1c, pN0    12/11/2015 Oncotype testing    Recurrence Score result 11, 10 year risk of distant recurrence with tamoxifen alone 8% (low-risk)    01/11/2016 - 02/04/2016 Radiation Therapy    Adjuvant breast radiation ([facility]): 42.72 Gy in 16 fractions to the right breast    01/2016 -  Anti-estrogen oral therapy    Anastrazole 1 mg daily. Planned duration of therapy: 5 years.       CANCER STAGING: Cancer Staging Breast cancer of upper-inner quadrant of right female breast [facility]) Staging form: Breast, AJCC  7th Edition - Pathologic stage from 11/11/2015: Stage IA (T1c, N0, cM0) - Signed by Holley Bouche, NP on 04/18/2016    INTERVAL HISTORY:  Ms. Caitlin [name] 73 y.o. female returns for routine follow-up for right breast cancer.  She is doing well since her last visit she has no complaints at this time.  She is taking her calcium and vitamin D as recommended.  She is taking her Arimidex daily with no side effects. Denies any nausea, vomiting, or diarrhea. Denies any new pains. Had not noticed any recent bleeding such as epistaxis, hematuria or hematochezia. Denies recent chest pain on exertion, shortness of breath on minimal exertion, pre-syncopal episodes, or palpitations. Denies any numbness or tingling in hands or feet. Denies any recent fevers, infections, or recent hospitalizations. Patient reports appetite at 100 % and energy level at 75%.  She is eating well and maintaining her weight at this time   REVIEW OF SYSTEMS:  Review of Systems  All other systems reviewed and are negative.    PAST MEDICAL/SURGICAL HISTORY:  Past Medical History:  Diagnosis Date  . Arthritis   . Asthma   . Breast cancer ([place])   . Breast disorder    cancer  . Breast mass    benign  . Cellulitis   . GERD (gastroesophageal reflux disease)   . Heart murmur    AS CHILD  OUTGREW IT  . History of hiatal hernia   . Hyperlipidemia   .  Hypertension   . Impaired fasting glucose   . Rectocele   . Varicose vein of leg    Past Surgical History:  Procedure Laterality Date  . BREAST LUMPECTOMY WITH RADIOACTIVE SEED AND SENTINEL LYMPH NODE BIOPSY Right 11/11/2015   Procedure: RIGHT BREAST RADIOACTIVE SEED GUIDED  LUMPECTOMY AND RADIOACTIVE SEED TARGETED SENTINEL LYMPH NODE BIOPSY;  Surgeon: Stark Klein, MD;  Location: [facility];  Service: General;  Laterality: Right;  . BREAST SURGERY Left    breast biopies x4  . CHOLECYSTECTOMY  09/08/2010  . COLONOSCOPY    . COLONOSCOPY N/A 09/25/2013   Procedure:  COLONOSCOPY;  Surgeon: Rogene Houston, MD;  [facility];  Service: Endoscopy;  Laterality: N/A;  100-moved to 1200 Ann to notify pt  . HERNIA REPAIR  12/24/2010, 12-2014   TOTAL 4  . LAPAROSCOPIC GASTRIC SLEEVE RESECTION  11-2013   at [facility]  2007/2008  . TONSILLECTOMY  1968  . TOTAL KNEE ARTHROPLASTY Left 05/02/2017  . TOTAL KNEE ARTHROPLASTY Left 05/02/2017   Procedure: LEFT TOTAL KNEE ARTHROPLASTY;  Surgeon: [name], MD;  Location: [facility];  Service: Orthopedics;  Laterality: Left;  . UPPER GASTROINTESTINAL ENDOSCOPY    . VAGINAL HYSTERECTOMY  1980s     SOCIAL HISTORY:  Social History   Socioeconomic History  . Marital status: Married    Spouse name: Not on file  . Number of children: 2  . Years of education: Not on file  . Highest education level: Not on file  Occupational History  . Occupation: retired    Fish farm manager: RETIRED    Comment: teacher  Social Needs  . Financial resource strain: Not on file  . Food insecurity:    Worry: Not on file    Inability: Not on file  . Transportation needs:    Medical: Not on file    Non-medical: Not on file  Tobacco Use  . Smoking status: Never Smoker  . Smokeless tobacco: Never Used  Substance and Sexual Activity  . Alcohol use: No  . Drug use: No  . Sexual activity: Yes    Birth control/protection: Surgical    Comment: hyst  Lifestyle  . Physical activity:    Days per week: Not on file    Minutes per session: Not on file  . Stress: Not on file  Relationships  . Social connections:    Talks on phone: Not on file    Gets together: Not on file    Attends religious service: Not on file    Active member of club or organization: Not on file    Attends meetings of clubs or organizations: Not on file    Relationship status: Not on file  . Intimate partner violence:    Fear of current or ex partner: Not on file    Emotionally abused: Not on file    Physically abused: Not  on file    Forced sexual activity: Not on file  Other Topics Concern  . Not on file  Social History Narrative  . Not on file    FAMILY HISTORY:  Family History  Problem Relation Age of Onset  . Colon cancer Father   . Rectal cancer Father   . Prostate cancer Father   . Dementia Mother   . Osteoporosis Mother   . Healthy Daughter   . Healthy Son   . Pancreatic cancer Paternal Grandfather   . Heart disease Paternal Grandmother   .  Heart disease Maternal Grandfather   . Breast cancer Maternal Grandmother   . Scoliosis Sister     CURRENT MEDICATIONS:  Outpatient Encounter Medications as of 11/28/2018  Medication Sig  . anastrozole (ARIMIDEX) 1 MG tablet Take 1 tablet (1 mg total) by mouth daily.  . B Complex Vitamins (B-COMPLEX/B-12 SL) Place 1 tablet under the tongue daily.   . budesonide-formoterol (SYMBICORT) 80-4.5 MCG/ACT inhaler Take 2 puffs first thing in am and then another 2 puffs about 12 hours later.  . budesonide-formoterol (SYMBICORT) 80-4.5 MCG/ACT inhaler Inhale 2 puffs into the lungs 2 (two) times daily.  . Calcium Citrate-Vitamin D (CALCIUM CITRATE CHEWY BITE) 500-500 MG-UNIT CHEW Chew 500 mg by mouth 2 (two) times daily.   . chlorpheniramine-HYDROcodone (TUSSIONEX) 10-8 MG/5ML SUER TAKE 5 MILLILITERS BY ORAL ROUTE EVERY 12 HOURS  . docusate sodium (COLACE) 100 MG capsule Take 100 mg by mouth at bedtime.   [name] esomeprazole (NEXIUM) 20 MG capsule Take 40 mg by mouth daily before breakfast.   . famotidine (PEPCID) 20 MG tablet Take 20 mg by mouth at bedtime.   [name] guaiFENesin (MUCINEX) 600 MG 12 hr tablet Take 600 mg by mouth every other day.  . Iron-Vitamin C (IRON 100/C PO) Take 1 tablet by mouth daily. Celebrate iron + vit C  . losartan-hydrochlorothiazide (HYZAAR) 100-25 MG tablet Take 1 tablet by mouth daily.  . Magnesium 250 MG TABS Take 500 mg by mouth every evening.  [name] MELATONIN PO Take 1 tablet by mouth at bedtime as needed (sleep).  . Menaquinone-7 (VITAMIN  K2) 100 MCG CAPS Take 100 mcg by mouth every evening.   . Multiple Vitamin (MULTIVITAMIN) capsule Take 1 capsule by mouth 2 (two) times daily with a meal. Celebrate Bariatric MVI  . pravastatin (PRAVACHOL) 20 MG tablet Take 20 mg by mouth at bedtime.   . triamcinolone cream (KENALOG) 0.1 %   . [DISCONTINUED] anastrozole (ARIMIDEX) 1 MG tablet Take 1 tablet (1 mg total) by mouth daily.   No facility-administered encounter medications on file as of 11/28/2018.     ALLERGIES:  Allergies  Allergen Reactions  . Horse-Derived Products     Reaction as a child "heart stopped"  . Sulfa Antibiotics Hives  . Tetanus Toxoid Adsorbed Palpitations    As a child     PHYSICAL EXAM:  ECOG Performance status: 1  Vitals:   11/28/18 0951  BP: (!) 141/74  Pulse: (!) 119  Resp: 18  Temp: 98.1 F (36.7 C)  SpO2: 97%   Filed Weights   11/28/18 0951  Weight: 214 lb (97.1 kg)    Physical Exam Constitutional:      Appearance: Normal appearance. She is normal weight.  Cardiovascular:     Rate and Rhythm: Normal rate and regular rhythm.     Heart sounds: Normal heart sounds.  Pulmonary:     Effort: Pulmonary effort is normal.     Breath sounds: Normal breath sounds.  Abdominal:     General: Bowel sounds are normal.     Palpations: Abdomen is soft.  Musculoskeletal: Normal range of motion.  Skin:    General: Skin is warm and dry.  Neurological:     Mental Status: She is alert and oriented to person, place, and time. Mental status is at baseline.  Psychiatric:        Mood and Affect: Mood normal.        Behavior: Behavior normal.        Thought Content: Thought content normal.  Judgment: Judgment normal.      LABORATORY DATA:  I have reviewed the labs as listed.  CBC    Component Value Date/Time   WBC 6.3 11/27/2018 0941   RBC 3.50 (L) 11/27/2018 0941   HGB 12.1 11/27/2018 0941   HCT 36.2 11/27/2018 0941   PLT 208 11/27/2018 0941   MCV 103.4 (H) 11/27/2018 0941   MCH  34.6 (H) 11/27/2018 0941   MCHC 33.4 11/27/2018 0941   RDW 12.8 11/27/2018 0941   LYMPHSABS 1.6 11/27/2018 0941   MONOABS 0.6 11/27/2018 0941   EOSABS 0.4 11/27/2018 0941   BASOSABS 0.1 11/27/2018 0941   CMP Latest Ref Rng & Units 11/27/2018 06/18/2018 05/07/2018  Glucose 70 - 99 mg/dL 93 - 88  BUN 8 - 23 mg/dL 14 - 11  Creatinine 0.44 - 1.00 mg/dL 0.67 0.50 0.67  Sodium 135 - 145 mmol/L 135 - 128(L)  Potassium 3.5 - 5.1 mmol/L 3.9 - 3.5  Chloride 98 - 111 mmol/L 99 - 95(L)  CO2 22 - 32 mmol/L 26 - 25  Calcium 8.9 - 10.3 mg/dL 9.3 - 8.8(L)  Total Protein 6.5 - 8.1 g/dL 7.1 - -  Total Bilirubin 0.3 - 1.2 mg/dL 0.8 - -  Alkaline Phos 38 - 126 U/L 108 - -  AST 15 - 41 U/L 51(H) - -  ALT 0 - 44 U/L 39 - -    I personally performed a face-to-face visit.  All questions were answered to patient's stated satisfaction. Encouraged patient to call with any new concerns or questions before his next visit to the cancer center and we can certain see him sooner, if needed.     ASSESSMENT & PLAN:   Breast cancer of upper-inner quadrant of right female breast ([place]) 1.  Stage I right breast cancer: - Patient had her regular screening mammogram on 10/05/2015 which was BI-RADS Category 0 incomplete. - She had a diagnostic mammogram on 10/20/2015 which was BI-RADS Category 4 suspicious.  It showed a 5 mm right breast mass at the 1 o'clock position. -They set her up for an ultrasound guided biopsy of the mass on 10/27/2015.  It showed invasive ductal carcinoma and was ER/PR 100% positive.  HER-2 negative.  Ki-67 at 3%. - She had a lumpectomy and sentinel node biopsy with Dr. Barry Dienes on 11/11/2015. -She was treated with adjuvant radiation. -Her Oncotype evaluation placed her in the low risk category. - She was placed on Arimidex 5/2017she will continue this for 5 years. - Her last mammogram on 11/27/2018 which showed B RADS category 2 benign follow-up in 1 year. - She will follow-up in 6 months with repeat  labs.  2.  Osteopenia: - She had a bone density test on 10/23/2017 - It showed a T score of -1.4 which is osteopenia. -She is taking calcium and vitamin D daily as recommended. -We will repeat a bone density test with her yearly mammogram in March 2021      Orders placed this encounter:  Orders Placed This Encounter  Procedures  . Lactate dehydrogenase  . CBC with Differential/Platelet  . Comprehensive metabolic panel  . Ferritin  . Iron and TIBC  . Vitamin B12  . VITAMIN D 25 Hydroxy (Vit-D Deficiency, Fractures)  . Folate      Francene Finders, FNP-C [place] [phone]

## 2018-11-28 NOTE — Patient Instructions (Signed)
[place] at [facility] Discharge Instructions  Follow Up in 6 months with repeat labs.   Thank you for choosing [place] at [facility] to provide your oncology and hematology care.  To afford each patient quality time with our provider, please arrive at least 15 minutes before your scheduled appointment time.   If you have a lab appointment with the [facility] please come in thru the  Main Entrance and check in at the main information desk  You need to re-schedule your appointment should you arrive 10 or more minutes late.  We strive to give you quality time with our providers, and arriving late affects you and other patients whose appointments are after yours.  Also, if you no show three or more times for appointments you may be dismissed from the clinic at the providers discretion.     Again, thank you for choosing [facility].  Our hope is that these requests will decrease the amount of time that you wait before being seen by our physicians.       _____________________________________________________________  Should you have questions after your visit to [facility], please contact our office at [phone] between the hours of 8:00 a.m. and 4:30 p.m.  Voicemails left after 4:00 p.m. will not be returned until the following business day.  For prescription refill requests, have your pharmacy contact our office and allow 72 hours.    Cancer Center Support Programs:   > Cancer Support Group  2nd Tuesday of the month 1pm-2pm, Journey Room

## 2018-11-29 ENCOUNTER — Encounter (INDEPENDENT_AMBULATORY_CARE_PROVIDER_SITE_OTHER): Payer: Self-pay | Admitting: Orthopaedic Surgery

## 2018-11-29 ENCOUNTER — Ambulatory Visit (INDEPENDENT_AMBULATORY_CARE_PROVIDER_SITE_OTHER): Payer: Medicare Other | Admitting: Orthopaedic Surgery

## 2018-11-29 ENCOUNTER — Other Ambulatory Visit: Payer: Self-pay

## 2018-11-29 DIAGNOSIS — M25511 Pain in right shoulder: Secondary | ICD-10-CM | POA: Diagnosis not present

## 2018-11-29 DIAGNOSIS — G8929 Other chronic pain: Secondary | ICD-10-CM

## 2018-11-29 MED ORDER — LIDOCAINE HCL 1 % IJ SOLN
2.0000 mL | INTRAMUSCULAR | Status: AC | PRN
  Administered 2018-11-29: 2 mL

## 2018-11-29 MED ORDER — METHYLPREDNISOLONE ACETATE 40 MG/ML IJ SUSP
80.0000 mg | INTRAMUSCULAR | Status: AC | PRN
  Administered 2018-11-29: 80 mg via INTRA_ARTICULAR

## 2018-11-29 MED ORDER — BUPIVACAINE HCL 0.5 % IJ SOLN
2.0000 mL | INTRAMUSCULAR | Status: AC | PRN
  Administered 2018-11-29: 2 mL via INTRA_ARTICULAR

## 2018-11-29 NOTE — Progress Notes (Signed)
Office Visit Note   Patient: Caitlin [name]           Date of Birth: [birth year]           MRN: [id number] Visit Date: 11/29/2018              Requested by: Sharilyn Sites, [address], [place] [address] PCP: Sharilyn Sites, MD   Assessment & Plan: Visit Diagnoses:  1. Chronic right shoulder pain     Plan: Recurrent impingement syndrome right shoulder.  Will reinject subacromial space with cortisone and obtain MRI scan to rule out rotator cuff tear  Follow-Up Instructions: No follow-ups on file.   Orders:  Orders Placed This Encounter  Procedures  . Large Joint Inj: R subacromial bursa  . MR SHOULDER RIGHT WO CONTRAST   No orders of the defined types were placed in this encounter.     Procedures: Large Joint Inj: R subacromial bursa on 11/29/2018 11:12 AM Indications: pain and diagnostic evaluation Details: 25 G 1.5 in needle, anterolateral approach  Arthrogram: No  Medications: 2 mL bupivacaine 0.5 %; 80 mg methylPREDNISolone acetate 40 MG/ML; 2 mL lidocaine 1 % Consent was given by the patient. Immediately prior to procedure a time out was called to verify the correct patient, procedure, equipment, support staff and site/side marked as required. Patient was prepped and draped in the usual sterile fashion.       Clinical Data: No additional findings.   Subjective: Chief Complaint  Patient presents with  . Right Shoulder - Follow-up  Patient presents today for follow up on her right shoulder. She had a cortisone injection 7 months ago. Patient states that it helped until the last month. She said that last weekend her shoulder froze for 4 hours and could not move it. She has a difficult time with movement. She is right hand dominant. She is taking Ibuprofen, but does not notice improvement. She has taken left over hydrocoodone rarely.   HPI  Review of Systems  Constitutional: Negative for fatigue.  HENT: Negative for ear pain.   Eyes: Negative  for pain.  Respiratory: Positive for shortness of breath.   Cardiovascular: Negative for leg swelling.  Gastrointestinal: Negative for constipation and diarrhea.  Endocrine: Negative for cold intolerance and heat intolerance.  Genitourinary: Negative for difficulty urinating.  Musculoskeletal: Negative for joint swelling.  Skin: Negative for rash.  Allergic/Immunologic: Negative for food allergies.  Neurological: Negative for weakness.  Hematological: Does not bruise/bleed easily.  Psychiatric/Behavioral: Negative for sleep disturbance.     Objective: Vital Signs: BP 139/75   Pulse 89   Ht 5\' 3"  (1.6 m)   Wt 210 lb (95.3 kg)   BMI 37.20 kg/m   Physical Exam Constitutional:      Appearance: She is well-developed.  Eyes:     Pupils: Pupils are equal, round, and reactive to light.  Pulmonary:     Effort: Pulmonary effort is normal.  Skin:    General: Skin is warm and dry.  Neurological:     Mental Status: She is alert and oriented to person, place, and time.  Psychiatric:        Behavior: Behavior normal.     Ortho Exam awake alert and oriented x3.  Comfortable sitting.  Positive impingement and empty can testing.  Painful overhead arc of motion.  Able to maintain arm overhead.  No obvious instability or adhesive capsulitis.  Good strength.  Does have some tenderness in the anterior subacromial region  Specialty Comments:  No specialty comments available.  Imaging: No results found.   PMFS History: Patient Active Problem List   Diagnosis Date Noted  . Chronic right shoulder pain 11/29/2018  . Localized swelling of left lower extremity 07/19/2018  . Cellulitis of left ankle 06/15/2018  . Unilateral primary osteoarthritis, left knee 05/02/2017  . S/P total knee replacement using cement, left 05/02/2017  . Primary osteoarthritis of left knee 12/21/2016  . Chondromalacia patellae, left knee 05/26/2016  . Breast cancer of upper-inner quadrant of right female breast  ([place]) 11/04/2015  . GERD (gastroesophageal reflux disease) 09/02/2013  . Obesity 09/02/2013  . Ventral hernia, recurrent 09/02/2013  . Unspecified asthma(493.90) 12/12/2012  . Cough variant asthma 03/27/2012  . Right middle lobe syndrome 03/27/2012   Past Medical History:  Diagnosis Date  . Arthritis   . Asthma   . Breast cancer ([place])   . Breast disorder    cancer  . Breast mass    benign  . Cellulitis   . GERD (gastroesophageal reflux disease)   . Heart murmur    AS CHILD  OUTGREW IT  . History of hiatal hernia   . Hyperlipidemia   . Hypertension   . Impaired fasting glucose   . Rectocele   . Varicose vein of leg     Family History  Problem Relation Age of Onset  . Colon cancer Father   . Rectal cancer Father   . Prostate cancer Father   . Dementia Mother   . Osteoporosis Mother   . Healthy Daughter   . Healthy Son   . Pancreatic cancer Paternal Grandfather   . Heart disease Paternal Grandmother   . Heart disease Maternal Grandfather   . Breast cancer Maternal Grandmother   . Scoliosis Sister     Past Surgical History:  Procedure Laterality Date  . BREAST LUMPECTOMY WITH RADIOACTIVE SEED AND SENTINEL LYMPH NODE BIOPSY Right 11/11/2015   Procedure: RIGHT BREAST RADIOACTIVE SEED GUIDED  LUMPECTOMY AND RADIOACTIVE SEED TARGETED SENTINEL LYMPH NODE BIOPSY;  Surgeon: Stark Klein, MD;  Location: [facility];  Service: General;  Laterality: Right;  . BREAST SURGERY Left    breast biopies x4  . CHOLECYSTECTOMY  09/08/2010  . COLONOSCOPY    . COLONOSCOPY N/A 09/25/2013   Procedure: COLONOSCOPY;  Surgeon: Rogene Houston, MD;  [facility];  Service: Endoscopy;  Laterality: N/A;  100-moved to 1200 Ann to notify pt  . HERNIA REPAIR  12/24/2010, 12-2014   TOTAL 4  . LAPAROSCOPIC GASTRIC SLEEVE RESECTION  11-2013   at [facility]  2007/2008  . TONSILLECTOMY  1968  . TOTAL KNEE ARTHROPLASTY Left 05/02/2017  . TOTAL  KNEE ARTHROPLASTY Left 05/02/2017   Procedure: LEFT TOTAL KNEE ARTHROPLASTY;  Surgeon: [name], MD;  Location: [facility];  Service: Orthopedics;  Laterality: Left;  . UPPER GASTROINTESTINAL ENDOSCOPY    . VAGINAL HYSTERECTOMY  1980s   Social History   Occupational History  . Occupation: retired    Fish farm manager: RETIRED    Comment: teacher  Tobacco Use  . Smoking status: Never Smoker  . Smokeless tobacco: Never Used  Substance and Sexual Activity  . Alcohol use: No  . Drug use: No  . Sexual activity: Yes    Birth control/protection: Surgical    Comment: hyst

## 2018-12-05 ENCOUNTER — Ambulatory Visit (INDEPENDENT_AMBULATORY_CARE_PROVIDER_SITE_OTHER): Payer: Medicare Other | Admitting: Orthopaedic Surgery

## 2018-12-06 ENCOUNTER — Telehealth: Payer: Self-pay | Admitting: Orthopaedic Surgery

## 2018-12-06 NOTE — Telephone Encounter (Signed)
Patient called checking on the status of her referral for MRI sent to [facility].  Patient requested a return call.

## 2018-12-06 NOTE — Telephone Encounter (Signed)
Spoke with patient. She is aware there may be a delay in the MRI due to the coronavirus.

## 2018-12-10 DIAGNOSIS — I890 Lymphedema, not elsewhere classified: Secondary | ICD-10-CM | POA: Diagnosis not present

## 2018-12-10 DIAGNOSIS — C50211 Malignant neoplasm of upper-inner quadrant of right female breast: Secondary | ICD-10-CM | POA: Diagnosis not present

## 2018-12-11 ENCOUNTER — Telehealth: Payer: Self-pay

## 2018-12-11 ENCOUNTER — Telehealth (HOSPITAL_COMMUNITY): Payer: Self-pay | Admitting: Rehabilitation

## 2018-12-11 ENCOUNTER — Other Ambulatory Visit: Payer: Self-pay | Admitting: *Deleted

## 2018-12-11 DIAGNOSIS — I83813 Varicose veins of bilateral lower extremities with pain: Secondary | ICD-10-CM

## 2018-12-11 NOTE — Telephone Encounter (Signed)
Pt called and said that she saw Dr Barry Dienes yesterday at [facility] and noticed a know on her left thigh. Recommended she call here and get an appt to be seen.   She said that the area is bruised some and red and questions if it is a surface clot.   Appt made for pt to be seen tomorrow with Dr Scot Dock to evaluate.   York Cerise, CMA

## 2018-12-11 NOTE — Telephone Encounter (Signed)
The above patient or their representative was contacted and gave the following answers to these questions:         Do you have any of the following symptoms? No  Fever                    Cough                   Shortness of breath  Do  you have any of the following other symptoms? No   muscle pain         vomiting,        diarrhea        rash         weakness        red eye        abdominal pain         bruising          bruising or bleeding              joint pain           severe headache    Have you been in contact with someone who was or has been sick in the past 2 weeks? No  Yes                 Unsure                         Unable to assess   Does the person that you were in contact with have any of the following symptoms?   Cough         shortness of breath           muscle pain         vomiting,            diarrhea            rash            weakness           fever            red eye           abdominal pain           bruising  or  bleeding                joint pain                severe headache               Have you  or someone you have been in contact with traveled internationally in th last month? No        If yes, which countries?   Have you  or someone you have been in contact with traveled outside [place] in th last month? No         If yes, which state and city?   COMMENTS OR ACTION PLAN FOR THIS PATIENT:          

## 2018-12-12 ENCOUNTER — Ambulatory Visit (INDEPENDENT_AMBULATORY_CARE_PROVIDER_SITE_OTHER): Payer: Medicare Other | Admitting: Vascular Surgery

## 2018-12-12 ENCOUNTER — Other Ambulatory Visit: Payer: Self-pay

## 2018-12-12 ENCOUNTER — Encounter: Payer: Self-pay | Admitting: Vascular Surgery

## 2018-12-12 ENCOUNTER — Ambulatory Visit: Payer: Medicare Other | Admitting: Vascular Surgery

## 2018-12-12 ENCOUNTER — Ambulatory Visit (HOSPITAL_COMMUNITY)
Admission: RE | Admit: 2018-12-12 | Discharge: 2018-12-12 | Disposition: A | Discharge status: Home / Self Care | Payer: Medicare Other | Source: Ambulatory Visit | Attending: Vascular Surgery | Admitting: Vascular Surgery

## 2018-12-12 VITALS — BP 128/82 | HR 92 | Temp 98.0°F | Resp 20 | Ht 63.0 in | Wt 213.0 lb

## 2018-12-12 DIAGNOSIS — I8003 Phlebitis and thrombophlebitis of superficial vessels of lower extremities, bilateral: Secondary | ICD-10-CM

## 2018-12-12 DIAGNOSIS — I83813 Varicose veins of bilateral lower extremities with pain: Secondary | ICD-10-CM | POA: Diagnosis not present

## 2018-12-12 NOTE — Progress Notes (Signed)
Patient name: Caitlin [name] MRN: [id number] DOB: October 04, 1945 Sex: female  REASON FOR VISIT:   Knot in left thigh.  The consult is requested by Dr. Barry Dienes.  HPI:   Caitlin [name] is a pleasant 73 y.o. female who I last saw on 09/27/2018.  The patient has a complicated history.  She is undergone previous laser ablation of both great saphenous veins.  However she has had recurrent bleeding episodes from both ankles and also an ulcer on her left medial malleolus which ultimately healed.  She has been trying conservative measures to help alleviate her symptoms in both legs.  Her duplex scan at the time of her last visit showed a fairly long segment of the left great saphenous vein in the distal thigh and around the knee that was incompetent.  This needed a large cluster of varicose veins in the medial left calf.  She also had a large amount of spider veins around the ankle on the left.  Given her repeated episodes of bleeding and also a history of a venous ulcer I felt that she was a good candidate for laser ablation of the distal saphenous vein on the left.  If this did not adequately decompress the large cluster of varicose veins in her medial calf she would require staged stab phlebectomies (10-20 stabs).  She would then ultimately require sclerotherapy to address the issue with bleeding but this could not be done until the proximal source of venous hypertension was addressed.  I believe the procedure was to be scheduled in the near future but because of the COVID-19 situation this has been delayed.  Patient presents today with a 2-week history of some pain along the medial thigh and calf bilaterally.  She has not had any recent travel.  She denies any injury to this area.  She did have a routine follow-up visit with Dr. Barry Dienes and noted the symptoms and was referred for vascular consultation.  She denies any chest pain or shortness of breath.  She is had mild chronic bilateral lower extremity swelling.   She is had no bleeding episodes from her spider veins in her feet.  Past Medical History:  Diagnosis Date  . Arthritis   . Asthma   . Breast cancer ([place])   . Breast disorder    cancer  . Breast mass    benign  . Cellulitis   . GERD (gastroesophageal reflux disease)   . Heart murmur    AS CHILD  OUTGREW IT  . History of hiatal hernia   . Hyperlipidemia   . Hypertension   . Impaired fasting glucose   . Rectocele   . Varicose vein of leg     Family History  Problem Relation Age of Onset  . Colon cancer Father   . Rectal cancer Father   . Prostate cancer Father   . Dementia Mother   . Osteoporosis Mother   . Healthy Daughter   . Healthy Son   . Pancreatic cancer Paternal Grandfather   . Heart disease Paternal Grandmother   . Heart disease Maternal Grandfather   . Breast cancer Maternal Grandmother   . Scoliosis Sister     SOCIAL HISTORY: Social History   Tobacco Use  . Smoking status: Never Smoker  . Smokeless tobacco: Never Used  Substance Use Topics  . Alcohol use: No    Allergies  Allergen Reactions  . Horse-Derived Products     Reaction as a child "heart stopped"  . Sulfa Antibiotics  Hives  . Tetanus Toxoid Adsorbed Palpitations    As a child    Current Outpatient Medications  Medication Sig Dispense Refill  . anastrozole (ARIMIDEX) 1 MG tablet Take 1 tablet (1 mg total) by mouth daily. 90 tablet 4  . B Complex Vitamins (B-COMPLEX/B-12 SL) Place 1 tablet under the tongue daily.     . budesonide-formoterol (SYMBICORT) 80-4.5 MCG/ACT inhaler Take 2 puffs first thing in am and then another 2 puffs about 12 hours later. 1 Inhaler 11  . budesonide-formoterol (SYMBICORT) 80-4.5 MCG/ACT inhaler Inhale 2 puffs into the lungs 2 (two) times daily. 1 Inhaler 0  . Calcium Citrate-Vitamin D (CALCIUM CITRATE CHEWY BITE) 500-500 MG-UNIT CHEW Chew 500 mg by mouth 2 (two) times daily.     [name] docusate sodium (COLACE) 100 MG capsule Take 100 mg by mouth at bedtime.     [name]  esomeprazole (NEXIUM) 20 MG capsule Take 40 mg by mouth daily before breakfast.     . famotidine (PEPCID) 20 MG tablet Take 20 mg by mouth at bedtime.     [name] guaiFENesin (MUCINEX) 600 MG 12 hr tablet Take 600 mg by mouth every other day.    . Iron-Vitamin C (IRON 100/C PO) Take 1 tablet by mouth daily. Celebrate iron + vit C    . losartan-hydrochlorothiazide (HYZAAR) 100-25 MG tablet Take 1 tablet by mouth daily.  1  . Magnesium 250 MG TABS Take 500 mg by mouth every evening.    [name] MELATONIN PO Take 1 tablet by mouth at bedtime as needed (sleep).    . Menaquinone-7 (VITAMIN K2) 100 MCG CAPS Take 100 mcg by mouth every evening.     . Multiple Vitamin (MULTIVITAMIN) capsule Take 1 capsule by mouth 2 (two) times daily with a meal. Celebrate Bariatric MVI    . pravastatin (PRAVACHOL) 20 MG tablet Take 20 mg by mouth at bedtime.     . triamcinolone cream (KENALOG) 0.1 %      No current facility-administered medications for this visit.     REVIEW OF SYSTEMS:  [X]  denotes positive finding, [ ]  denotes negative finding Cardiac  Comments:  Chest pain or chest pressure:    Shortness of breath upon exertion:    Short of breath when lying flat:    Irregular heart rhythm:        Vascular    Pain in calf, thigh, or hip brought on by ambulation:    Pain in feet at night that wakes you up from your sleep:     Blood clot in your veins:    Leg swelling:         Pulmonary    Oxygen at home:    Productive cough:     Wheezing:         Neurologic    Sudden weakness in arms or legs:     Sudden numbness in arms or legs:     Sudden onset of difficulty speaking or slurred speech:    Temporary loss of vision in one eye:     Problems with dizziness:         Gastrointestinal    Blood in stool:     Vomited blood:         Genitourinary    Burning when urinating:     Blood in urine:        Psychiatric    Major depression:         Hematologic    Bleeding problems:  Problems with blood clotting  too easily:        Skin    Rashes or ulcers:        Constitutional    Fever or chills:     PHYSICAL EXAM:   Vitals:   12/12/18 1100  BP: 128/82  Pulse: 92  Resp: 20  Temp: 98 F (36.7 C)  SpO2: 95%  Weight: 213 lb (96.6 kg)  Height: 5\' 3"  (1.6 m)    GENERAL: The patient is a well-nourished female, in no acute distress. The vital signs are documented above. CARDIAC: There is a regular rate and rhythm.  VASCULAR: I do not detect carotid bruits. She has palpable femoral and pedal pulses bilaterally. She has large clusters of varicose veins in her thigh and calf bilaterally and does have evidence of induration to suggest underlying phlebitis. PULMONARY: There is good air exchange bilaterally without wheezing or rales. ABDOMEN: Soft and non-tender with normal pitched bowel sounds.  MUSCULOSKELETAL: There are no major deformities or cyanosis. NEUROLOGIC: No focal weakness or paresthesias are detected. SKIN: There are no ulcers or rashes noted. PSYCHIATRIC: The patient has a normal affect.  DATA:    VENOUS DUPLEX: I have independently interpreted her venous duplex scan today.  On the left side, the patient has reflux in the common femoral vein and also at the saphenofemoral junction and in an accessory anterior saphenous vein.  The patient was noted to have acute superficial vein thrombosis involving varicosities on the left from the mid thigh to the mid calf.  With no evidence of deep venous thrombosis.  On the right side, there was deep venous reflux involving the common femoral vein.  There was also reflux at the saphenofemoral junction and in an anterior accessory saphenous vein.  The anterior sensory saphenous vein on the right was dilated.  There was evidence of acute superficial venous thrombosis involving varicose veins from the mid thigh to the mid calf on the right.  There was no evidence of DVT.  MEDICAL ISSUES:   BILATERAL SUPERFICIAL THROMBOPHLEBITIS: Based on her  exam and her duplex study she does have a superficial thrombophlebitis involving varicose veins in her medial thighs and calves bilaterally.  Currently she does not have a lot of inflammation or pain.  We have discussed the importance of intermittent leg elevation of the proper positioning for this.  I encouraged her to use warm compresses as needed and ibuprofen as needed for pain.  She does not really tolerate compression stockings.  I will plan on seeing her back in 6 months.  We did consider doing laser ablation of an accessory saphenous vein with stab phlebectomies but certainly would not consider this given her active phlebitis.  I will look at the veins myself when she returns in 6 months.  The other consideration would be sclerotherapy in her feet if she had any further bleeding problems.  She knows to call sooner if she has problems.  [name] [facility] [phone]

## 2018-12-25 ENCOUNTER — Other Ambulatory Visit: Payer: Self-pay

## 2018-12-25 ENCOUNTER — Ambulatory Visit (HOSPITAL_COMMUNITY)
Admission: RE | Admit: 2018-12-25 | Discharge: 2018-12-25 | Disposition: A | Discharge status: Home / Self Care | Payer: Medicare Other | Source: Ambulatory Visit | Attending: Orthopaedic Surgery | Admitting: Orthopaedic Surgery

## 2018-12-25 DIAGNOSIS — M25511 Pain in right shoulder: Secondary | ICD-10-CM | POA: Insufficient documentation

## 2018-12-25 DIAGNOSIS — G8929 Other chronic pain: Secondary | ICD-10-CM | POA: Insufficient documentation

## 2018-12-26 ENCOUNTER — Ambulatory Visit (INDEPENDENT_AMBULATORY_CARE_PROVIDER_SITE_OTHER): Payer: Medicare Other | Admitting: Orthopaedic Surgery

## 2018-12-26 ENCOUNTER — Encounter: Payer: Self-pay | Admitting: Orthopaedic Surgery

## 2018-12-26 VITALS — Ht 63.0 in | Wt 205.0 lb

## 2018-12-26 DIAGNOSIS — G8929 Other chronic pain: Secondary | ICD-10-CM | POA: Diagnosis not present

## 2018-12-26 DIAGNOSIS — M25511 Pain in right shoulder: Secondary | ICD-10-CM | POA: Diagnosis not present

## 2018-12-26 NOTE — Progress Notes (Signed)
Office Visit Note   Patient: Caitlin [name]           Date of Birth: [birth year]           MRN: [id number] Visit Date: 12/26/2018              Requested by: Sharilyn Sites, [address] [address], [place] [address] PCP: Sharilyn Sites, MD   Assessment & Plan: Visit Diagnoses:  1. Chronic right shoulder pain     Plan: MRI scan of right shoulder demonstrates moderate glenohumeral joint effusion with severe degenerative arthropathy associated with full-thickness loss of articular cartilage considerable subcortical marrow edema.  There are small subcortical cysts and marginal spurring.  There was some fluid in the subacromial space most likely related to the cortisone the rotator cuff appeared to be intact without evidence of tearing however there was spinatus tendinopathy.  [name] is feeling much better.  Long discussion regarding MRI scan findings and what she expect over time.  We will try over-the-counter medicines as needed and a course of physical therapy at [facility] can first shoulder strengthening.  Back as needed.  Consider repeat cortisone injection over time.  Discussed definitive treatment of shoulder replacement.    Follow-Up Instructions: Return if symptoms worsen or fail to improve.   Orders:  Orders Placed This Encounter  Procedures  . Ambulatory referral to Physical Therapy   No orders of the defined types were placed in this encounter.     Procedures: No procedures performed   Clinical Data: No additional findings.   Subjective: Chief Complaint  Patient presents with  . Right Shoulder - Follow-up  Patient presents today for a one month follow up for her right shoulder. She had a subacromial cortisone injection on 11/29/18 and states that it has helped. She also had an MRI yesterday and is here today to go over those results. She takes Advil as needed.  No related loss of function of her right shoulder.  HPI  Review of Systems   Objective: Vital  Signs: Ht 5\' 3"  (1.6 m)   Wt 205 lb (93 kg)   BMI 36.31 kg/m   Physical Exam Constitutional:      Appearance: She is well-developed.  Eyes:     Pupils: Pupils are equal, round, and reactive to light.  Pulmonary:     Effort: Pulmonary effort is normal.  Skin:    General: Skin is warm and dry.  Neurological:     Mental Status: She is alert and oriented to person, place, and time.  Psychiatric:        Behavior: Behavior normal.     Ortho Exam awake alert and oriented x3.  Comfortable sitting.  Actually had excellent range of motion of her right shoulder with a little pain on extreme of external rotation in the impingement position.  Full quick overhead motion.  Good strength and good grip.  Some mild pain along the anterior glenohumeral joint.  Skin intact.  Biceps intact.  No pain at the acromioclavicular joint  Specialty Comments:  No specialty comments available.  Imaging: Mr Shoulder Right Wo Contrast  Result Date: 12/25/2018 CLINICAL DATA:  Right shoulder pain.  Rotator cuff tear. EXAM: MRI OF THE RIGHT SHOULDER WITHOUT CONTRAST TECHNIQUE: Multiplanar, multisequence MR imaging of the shoulder was performed. No intravenous contrast was administered. COMPARISON:  None. FINDINGS: Despite efforts by the technologist and patient, motion artifact is present on today's exam and could not be eliminated. This reduces exam sensitivity and  specificity. Rotator cuff: Moderate to advanced anterior supraspinatus tendinopathy, without appreciable rotator cuff tear. Muscles:  Generalized regional muscular atrophy. Biceps long head:  Mild tendinopathy of the intra-articular segment. Acromioclavicular Joint: The distal clavicle is slightly inferiorly positioned with respect to the acromion, leading to some supraspinatus impingement. Possibility of a prior AC joint injury is raised given this poor alignment. Along the upper margin of the [facility] joint, a 1.4 by 1.3 by 2.3 cm fluid collection is observed. Type  II acromion. This abnormal fluid in the subacromial subdeltoid bursa extending into the subcoracoid bursa. Glenohumeral Joint: Moderate glenohumeral joint effusion. Severe glenohumeral degenerative arthropathy with full-thickness loss of articular cartilage, considerable subcortical marrow edema, small subcortical cysts, and marginal spurring. Labrum:  No well-defined tear. Bones:  No additional significant bony findings. Other: No supplemental non-categorized findings. IMPRESSION: 1. Moderate to advanced anterior supraspinatus tendinopathy, likely due to impingement from the distal clavicle. 2. Low position of the distal clavicle with respect to the acromion, query prior [facility] joint injury. There is a fluid collection extending from the superior margin of the [facility] joint as noted above. 3. Subacromial subdeltoid bursitis and subcoracoid bursitis. 4. Moderate glenohumeral joint effusion with severe degenerative arthropathy of the glenohumeral joint. Electronically Signed   By: Van Clines M.D.   On: 12/25/2018 16:03     PMFS History: Patient Active Problem List   Diagnosis Date Noted  . Chronic right shoulder pain 11/29/2018  . Localized swelling of left lower extremity 07/19/2018  . Cellulitis of left ankle 06/15/2018  . Unilateral primary osteoarthritis, left knee 05/02/2017  . S/P total knee replacement using cement, left 05/02/2017  . Primary osteoarthritis of left knee 12/21/2016  . Chondromalacia patellae, left knee 05/26/2016  . Breast cancer of upper-inner quadrant of right female breast ([place]) 11/04/2015  . GERD (gastroesophageal reflux disease) 09/02/2013  . Obesity 09/02/2013  . Ventral hernia, recurrent 09/02/2013  . Unspecified asthma(493.90) 12/12/2012  . Cough variant asthma 03/27/2012  . Right middle lobe syndrome 03/27/2012   Past Medical History:  Diagnosis Date  . Arthritis   . Asthma   . Breast cancer ([place])   . Breast disorder    cancer  . Breast mass    benign  .  Cellulitis   . GERD (gastroesophageal reflux disease)   . Heart murmur    AS CHILD  OUTGREW IT  . History of hiatal hernia   . Hyperlipidemia   . Hypertension   . Impaired fasting glucose   . Rectocele   . Varicose vein of leg     Family History  Problem Relation Age of Onset  . Colon cancer Father   . Rectal cancer Father   . Prostate cancer Father   . Dementia Mother   . Osteoporosis Mother   . Healthy Daughter   . Healthy Son   . Pancreatic cancer Paternal Grandfather   . Heart disease Paternal Grandmother   . Heart disease Maternal Grandfather   . Breast cancer Maternal Grandmother   . Scoliosis Sister     Past Surgical History:  Procedure Laterality Date  . BREAST LUMPECTOMY WITH RADIOACTIVE SEED AND SENTINEL LYMPH NODE BIOPSY Right 11/11/2015   Procedure: RIGHT BREAST RADIOACTIVE SEED GUIDED  LUMPECTOMY AND RADIOACTIVE SEED TARGETED SENTINEL LYMPH NODE BIOPSY;  Surgeon: Stark Klein, MD;  Location: [place];  Service: General;  Laterality: Right;  . BREAST SURGERY Left    breast biopies x4  . CHOLECYSTECTOMY  09/08/2010  . COLONOSCOPY    .  COLONOSCOPY N/A 09/25/2013   Procedure: COLONOSCOPY;  Surgeon: Rogene Houston, MD;  [facility];  Service: Endoscopy;  Laterality: N/A;  100-moved to 1200 Ann to notify pt  . HERNIA REPAIR  12/24/2010, 12-2014   TOTAL 4  . LAPAROSCOPIC GASTRIC SLEEVE RESECTION  11-2013   at [place]  2007/2008  . TONSILLECTOMY  1968  . TOTAL KNEE ARTHROPLASTY Left 05/02/2017  . TOTAL KNEE ARTHROPLASTY Left 05/02/2017   Procedure: LEFT TOTAL KNEE ARTHROPLASTY;  Surgeon: [name], MD;  Location: [place];  Service: Orthopedics;  Laterality: Left;  . UPPER GASTROINTESTINAL ENDOSCOPY    . VAGINAL HYSTERECTOMY  1980s   Social History   Occupational History  . Occupation: retired    Fish farm manager: RETIRED    Comment: teacher  Tobacco Use  . Smoking status: Never Smoker  . Smokeless  tobacco: Never Used  Substance and Sexual Activity  . Alcohol use: No  . Drug use: No  . Sexual activity: Yes    Birth control/protection: Surgical    Comment: hyst

## 2018-12-31 DIAGNOSIS — M19011 Primary osteoarthritis, right shoulder: Secondary | ICD-10-CM | POA: Diagnosis not present

## 2019-01-09 DIAGNOSIS — M19011 Primary osteoarthritis, right shoulder: Secondary | ICD-10-CM | POA: Diagnosis not present

## 2019-01-14 DIAGNOSIS — J069 Acute upper respiratory infection, unspecified: Secondary | ICD-10-CM | POA: Diagnosis not present

## 2019-01-14 DIAGNOSIS — N390 Urinary tract infection, site not specified: Secondary | ICD-10-CM | POA: Diagnosis not present

## 2019-01-14 DIAGNOSIS — M19011 Primary osteoarthritis, right shoulder: Secondary | ICD-10-CM | POA: Diagnosis not present

## 2019-01-23 DIAGNOSIS — M19011 Primary osteoarthritis, right shoulder: Secondary | ICD-10-CM | POA: Diagnosis not present

## 2019-02-06 ENCOUNTER — Telehealth: Payer: Self-pay | Admitting: Orthopaedic Surgery

## 2019-02-06 NOTE — Telephone Encounter (Signed)
Patient calling to see if she needs antibiotics before she has any type of dental work done. Her dentist was asking, and she wanted to find out for future appointments. Please call to advise.

## 2019-02-06 NOTE — Telephone Encounter (Signed)
No VM  

## 2019-02-07 NOTE — Telephone Encounter (Signed)
I called patient and advised to take antibiotics before dental work.

## 2019-02-15 DIAGNOSIS — N390 Urinary tract infection, site not specified: Secondary | ICD-10-CM | POA: Diagnosis not present

## 2019-02-18 ENCOUNTER — Other Ambulatory Visit: Payer: Medicare Other

## 2019-02-18 DIAGNOSIS — Z20822 Contact with and (suspected) exposure to covid-19: Secondary | ICD-10-CM

## 2019-02-18 DIAGNOSIS — N342 Other urethritis: Secondary | ICD-10-CM | POA: Diagnosis not present

## 2019-02-18 DIAGNOSIS — N390 Urinary tract infection, site not specified: Secondary | ICD-10-CM | POA: Diagnosis not present

## 2019-02-18 DIAGNOSIS — R6889 Other general symptoms and signs: Secondary | ICD-10-CM | POA: Diagnosis not present

## 2019-02-20 DIAGNOSIS — N390 Urinary tract infection, site not specified: Secondary | ICD-10-CM | POA: Diagnosis not present

## 2019-02-20 DIAGNOSIS — R350 Frequency of micturition: Secondary | ICD-10-CM | POA: Diagnosis not present

## 2019-02-21 LAB — NOVEL CORONAVIRUS, NAA: SARS-CoV-2, NAA: NOT DETECTED

## 2019-02-25 DIAGNOSIS — R350 Frequency of micturition: Secondary | ICD-10-CM | POA: Diagnosis not present

## 2019-02-25 DIAGNOSIS — N3020 Other chronic cystitis without hematuria: Secondary | ICD-10-CM | POA: Diagnosis not present

## 2019-02-25 DIAGNOSIS — R351 Nocturia: Secondary | ICD-10-CM | POA: Diagnosis not present

## 2019-02-25 DIAGNOSIS — N390 Urinary tract infection, site not specified: Secondary | ICD-10-CM | POA: Diagnosis not present

## 2019-02-25 DIAGNOSIS — R8279 Other abnormal findings on microbiological examination of urine: Secondary | ICD-10-CM | POA: Diagnosis not present

## 2019-04-03 ENCOUNTER — Encounter: Payer: Self-pay | Admitting: Orthopaedic Surgery

## 2019-04-03 ENCOUNTER — Other Ambulatory Visit: Payer: Self-pay

## 2019-04-03 ENCOUNTER — Ambulatory Visit (INDEPENDENT_AMBULATORY_CARE_PROVIDER_SITE_OTHER): Payer: Medicare Other | Admitting: Orthopaedic Surgery

## 2019-04-03 VITALS — BP 139/80 | HR 85 | Ht 63.0 in | Wt 205.0 lb

## 2019-04-03 DIAGNOSIS — M25511 Pain in right shoulder: Secondary | ICD-10-CM | POA: Diagnosis not present

## 2019-04-03 DIAGNOSIS — G8929 Other chronic pain: Secondary | ICD-10-CM | POA: Diagnosis not present

## 2019-04-03 MED ORDER — METHYLPREDNISOLONE ACETATE 40 MG/ML IJ SUSP
80.0000 mg | INTRAMUSCULAR | Status: AC | PRN
  Administered 2019-04-03: 80 mg via INTRA_ARTICULAR

## 2019-04-03 MED ORDER — BUPIVACAINE HCL 0.5 % IJ SOLN
2.0000 mL | INTRAMUSCULAR | Status: AC | PRN
  Administered 2019-04-03: 2 mL via INTRA_ARTICULAR

## 2019-04-03 MED ORDER — LIDOCAINE HCL 1 % IJ SOLN
2.0000 mL | INTRAMUSCULAR | Status: AC | PRN
  Administered 2019-04-03: 2 mL

## 2019-04-03 NOTE — Progress Notes (Signed)
Office Visit Note   Patient: Caitlin [name]           Date of Birth: 20-Oct-1945           MRN: [id number] Visit Date: 04/03/2019              Requested by: Sharilyn Sites, [address],  [place] [address] PCP: Sharilyn Sites, MD   Assessment & Plan: Visit Diagnoses:  1. Chronic right shoulder pain     Plan: Recurrent symptoms of osteoarthritis right shoulder.  Will inject glenohumeral joint with cortisone.  Has had prior MRI scan demonstrating significant degenerative change with an intact rotator cuff.  Caitlin [name] has preferred not to pursue surgery at this point. last injection was about 5 months ago  Follow-Up Instructions: Return if symptoms worsen or fail to improve.   Orders:  Orders Placed This Encounter  Procedures  . Large Joint Inj: R glenohumeral   No orders of the defined types were placed in this encounter.     Procedures: Large Joint Inj: R glenohumeral on 04/03/2019 11:06 AM Indications: pain and diagnostic evaluation Details: 25 G 1.5 in needle, anterior approach  Arthrogram: No  Medications: 2 mL lidocaine 1 %; 2 mL bupivacaine 0.5 %; 80 mg methylPREDNISolone acetate 40 MG/ML Consent was given by the patient. Immediately prior to procedure a time out was called to verify the correct patient, procedure, equipment, support staff and site/side marked as required. Patient was prepped and draped in the usual sterile fashion.       Clinical Data: No additional findings.   Subjective: Chief Complaint  Patient presents with  . Right Shoulder - Follow-up  Patient presents today for recurrent right shoulder pain. She had a cortisone injection on 11/29/2018. She had good relief from the injection, and is wanting to get another injection today.  Has primary osteoarthritis of her right shoulder by plain film and MRI scan with an intact rotator cuff  HPI  Review of Systems   Objective: Vital Signs: BP 139/80   Pulse 85   Ht 5\' 3"  (1.6 m)    Wt 205 lb (93 kg)   BMI 36.31 kg/m   Physical Exam Constitutional:      Appearance: She is well-developed.  Eyes:     Pupils: Pupils are equal, round, and reactive to light.  Pulmonary:     Effort: Pulmonary effort is normal.  Skin:    General: Skin is warm and dry.  Neurological:     Mental Status: She is alert and oriented to person, place, and time.  Psychiatric:        Behavior: Behavior normal.     Ortho Exam painful range of motion of right shoulder with some crepitation with internal and external rotation extremes.  Able to place her arm over her head but with a circuitous arc of motion.  Some loss of external rotation.   also some loss of full shoulder flexion.  Good grip and good release.  Skin intact.  No localized areas of tenderness  Specialty Comments:  No specialty comments available.  Imaging: No results found.   PMFS History: Patient Active Problem List   Diagnosis Date Noted  . Chronic right shoulder pain 11/29/2018  . Localized swelling of left lower extremity 07/19/2018  . Cellulitis of left ankle 06/15/2018  . Unilateral primary osteoarthritis, left knee 05/02/2017  . S/P total knee replacement using cement, left 05/02/2017  . Primary osteoarthritis of left knee 12/21/2016  .  Chondromalacia patellae, left knee 05/26/2016  . Breast cancer of upper-inner quadrant of right female breast ([place]) 11/04/2015  . GERD (gastroesophageal reflux disease) 09/02/2013  . Obesity 09/02/2013  . Ventral hernia, recurrent 09/02/2013  . Unspecified asthma(493.90) 12/12/2012  . Cough variant asthma 03/27/2012  . Right middle lobe syndrome 03/27/2012   Past Medical History:  Diagnosis Date  . Arthritis   . Asthma   . Breast cancer ([place])   . Breast disorder    cancer  . Breast mass    benign  . Cellulitis   . GERD (gastroesophageal reflux disease)   . Heart murmur    AS CHILD  OUTGREW IT  . History of hiatal hernia   . Hyperlipidemia   . Hypertension   .  Impaired fasting glucose   . Rectocele   . Varicose vein of leg     Family History  Problem Relation Age of Onset  . Colon cancer Father   . Rectal cancer Father   . Prostate cancer Father   . Dementia Mother   . Osteoporosis Mother   . Healthy Daughter   . Healthy Son   . Pancreatic cancer Paternal Grandfather   . Heart disease Paternal Grandmother   . Heart disease Maternal Grandfather   . Breast cancer Maternal Grandmother   . Scoliosis Sister     Past Surgical History:  Procedure Laterality Date  . BREAST LUMPECTOMY WITH RADIOACTIVE SEED AND SENTINEL LYMPH NODE BIOPSY Right 11/11/2015   Procedure: RIGHT BREAST RADIOACTIVE SEED GUIDED  LUMPECTOMY AND RADIOACTIVE SEED TARGETED SENTINEL LYMPH NODE BIOPSY;  Surgeon: Stark Klein, MD;  Location: [place];  Service: General;  Laterality: Right;  . BREAST SURGERY Left    breast biopies x4  . CHOLECYSTECTOMY  09/08/2010  . COLONOSCOPY    . COLONOSCOPY N/A 09/25/2013   Procedure: COLONOSCOPY;  Surgeon: Rogene Houston, MD;  [facility];  Service: Endoscopy;  Laterality: N/A;  100-moved to 1200 Ann to notify pt  . HERNIA REPAIR  12/24/2010, 12-2014   TOTAL 4  . LAPAROSCOPIC GASTRIC SLEEVE RESECTION  11-2013   at [place]  2007/2008  . TONSILLECTOMY  1968  . TOTAL KNEE ARTHROPLASTY Left 05/02/2017  . TOTAL KNEE ARTHROPLASTY Left 05/02/2017   Procedure: LEFT TOTAL KNEE ARTHROPLASTY;  Surgeon: [name], MD;  Location: [place];  Service: Orthopedics;  Laterality: Left;  . UPPER GASTROINTESTINAL ENDOSCOPY    . VAGINAL HYSTERECTOMY  1980s   Social History   Occupational History  . Occupation: retired    Fish farm manager: RETIRED    Comment: teacher  Tobacco Use  . Smoking status: Never Smoker  . Smokeless tobacco: Never Used  Substance and Sexual Activity  . Alcohol use: No  . Drug use: No  . Sexual activity: Yes    Birth control/protection: Surgical    Comment: hyst

## 2019-04-04 DIAGNOSIS — R300 Dysuria: Secondary | ICD-10-CM | POA: Diagnosis not present

## 2019-04-15 DIAGNOSIS — S20211A Contusion of right front wall of thorax, initial encounter: Secondary | ICD-10-CM | POA: Diagnosis not present

## 2019-04-15 DIAGNOSIS — S5001XA Contusion of right elbow, initial encounter: Secondary | ICD-10-CM | POA: Diagnosis not present

## 2019-04-25 DIAGNOSIS — Z4889 Encounter for other specified surgical aftercare: Secondary | ICD-10-CM | POA: Diagnosis not present

## 2019-04-25 DIAGNOSIS — Z09 Encounter for follow-up examination after completed treatment for conditions other than malignant neoplasm: Secondary | ICD-10-CM | POA: Diagnosis not present

## 2019-04-25 DIAGNOSIS — Z8719 Personal history of other diseases of the digestive system: Secondary | ICD-10-CM | POA: Diagnosis not present

## 2019-04-26 DIAGNOSIS — R350 Frequency of micturition: Secondary | ICD-10-CM | POA: Diagnosis not present

## 2019-04-26 DIAGNOSIS — N3020 Other chronic cystitis without hematuria: Secondary | ICD-10-CM | POA: Diagnosis not present

## 2019-05-01 DIAGNOSIS — E7849 Other hyperlipidemia: Secondary | ICD-10-CM | POA: Diagnosis not present

## 2019-05-01 DIAGNOSIS — R945 Abnormal results of liver function studies: Secondary | ICD-10-CM | POA: Diagnosis not present

## 2019-05-01 DIAGNOSIS — I10 Essential (primary) hypertension: Secondary | ICD-10-CM | POA: Diagnosis not present

## 2019-05-07 DIAGNOSIS — Z6836 Body mass index (BMI) 36.0-36.9, adult: Secondary | ICD-10-CM | POA: Diagnosis not present

## 2019-05-07 DIAGNOSIS — I872 Venous insufficiency (chronic) (peripheral): Secondary | ICD-10-CM | POA: Diagnosis not present

## 2019-05-07 DIAGNOSIS — Z0001 Encounter for general adult medical examination with abnormal findings: Secondary | ICD-10-CM | POA: Diagnosis not present

## 2019-05-07 DIAGNOSIS — E7849 Other hyperlipidemia: Secondary | ICD-10-CM | POA: Diagnosis not present

## 2019-05-07 DIAGNOSIS — I10 Essential (primary) hypertension: Secondary | ICD-10-CM | POA: Diagnosis not present

## 2019-05-07 DIAGNOSIS — Z23 Encounter for immunization: Secondary | ICD-10-CM | POA: Diagnosis not present

## 2019-05-07 DIAGNOSIS — Z1389 Encounter for screening for other disorder: Secondary | ICD-10-CM | POA: Diagnosis not present

## 2019-05-07 DIAGNOSIS — I493 Ventricular premature depolarization: Secondary | ICD-10-CM | POA: Diagnosis not present

## 2019-05-07 DIAGNOSIS — Z9884 Bariatric surgery status: Secondary | ICD-10-CM | POA: Diagnosis not present

## 2019-05-20 ENCOUNTER — Inpatient Hospital Stay (HOSPITAL_COMMUNITY): Payer: Medicare Other | Attending: Nurse Practitioner

## 2019-05-20 ENCOUNTER — Other Ambulatory Visit: Payer: Self-pay

## 2019-05-20 DIAGNOSIS — Z79811 Long term (current) use of aromatase inhibitors: Secondary | ICD-10-CM | POA: Insufficient documentation

## 2019-05-20 DIAGNOSIS — Z79899 Other long term (current) drug therapy: Secondary | ICD-10-CM | POA: Insufficient documentation

## 2019-05-20 DIAGNOSIS — I10 Essential (primary) hypertension: Secondary | ICD-10-CM | POA: Diagnosis not present

## 2019-05-20 DIAGNOSIS — J45909 Unspecified asthma, uncomplicated: Secondary | ICD-10-CM | POA: Diagnosis not present

## 2019-05-20 DIAGNOSIS — E785 Hyperlipidemia, unspecified: Secondary | ICD-10-CM | POA: Diagnosis not present

## 2019-05-20 DIAGNOSIS — C50211 Malignant neoplasm of upper-inner quadrant of right female breast: Secondary | ICD-10-CM | POA: Insufficient documentation

## 2019-05-20 DIAGNOSIS — Z923 Personal history of irradiation: Secondary | ICD-10-CM | POA: Insufficient documentation

## 2019-05-20 DIAGNOSIS — M8580 Other specified disorders of bone density and structure, unspecified site: Secondary | ICD-10-CM | POA: Diagnosis not present

## 2019-05-20 DIAGNOSIS — Z7951 Long term (current) use of inhaled steroids: Secondary | ICD-10-CM | POA: Diagnosis not present

## 2019-05-20 DIAGNOSIS — K219 Gastro-esophageal reflux disease without esophagitis: Secondary | ICD-10-CM | POA: Diagnosis not present

## 2019-05-20 DIAGNOSIS — Z8249 Family history of ischemic heart disease and other diseases of the circulatory system: Secondary | ICD-10-CM | POA: Diagnosis not present

## 2019-05-20 DIAGNOSIS — Z803 Family history of malignant neoplasm of breast: Secondary | ICD-10-CM | POA: Insufficient documentation

## 2019-05-20 DIAGNOSIS — Z170 Estrogen receptor positive status [ER+]: Secondary | ICD-10-CM | POA: Diagnosis not present

## 2019-05-20 LAB — CBC WITH DIFFERENTIAL/PLATELET
Abs Immature Granulocytes: 0.02 10*3/uL (ref 0.00–0.07)
Basophils Absolute: 0.1 10*3/uL (ref 0.0–0.1)
Basophils Relative: 1 %
Eosinophils Absolute: 0.3 10*3/uL (ref 0.0–0.5)
Eosinophils Relative: 5 %
HCT: 37.4 % (ref 36.0–46.0)
Hemoglobin: 12 g/dL (ref 12.0–15.0)
Immature Granulocytes: 0 %
Lymphocytes Relative: 28 %
Lymphs Abs: 1.7 10*3/uL (ref 0.7–4.0)
MCH: 32.5 pg (ref 26.0–34.0)
MCHC: 32.1 g/dL (ref 30.0–36.0)
MCV: 101.4 fL — ABNORMAL HIGH (ref 80.0–100.0)
Monocytes Absolute: 0.7 10*3/uL (ref 0.1–1.0)
Monocytes Relative: 11 %
Neutro Abs: 3.3 10*3/uL (ref 1.7–7.7)
Neutrophils Relative %: 55 %
Platelets: 276 10*3/uL (ref 150–400)
RBC: 3.69 MIL/uL — ABNORMAL LOW (ref 3.87–5.11)
RDW: 12.2 % (ref 11.5–15.5)
WBC: 6 10*3/uL (ref 4.0–10.5)
nRBC: 0 % (ref 0.0–0.2)

## 2019-05-20 LAB — COMPREHENSIVE METABOLIC PANEL
ALT: 38 U/L (ref 0–44)
AST: 38 U/L (ref 15–41)
Albumin: 3.9 g/dL (ref 3.5–5.0)
Alkaline Phosphatase: 92 U/L (ref 38–126)
Anion gap: 11 (ref 5–15)
BUN: 10 mg/dL (ref 8–23)
CO2: 27 mmol/L (ref 22–32)
Calcium: 9.1 mg/dL (ref 8.9–10.3)
Chloride: 92 mmol/L — ABNORMAL LOW (ref 98–111)
Creatinine, Ser: 0.61 mg/dL (ref 0.44–1.00)
GFR calc Af Amer: >60 mL/min (ref >60)
GFR calc non Af Amer: >60 mL/min (ref >60)
Glucose, Bld: 110 mg/dL — ABNORMAL HIGH (ref 70–99)
Potassium: 3.7 mmol/L (ref 3.5–5.1)
Sodium: 130 mmol/L — ABNORMAL LOW (ref 135–145)
Total Bilirubin: 0.8 mg/dL (ref 0.3–1.2)
Total Protein: 6.5 g/dL (ref 6.5–8.1)

## 2019-05-20 LAB — VITAMIN B12: Vitamin B-12: 1158 pg/mL — ABNORMAL HIGH (ref 180–914)

## 2019-05-20 LAB — LACTATE DEHYDROGENASE: LDH: 107 U/L (ref 98–192)

## 2019-05-20 LAB — IRON AND TIBC
Iron: 114 ug/dL (ref 28–170)
Saturation Ratios: 34 % — ABNORMAL HIGH (ref 10.4–31.8)
TIBC: 334 ug/dL (ref 250–450)
UIBC: 220 ug/dL

## 2019-05-20 LAB — FERRITIN: Ferritin: 78 ng/mL (ref 11–307)

## 2019-05-20 LAB — VITAMIN D 25 HYDROXY (VIT D DEFICIENCY, FRACTURES): Vit D, 25-Hydroxy: 38.73 ng/mL (ref 30–100)

## 2019-05-20 LAB — FOLATE: Folate: 20.9 ng/mL (ref >5.9)

## 2019-05-23 ENCOUNTER — Other Ambulatory Visit: Payer: Self-pay

## 2019-05-23 ENCOUNTER — Other Ambulatory Visit (HOSPITAL_COMMUNITY): Payer: Medicare Other

## 2019-05-23 ENCOUNTER — Inpatient Hospital Stay (HOSPITAL_BASED_OUTPATIENT_CLINIC_OR_DEPARTMENT_OTHER): Payer: Medicare Other | Admitting: Nurse Practitioner

## 2019-05-23 VITALS — BP 124/62 | HR 84 | Temp 97.8°F | Resp 18 | Wt 211.0 lb

## 2019-05-23 DIAGNOSIS — C50211 Malignant neoplasm of upper-inner quadrant of right female breast: Secondary | ICD-10-CM | POA: Diagnosis not present

## 2019-05-23 DIAGNOSIS — Z170 Estrogen receptor positive status [ER+]: Secondary | ICD-10-CM | POA: Diagnosis not present

## 2019-05-23 DIAGNOSIS — J45909 Unspecified asthma, uncomplicated: Secondary | ICD-10-CM | POA: Diagnosis not present

## 2019-05-23 DIAGNOSIS — M8580 Other specified disorders of bone density and structure, unspecified site: Secondary | ICD-10-CM | POA: Diagnosis not present

## 2019-05-23 DIAGNOSIS — I10 Essential (primary) hypertension: Secondary | ICD-10-CM | POA: Diagnosis not present

## 2019-05-23 DIAGNOSIS — Z79811 Long term (current) use of aromatase inhibitors: Secondary | ICD-10-CM | POA: Diagnosis not present

## 2019-05-23 MED ORDER — ANASTROZOLE 1 MG PO TABS: 1.0000 mg | ORAL_TABLET | Freq: Every day | ORAL | 4 refills | Status: DC

## 2019-05-23 NOTE — Patient Instructions (Signed)
[facility] Discharge Instructions  Follow up in 6 months with labs and mammogram   Thank you for choosing  [facility] to provide your oncology and hematology care.  To afford each patient quality time with our provider, please arrive at least 15 minutes before your scheduled appointment time.   If you have a lab appointment with the Cancer Center please come in thru the Main Entrance and check in at the main information desk.  You need to re-schedule your appointment should you arrive 10 or more minutes late.  We strive to give you quality time with our providers, and arriving late affects you and other patients whose appointments are after yours.  Also, if you no show three or more times for appointments you may be dismissed from the clinic at the providers discretion.     Again, thank you for choosing [facility].  Our hope is that these requests will decrease the amount of time that you wait before being seen by our physicians.       _____________________________________________________________  Should you have questions after your visit to [facility], please contact our office at [phone] between the hours of 8:00 a.m. and 4:30 p.m.  Voicemails left after 4:00 p.m. will not be returned until the following business day.  For prescription refill requests, have your pharmacy contact our office and allow 72 hours.    Due to Covid, you will need to wear a mask upon entering the hospital. If you do not have a mask, a mask will be given to you at the Main Entrance upon arrival. For doctor visits, patients may have 1 support person with them. For treatment visits, patients can not have anyone with them due to social distancing guidelines and our immunocompromised population.      

## 2019-05-23 NOTE — Assessment & Plan Note (Addendum)
1.  Stage I right breast cancer: - Patient had her regular screening mammogram on 10/05/2015 which was BI-RADS Category 0 incomplete. - She had a diagnostic mammogram on 10/20/2015 which was BI-RADS Category 4 suspicious.  It showed a 5 mm right breast mass at the 1 o'clock position. -They set her up for an ultrasound guided biopsy of the mass on 10/27/2015.  It showed invasive ductal carcinoma and was ER/PR 100% positive.  HER-2 negative.  Ki-67 at 3%. - She had a lumpectomy and sentinel node biopsy with Dr. Barry Dienes on 11/11/2015. -She was treated with adjuvant radiation. -Her Oncotype evaluation placed her in the low risk category. - She was placed on Arimidex 5/2017she will continue this for 5 years. - Her last mammogram on 11/27/2018 which showed B RADS category 2 benign follow-up in 1 year. -Labs done on 05/20/2019 showed potassium 3.7, creatinine 0.61, LFTs WNL, LDH 27, WBC 6.0, hemoglobin 12, platelets 276 - She will follow-up in 6 months with repeat labs and mammogram.  2.  Osteopenia: - She had a bone density test on 10/23/2017 - It showed a T score of -1.4 which is osteopenia. -She is taking calcium and vitamin D daily as recommended. -We will order her repeat bone density test at her next visit in April.

## 2019-05-23 NOTE — Progress Notes (Signed)
[place], [place] [address]   CLINIC:  Medical Oncology/Hematology  PCP:  Sharilyn Sites, [place] [address] [phone]   REASON FOR VISIT: Follow-up for breast cancer  CURRENT THERAPY: Arimidex  BRIEF ONCOLOGIC HISTORY:  Oncology History  Breast cancer of upper-inner quadrant of right female breast ([place])  09/24/2014 Imaging   DEXA with normal bone density   10/06/2015 Imaging   Screening bilateral mammogram BI-RADS Category 0, R breast possible mass, L breast possible mass   10/20/2015 Imaging   Diagnostic B mammogram, R breast Ultrasound with suspicious mass in the R breast at the 1 o clock location 5 cm from the nipple, LN in the low R axillae with cortex nodularity   10/27/2015 Initial Biopsy   Ultrasound guided biopsy of R axillary LN, BENIGN. Ultrasound guided biopsy of R breast with invasive ductal carcinoma   11/04/2015 Initial Diagnosis   Breast cancer of upper-inner quadrant of right female breast ([place])   11/09/2015 Procedure   Radioactive seed localization R breast   11/11/2015 Surgery   R breast radioactive seed localized lumpectomy and seed targeted sentinel LN biopsy   11/11/2015 Pathology Results   Invasive ductal carcinoma Grade I/III spanning 1.1 cm, lobular neoplasia (LCIS) resection margins negative. 3 negative sentinel nodes ER (100%), PR (100%), Her2 neu negative pT1c, pN0   12/11/2015 Oncotype testing   Recurrence Score result 11, 10 year risk of distant recurrence with tamoxifen alone 8% (low-risk)   01/11/2016 - 02/04/2016 Radiation Therapy   Adjuvant breast radiation ([facility]): 42.72 Gy in 16 fractions to the right breast   01/2016 -  Anti-estrogen oral therapy   Anastrazole 1 mg daily. Planned duration of therapy: 5 years.       CANCER STAGING: Cancer Staging Breast cancer of upper-inner quadrant of right female breast [facility]) Staging form: Breast, AJCC 7th Edition -  Pathologic stage from 11/11/2015: Stage IA (T1c, N0, cM0) - Signed by Holley Bouche, NP on 04/18/2016    INTERVAL HISTORY:  Ms. Fleeman 73 y.o. female returns for routine follow-up for breast cancer.  Patient reports she has been feeling well since her last visit.  She denies any easy bruising or bleeding.  She denies any new lumps present. Denies any nausea, vomiting, or diarrhea. Denies any new pains. Had not noticed any recent bleeding such as epistaxis, hematuria or hematochezia. Denies recent chest pain on exertion, shortness of breath on minimal exertion, pre-syncopal episodes, or palpitations. Denies any numbness or tingling in hands or feet. Denies any recent fevers, infections, or recent hospitalizations. Patient reports appetite at 100% and energy level at 100%.  She is eating well maintain her weight at this time.    REVIEW OF SYSTEMS:  Review of Systems  All other systems reviewed and are negative.    PAST MEDICAL/SURGICAL HISTORY:  Past Medical History:  Diagnosis Date  . Arthritis   . Asthma   . Breast cancer ([place])   . Breast disorder    cancer  . Breast mass    benign  . Cellulitis   . GERD (gastroesophageal reflux disease)   . Heart murmur    AS CHILD  OUTGREW IT  . History of hiatal hernia   . Hyperlipidemia   . Hypertension   . Impaired fasting glucose   . Rectocele   . Varicose vein of leg    Past Surgical History:  Procedure Laterality Date  . BREAST LUMPECTOMY WITH RADIOACTIVE SEED AND  SENTINEL LYMPH NODE BIOPSY Right 11/11/2015   Procedure: RIGHT BREAST RADIOACTIVE SEED GUIDED  LUMPECTOMY AND RADIOACTIVE SEED TARGETED SENTINEL LYMPH NODE BIOPSY;  Surgeon: Stark Klein, MD;  Location: [facility];  Service: General;  Laterality: Right;  . BREAST SURGERY Left    breast biopies x4  . CHOLECYSTECTOMY  09/08/2010  . COLONOSCOPY    . COLONOSCOPY N/A 09/25/2013   Procedure: COLONOSCOPY;  Surgeon: Rogene Houston, MD;  [facility];   Service: Endoscopy;  Laterality: N/A;  100-moved to 1200 Ann to notify pt  . HERNIA REPAIR  12/24/2010, 12-2014   TOTAL 4  . LAPAROSCOPIC GASTRIC SLEEVE RESECTION  11-2013   at [facility]  2007/2008  . TONSILLECTOMY  1968  . TOTAL KNEE ARTHROPLASTY Left 05/02/2017  . TOTAL KNEE ARTHROPLASTY Left 05/02/2017   Procedure: LEFT TOTAL KNEE ARTHROPLASTY;  Surgeon: [name], MD;  Location: [facility];  Service: Orthopedics;  Laterality: Left;  . UPPER GASTROINTESTINAL ENDOSCOPY    . VAGINAL HYSTERECTOMY  1980s     SOCIAL HISTORY:  Social History   Socioeconomic History  . Marital status: Married    Spouse name: Not on file  . Number of children: 2  . Years of education: Not on file  . Highest education level: Not on file  Occupational History  . Occupation: retired    Fish farm manager: RETIRED    Comment: teacher  Social Needs  . Financial resource strain: Not on file  . Food insecurity    Worry: Not on file    Inability: Not on file  . Transportation needs    Medical: Not on file    Non-medical: Not on file  Tobacco Use  . Smoking status: Never Smoker  . Smokeless tobacco: Never Used  Substance and Sexual Activity  . Alcohol use: No  . Drug use: No  . Sexual activity: Yes    Birth control/protection: Surgical    Comment: hyst  Lifestyle  . Physical activity    Days per week: Not on file    Minutes per session: Not on file  . Stress: Not on file  Relationships  . Social Herbalist on phone: Not on file    Gets together: Not on file    Attends religious service: Not on file    Active member of club or organization: Not on file    Attends meetings of clubs or organizations: Not on file    Relationship status: Not on file  . Intimate partner violence    Fear of current or ex partner: Not on file    Emotionally abused: Not on file    Physically abused: Not on file    Forced sexual activity: Not on file  Other Topics Concern  .  Not on file  Social History Narrative  . Not on file    FAMILY HISTORY:  Family History  Problem Relation Age of Onset  . Colon cancer Father   . Rectal cancer Father   . Prostate cancer Father   . Dementia Mother   . Osteoporosis Mother   . Healthy Daughter   . Healthy Son   . Pancreatic cancer Paternal Grandfather   . Heart disease Paternal Grandmother   . Heart disease Maternal Grandfather   . Breast cancer Maternal Grandmother   . Scoliosis Sister     CURRENT MEDICATIONS:  Outpatient Encounter Medications as of 05/23/2019  Medication Sig  . anastrozole (  ARIMIDEX) 1 MG tablet Take 1 tablet (1 mg total) by mouth daily.  . B Complex Vitamins (B-COMPLEX/B-12 SL) Place 1 tablet under the tongue daily.   [name] esomeprazole (NEXIUM) 20 MG capsule Take 40 mg by mouth daily before breakfast.   . famotidine (PEPCID) 20 MG tablet Take 20 mg by mouth at bedtime.   [name] guaiFENesin (MUCINEX) 600 MG 12 hr tablet Take 600 mg by mouth every other day.  . Iron-Vitamin C (IRON 100/C PO) Take 1 tablet by mouth daily. Celebrate iron + vit C  . losartan-hydrochlorothiazide (HYZAAR) 100-25 MG tablet Take 1 tablet by mouth daily.  . Magnesium 250 MG TABS Take 500 mg by mouth every evening.  . Menaquinone-7 (VITAMIN K2) 100 MCG CAPS Take 100 mcg by mouth every evening.   . Multiple Vitamin (MULTIVITAMIN) capsule Take 1 capsule by mouth 2 (two) times daily with a meal. Celebrate Bariatric MVI  . pravastatin (PRAVACHOL) 20 MG tablet Take 20 mg by mouth at bedtime.   . budesonide-formoterol (SYMBICORT) 80-4.5 MCG/ACT inhaler Take 2 puffs first thing in am and then another 2 puffs about 12 hours later. (Patient not taking: Reported on 05/23/2019)  . budesonide-formoterol (SYMBICORT) 80-4.5 MCG/ACT inhaler Inhale 2 puffs into the lungs 2 (two) times daily. (Patient not taking: Reported on 05/23/2019)  . Calcium Citrate-Vitamin D (CALCIUM CITRATE CHEWY BITE) 500-500 MG-UNIT CHEW Chew 500 mg by mouth 2 (two)  times daily.   [name] docusate sodium (COLACE) 100 MG capsule Take 100 mg by mouth at bedtime.   [name] MELATONIN PO Take 1 tablet by mouth at bedtime as needed (sleep).  . triamcinolone cream (KENALOG) 0.1 % Apply 1 application topically as needed.    No facility-administered encounter medications on file as of 05/23/2019.     ALLERGIES:  Allergies  Allergen Reactions  . Horse-Derived Products     Reaction as a child "heart stopped"  . Sulfa Antibiotics Hives  . Sulfamethoxazole-Trimethoprim Rash  . Tetanus Toxoid Adsorbed Palpitations    As a child     PHYSICAL EXAM:  ECOG Performance status: 1  Vitals:   05/23/19 1149  BP: 124/62  Pulse: 84  Resp: 18  Temp: 97.8 F (36.6 C)  SpO2: 98%   Filed Weights   05/23/19 1149  Weight: 211 lb (95.7 kg)    Physical Exam Constitutional:      Appearance: Normal appearance. She is normal weight.  Cardiovascular:     Rate and Rhythm: Normal rate and regular rhythm.     Heart sounds: Normal heart sounds.  Pulmonary:     Effort: Pulmonary effort is normal.     Breath sounds: Normal breath sounds.  Abdominal:     General: Bowel sounds are normal.     Palpations: Abdomen is soft.  Musculoskeletal: Normal range of motion.  Skin:    General: Skin is warm.  Neurological:     Mental Status: She is alert and oriented to person, place, and time. Mental status is at baseline.  Psychiatric:        Mood and Affect: Mood normal.        Behavior: Behavior normal.        Thought Content: Thought content normal.        Judgment: Judgment normal.   Breast: No palpable masses, no skin changes or nipple discharge, no adenopathy.   LABORATORY DATA:  I have reviewed the labs as listed.  CBC    Component Value Date/Time   WBC 6.0 05/20/2019 1024  RBC 3.69 (L) 05/20/2019 1024   HGB 12.0 05/20/2019 1024   HCT 37.4 05/20/2019 1024   PLT 276 05/20/2019 1024   MCV 101.4 (H) 05/20/2019 1024   MCH 32.5 05/20/2019 1024   MCHC 32.1 05/20/2019  1024   RDW 12.2 05/20/2019 1024   LYMPHSABS 1.7 05/20/2019 1024   MONOABS 0.7 05/20/2019 1024   EOSABS 0.3 05/20/2019 1024   BASOSABS 0.1 05/20/2019 1024   CMP Latest Ref Rng & Units 05/20/2019 11/27/2018 06/18/2018  Glucose 70 - 99 mg/dL 110(H) 93 -  BUN 8 - 23 mg/dL 10 14 -  Creatinine 0.44 - 1.00 mg/dL 0.61 0.67 0.50  Sodium 135 - 145 mmol/L 130(L) 135 -  Potassium 3.5 - 5.1 mmol/L 3.7 3.9 -  Chloride 98 - 111 mmol/L 92(L) 99 -  CO2 22 - 32 mmol/L 27 26 -  Calcium 8.9 - 10.3 mg/dL 9.1 9.3 -  Total Protein 6.5 - 8.1 g/dL 6.5 7.1 -  Total Bilirubin 0.3 - 1.2 mg/dL 0.8 0.8 -  Alkaline Phos 38 - 126 U/L 92 108 -  AST 15 - 41 U/L 38 51(H) -  ALT 0 - 44 U/L 38 39 -     I personally performed a face-to-face visit.  All questions were answered to patient's stated satisfaction. Encouraged patient to call with any new concerns or questions before his next visit to the cancer center and we can certain see him sooner, if needed.     ASSESSMENT & PLAN:   Breast cancer of upper-inner quadrant of right female breast ([place]) 1.  Stage I right breast cancer: - Patient had her regular screening mammogram on 10/05/2015 which was BI-RADS Category 0 incomplete. - She had a diagnostic mammogram on 10/20/2015 which was BI-RADS Category 4 suspicious.  It showed a 5 mm right breast mass at the 1 o'clock position. -They set her up for an ultrasound guided biopsy of the mass on 10/27/2015.  It showed invasive ductal carcinoma and was ER/PR 100% positive.  HER-2 negative.  Ki-67 at 3%. - She had a lumpectomy and sentinel node biopsy with Dr. Barry Dienes on 11/11/2015. -She was treated with adjuvant radiation. -Her Oncotype evaluation placed her in the low risk category. - She was placed on Arimidex 5/2017she will continue this for 5 years. - Her last mammogram on 11/27/2018 which showed B RADS category 2 benign follow-up in 1 year. -Labs done on 05/20/2019 showed potassium 3.7, creatinine 0.61, LFTs WNL, LDH 27,  WBC 6.0, hemoglobin 12, platelets 276 - She will follow-up in 6 months with repeat labs and mammogram.  2.  Osteopenia: - She had a bone density test on 10/23/2017 - It showed a T score of -1.4 which is osteopenia. -She is taking calcium and vitamin D daily as recommended. -We will order her repeat bone density test at her next visit in April.      Orders placed this encounter:  Orders Placed This Encounter  Procedures  . MM DIAG BREAST TOMO BILATERAL  . Lactate dehydrogenase  . Vitamin B12  . VITAMIN D 25 Hydroxy (Vit-D Deficiency, Fractures)  . CBC with Differential/Platelet  . Comprehensive metabolic panel      Thekla Colborn, FNP-C [place] [phone]

## 2019-05-30 ENCOUNTER — Ambulatory Visit (HOSPITAL_COMMUNITY): Payer: Medicare Other | Admitting: Nurse Practitioner

## 2019-06-07 DIAGNOSIS — N3020 Other chronic cystitis without hematuria: Secondary | ICD-10-CM | POA: Diagnosis not present

## 2019-06-07 DIAGNOSIS — R350 Frequency of micturition: Secondary | ICD-10-CM | POA: Diagnosis not present

## 2019-07-10 ENCOUNTER — Telehealth: Payer: Self-pay | Admitting: Internal Medicine

## 2019-07-10 NOTE — Telephone Encounter (Signed)
symbicort is the preferred rx, same dose as before, though no problem finishing the breztri samples  Generic is ok

## 2019-07-10 NOTE — Telephone Encounter (Signed)
Spoke with the pt  She states that she ran out of symbicort 2 months ago  She was seen by her PCP and they gave her a sample of breztri  She states she could not tell the difference between the two inhalers  She wants to know if she should keep using breztri (if so will need rx) or if we can call her in the generic for symbicort? Please advise thanks!

## 2019-07-10 NOTE — Telephone Encounter (Signed)
Called and spoke with pt letting her know the info stated by MW. Pt said that symbicort is going to cost her $325 which she cannot afford.

## 2019-07-11 NOTE — Telephone Encounter (Signed)
She can have a sample of breztri if she's going to run out before she can come in with her drug formulary in hand

## 2019-07-11 NOTE — Telephone Encounter (Signed)
Called and spoke with pt letting her know the info stated by MW. Pt verbalized understanding. appt scheduled for pt with MW 12/15 and pt told to bring formulary list with her to appt. Nothing further needed.

## 2019-07-16 ENCOUNTER — Ambulatory Visit (INDEPENDENT_AMBULATORY_CARE_PROVIDER_SITE_OTHER): Payer: Medicare Other | Admitting: Internal Medicine

## 2019-07-16 ENCOUNTER — Encounter: Payer: Self-pay | Admitting: Internal Medicine

## 2019-07-16 ENCOUNTER — Other Ambulatory Visit: Payer: Self-pay

## 2019-07-16 DIAGNOSIS — J45991 Cough variant asthma: Secondary | ICD-10-CM

## 2019-07-16 MED ORDER — BREZTRI AEROSPHERE 160-9-4.8 MCG/ACT IN AERO: 2.0000 | INHALATION_SPRAY | Freq: Two times a day (BID) | RESPIRATORY_TRACT | 0 refills | Status: DC

## 2019-07-16 NOTE — Patient Instructions (Signed)
Call me with the alternatives on Drug Formulary for inhalers (not powders) before you present samples run out    If you are satisfied with your treatment plan,  let your doctor know and he/she can either refill your medications or you can return here when your prescription runs out.     If in any way you are not 100% satisfied,  please tell us.  If 100% better, tell your friends!  Pulmonary follow up is as needed

## 2019-07-16 NOTE — Assessment & Plan Note (Addendum)
Onset in childhood  - Eos 0.3 04/07/18 - d/c advair 04/13/2018  - 04/13/2018   try symb 80 2bid  - Allergy profile 05/15/2018 >  Eos 0.3 /  IgE  1227 RAST pos to everything but cats and mold  - 05/15/2018    change symbicort to 80 2bid "prn"  - 11/20/2018 E-visit Cough resolved after zpack and prednisone. Continue symbicort 80 once daily. Add daily antihistamine during peak allergy season.  - 07/16/2019  After extensive coaching inhaler device,  effectiveness =  90% from baseline 75%   Adequate control on present rx, reviewed in detail with pt > does not really need breztri but appears to be benefiting from the fact the breztri samples have 2/3 of the same ingredients as symbicort 1609 and no immediate change in rx needed but issues with cost are problematic so   1) reminded that does not need to take the full 2bid dose if doing great Based on two studies from NEJM  378; 20 p 1865 (2018) and 380 : p2020-30 (2019) in pts with mild asthma it is reasonable to use low dose symbicort eg 80 2bid "prn" flare in this setting but I emphasized this was only shown with symbicort and takes advantage of the rapid onset of action but is not the same as "rescue therapy" but can be stopped once the acute symptoms have resolved and the need for rescue has been minimized (< 2 x weekly)    2) Advised:  formulary restrictions will be an ongoing challenge for the forseable future and I would be happy to pick an alternative if the pt will first  provide me a list of them -  pt  will need to return here for training for any new device that is required eg dpi vs hfa vs respimat.    In the meantime we can always provide samples so that the patient never runs out of any needed respiratory medications.   3) if doing great ok to let Dr Hilma Favors refill meds and return here prn    I had an extended discussion with the patient reviewing all relevant studies completed to date and  lasting 15 to 20 minutes of a 25 minute visit    I  performed detailed device teaching using a teach back method which extended face to face time for this visit (see above)  Each maintenance medication was reviewed in detail including emphasizing most importantly the difference between maintenance and prns and under what circumstances the prns are to be triggered using an action plan format that is not reflected in the computer generated alphabetically organized AVS which I have not found useful in most complex patients, especially with respiratory illnesses  Please see AVS for specific instructions unique to this visit that I personally wrote and verbalized to the the pt in detail and then reviewed with pt  by my nurse highlighting any  changes in therapy recommended at today's visit to their plan of care.

## 2019-07-16 NOTE — Progress Notes (Signed)
Caitlin [name], female    DOB: Aug 05, 1945   MRN: [id number]    Brief patient profile:  89 yowf never smoker asthma as child outgrew it about age teens then some seasonal sinus problems since the 1990s  but around 2007 developed recurrent pattern of breathing problems esp in spring dx as asthma > asmanex and singulair seemed to work ok until around 2012 when had her 1st hernia surgery and since then symptoms persistent year round so 03/27/2012 referred to pulmonary clinic     History of Present Illness  03/27/2012 1st pulmonary ov / Caitlin [name] cc > one year daily p stirs in am (but doesn't wake her) cc sense of  congestion/variable pattern prod of thick sputum/ variable color better p neb with alb/pulmicort.  Sinus spring > fall and not an active problem. Never rx with prednisone. No gagging, some urination.  All started p Feb 2012 p lap chole and several other procedures and coughs so hard it's causing a problem with the wound healing resulting in worsening hernia and has been told she will need more surgery. rec Nexium 40 mg Take 30-60 min before first meal of the day and Pepcid 20 mg one at bedtime until return mucinex dm for cough 2 every 12 hours GERD   Prednisone 10 mg take  4 each am x 2 days,   2 each am x 2 days,  1 each am x2days and stop  Stop asmanex and continue pulmicort with albuterol twice daily > resolved short term         03/05/2013 f/u ov/Caitlin [name] re asthma     Chief Complaint  Patient presents with  . Follow-up    Breathing has improved.  No SOB, wheezing, chest tightenss, chest pain, or cough noted at this time.  On  symbicort 160 one twice daily also singulair and never nebulizer any more rec Ok to stop singulair Plan A = automatic = Symbicort 160 Take 1 puffs first thing in am and then another 2 puffs about 12 hours later - if doing great off singulair in a couple of weeks then ok to stop the symbicort too - it will start working in 5 min if needed  Plan B = backup Only  use your albuterol as a rescue medication to be used For cough > mucinex dm 1200 every 12 hours as needed  For cough/ congestion > mucinex as needed     04/13/2018  Pulmonary/ re-establish re recurrent cough  Chief Complaint  Patient presents with  . Pulmonary Consult    Referred by Dr. Hilma [name]. Pt c/o cough for the past months- occ prod with green to yellow sputum.    couple times a year tends to flare after stops meds/ never restarted maint symb/ takes advair dpi "prn"  S/p gastric sleeve 2015 got down to 172 but slowly increasing S/p R breast ca/ rad on Right side only completed July 2017  Cough x sev months x  Daytime  Maybe worse in heat/ not after meals or hs  Not limited by breathing from desired activities  Presently on pred/ abx for L leg rash ? Cellulitis rec Nexium 40 mg Take 30-60 min before first meal of the day plus  pepcid 20 mg after supper  Symbicort 80 Take 2 puffs first thing in am and then another 2 puffs about 12 hours later.  Work on inhaler technique:   For cough > delsym 2 tsp every 12 hours  GERD  Diet  05/15/2018  f/u ov/Caitlin [name] re:  Cough variant asthma all symptoms resolved on symb 80 2bid  Chief Complaint  Patient presents with  . Follow-up    Doing better cough is gone feels symbicort is working but it is expensive.   Dyspnea:  Not limited by breathing from desired activities   Cough: resolved  Sleeping: flat/ one pillow SABA use: none rec Ok to use symbicort 80 up to 2 pffs every 12hours as needed to control your respiratory symptoms  If after a week if not 100% better then we need to see you right away  - bring your formulary with you from your insurance    10/09/2018  f/u ov/Caitlin [name] re: cough variant asthma  Chief Complaint  Patient presents with  . Acute Visit    Pt c/o chest congestion and cough for the past 10 days. Her cough has been prod at times with green sputum.   doing fine off symbicort chronically and  on gerd rx then 10 d prior to [place] then cough/ congestion rx zpak already completed 2 d prior to OV   Has neb not using  Worse cough p head hits pillows / no fever  rec Plan A = Automatic = Symbicort 80 Take 2 puffs first thing in am and then another 2 puffs about 12 hours later.  Work on inhaler technique:  Prednisone 10 mg take  4 each am x 2 days,   2 each am x 2 days,  1 each am x 2 days and stop - take with breakfast  Plan B = Backup - only use your albuterol nebulizer if you first try Plan B and it fails to help > ok to use the nebulizer up to every 4 hours but if start needing it regularly call for immediate appointment    07/16/2019  f/u ov/Caitlin [name] re: cough variant asthma doing great on symb or dulera or breztri but having cost issues  Chief Complaint  Patient presents with  . Follow-up    Pt is here today to discuss inhalers to try to change to one that is covered by insurance. Pt states her breathing has been doing fine.  Dyspnea:  Not limited by breathing from desired activities   Cough: all better on breztri Sleeping: sleep number < 30 degrees  SABA use: none  02: none    No obvious day to day or daytime variability or assoc excess/ purulent sputum or mucus plugs or hemoptysis or cp or chest tightness, subjective wheeze or overt sinus or hb symptoms.   Sleeping as above without nocturnal  or early am exacerbation  of respiratory  c/o's or need for noct saba. Also denies any obvious fluctuation of symptoms with weather or environmental changes or other aggravating or alleviating factors except as outlined above   No unusual exposure hx or h/o childhood pna/    knowledge of premature birth.  Current Allergies, Complete Past Medical History, Past Surgical History, Family History, and Social History were reviewed in [employer] [employer] record.  ROS  The following are not active complaints unless bolded Hoarseness, sore throat, dysphagia, dental problems, itching, sneezing,  nasal congestion or  discharge of excess mucus or purulent secretions, ear ache,   fever, chills, sweats, unintended wt loss or wt gain, classically pleuritic or exertional cp,  orthopnea pnd or arm/hand swelling  or leg swelling, presyncope, palpitations, abdominal pain, anorexia, nausea, vomiting, diarrhea  or change in bowel habits or change in bladder habits,  change in stools or change in urine, dysuria, hematuria,  rash, arthralgias, visual complaints, headache, numbness, weakness or ataxia or problems with walking or coordination,  change in mood or  memory.        Current Meds  Medication Sig  . anastrozole (ARIMIDEX) 1 MG tablet Take 1 tablet (1 mg total) by mouth daily.  . B Complex Vitamins (B-COMPLEX/B-12 SL) Place 1 tablet under the tongue daily.   . Calcium Citrate-Vitamin D (CALCIUM CITRATE CHEWY BITE) 500-500 MG-UNIT CHEW Chew 500 mg by mouth 2 (two) times daily.   . cephALEXin (KEFLEX) 250 MG capsule   . docusate sodium (COLACE) 100 MG capsule Take 100 mg by mouth at bedtime.   [name] esomeprazole (NEXIUM) 20 MG capsule Take 40 mg by mouth daily before breakfast.   . famotidine (PEPCID) 20 MG tablet Take 20 mg by mouth at bedtime.   [name] guaiFENesin (MUCINEX) 600 MG 12 hr tablet Take 600 mg by mouth as needed.   . Iron-Vitamin C (IRON 100/C PO) Take 1 tablet by mouth daily. Celebrate iron + vit C  . losartan-hydrochlorothiazide (HYZAAR) 100-25 MG tablet Take 1 tablet by mouth daily.  . Magnesium 400 MG CAPS Take 400 mg by mouth every evening.   [name] MELATONIN PO Take 1 tablet by mouth at bedtime as needed (sleep).  . Menaquinone-7 (VITAMIN K2) 100 MCG CAPS Take 100 mcg by mouth daily.   . Multiple Vitamin (MULTIVITAMIN) capsule Take 1 capsule by mouth 2 (two) times daily with a meal. Celebrate Bariatric MVI  . pravastatin (PRAVACHOL) 40 MG tablet Take 40 mg by mouth at bedtime.  . Probiotic Product (PROBIOTIC DAILY PO) Take 1 capsule by mouth daily.  . [DISCONTINUED] pravastatin (PRAVACHOL) 20 MG tablet Take 20  mg by mouth at bedtime.   . [DISCONTINUED] triamcinolone cream (KENALOG) 0.1 % Apply 1 application topically as needed.                       Objective:     amb obese wf no spont coughing    07/16/2019      209  10/09/2018       212  05/15/2018     202   04/13/18 208 lb (94.3 kg)  04/07/18 205 lb (93 kg)  02/12/18 205 lb (93 kg)     \Vital signs reviewed - Note on arrival 02 sats  96% on RA       HEENT : pt wearing mask not removed for exam due to covid -19 concerns.    NECK :  without JVD/Nodes/TM/ nl carotid upstrokes bilaterally   LUNGS: no acc muscle use,  Nl contour chest which is clear to A and P bilaterally without cough on insp or exp maneuvers   CV:  RRR  no s3 or murmur or increase in P2, and no edema   ABD:  soft and nontender with nl inspiratory excursion in the supine position. No bruits or organomegaly appreciated, bowel sounds nl  MS:  Nl gait/ ext warm without deformities, calf tenderness, cyanosis or clubbing No obvious joint restrictions   SKIN: warm and dry without lesions    NEURO:  alert, approp, nl sensorium with  no motor or cerebellar deficits apparent.              Assessment

## 2019-08-01 DIAGNOSIS — I10 Essential (primary) hypertension: Secondary | ICD-10-CM | POA: Diagnosis not present

## 2019-08-01 DIAGNOSIS — E7849 Other hyperlipidemia: Secondary | ICD-10-CM | POA: Diagnosis not present

## 2019-08-28 ENCOUNTER — Ambulatory Visit (INDEPENDENT_AMBULATORY_CARE_PROVIDER_SITE_OTHER): Payer: Medicare Other | Admitting: Orthopaedic Surgery

## 2019-08-28 ENCOUNTER — Encounter: Payer: Self-pay | Admitting: Orthopaedic Surgery

## 2019-08-28 VITALS — Ht 63.0 in | Wt 205.0 lb

## 2019-08-28 DIAGNOSIS — M25511 Pain in right shoulder: Secondary | ICD-10-CM

## 2019-08-28 DIAGNOSIS — G8929 Other chronic pain: Secondary | ICD-10-CM

## 2019-08-28 MED ORDER — LIDOCAINE HCL 1 % IJ SOLN
2.0000 mL | INTRAMUSCULAR | Status: AC | PRN
  Administered 2019-08-28: 2 mL

## 2019-08-28 MED ORDER — BUPIVACAINE HCL 0.5 % IJ SOLN
2.0000 mL | INTRAMUSCULAR | Status: AC | PRN
  Administered 2019-08-28: 2 mL via INTRA_ARTICULAR

## 2019-08-28 MED ORDER — METHYLPREDNISOLONE ACETATE 40 MG/ML IJ SUSP
80.0000 mg | INTRAMUSCULAR | Status: AC | PRN
  Administered 2019-08-28: 80 mg via INTRA_ARTICULAR

## 2019-08-28 NOTE — Progress Notes (Signed)
Office Visit Note   Patient: Caitlin [name]           Date of Birth: Dec 18, 1945           MRN: [id number] Visit Date: 08/28/2019              Requested by: Sharilyn Sites, [address] [place],  [place] [address] PCP: Sharilyn Sites, MD   Assessment & Plan: Visit Diagnoses:  1. Chronic right shoulder pain     Plan: Osteoarthritis right shoulder with previous MRI scan demonstrating an intact rotator cuff.  Will inject the glenohumeral joint with cortisone and monitor response  Follow-Up Instructions: Return if symptoms worsen or fail to improve.   Orders:  Orders Placed This Encounter  Procedures  . Large Joint Inj: R glenohumeral   No orders of the defined types were placed in this encounter.     Procedures: Large Joint Inj: R glenohumeral on 08/28/2019 1:59 PM Indications: pain and diagnostic evaluation Details: 25 G 1.5 in needle, posterior approach  Arthrogram: No  Medications: 2 mL lidocaine 1 %; 2 mL bupivacaine 0.5 %; 80 mg methylPREDNISolone acetate 40 MG/ML Consent was given by the patient. Immediately prior to procedure a time out was called to verify the correct patient, procedure, equipment, support staff and site/side marked as required. Patient was prepped and draped in the usual sterile fashion.       Clinical Data: No additional findings.   Subjective: Chief Complaint  Patient presents with  . Right Shoulder - Pain  Patient presents today for recurrent right shoulder pain. She was last seen on 04/03/2019 and received a cortisone injection. She said that it last about a month and wants another cortisone injection. She takes Aleve as needed.   HPI  Review of Systems   Objective: Vital Signs: Ht 5\' 3"  (1.6 m)   Wt 205 lb (93 kg)   BMI 36.31 kg/m   Physical Exam Constitutional:      Appearance: She is well-developed.  Eyes:     Pupils: Pupils are equal, round, and reactive to light.  Pulmonary:     Effort: Pulmonary effort is  normal.  Skin:    General: Skin is warm and dry.  Neurological:     Mental Status: She is alert and oriented to person, place, and time.  Psychiatric:        Behavior: Behavior normal.     Ortho Exam painful overhead arc of motion with right shoulder.  Lacks a few degrees to full flexion.  Abduction to 90 degrees.  Some pain with internal and external rotation along the glenohumeral joint.  Lacked external rotation compared to her left shoulder.  Good grip and good release.  No neck pain  Specialty Comments:  No specialty comments available.  Imaging: No results found.   PMFS History: Patient Active Problem List   Diagnosis Date Noted  . Chronic right shoulder pain 11/29/2018  . Localized swelling of left lower extremity 07/19/2018  . Cellulitis of left ankle 06/15/2018  . Unilateral primary osteoarthritis, left knee 05/02/2017  . S/P total knee replacement using cement, left 05/02/2017  . Primary osteoarthritis of left knee 12/21/2016  . Chondromalacia patellae, left knee 05/26/2016  . Breast cancer of upper-inner quadrant of right female breast ([facility]) 11/04/2015  . GERD (gastroesophageal reflux disease) 09/02/2013  . Obesity 09/02/2013  . Ventral hernia, recurrent 09/02/2013  . Unspecified asthma(493.90) 12/12/2012  . Cough variant asthma 03/27/2012  . Right middle lobe syndrome 03/27/2012  Past Medical History:  Diagnosis Date  . Arthritis   . Asthma   . Breast cancer ([facility])   . Breast disorder    cancer  . Breast mass    benign  . Cellulitis   . GERD (gastroesophageal reflux disease)   . Heart murmur    AS CHILD  OUTGREW IT  . History of hiatal hernia   . Hyperlipidemia   . Hypertension   . Impaired fasting glucose   . Rectocele   . Varicose vein of leg     Family History  Problem Relation Age of Onset  . Colon cancer Father   . Rectal cancer Father   . Prostate cancer Father   . Dementia Mother   . Osteoporosis Mother   . Healthy Daughter   .  Healthy Son   . Pancreatic cancer Paternal Grandfather   . Heart disease Paternal Grandmother   . Heart disease Maternal Grandfather   . Breast cancer Maternal Grandmother   . Scoliosis Sister     Past Surgical History:  Procedure Laterality Date  . BREAST LUMPECTOMY WITH RADIOACTIVE SEED AND SENTINEL LYMPH NODE BIOPSY Right 11/11/2015   Procedure: RIGHT BREAST RADIOACTIVE SEED GUIDED  LUMPECTOMY AND RADIOACTIVE SEED TARGETED SENTINEL LYMPH NODE BIOPSY;  Surgeon: Stark Klein, MD;  Location: [facility];  Service: General;  Laterality: Right;  . BREAST SURGERY Left    breast biopies x4  . CHOLECYSTECTOMY  09/08/2010  . COLONOSCOPY    . COLONOSCOPY N/A 09/25/2013   Procedure: COLONOSCOPY;  Surgeon: Rogene Houston, MD;  [facility];  Service: Endoscopy;  Laterality: N/A;  100-moved to 1200 Ann to notify pt  . HERNIA REPAIR  12/24/2010, 12-2014   TOTAL 4  . LAPAROSCOPIC GASTRIC SLEEVE RESECTION  11-2013   at [facility]  2007/2008  . TONSILLECTOMY  1968  . TOTAL KNEE ARTHROPLASTY Left 05/02/2017  . TOTAL KNEE ARTHROPLASTY Left 05/02/2017   Procedure: LEFT TOTAL KNEE ARTHROPLASTY;  Surgeon: [name], MD;  Location: [facility];  Service: Orthopedics;  Laterality: Left;  . UPPER GASTROINTESTINAL ENDOSCOPY    . VAGINAL HYSTERECTOMY  1980s   Social History   Occupational History  . Occupation: retired    Fish farm manager: RETIRED    Comment: teacher  Tobacco Use  . Smoking status: Never Smoker  . Smokeless tobacco: Never Used  Substance and Sexual Activity  . Alcohol use: No  . Drug use: No  . Sexual activity: Yes    Birth control/protection: Surgical    Comment: hyst

## 2019-09-01 DIAGNOSIS — E7849 Other hyperlipidemia: Secondary | ICD-10-CM | POA: Diagnosis not present

## 2019-09-01 DIAGNOSIS — I10 Essential (primary) hypertension: Secondary | ICD-10-CM | POA: Diagnosis not present

## 2019-09-13 DIAGNOSIS — Z23 Encounter for immunization: Secondary | ICD-10-CM | POA: Diagnosis not present

## 2019-09-23 ENCOUNTER — Telehealth: Payer: Self-pay

## 2019-09-23 NOTE — Telephone Encounter (Signed)
Voicemail: Patient is calling regarding area on her leg that resembles cellulitis from 2020. She has left over medication and would like to know if she can use it.  Return call to patient and she was not available. Left message with husband.    Laverle Patter, RN

## 2019-09-23 NOTE — Telephone Encounter (Signed)
I spoke wit patient and advised an office visit since she has not been in the office in one year.  She was scheduled for Wednesday, September 25, 2019.   Laverle Patter, RN

## 2019-09-25 ENCOUNTER — Encounter: Payer: Self-pay | Admitting: Infectious Diseases

## 2019-09-25 ENCOUNTER — Other Ambulatory Visit: Payer: Self-pay

## 2019-09-25 ENCOUNTER — Telehealth: Payer: Self-pay

## 2019-09-25 ENCOUNTER — Ambulatory Visit (INDEPENDENT_AMBULATORY_CARE_PROVIDER_SITE_OTHER): Payer: Medicare Other | Admitting: Infectious Diseases

## 2019-09-25 DIAGNOSIS — L03116 Cellulitis of left lower limb: Secondary | ICD-10-CM

## 2019-09-25 MED ORDER — LINEZOLID 600 MG PO TABS: 600.0000 mg | ORAL_TABLET | Freq: Two times a day (BID) | ORAL | 1 refills | Status: DC

## 2019-09-25 NOTE — Telephone Encounter (Signed)
Thanks

## 2019-09-25 NOTE — Progress Notes (Signed)
   Subjective:    Patient ID: Caitlin [name], female    DOB: 05/11/1946, 74 y.o.   MRN: [id number]  HPI 74 yo F with hx of vericose veins, prev ablation. L TKR (Dr Durward Fortes), L breast CA (treated with XRT 2017).  She had vericose bleeds which improved with local care.  After her last bleed of her LLE she then developed swelling and pain in her ankle. She was seen in ED and was given NSAID. She returned with worsening pain ~48 h later and was told she had cellulitis. She had MRI done 10-7 that showed: Soft tissue ulcer and cellulitis along the medial aspect of the hindfoot. No marrow signal abnormality indicative of acute osteomyelitis. She was given bactrim but developed hives. She was then changed to doxycycline.  1 week later she developed oozing from a boil on her ankle. She was seen by PCP and was started on augmentin and was seen in [place].  She had wound Cx 06-01-18 MRSA (s- doxy, bactrim, clinda).   She was seen in ID clinic on 06-15-18 and was given oritavancin via pharmacy 06-18-18.  She had repeat MRI 06-18-18 that showed cellulitis but no osteo.  She has not been seen in ID since 08-2018.   She returns today with erythema on her L foot/ankle for the last 2 months. Is improved today.  Improves with elevation, ice pack. Has compression socks ("I hate them").  No fevers or chills.  No COVID. Has had first vaccine (2nd March 12). Has been staying isolated.  Has been taking keflex from urology for UTI (was rx for 1 year). She takes prn, qod. Has been on for the last 2 weeks. Does not have uro sx.   Review of Systems  Constitutional: Negative for chills and fever.  Respiratory: Negative for cough and shortness of breath.   Gastrointestinal: Negative for constipation and diarrhea.  Genitourinary: Negative for difficulty urinating (occas leakage).  Skin: Positive for rash. Negative for wound.  Please see HPI. All other systems reviewed and negative.      Objective:   Physical  Exam Vitals reviewed.  Constitutional:      General: She is not in acute distress.    Appearance: She is not toxic-appearing.  HENT:     Mouth/Throat:     Mouth: Mucous membranes are moist.     Pharynx: No oropharyngeal exudate.  Eyes:     Extraocular Movements: Extraocular movements intact.     Pupils: Pupils are equal, round, and reactive to light.  Cardiovascular:     Rate and Rhythm: Normal rate and regular rhythm.  Pulmonary:     Effort: Pulmonary effort is normal.     Breath sounds: Normal breath sounds.  Abdominal:     General: Bowel sounds are normal. There is no distension.     Palpations: Abdomen is soft.     Tenderness: There is no abdominal tenderness.  Musculoskeletal:     Cervical back: Normal range of motion and neck supple.       Feet:  Neurological:     Mental Status: She is alert.           Assessment & Plan:

## 2019-09-25 NOTE — Telephone Encounter (Signed)
Patient called asking if she should start Linezolid. Per MD notes today No current evidence of cellulitis.   Asked her to;  1) keep leg elevated 2) wear compression hose.  3) will refill her prn linezolid.  Will see her back prn, if worsening. I asked her to call if she takes the lineozolid, will see her asap   Explained this to the patient and sent to her via [facility].   Patient understands to take medication if needed and when she does take it to contact [facility] to schedule appointment with Dr. Johnnye Sima.   Eugenia Mcalpine

## 2019-09-25 NOTE — Assessment & Plan Note (Addendum)
No current evidence of cellulitis.   Asked her to;  1) keep leg elevated 2) wear compression hose.  3) will refill her prn linezolid.  Will see her back prn, if worsening. I asked her to call if she takes the lineozolid, will see her asap

## 2019-09-29 DIAGNOSIS — I10 Essential (primary) hypertension: Secondary | ICD-10-CM | POA: Diagnosis not present

## 2019-09-29 DIAGNOSIS — E7849 Other hyperlipidemia: Secondary | ICD-10-CM | POA: Diagnosis not present

## 2019-10-03 ENCOUNTER — Telehealth: Payer: Self-pay

## 2019-10-03 NOTE — Telephone Encounter (Signed)
Voicemail:  Patient calling with concerns about starting linezolid. She has a few places on her leg she would like an e-mail address to send photos for review by Dr Johnnye Sima before she starts medication.   I advised patient to send photo via my chart.   Laverle Patter, RN

## 2019-10-11 DIAGNOSIS — Z23 Encounter for immunization: Secondary | ICD-10-CM | POA: Diagnosis not present

## 2019-10-14 ENCOUNTER — Telehealth: Payer: Self-pay | Admitting: *Deleted

## 2019-10-14 NOTE — Telephone Encounter (Signed)
Patient feels she has developed Thrush, reports painful white spots in her mouth and on her tongue that does not wipe of. This started Saturday.   Last linezolid dose is 3/15.  Wound looks some what better, but still has an unhealed spot. Please advise. Landis Gandy, RN

## 2019-10-15 ENCOUNTER — Other Ambulatory Visit: Payer: Self-pay | Admitting: Infectious Diseases

## 2019-10-15 DIAGNOSIS — B370 Candidal stomatitis: Secondary | ICD-10-CM

## 2019-10-15 MED ORDER — FLUCONAZOLE 100 MG PO TABS: 100.0000 mg | ORAL_TABLET | Freq: Every day | ORAL | 0 refills | Status: AC

## 2019-10-15 NOTE — Telephone Encounter (Signed)
Sent in rx for diflucan.

## 2019-10-16 NOTE — Telephone Encounter (Signed)
Thanks! Left message to let her know, saw she sent a mychart message that the pharmacy already called her about it.

## 2019-10-24 ENCOUNTER — Ambulatory Visit (INDEPENDENT_AMBULATORY_CARE_PROVIDER_SITE_OTHER): Payer: Medicare Other | Admitting: Infectious Diseases

## 2019-10-24 ENCOUNTER — Encounter: Payer: Self-pay | Admitting: Infectious Diseases

## 2019-10-24 ENCOUNTER — Telehealth: Payer: Self-pay

## 2019-10-24 ENCOUNTER — Other Ambulatory Visit: Payer: Self-pay

## 2019-10-24 VITALS — BP 135/77 | HR 103 | Temp 98.2°F | Wt 215.4 lb

## 2019-10-24 DIAGNOSIS — L03116 Cellulitis of left lower limb: Secondary | ICD-10-CM | POA: Diagnosis not present

## 2019-10-24 DIAGNOSIS — R2242 Localized swelling, mass and lump, left lower limb: Secondary | ICD-10-CM

## 2019-10-24 MED ORDER — CEPHALEXIN 500 MG PO CAPS: 500.0000 mg | ORAL_CAPSULE | Freq: Three times a day (TID) | ORAL | 0 refills | Status: AC

## 2019-10-24 MED ORDER — FLUCONAZOLE 100 MG PO TABS: 100.0000 mg | ORAL_TABLET | Freq: Every day | ORAL | 0 refills | Status: DC

## 2019-10-24 NOTE — Assessment & Plan Note (Signed)
Will give her injection of oritavancin Will have her seen by [facility] (she has prev seen [place], [place]).  Continue her keflex for now til she gets injections.  Will see her back in 2 weeks.

## 2019-10-24 NOTE — Assessment & Plan Note (Addendum)
She has open wound with cellulitis and induration. I am not clear if this is from a vericose vein that has eroded through.  She has previously Seen Dr's Scot Dock and Dr Donnetta Hutching. Will send her back to see them.  Will start her on keflex til she can get dose of oritavancin Will give her rx for fluconazole if she needs.  Will see her back in 2 weeks.

## 2019-10-24 NOTE — Telephone Encounter (Addendum)
Per MD called [facility] short stay to set up x1 infusion for Oritavancin 1.2 g IVPB x1. Left message requesting call back. P: [phone]

## 2019-10-24 NOTE — Progress Notes (Signed)
   Subjective:    Patient ID: Caitlin [name], female    DOB: [birth year], 74 y.o.   MRN: [id number]  HPI 74 yo F with hx of vericose veins, prev ablation. L TKR (Dr Durward Fortes), L breast CA (treated with XRT 2017).  She had vericose bleeds which improved with local care.  After her last bleed of her LLE she then developed swelling and pain in her ankle. She was seen in ED and was given NSAID. She returned with worsening pain ~48 h later and was told she had cellulitis. She had MRI done 10-7 that showed: Soft tissue ulcer and cellulitis along the medial aspect of the hindfoot. No marrow signal abnormality indicative of acute osteomyelitis. She was given bactrim but developed hives. She was then changed to doxycycline.  1 week later she developed oozing from a boil on her ankle. She was seen by PCP and was started on augmentin and was seen in [place].  She had wound Cx 06-01-18 MRSA (s- doxy, bactrim, clinda).  She was seen in ID clinic on 06-15-18 and was given oritavancin via pharmacy 06-18-18.  She had repeat MRI 06-18-18 that showed cellulitis but no osteo.  She has not been seen in ID since 08-2018.   She returned 09-25-19 with erythema on her L foot/ankle for the last 2 months.  Improves with elevation, ice pack. Has compression socks ("I hate them").  Her linezolid was refilled at her visit, she took for 1 week then developed thrush.  She took fluconazole for this.  She has since she has had worsening erythema and induration. She has open, indurated area. No d/c She has had no proximal erythema.  She has a proximal TKR.  No f/c.    Has had both COVID vaccine. Has been staying isolated.    Review of Systems  Constitutional: Negative for chills and fever.  HENT: Positive for mouth sores.   Skin: Positive for wound.  Please see HPI. All other systems reviewed and negative.     Objective:   Physical Exam Constitutional:      Appearance: Normal appearance. She is obese.    Musculoskeletal:     Right lower leg: No edema.     Left lower leg: No edema.       Legs:  Neurological:     Mental Status: She is alert.          Assessment & Plan:

## 2019-10-25 ENCOUNTER — Other Ambulatory Visit: Payer: Self-pay | Admitting: Infectious Diseases

## 2019-10-25 NOTE — Telephone Encounter (Signed)
Received follow up call from Short Stay stating they can schedule appointment for patient. Will need orders faxed before patient could be seen. Will fax orders today.  [place]

## 2019-10-28 ENCOUNTER — Encounter (HOSPITAL_COMMUNITY)
Admission: RE | Admit: 2019-10-28 | Discharge: 2019-10-28 | Disposition: A | Discharge status: Home / Self Care | Payer: Medicare Other | Source: Ambulatory Visit | Attending: Infectious Diseases | Admitting: Infectious Diseases

## 2019-10-28 ENCOUNTER — Encounter (HOSPITAL_COMMUNITY): Payer: Self-pay

## 2019-10-28 ENCOUNTER — Other Ambulatory Visit: Payer: Self-pay

## 2019-10-28 DIAGNOSIS — L03116 Cellulitis of left lower limb: Secondary | ICD-10-CM | POA: Insufficient documentation

## 2019-10-28 MED ORDER — ORITAVANCIN DIPHOSPHATE 400 MG IV SOLR
1200.0000 mg | Freq: Once | INTRAVENOUS | Status: AC
  Administered 2019-10-28: 11:00:00 1200 mg via INTRAVENOUS
  Filled 2019-10-28: qty 120

## 2019-10-28 MED ORDER — SODIUM CHLORIDE 0.9 % IV SOLN: Freq: Once | INTRAVENOUS | Status: AC

## 2019-10-30 DIAGNOSIS — I10 Essential (primary) hypertension: Secondary | ICD-10-CM | POA: Diagnosis not present

## 2019-10-30 DIAGNOSIS — E7849 Other hyperlipidemia: Secondary | ICD-10-CM | POA: Diagnosis not present

## 2019-11-06 ENCOUNTER — Encounter: Payer: Self-pay | Admitting: Vascular Surgery

## 2019-11-06 ENCOUNTER — Ambulatory Visit (INDEPENDENT_AMBULATORY_CARE_PROVIDER_SITE_OTHER): Payer: Medicare Other | Admitting: Vascular Surgery

## 2019-11-06 ENCOUNTER — Other Ambulatory Visit: Payer: Self-pay

## 2019-11-06 VITALS — BP 133/74 | HR 86 | Temp 97.9°F | Resp 20 | Ht 63.0 in | Wt 215.0 lb

## 2019-11-06 DIAGNOSIS — I8003 Phlebitis and thrombophlebitis of superficial vessels of lower extremities, bilateral: Secondary | ICD-10-CM

## 2019-11-06 NOTE — Progress Notes (Signed)
Patient name: Caitlin [name] MRN: [id number] DOB: 08/10/1945 Sex: female  REASON FOR VISIT:   Follow-up of superficial thrombophlebitis.  HPI:   Caitlin [name] is a pleasant 74 y.o. female who I last saw on 12/12/2018.  She had superficial thrombophlebitis of both lower extremities.  These involve the varicose veins in her medial thighs and calves bilaterally.  She was not having a lot of inflammation or pain.  We discussed the importance of leg elevation and I encouraged her to use warm compresses and ibuprofen as needed.  Plan on seeing her back in 6 months.    Since I saw her last she has not been having much in the way of pain in either lower extremity.  She developed 1 small ulceration on her medial left calf where she had phlebitis.  She denies significant aching pain or heaviness in her legs.  She has been wearing her compression stockings.  She tries to elevate her legs some.  Past Medical History:  Diagnosis Date  . Arthritis   . Asthma   . Breast cancer ([place])   . Breast disorder    cancer  . Breast mass    benign  . Cellulitis   . GERD (gastroesophageal reflux disease)   . Heart murmur    AS CHILD  OUTGREW IT  . History of hiatal hernia   . Hyperlipidemia   . Hypertension   . Impaired fasting glucose   . Rectocele   . Varicose vein of leg     Family History  Problem Relation Age of Onset  . Colon cancer Father   . Rectal cancer Father   . Prostate cancer Father   . Dementia Mother   . Osteoporosis Mother   . Healthy Daughter   . Healthy Son   . Pancreatic cancer Paternal Grandfather   . Heart disease Paternal Grandmother   . Heart disease Maternal Grandfather   . Breast cancer Maternal Grandmother   . Scoliosis Sister     SOCIAL HISTORY: Social History   Tobacco Use  . Smoking status: Never Smoker  . Smokeless tobacco: Never Used  Substance Use Topics  . Alcohol use: No    Allergies  Allergen Reactions  . Horse-Derived Products     Reaction as  a child "heart stopped"  . Sulfa Antibiotics Hives  . Sulfamethoxazole-Trimethoprim Rash  . Tetanus Toxoid Adsorbed Palpitations    As a child    Current Outpatient Medications  Medication Sig Dispense Refill  . anastrozole (ARIMIDEX) 1 MG tablet Take 1 tablet (1 mg total) by mouth daily. 90 tablet 4  . B Complex Vitamins (B-COMPLEX/B-12 SL) Place 1 tablet under the tongue daily.     . Budeson-Glycopyrrol-Formoterol (BREZTRI AEROSPHERE) 160-9-4.8 MCG/ACT AERO Inhale 2 puffs into the lungs 2 (two) times daily. 5.9 g 0  . Calcium Citrate-Vitamin D (CALCIUM CITRATE CHEWY BITE) 500-500 MG-UNIT CHEW Chew 500 mg by mouth 2 (two) times daily.     [name] docusate sodium (COLACE) 100 MG capsule Take 100 mg by mouth at bedtime.     [name] esomeprazole (NEXIUM) 20 MG capsule Take 40 mg by mouth daily before breakfast.     . famotidine (PEPCID) 20 MG tablet Take 20 mg by mouth at bedtime.     . fluconazole (DIFLUCAN) 100 MG tablet Take 1 tablet (100 mg total) by mouth daily. 10 tablet 0  . guaiFENesin (MUCINEX) 600 MG 12 hr tablet Take 600 mg by mouth as needed.     [name]  Iron-Vitamin C (IRON 100/C PO) Take 1 tablet by mouth daily. Celebrate iron + vit C    . losartan-hydrochlorothiazide (HYZAAR) 100-25 MG tablet Take 1 tablet by mouth daily.  1  . Magnesium 400 MG CAPS Take 400 mg by mouth every evening.     [name] MELATONIN PO Take 1 tablet by mouth at bedtime as needed (sleep).    . Menaquinone-7 (VITAMIN K2) 100 MCG CAPS Take 100 mcg by mouth daily.     . Multiple Vitamin (MULTIVITAMIN) capsule Take 1 capsule by mouth 2 (two) times daily with a meal. Celebrate Bariatric MVI    . pravastatin (PRAVACHOL) 40 MG tablet Take 40 mg by mouth at bedtime.    . Probiotic Product (PROBIOTIC DAILY PO) Take 1 capsule by mouth daily.    [name] linezolid (ZYVOX) 600 MG tablet Take 1 tablet (600 mg total) by mouth 2 (two) times daily. (Patient not taking: Reported on 10/24/2019) 14 tablet 1   No current facility-administered medications  for this visit.    REVIEW OF SYSTEMS:  [X]  denotes positive finding, [ ]  denotes negative finding Cardiac  Comments:  Chest pain or chest pressure:    Shortness of breath upon exertion:    Short of breath when lying flat:    Irregular heart rhythm:        Vascular    Pain in calf, thigh, or hip brought on by ambulation:    Pain in feet at night that wakes you up from your sleep:     Blood clot in your veins:    Leg swelling:         Pulmonary    Oxygen at home:    Productive cough:     Wheezing:         Neurologic    Sudden weakness in arms or legs:     Sudden numbness in arms or legs:     Sudden onset of difficulty speaking or slurred speech:    Temporary loss of vision in one eye:     Problems with dizziness:         Gastrointestinal    Blood in stool:     Vomited blood:         Genitourinary    Burning when urinating:     Blood in urine:        Psychiatric    Major depression:         Hematologic    Bleeding problems:    Problems with blood clotting too easily:        Skin    Rashes or ulcers:        Constitutional    Fever or chills:     PHYSICAL EXAM:   Vitals:   11/06/19 1341  BP: 133/74  Pulse: 86  Resp: 20  Temp: 97.9 F (36.6 C)  SpO2: 98%  Weight: 215 lb (97.5 kg)  Height: 5\' 3"  (1.6 m)    GENERAL: The patient is a well-nourished female, in no acute distress. The vital signs are documented above. CARDIAC: There is a regular rate and rhythm.  VASCULAR: I do not detect carotid bruits. I looked at both anterior accessory saphenous veins myself with the SonoSite.  The veins are significantly tortuous and quite superficial.  Thus I do not think she is a candidate for laser ablation of the anterior sensory saphenous vein.  She has multiple varicose veins of both lower extremities as documented below.      PULMONARY: There  is good air exchange bilaterally without wheezing or rales. ABDOMEN: Soft and non-tender with normal pitched bowel  sounds.  MUSCULOSKELETAL: There are no major deformities or cyanosis. NEUROLOGIC: No focal weakness or paresthesias are detected. SKIN: There are no ulcers or rashes noted. PSYCHIATRIC: The patient has a normal affect.  DATA:    No new data  MEDICAL ISSUES:   CHRONIC VENOUS DISEASE: The patient is status post laser ablation of both great saphenous veins in the past.  She was considering stab phlebectomies but this was delayed because of Covid.  Now she feels like her symptoms have improved significantly and therefore we will plan on holding off on any further procedures.  We have again discussed the importance of intermittent leg elevation and the proper positioning for this.  I have encouraged her to continue to wear her compression stockings especially when she is on her feet a lot.  I encouraged her to avoid prolonged sitting and standing.  We discussed importance of exercise specifically walking and water aerobics.  I will see her back as needed.  [name] [facility] [phone]

## 2019-11-12 ENCOUNTER — Ambulatory Visit (INDEPENDENT_AMBULATORY_CARE_PROVIDER_SITE_OTHER): Payer: Medicare Other | Admitting: Infectious Diseases

## 2019-11-12 ENCOUNTER — Other Ambulatory Visit: Payer: Self-pay

## 2019-11-12 ENCOUNTER — Encounter: Payer: Self-pay | Admitting: Infectious Diseases

## 2019-11-12 DIAGNOSIS — R2242 Localized swelling, mass and lump, left lower limb: Secondary | ICD-10-CM | POA: Diagnosis not present

## 2019-11-12 NOTE — Assessment & Plan Note (Signed)
She is doing well I asked her to keep the wound covered when she is out of the house, try and keep as clean as possible. Keep elevated when home. Transient submersion only.  I asked her and her husband to call me if any change in swelling/erythema/d/c/heat.  I will be glad to see her ASAP.

## 2019-11-12 NOTE — Progress Notes (Signed)
   Subjective:    Patient ID: Caitlin [name], female    DOB: July 19, 1946, 74 y.o.   MRN: [id number]  HPI 74yo F with hx of vericose veins, prev ablation. L TKR (Dr Durward Fortes), L breast CA (treated with XRT 2017).  She had vericose bleeds which improved with local care.  After her last bleed of her LLE she then developed swelling and pain in her ankle. She was seen in ED and was given NSAID. She returned with worsening pain ~48 h later and was told she had cellulitis. She had MRI done 10-7 that showed: Soft tissue ulcer and cellulitis along the medial aspect of the hindfoot. No marrow signal abnormality indicative of acute osteomyelitis. She was given bactrim but developed hives. She was then changed to doxycycline.  1 week later she developed oozing from a boil on her ankle. She was seen by PCP and was started on augmentin and was seen in [place].  She had wound Cx 06-01-18 MRSA (s- doxy, bactrim, clinda).  She was seen in ID clinic on 11-15-19and was given oritavancin via pharmacy 06-18-18.  She had repeat MRI 11-18-19that showed cellulitis but no osteo.  She has not been seen in ID since 08-2018.   She returned 09-25-19 with erythema on her L foot/ankle for the last 2 months.  She was then seen 3-25. She had a small open wound and given a dose of oritavancin 3-29.  Her course has also been complicated by thrush.   She  Was seen by vascular on 4-7. She has been better at wearing her compression stockings.  She completed 1 week of keflex today.   Has completed COVID vax 4 weeks ago.  She has a proximal TKR.    Review of Systems  Constitutional: Negative for appetite change, chills and fever.  Cardiovascular: Positive for leg swelling.  Skin: Positive for wound.       Leg has not been hot.   Please see HPI. All other systems reviewed and negative.      Objective:   Physical Exam Constitutional:      General: She is not in acute distress.    Appearance: She is obese. She is  not ill-appearing, toxic-appearing or diaphoretic.  Skin:      Neurological:     Mental Status: She is alert.           Assessment & Plan:

## 2019-11-18 ENCOUNTER — Other Ambulatory Visit (HOSPITAL_COMMUNITY): Payer: Self-pay | Admitting: Nurse Practitioner

## 2019-11-18 DIAGNOSIS — Z9889 Other specified postprocedural states: Secondary | ICD-10-CM

## 2019-11-29 DIAGNOSIS — I10 Essential (primary) hypertension: Secondary | ICD-10-CM | POA: Diagnosis not present

## 2019-11-29 DIAGNOSIS — C50211 Malignant neoplasm of upper-inner quadrant of right female breast: Secondary | ICD-10-CM | POA: Diagnosis not present

## 2019-11-29 DIAGNOSIS — E7849 Other hyperlipidemia: Secondary | ICD-10-CM | POA: Diagnosis not present

## 2019-11-29 DIAGNOSIS — J4540 Moderate persistent asthma, uncomplicated: Secondary | ICD-10-CM | POA: Diagnosis not present

## 2019-12-03 ENCOUNTER — Ambulatory Visit (HOSPITAL_COMMUNITY)
Admission: RE | Admit: 2019-12-03 | Discharge: 2019-12-03 | Disposition: A | Discharge status: Home / Self Care | Payer: Medicare Other | Source: Ambulatory Visit | Attending: Nurse Practitioner | Admitting: Nurse Practitioner

## 2019-12-03 ENCOUNTER — Ambulatory Visit (HOSPITAL_COMMUNITY): Payer: Medicare Other | Admitting: Nurse Practitioner

## 2019-12-03 ENCOUNTER — Inpatient Hospital Stay (HOSPITAL_COMMUNITY): Payer: Medicare Other | Attending: Hematology

## 2019-12-03 ENCOUNTER — Other Ambulatory Visit: Payer: Self-pay

## 2019-12-03 ENCOUNTER — Encounter (HOSPITAL_COMMUNITY): Payer: Medicare Other

## 2019-12-03 DIAGNOSIS — Z9889 Other specified postprocedural states: Secondary | ICD-10-CM

## 2019-12-03 DIAGNOSIS — M8580 Other specified disorders of bone density and structure, unspecified site: Secondary | ICD-10-CM | POA: Diagnosis not present

## 2019-12-03 DIAGNOSIS — Z79811 Long term (current) use of aromatase inhibitors: Secondary | ICD-10-CM | POA: Diagnosis not present

## 2019-12-03 DIAGNOSIS — Z8042 Family history of malignant neoplasm of prostate: Secondary | ICD-10-CM | POA: Insufficient documentation

## 2019-12-03 DIAGNOSIS — Z923 Personal history of irradiation: Secondary | ICD-10-CM | POA: Diagnosis not present

## 2019-12-03 DIAGNOSIS — C50211 Malignant neoplasm of upper-inner quadrant of right female breast: Secondary | ICD-10-CM | POA: Insufficient documentation

## 2019-12-03 DIAGNOSIS — R922 Inconclusive mammogram: Secondary | ICD-10-CM | POA: Diagnosis not present

## 2019-12-03 DIAGNOSIS — Z853 Personal history of malignant neoplasm of breast: Secondary | ICD-10-CM | POA: Diagnosis not present

## 2019-12-03 DIAGNOSIS — Z808 Family history of malignant neoplasm of other organs or systems: Secondary | ICD-10-CM | POA: Diagnosis not present

## 2019-12-03 DIAGNOSIS — Z800 Family history of malignant neoplasm of digestive organs: Secondary | ICD-10-CM | POA: Diagnosis not present

## 2019-12-03 DIAGNOSIS — Z170 Estrogen receptor positive status [ER+]: Secondary | ICD-10-CM | POA: Insufficient documentation

## 2019-12-03 LAB — CBC WITH DIFFERENTIAL/PLATELET
Abs Immature Granulocytes: 0.01 10*3/uL (ref 0.00–0.07)
Basophils Absolute: 0.1 10*3/uL (ref 0.0–0.1)
Basophils Relative: 1 %
Eosinophils Absolute: 0.4 10*3/uL (ref 0.0–0.5)
Eosinophils Relative: 8 %
HCT: 36.5 % (ref 36.0–46.0)
Hemoglobin: 12 g/dL (ref 12.0–15.0)
Immature Granulocytes: 0 %
Lymphocytes Relative: 25 %
Lymphs Abs: 1.4 10*3/uL (ref 0.7–4.0)
MCH: 34 pg (ref 26.0–34.0)
MCHC: 32.9 g/dL (ref 30.0–36.0)
MCV: 103.4 fL — ABNORMAL HIGH (ref 80.0–100.0)
Monocytes Absolute: 0.7 10*3/uL (ref 0.1–1.0)
Monocytes Relative: 13 %
Neutro Abs: 2.8 10*3/uL (ref 1.7–7.7)
Neutrophils Relative %: 53 %
Platelets: 264 10*3/uL (ref 150–400)
RBC: 3.53 MIL/uL — ABNORMAL LOW (ref 3.87–5.11)
RDW: 12.6 % (ref 11.5–15.5)
WBC: 5.4 10*3/uL (ref 4.0–10.5)
nRBC: 0 % (ref 0.0–0.2)

## 2019-12-03 LAB — COMPREHENSIVE METABOLIC PANEL
ALT: 32 U/L (ref 0–44)
AST: 46 U/L — ABNORMAL HIGH (ref 15–41)
Albumin: 3.9 g/dL (ref 3.5–5.0)
Alkaline Phosphatase: 127 U/L — ABNORMAL HIGH (ref 38–126)
Anion gap: 12 (ref 5–15)
BUN: 12 mg/dL (ref 8–23)
CO2: 26 mmol/L (ref 22–32)
Calcium: 9.3 mg/dL (ref 8.9–10.3)
Chloride: 93 mmol/L — ABNORMAL LOW (ref 98–111)
Creatinine, Ser: 0.61 mg/dL (ref 0.44–1.00)
GFR calc Af Amer: >60 mL/min (ref >60)
GFR calc non Af Amer: >60 mL/min (ref >60)
Glucose, Bld: 107 mg/dL — ABNORMAL HIGH (ref 70–99)
Potassium: 4.2 mmol/L (ref 3.5–5.1)
Sodium: 131 mmol/L — ABNORMAL LOW (ref 135–145)
Total Bilirubin: 0.8 mg/dL (ref 0.3–1.2)
Total Protein: 6.9 g/dL (ref 6.5–8.1)

## 2019-12-03 LAB — LACTATE DEHYDROGENASE: LDH: 139 U/L (ref 98–192)

## 2019-12-03 LAB — VITAMIN D 25 HYDROXY (VIT D DEFICIENCY, FRACTURES): Vit D, 25-Hydroxy: 43.48 ng/mL (ref 30–100)

## 2019-12-03 LAB — VITAMIN B12: Vitamin B-12: 1408 pg/mL — ABNORMAL HIGH (ref 180–914)

## 2019-12-04 ENCOUNTER — Ambulatory Visit (INDEPENDENT_AMBULATORY_CARE_PROVIDER_SITE_OTHER): Payer: Medicare Other | Admitting: Orthopaedic Surgery

## 2019-12-04 ENCOUNTER — Encounter: Payer: Self-pay | Admitting: Orthopaedic Surgery

## 2019-12-04 DIAGNOSIS — M19011 Primary osteoarthritis, right shoulder: Secondary | ICD-10-CM | POA: Diagnosis not present

## 2019-12-04 NOTE — Progress Notes (Signed)
Office Visit Note   Patient: Caitlin [name]           Date of Birth: 06/23/1946           MRN: [id number] Visit Date: 12/04/2019              Requested by: Sharilyn Sites, [address] [place],  [place] [address] PCP: Sharilyn Sites, MD   Assessment & Plan: Visit Diagnoses:  1. Primary osteoarthritis, right shoulder     Plan: Mr She is at the point of compromise and wishes to discuss shoulder replacement.  She had an MRI scan of her shoulder in May 2020 demonstrating moderate glenohumeral joint effusion and severe degenerative arthropathy.  She had moderate to advanced anterior supraspinatus tendinopathy due to impingement from the distal clavicle but no evidence of a full-thickness tear s. Caitlin [name] is accompanied by her husband and here for follow-up evaluation of the osteoarthritis of her right shoulder.  She has had several cortisone injections with temporary relief of her pain.  Rather than proceed with cortisone injection we will refer to Dr. Marlou Sa and schedule a thin slice CT scan in anticipation of shoulder replacement surgery  Follow-Up Instructions: Return Will obtain thin slice CT scan right shoulder refer to Dr. Marlou Sa for shoulder replacement.   Orders:  No orders of the defined types were placed in this encounter.  No orders of the defined types were placed in this encounter.     Procedures: No procedures performed   Clinical Data: No additional findings.   Subjective: Chief Complaint  Patient presents with  . Right Shoulder - Pain  Patient presents today for recurrent right shoulder pain. She was here last in January and received a cortisone injection. She said that the injection helps for a month and then the pain returns. She is wanting to get another injection today. She takes Aleve as needed.   HPI  Review of Systems   Objective: Vital Signs: Ht 5\' 3"  (1.6 m)   Wt 210 lb (95.3 kg)   BMI 37.20 kg/m   Physical Exam Constitutional:    Appearance: She is well-developed.  Eyes:     Pupils: Pupils are equal, round, and reactive to light.  Pulmonary:     Effort: Pulmonary effort is normal.  Skin:    General: Skin is warm and dry.  Neurological:     Mental Status: She is alert and oriented to person, place, and time.  Psychiatric:        Behavior: Behavior normal.     Ortho Exam awake alert and oriented x3.  Comfortable sitting.  Painful range of motion of right shoulder with grinding and crepitation.  I could fully flex about 60 degrees but she was very uncomfortable I can abduct about 80 degrees.  Pain with internal and external rotation.  No erythema or ecchymosis.  Neurologically intact  Specialty Comments:  No specialty comments available.  Imaging: MM DIAG BREAST TOMO BILATERAL  Result Date: 12/03/2019 CLINICAL DATA:  74 year old female with history right breast cancer post lumpectomy 11/11/2015. EXAM: DIGITAL DIAGNOSTIC BILATERAL MAMMOGRAM WITH CAD AND TOMO COMPARISON:  Previous exams. ACR Breast Density Category c: The breast tissue is heterogeneously dense, which may obscure small masses. FINDINGS: No suspicious masses or calcifications are seen in either breast. Lumpectomy changes again identified within the inner posterior right breast. Spot compression magnification MLO view of the right breast lumpectomy site was performed. There is no mammographic evidence of locally recurrent malignancy. Mammographic images were  processed with CAD. IMPRESSION: No mammographic evidence of malignancy in either breast. RECOMMENDATION: Diagnostic mammogram is suggested in 1 year. (Code:DM-B-01Y) I have discussed the findings and recommendations with the patient. If applicable, a reminder letter will be sent to the patient regarding the next appointment. BI-RADS CATEGORY  2: Benign. Electronically Signed   By: Everlean Alstrom M.D.   On: 12/03/2019 11:09     PMFS History: Patient Active Problem List   Diagnosis Date Noted  .  Primary osteoarthritis, right shoulder 12/04/2019  . Chronic right shoulder pain 11/29/2018  . Localized swelling of left lower extremity 07/19/2018  . Cellulitis of left ankle 06/15/2018  . Unilateral primary osteoarthritis, left knee 05/02/2017  . S/P total knee replacement using cement, left 05/02/2017  . Primary osteoarthritis of left knee 12/21/2016  . Chondromalacia patellae, left knee 05/26/2016  . Breast cancer of upper-inner quadrant of right female breast ([place]) 11/04/2015  . GERD (gastroesophageal reflux disease) 09/02/2013  . Obesity 09/02/2013  . Ventral hernia, recurrent 09/02/2013  . Unspecified asthma(493.90) 12/12/2012  . Cough variant asthma 03/27/2012  . Right middle lobe syndrome 03/27/2012   Past Medical History:  Diagnosis Date  . Arthritis   . Asthma   . Breast cancer ([place])   . Breast disorder    cancer  . Breast mass    benign  . Cellulitis   . GERD (gastroesophageal reflux disease)   . Heart murmur    AS CHILD  OUTGREW IT  . History of hiatal hernia   . Hyperlipidemia   . Hypertension   . Impaired fasting glucose   . Rectocele   . Varicose vein of leg     Family History  Problem Relation Age of Onset  . Colon cancer Father   . Rectal cancer Father   . Prostate cancer Father   . Dementia Mother   . Osteoporosis Mother   . Healthy Daughter   . Healthy Son   . Pancreatic cancer Paternal Grandfather   . Heart disease Paternal Grandmother   . Heart disease Maternal Grandfather   . Breast cancer Maternal Grandmother   . Scoliosis Sister     Past Surgical History:  Procedure Laterality Date  . BREAST LUMPECTOMY WITH RADIOACTIVE SEED AND SENTINEL LYMPH NODE BIOPSY Right 11/11/2015   Procedure: RIGHT BREAST RADIOACTIVE SEED GUIDED  LUMPECTOMY AND RADIOACTIVE SEED TARGETED SENTINEL LYMPH NODE BIOPSY;  Surgeon: Stark [place], MD;  Location: [facility];  Service: General;  Laterality: Right;  . BREAST SURGERY Left    breast biopies x4    . CHOLECYSTECTOMY  09/08/2010  . COLONOSCOPY    . COLONOSCOPY N/A 09/25/2013   Procedure: COLONOSCOPY;  Surgeon: Rogene Houston, MD;  [facility];  Service: Endoscopy;  Laterality: N/A;  100-moved to 1200 Ann to notify pt  . HERNIA REPAIR  12/24/2010, 12-2014   TOTAL 4  . LAPAROSCOPIC GASTRIC SLEEVE RESECTION  11-2013   at [place]  2007/2008  . TONSILLECTOMY  1968  . TOTAL KNEE ARTHROPLASTY Left 05/02/2017  . TOTAL KNEE ARTHROPLASTY Left 05/02/2017   Procedure: LEFT TOTAL KNEE ARTHROPLASTY;  Surgeon: [name], MD;  Location: [facility];  Service: Orthopedics;  Laterality: Left;  . UPPER GASTROINTESTINAL ENDOSCOPY    . VAGINAL HYSTERECTOMY  1980s   Social History   Occupational History  . Occupation: retired    Fish farm manager: RETIRED    Comment: teacher  Tobacco Use  . Smoking status: Never  Smoker  . Smokeless tobacco: Never Used  Substance and Sexual Activity  . Alcohol use: No  . Drug use: No  . Sexual activity: Yes    Birth control/protection: Surgical    Comment: hyst

## 2019-12-04 NOTE — Addendum Note (Signed)
Addended by: Lendon Collar on: 12/04/2019 03:12 PM   Modules accepted: Orders

## 2019-12-05 NOTE — Addendum Note (Signed)
Addended by: Lendon Collar on: 12/05/2019 12:55 PM   Modules accepted: Orders

## 2019-12-10 ENCOUNTER — Other Ambulatory Visit: Payer: Self-pay

## 2019-12-10 ENCOUNTER — Inpatient Hospital Stay (HOSPITAL_BASED_OUTPATIENT_CLINIC_OR_DEPARTMENT_OTHER): Payer: Medicare Other | Admitting: Nurse Practitioner

## 2019-12-10 VITALS — BP 124/58 | HR 78 | Temp 97.8°F | Resp 18 | Wt 217.7 lb

## 2019-12-10 DIAGNOSIS — M8580 Other specified disorders of bone density and structure, unspecified site: Secondary | ICD-10-CM | POA: Diagnosis not present

## 2019-12-10 DIAGNOSIS — Z1231 Encounter for screening mammogram for malignant neoplasm of breast: Secondary | ICD-10-CM

## 2019-12-10 DIAGNOSIS — C50211 Malignant neoplasm of upper-inner quadrant of right female breast: Secondary | ICD-10-CM | POA: Diagnosis not present

## 2019-12-10 DIAGNOSIS — Z923 Personal history of irradiation: Secondary | ICD-10-CM | POA: Diagnosis not present

## 2019-12-10 DIAGNOSIS — Z79811 Long term (current) use of aromatase inhibitors: Secondary | ICD-10-CM | POA: Diagnosis not present

## 2019-12-10 DIAGNOSIS — Z170 Estrogen receptor positive status [ER+]: Secondary | ICD-10-CM | POA: Diagnosis not present

## 2019-12-10 DIAGNOSIS — Z808 Family history of malignant neoplasm of other organs or systems: Secondary | ICD-10-CM | POA: Diagnosis not present

## 2019-12-10 NOTE — Assessment & Plan Note (Signed)
1.  Stage I right breast cancer: - Patient had her regular screening mammogram on 10/05/2015 which was BI-RADS Category 0 incomplete. - She had a diagnostic mammogram on 10/20/2015 which was BI-RADS Category 4 suspicious.  It showed a 5 mm right breast mass at the 1 o'clock position. -They set her up for an ultrasound guided biopsy of the mass on 10/27/2015.  It showed invasive ductal carcinoma and was ER/PR 100% positive.  HER-2 negative.  Ki-67 at 3%. - She had a lumpectomy and sentinel node biopsy with Dr. Barry Dienes on 11/11/2015. -She was treated with adjuvant radiation. -Her Oncotype evaluation placed her in the low risk category. - She was placed on Arimidex 5/2017she will continue this for 5 years.  She will stop Arimidex at the end of May 2022. - Her last mammogram on 12/03/2019 which showed B RADS category 2 benign follow-up in 1 year. -Labs done on 12/03/2019 were all WNL - She will follow-up in 1 year with repeat labs, mammogram, and DEXA scan  2.  Osteopenia: - She had a bone density test on 10/23/2017 - It showed a T score of -1.4 which is osteopenia. -She is taking calcium and vitamin D daily as recommended. -We will set up her DEXA scan at her next visit.

## 2019-12-10 NOTE — Progress Notes (Signed)
[facility] [place], [place] [address]   CLINIC:  Medical Oncology/Hematology  PCP:  Sharilyn Sites, [place] [address] [phone]   REASON FOR VISIT: Follow-up for breast cancer  CURRENT THERAPY: Anastrozole  BRIEF ONCOLOGIC HISTORY:  Oncology History  Breast cancer of upper-inner quadrant of right female breast ([place])  09/24/2014 Imaging   DEXA with normal bone density   10/06/2015 Imaging   Screening bilateral mammogram BI-RADS Category 0, R breast possible mass, L breast possible mass   10/20/2015 Imaging   Diagnostic B mammogram, R breast Ultrasound with suspicious mass in the R breast at the 1 o clock location 5 cm from the nipple, LN in the low R axillae with cortex nodularity   10/27/2015 Initial Biopsy   Ultrasound guided biopsy of R axillary LN, BENIGN. Ultrasound guided biopsy of R breast with invasive ductal carcinoma   11/04/2015 Initial Diagnosis   Breast cancer of upper-inner quadrant of right female breast ([place])   11/09/2015 Procedure   Radioactive seed localization R breast   11/11/2015 Surgery   R breast radioactive seed localized lumpectomy and seed targeted sentinel LN biopsy   11/11/2015 Pathology Results   Invasive ductal carcinoma Grade I/III spanning 1.1 cm, lobular neoplasia (LCIS) resection margins negative. 3 negative sentinel nodes ER (100%), PR (100%), Her2 neu negative pT1c, pN0   12/11/2015 Oncotype testing   Recurrence Score result 11, 10 year risk of distant recurrence with tamoxifen alone 8% (low-risk)   01/11/2016 - 02/04/2016 Radiation Therapy   Adjuvant breast radiation ([facility]): 42.72 Gy in 16 fractions to the right breast   01/2016 -  Anti-estrogen oral therapy   Anastrazole 1 mg daily. Planned duration of therapy: 5 years.      CANCER STAGING: Cancer Staging Breast cancer of upper-inner quadrant of right female breast [facility]) Staging form: Breast, AJCC 7th Edition -  Pathologic stage from 11/11/2015: Stage IA (T1c, N0, cM0) - Signed by Holley Bouche, NP on 04/18/2016    INTERVAL HISTORY:  Caitlin [name] 74 y.o. female returns for routine follow-up for breast cancer.  Patient reports she is doing well since her last visit.  She denies any new lumps or bumps present.  She denies any new adenopathy. Denies any nausea, vomiting, or diarrhea. Denies any new pains. Had not noticed any recent bleeding such as epistaxis, hematuria or hematochezia. Denies recent chest pain on exertion, shortness of breath on minimal exertion, pre-syncopal episodes, or palpitations. Denies any numbness or tingling in hands or feet. Denies any recent fevers, infections, or recent hospitalizations. Patient reports appetite at 100% and energy level at 100%.  She is eating well maintain her weight at this time.    REVIEW OF SYSTEMS:  Review of Systems  All other systems reviewed and are negative.    PAST MEDICAL/SURGICAL HISTORY:  Past Medical History:  Diagnosis Date  . Arthritis   . Asthma   . Breast cancer ([place])   . Breast disorder    cancer  . Breast mass    benign  . Cellulitis   . GERD (gastroesophageal reflux disease)   . Heart murmur    AS CHILD  OUTGREW IT  . History of hiatal hernia   . Hyperlipidemia   . Hypertension   . Impaired fasting glucose   . Rectocele   . Varicose vein of leg    Past Surgical History:  Procedure Laterality Date  . BREAST LUMPECTOMY WITH RADIOACTIVE SEED AND SENTINEL LYMPH  NODE BIOPSY Right 11/11/2015   Procedure: RIGHT BREAST RADIOACTIVE SEED GUIDED  LUMPECTOMY AND RADIOACTIVE SEED TARGETED SENTINEL LYMPH NODE BIOPSY;  Surgeon: Stark Klein, MD;  Location: [facility];  Service: General;  Laterality: Right;  . BREAST SURGERY Left    breast biopies x4  . CHOLECYSTECTOMY  09/08/2010  . COLONOSCOPY    . COLONOSCOPY N/A 09/25/2013   Procedure: COLONOSCOPY;  Surgeon: Rogene Houston, MD;  [facility];  Service:  Endoscopy;  Laterality: N/A;  100-moved to 1200 Ann to notify pt  . HERNIA REPAIR  12/24/2010, 12-2014   TOTAL 4  . LAPAROSCOPIC GASTRIC SLEEVE RESECTION  11-2013   at [place]  2007/2008  . TONSILLECTOMY  1968  . TOTAL KNEE ARTHROPLASTY Left 05/02/2017  . TOTAL KNEE ARTHROPLASTY Left 05/02/2017   Procedure: LEFT TOTAL KNEE ARTHROPLASTY;  Surgeon: [name], MD;  Location: [facility];  Service: Orthopedics;  Laterality: Left;  . UPPER GASTROINTESTINAL ENDOSCOPY    . VAGINAL HYSTERECTOMY  1980s     SOCIAL HISTORY:  Social History   Socioeconomic History  . Marital status: Married    Spouse name: Not on file  . Number of children: 2  . Years of education: Not on file  . Highest education level: Not on file  Occupational History  . Occupation: retired    Fish farm manager: RETIRED    Comment: teacher  Tobacco Use  . Smoking status: Never Smoker  . Smokeless tobacco: Never Used  Substance and Sexual Activity  . Alcohol use: No  . Drug use: No  . Sexual activity: Yes    Birth control/protection: Surgical    Comment: hyst  Other Topics Concern  . Not on file  Social History Narrative  . Not on file   Social Determinants of Health   Financial Resource Strain:   . Difficulty of Paying Living Expenses:   Food Insecurity:   . Worried About Charity fundraiser in the Last Year:   . Arboriculturist in the Last Year:   Transportation Needs:   . Film/video editor (Medical):   [name] Lack of Transportation (Non-Medical):   Physical Activity:   . Days of Exercise per Week:   . Minutes of Exercise per Session:   Stress:   . Feeling of Stress :   Social Connections:   . Frequency of Communication with Friends and Family:   . Frequency of Social Gatherings with Friends and Family:   . Attends Religious Services:   . Active Member of Clubs or Organizations:   . Attends Archivist Meetings:   [name] Marital Status:   Intimate Partner  Violence:   . Fear of Current or Ex-Partner:   . Emotionally Abused:   [name] Physically Abused:   . Sexually Abused:     FAMILY HISTORY:  Family History  Problem Relation Age of Onset  . Colon cancer Father   . Rectal cancer Father   . Prostate cancer Father   . Dementia Mother   . Osteoporosis Mother   . Healthy Daughter   . Healthy Son   . Pancreatic cancer Paternal Grandfather   . Heart disease Paternal Grandmother   . Heart disease Maternal Grandfather   . Breast cancer Maternal Grandmother   . Scoliosis Sister     CURRENT MEDICATIONS:  Outpatient Encounter Medications as of 12/10/2019  Medication Sig  . anastrozole (ARIMIDEX) 1 MG tablet Take 1 tablet (1  mg total) by mouth daily.  . B Complex Vitamins (B-COMPLEX/B-12 SL) Place 1 tablet under the tongue daily.   . Budeson-Glycopyrrol-Formoterol (BREZTRI AEROSPHERE) 160-9-4.8 MCG/ACT AERO Inhale 2 puffs into the lungs 2 (two) times daily.  . Calcium Citrate-Vitamin D (CALCIUM CITRATE CHEWY BITE) 500-500 MG-UNIT CHEW Chew 500 mg by mouth 2 (two) times daily.   [name] docusate sodium (COLACE) 100 MG capsule Take 100 mg by mouth at bedtime.   [name] esomeprazole (NEXIUM) 20 MG capsule Take 40 mg by mouth daily before breakfast.   . famotidine (PEPCID) 20 MG tablet Take 20 mg by mouth at bedtime.   . Iron-Vitamin C (IRON 100/C PO) Take 1 tablet by mouth daily. Celebrate iron + vit C  . losartan-hydrochlorothiazide (HYZAAR) 100-25 MG tablet Take 1 tablet by mouth daily.  . Magnesium 400 MG CAPS Take 400 mg by mouth every evening.   . Menaquinone-7 (VITAMIN K2) 100 MCG CAPS Take 100 mcg by mouth daily.   . Multiple Vitamin (MULTIVITAMIN) capsule Take 1 capsule by mouth 2 (two) times daily with a meal. Celebrate Bariatric MVI  . pravastatin (PRAVACHOL) 40 MG tablet Take 40 mg by mouth at bedtime.  . Probiotic Product (PROBIOTIC DAILY PO) Take 1 capsule by mouth daily.  . cephALEXin (KEFLEX) 250 MG capsule Take 250 mg by mouth as needed.   .  fluconazole (DIFLUCAN) 100 MG tablet Take 1 tablet (100 mg total) by mouth daily. (Patient taking differently: Take 100 mg by mouth as needed. )  . guaiFENesin (MUCINEX) 600 MG 12 hr tablet Take 600 mg by mouth as needed.   [name] MELATONIN PO Take 1 tablet by mouth at bedtime as needed (sleep).   No facility-administered encounter medications on file as of 12/10/2019.    ALLERGIES:  Allergies  Allergen Reactions  . Horse-Derived Products     Reaction as a child "heart stopped"  . Sulfa Antibiotics Hives  . Sulfamethoxazole-Trimethoprim Rash  . Tetanus Toxoid Adsorbed Palpitations    As a child     PHYSICAL EXAM:  ECOG Performance status: 1  Vitals:   12/10/19 1148  BP: (!) 124/58  Pulse: 78  Resp: 18  Temp: 97.8 F (36.6 C)  SpO2: 100%   Filed Weights   12/10/19 1148  Weight: 217 lb 11.2 oz (98.7 kg)   Physical Exam Constitutional:      Appearance: Normal appearance. She is normal weight.  Cardiovascular:     Rate and Rhythm: Normal rate and regular rhythm.     Heart sounds: Normal heart sounds.  Pulmonary:     Effort: Pulmonary effort is normal.     Breath sounds: Normal breath sounds.  Abdominal:     General: Bowel sounds are normal.     Palpations: Abdomen is soft.  Musculoskeletal:        General: Normal range of motion.  Skin:    General: Skin is warm.  Neurological:     Mental Status: She is alert and oriented to person, place, and time. Mental status is at baseline.  Psychiatric:        Mood and Affect: Mood normal.        Behavior: Behavior normal.        Thought Content: Thought content normal.        Judgment: Judgment normal.   Breast: No palpable masses, no skin changes or nipple discharge, no adenopathy.  She does have some lymphedema on the underside of her right breast.   LABORATORY DATA:  I  have reviewed the labs as listed.  CBC    Component Value Date/Time   WBC 5.4 12/03/2019 1113   RBC 3.53 (L) 12/03/2019 1113   HGB 12.0 12/03/2019  1113   HCT 36.5 12/03/2019 1113   PLT 264 12/03/2019 1113   MCV 103.4 (H) 12/03/2019 1113   MCH 34.0 12/03/2019 1113   MCHC 32.9 12/03/2019 1113   RDW 12.6 12/03/2019 1113   LYMPHSABS 1.4 12/03/2019 1113   MONOABS 0.7 12/03/2019 1113   EOSABS 0.4 12/03/2019 1113   BASOSABS 0.1 12/03/2019 1113   CMP Latest Ref Rng & Units 12/03/2019 05/20/2019 11/27/2018  Glucose 70 - 99 mg/dL 107(H) 110(H) 93  BUN 8 - 23 mg/dL 12 10 14   Creatinine 0.44 - 1.00 mg/dL 0.61 0.61 0.67  Sodium 135 - 145 mmol/L 131(L) 130(L) 135  Potassium 3.5 - 5.1 mmol/L 4.2 3.7 3.9  Chloride 98 - 111 mmol/L 93(L) 92(L) 99  CO2 22 - 32 mmol/L 26 27 26   Calcium 8.9 - 10.3 mg/dL 9.3 9.1 9.3  Total Protein 6.5 - 8.1 g/dL 6.9 6.5 7.1  Total Bilirubin 0.3 - 1.2 mg/dL 0.8 0.8 0.8  Alkaline Phos 38 - 126 U/L 127(H) 92 108  AST 15 - 41 U/L 46(H) 38 51(H)  ALT 0 - 44 U/L 32 38 39    DIAGNOSTIC IMAGING:  I have independently reviewed the mammogram scans and discussed with the patient.  ASSESSMENT & PLAN:  Breast cancer of upper-inner quadrant of right female breast ([place]) 1.  Stage I right breast cancer: - Patient had her regular screening mammogram on 10/05/2015 which was BI-RADS Category 0 incomplete. - She had a diagnostic mammogram on 10/20/2015 which was BI-RADS Category 4 suspicious.  It showed a 5 mm right breast mass at the 1 o'clock position. -They set her up for an ultrasound guided biopsy of the mass on 10/27/2015.  It showed invasive ductal carcinoma and was ER/PR 100% positive.  HER-2 negative.  Ki-67 at 3%. - She had a lumpectomy and sentinel node biopsy with Dr. Barry Dienes on 11/11/2015. -She was treated with adjuvant radiation. -Her Oncotype evaluation placed her in the low risk category. - She was placed on Arimidex 5/2017she will continue this for 5 years.  She will stop Arimidex at the end of May 2022. - Her last mammogram on 12/03/2019 which showed B RADS category 2 benign follow-up in 1 year. -Labs done on 12/03/2019  were all WNL - She will follow-up in 1 year with repeat labs, mammogram, and DEXA scan  2.  Osteopenia: - She had a bone density test on 10/23/2017 - It showed a T score of -1.4 which is osteopenia. -She is taking calcium and vitamin D daily as recommended. -We will set up her DEXA scan at her next visit.     Orders placed this encounter:  Orders Placed This Encounter  Procedures  . MM Digital Screening  . DG Bone Density  . Lactate dehydrogenase  . CBC with Differential/Platelet  . Comprehensive metabolic panel  . Vitamin B12  . VITAMIN D 25 Hydroxy (Vit-D Deficiency, Fractures)      Francene Finders FNP-C [place] [phone]

## 2019-12-16 MED ORDER — CEPHALEXIN 500 MG PO CAPS: 500.0000 mg | ORAL_CAPSULE | Freq: Four times a day (QID) | ORAL | 0 refills | Status: AC

## 2019-12-17 ENCOUNTER — Encounter: Payer: Self-pay | Admitting: Infectious Diseases

## 2019-12-17 ENCOUNTER — Ambulatory Visit (HOSPITAL_COMMUNITY)
Admission: RE | Admit: 2019-12-17 | Discharge: 2019-12-17 | Disposition: A | Discharge status: Home / Self Care | Payer: Medicare Other | Source: Ambulatory Visit | Attending: Orthopaedic Surgery | Admitting: Orthopaedic Surgery

## 2019-12-17 ENCOUNTER — Other Ambulatory Visit: Payer: Self-pay

## 2019-12-17 ENCOUNTER — Ambulatory Visit (INDEPENDENT_AMBULATORY_CARE_PROVIDER_SITE_OTHER): Payer: Medicare Other | Admitting: Infectious Diseases

## 2019-12-17 ENCOUNTER — Telehealth: Payer: Self-pay

## 2019-12-17 DIAGNOSIS — M19011 Primary osteoarthritis, right shoulder: Secondary | ICD-10-CM | POA: Insufficient documentation

## 2019-12-17 DIAGNOSIS — L03116 Cellulitis of left lower limb: Secondary | ICD-10-CM

## 2019-12-17 DIAGNOSIS — L02416 Cutaneous abscess of left lower limb: Secondary | ICD-10-CM

## 2019-12-17 DIAGNOSIS — M25511 Pain in right shoulder: Secondary | ICD-10-CM

## 2019-12-17 DIAGNOSIS — G8929 Other chronic pain: Secondary | ICD-10-CM | POA: Diagnosis not present

## 2019-12-17 DIAGNOSIS — M1712 Unilateral primary osteoarthritis, left knee: Secondary | ICD-10-CM | POA: Diagnosis not present

## 2019-12-17 MED ORDER — ORITAVANCIN DIPHOSPHATE 400 MG IV SOLR: 1200.0000 mg | Freq: Once | INTRAVENOUS | 0 refills | Status: AC

## 2019-12-17 NOTE — Progress Notes (Signed)
Subjective:    Patient ID: Caitlin [name], female    DOB: 09-Jan-1946, 74 y.o.   MRN: [id number]  HPI 74yo F with hx of vericose veins, prev ablation. L TKR (Dr Durward Fortes), L breast CA (treated with XRT 2017).  She had vericose bleeds which improved with local care.  After her last bleed of her LLE she then developed swelling and pain in her ankle. She was seen in ED and was given NSAID. She returned with worsening pain ~48 h later and was told she had cellulitis. She had MRI done 10-7 that showed: Soft tissue ulcer and cellulitis along the medial aspect of the hindfoot. No marrow signal abnormality indicative of acute osteomyelitis. She was given bactrim but developed hives. She was then changed to doxycycline.  1 week later she developed oozing from a boil on her ankle. She was seen by PCP and was started on augmentin and was seen in [place].  She had wound Cx 06-01-18 MRSA (s- doxy, bactrim, clinda).  She was seen in ID clinic on 11-15-19and was given oritavancin via pharmacy 06-18-18.  She had repeat MRI 11-18-19that showed cellulitis but no osteo.  She has not been seen in ID since 08-2018.   She returned 2-24-21with erythema on her L foot/ankle for the last 2 months.  She was then seen 3-25. She had a small open wound and given a dose of oritavancin 3-29.  Her course has also been complicated by thrush.   She  Was seen by vascular on 4-7. She has been better at wearing her compression stockings.  She completed 1 week of keflex today.   Has completed COVID vax 09-2019.   She has a proximal TKR.   She sent a message to the ID clinic 5-18 with photos of a wound on her RLE.  She was restarted on her keflex. She has been putting a band-aid with neosporin on as well as xeroform.  She started on keflex qid today. Also started on mupirocin oint. She is unclear if erythema has worsened over last 24h.  Is hypersensitive to sheet or cloth.   Review of Systems  Constitutional:  Negative for appetite change, chills, fever and unexpected weight change.  Cardiovascular: Negative for leg swelling.  Skin: Positive for wound.  has been keeping her leg elevated some. (her husband confirms this).  She has apt with ortho tomorrow for R shoulder replacement.  No swelling in her L TKR except for after recent fall.  Please see HPI. All other systems reviewed and negative.      Objective:   Physical Exam Vitals reviewed.  Constitutional:      Appearance: Normal appearance. She is obese.  HENT:     Mouth/Throat:     Mouth: Mucous membranes are moist.     Pharynx: No oropharyngeal exudate.  Eyes:     Extraocular Movements: Extraocular movements intact.     Pupils: Pupils are equal, round, and reactive to light.  Cardiovascular:     Rate and Rhythm: Normal rate and regular rhythm.  Pulmonary:     Effort: Pulmonary effort is normal.     Breath sounds: Normal breath sounds.  Abdominal:     General: Bowel sounds are normal. There is no distension.     Palpations: Abdomen is soft.     Tenderness: There is no abdominal tenderness.  Musculoskeletal:     Cervical back: Normal range of motion.     Right lower leg: No edema.  Left lower leg: No edema.  Skin:    General: Skin is warm and dry.  Neurological:     Mental Status: She is alert.  Psychiatric:        Mood and Affect: Mood normal.       No increase in heat. Non-tender, no d/c. No fluctuance.      Assessment & Plan:

## 2019-12-17 NOTE — Telephone Encounter (Signed)
Per MD faxed orders to [facility] for one time infusion. Patient will need infusion before next follow up appointment in a week.  Will call patient with appointment information once scheduled. Horace

## 2019-12-17 NOTE — Assessment & Plan Note (Signed)
She has f/u with Dr Marlou Sa tomorrow.  Appreciate his eval.

## 2019-12-17 NOTE — Assessment & Plan Note (Addendum)
Will have her seen by derm Will get her orita or dalbavancin Will continue keflex for now.  Will see her back 1 week.  If swelling, drainage, fever or chills, will see asap.

## 2019-12-17 NOTE — Assessment & Plan Note (Signed)
No effusion of knee. No tenderness, no heat.

## 2019-12-18 ENCOUNTER — Encounter: Payer: Self-pay | Admitting: Orthopedic Surgery

## 2019-12-18 ENCOUNTER — Ambulatory Visit (INDEPENDENT_AMBULATORY_CARE_PROVIDER_SITE_OTHER): Payer: Medicare Other | Admitting: Orthopedic Surgery

## 2019-12-18 VITALS — Ht 63.0 in | Wt 214.0 lb

## 2019-12-18 DIAGNOSIS — M19011 Primary osteoarthritis, right shoulder: Secondary | ICD-10-CM

## 2019-12-18 NOTE — Telephone Encounter (Signed)
Laverne at [facility] called to schedule patient's infusion for Friday 5/21 at 11:00, [place].  RN called patient, left message with appointment information and asked her to please call back and confirm. Landis Gandy, RN

## 2019-12-19 ENCOUNTER — Other Ambulatory Visit: Payer: Self-pay

## 2019-12-19 ENCOUNTER — Ambulatory Visit (INDEPENDENT_AMBULATORY_CARE_PROVIDER_SITE_OTHER): Payer: Medicare Other | Admitting: Orthopedic Surgery

## 2019-12-19 ENCOUNTER — Encounter: Payer: Self-pay | Admitting: Orthopedic Surgery

## 2019-12-19 ENCOUNTER — Other Ambulatory Visit (HOSPITAL_COMMUNITY): Payer: Self-pay | Admitting: *Deleted

## 2019-12-19 ENCOUNTER — Telehealth: Payer: Self-pay | Admitting: *Deleted

## 2019-12-19 VITALS — Ht 63.0 in | Wt 214.0 lb

## 2019-12-19 DIAGNOSIS — L97919 Non-pressure chronic ulcer of unspecified part of right lower leg with unspecified severity: Secondary | ICD-10-CM

## 2019-12-19 DIAGNOSIS — I87331 Chronic venous hypertension (idiopathic) with ulcer and inflammation of right lower extremity: Secondary | ICD-10-CM | POA: Diagnosis not present

## 2019-12-19 NOTE — Telephone Encounter (Signed)
Patient saw Dr Marlou Sa this week, was worked in to see Dr Sharol Given as well to evaluate her leg ulcer. Per patient, Dr Sharol Given thinks this is vascular, is recommending weekly wrappings.  Patient is scheduled for IV antibiotic infusion 5/21. She is continuing her keflex 500 mg 4 times daily.  She will continue this until she sees Dr Johnnye Sima on 5/25. Landis Gandy, RN

## 2019-12-20 ENCOUNTER — Encounter: Payer: Self-pay | Admitting: Orthopedic Surgery

## 2019-12-20 ENCOUNTER — Ambulatory Visit (HOSPITAL_COMMUNITY)
Admission: RE | Admit: 2019-12-20 | Discharge: 2019-12-20 | Disposition: A | Discharge status: Home / Self Care | Payer: Medicare Other | Source: Ambulatory Visit | Attending: Infectious Diseases | Admitting: Infectious Diseases

## 2019-12-20 DIAGNOSIS — L02416 Cutaneous abscess of left lower limb: Secondary | ICD-10-CM | POA: Insufficient documentation

## 2019-12-20 DIAGNOSIS — L03116 Cellulitis of left lower limb: Secondary | ICD-10-CM | POA: Diagnosis not present

## 2019-12-20 MED ORDER — ORITAVANCIN DIPHOSPHATE 400 MG IV SOLR
1200.0000 mg | Freq: Once | INTRAVENOUS | Status: AC
  Administered 2019-12-20: 1200 mg via INTRAVENOUS
  Filled 2019-12-20: qty 120

## 2019-12-20 NOTE — Progress Notes (Signed)
Office Visit Note   Patient: Caitlin [name]           Date of Birth: [birth year]           MRN: Caitlin [name] Visit Date: 12/18/2019 Requested by: Caitlin [name], [address] [place],  [place] [address] PCP: Caitlin Sites, MD  Subjective: Chief Complaint  Patient presents with  . Right Shoulder - Pain    HPI: Caitlin [name] is a 74 year old patient with right shoulder arthritis.  She reports chronic right shoulder pain which is worsening as well as diminished range of motion.  Aleve is not giving her much relief.  She has had cortisone injection in the shoulder which gives her only temporary relief.  The pain does wake her from sleep.  She is starting to use her left hand for activities of daily living.  Hurts for her to move the right arm.  Left shoulder has reasonable strength and function per patient.  Patient also has a left leg wound which has been difficult to heal.  She is seeing infectious disease specialist who is been having her on antibiotics off and on for the last 5 months.  Still has not healed.  Does have a history of phlebitis and laser vein ablation.              ROS: All systems reviewed are negative as they relate to the chief complaint within the history of present illness.  Patient denies  fevers or chills.   Assessment & Plan: Visit Diagnoses:  1. Primary osteoarthritis, right shoulder     Plan: Impression is end-stage right shoulder arthritis with MRI scan showing supraspinatus tendinopathy and end-stage arthritis.  I think that she does need shoulder replacement.;  However, it would be nice to get this left leg open wound resolved prior to that surgery.  She has been on multiple roads but it does not look like the antibiotic route is working.  Even though she is not that excited about the prospect of compression I would like for her to see Dr. Sharol [name] to offer his evaluation on the healing of this ulcer.  Notably this ulcer is on the same leg where she has had a total  knee replacement which makes it all the more imperative to achieve healing so that it does not infect that knee.  Once she gets this ulcer healed it would be great to get her on the schedule for shoulder replacement RSA versus anatomic.  Would likely be RSA based on her age and rotator cuff tendinosis and degeneration.  Follow-Up Instructions: No follow-ups on file.   Orders:  No orders of the defined types were placed in this encounter.  No orders of the defined types were placed in this encounter.     Procedures: No procedures performed   Clinical Data: No additional findings.  Objective: Vital Signs: Ht 5\' 3"  (1.6 m)   Wt 214 lb (97.1 kg)   BMI 37.91 kg/m   Physical Exam:   Constitutional: Patient appears well-developed HEENT:  Head: Normocephalic Eyes:EOM are normal Neck: Normal range of motion Cardiovascular: Normal rate Pulmonary/chest: Effort normal Neurologic: Patient is alert Skin: Skin is warm Psychiatric: Patient has normal mood and affect    Ortho Exam: Ortho exam demonstrates 5 out of 5 grip EPL FPL interosseous wrist extra extension bicep triceps and deltoid strength.  Right shoulder has diminished passive range of motion with external rotation to about 30 degrees on the right 60 on the left.  Does have some coarseness bone-on-bone coarseness with passive range of motion.  Rotator cuff strength reasonable 5 out of 5 subscap testing as well as infraspinatus and supraspinatus.  Active motion is limited to below 90 degrees of forward flexion and abduction on the right.  Deltoid is functional.  Specialty Comments:  No specialty comments available.  Imaging: No results found.   PMFS History: Patient Active Problem List   Diagnosis Date Noted  . Primary osteoarthritis, right shoulder 12/04/2019  . Chronic right shoulder pain 11/29/2018  . Localized swelling of left lower extremity 07/19/2018  . Cellulitis and abscess of left leg 06/15/2018  . Unilateral  primary osteoarthritis, left knee 05/02/2017  . S/P total knee replacement using cement, left 05/02/2017  . Primary osteoarthritis of left knee 12/21/2016  . Chondromalacia patellae, left knee 05/26/2016  . Breast cancer of upper-inner quadrant of right female breast ([place]) 11/04/2015  . GERD (gastroesophageal reflux disease) 09/02/2013  . Obesity 09/02/2013  . Ventral hernia, recurrent 09/02/2013  . Unspecified asthma(493.90) 12/12/2012  . Cough variant asthma 03/27/2012  . Right middle lobe syndrome 03/27/2012   Past Medical History:  Diagnosis Date  . Arthritis   . Asthma   . Breast cancer ([facility])   . Breast disorder    cancer  . Breast mass    benign  . Cellulitis   . GERD (gastroesophageal reflux disease)   . Heart murmur    AS CHILD  OUTGREW IT  . History of hiatal hernia   . Hyperlipidemia   . Hypertension   . Impaired fasting glucose   . Rectocele   . Varicose vein of leg     Family History  Problem Relation Age of Onset  . Colon cancer Father   . Rectal cancer Father   . Prostate cancer Father   . Dementia Mother   . Osteoporosis Mother   . Healthy Daughter   . Healthy Son   . Pancreatic cancer Paternal Grandfather   . Heart disease Paternal Grandmother   . Heart disease Maternal Grandfather   . Breast cancer Maternal Grandmother   . Scoliosis Sister     Past Surgical History:  Procedure Laterality Date  . BREAST LUMPECTOMY WITH RADIOACTIVE SEED AND SENTINEL LYMPH NODE BIOPSY Right 11/11/2015   Procedure: RIGHT BREAST RADIOACTIVE SEED GUIDED  LUMPECTOMY AND RADIOACTIVE SEED TARGETED SENTINEL LYMPH NODE BIOPSY;  Surgeon: Stark Klein, MD;  Location: [facility];  Service: General;  Laterality: Right;  . BREAST SURGERY Left    breast biopies x4  . CHOLECYSTECTOMY  09/08/2010  . COLONOSCOPY    . COLONOSCOPY N/A 09/25/2013   Procedure: COLONOSCOPY;  Surgeon: Rogene Houston, MD;  [facility];  Service: Endoscopy;  Laterality: N/A;   100-moved to 1200 Ann to notify pt  . HERNIA REPAIR  12/24/2010, 12-2014   TOTAL 4  . LAPAROSCOPIC GASTRIC SLEEVE RESECTION  11-2013   at [facility]  2007/2008  . TONSILLECTOMY  1968  . TOTAL KNEE ARTHROPLASTY Left 05/02/2017  . TOTAL KNEE ARTHROPLASTY Left 05/02/2017   Procedure: LEFT TOTAL KNEE ARTHROPLASTY;  Surgeon: Caitlin Balding, MD;  Location: [facility];  Service: Orthopedics;  Laterality: Left;  . UPPER GASTROINTESTINAL ENDOSCOPY    . VAGINAL HYSTERECTOMY  1980s   Social History   Occupational History  . Occupation: retired    Fish farm manager: RETIRED    Comment: teacher  Tobacco Use  . Smoking status: Never Smoker  . Smokeless  tobacco: Never Used  Substance and Sexual Activity  . Alcohol use: No  . Drug use: No  . Sexual activity: Yes    Birth control/protection: Surgical    Comment: hyst

## 2019-12-20 NOTE — Progress Notes (Signed)
Office Visit Note   Patient: Caitlin [name]           Date of Birth: 1946/01/12           MRN: [id number] Visit Date: 12/19/2019              Requested by: Sharilyn Sites, [address] [place],  [place] [address] PCP: Sharilyn Sites, MD  Chief Complaint  Patient presents with  . Left Leg - Open Wound      HPI: Patient is a 74 year old woman is seen for initial evaluation referral from Dr. Marlou Sa.  Patient has osteoarthritis of her shoulder and states she needs the ulcer on the right leg healed prior to surgery.  Patient states she has had laser vein ablation in the past she states she is undergone compression therapy for both legs in the past.  She states she is currently on antibiotics with Dr. Johnnye Sima of infectious disease.  Patient states she is currently taking Keflex and is scheduled for IV antibiotics infusion tomorrow.  She has undergone wound dressing changes including Xeroform Neosporin and Bactroban.  Assessment & Plan: Visit Diagnoses:  1. Chronic venous hypertension (idiopathic) with ulcer and inflammation of right lower extremity (HCC)     Plan: Discussed with the patient she does have a venous insufficiency ulcer.  Discussed that compression is the cornerstone for this treatment.  Recommended compression stockings that could be changed daily so she could cleanse the wound daily.  Patient states that she or her husband would be unable to put on compression socks.  We will place her in Dynaflex wrap follow-up weekly.  Discussed that once this heals she will need compression socks long-term.  Follow-Up Instructions: Return in about 1 week (around 12/26/2019).   Ortho Exam  Patient is alert, oriented, no adenopathy, well-dressed, normal affect, normal respiratory effort. Examination patient has brawny skin color changes and varicose veins of both lower extremities.  There is significant amount of dermatitis in both legs there is no cellulitis.  Patient does have pitting  edema in both legs with a fibrinous venous ulcer on the right which is nontender.  The ulcer is approximately 2 cm in diameter and 2 mm deep her calf is 37 cm in circumference.  Patient has a palpable dorsalis pedis and posterior tibial pulse.  Imaging: No results found.   Labs: Lab Results  Component Value Date   LABURIC 3.7 04/07/2018   REPTSTATUS 06/04/2018 FINAL 06/01/2018   GRAMSTAIN  06/01/2018    RARE WBC PRESENT, PREDOMINANTLY PMN RARE GRAM POSITIVE COCCI Performed at [facility], [place] [address] [place], [place] [address]    CULT FEW METHICILLIN RESISTANT STAPHYLOCOCCUS AUREUS 06/01/2018   LABORGA METHICILLIN RESISTANT STAPHYLOCOCCUS AUREUS 06/01/2018     Lab Results  Component Value Date   ALBUMIN 3.9 12/03/2019   ALBUMIN 3.9 05/20/2019   ALBUMIN 3.9 11/27/2018   LABURIC 3.7 04/07/2018    No results found for: MG Lab Results  Component Value Date   VD25OH 43.48 12/03/2019   VD25OH 38.73 05/20/2019    No results found for: PREALBUMIN CBC EXTENDED Latest Ref Rng & Units 12/03/2019 05/20/2019 11/27/2018  WBC 4.0 - 10.5 K/uL 5.4 6.0 6.3  RBC 3.87 - 5.11 MIL/uL 3.53(L) 3.69(L) 3.50(L)  HGB 12.0 - 15.0 g/dL 12.0 12.0 12.1  HCT 36.0 - 46.0 % 36.5 37.4 36.2  PLT 150 - 400 K/uL 264 276 208  NEUTROABS 1.7 - 7.7 K/uL 2.8 3.3 3.6  LYMPHSABS 0.7 -  4.0 K/uL 1.4 1.7 1.6     Body mass index is 37.91 kg/m.  Orders:  No orders of the defined types were placed in this encounter.  No orders of the defined types were placed in this encounter.    Procedures: No procedures performed  Clinical Data: No additional findings.  ROS:  All other systems negative, except as noted in the HPI. Review of Systems  Objective: Vital Signs: Ht 5\' 3"  (1.6 m)   Wt 214 lb (97.1 kg)   BMI 37.91 kg/m   Specialty Comments:  No specialty comments available.  PMFS History: Patient Active Problem List   Diagnosis Date Noted  . Primary osteoarthritis, right shoulder  12/04/2019  . Chronic right shoulder pain 11/29/2018  . Localized swelling of left lower extremity 07/19/2018  . Cellulitis and abscess of left leg 06/15/2018  . Unilateral primary osteoarthritis, left knee 05/02/2017  . S/P total knee replacement using cement, left 05/02/2017  . Primary osteoarthritis of left knee 12/21/2016  . Chondromalacia patellae, left knee 05/26/2016  . Breast cancer of upper-inner quadrant of right female breast ([place]) 11/04/2015  . GERD (gastroesophageal reflux disease) 09/02/2013  . Obesity 09/02/2013  . Ventral hernia, recurrent 09/02/2013  . Unspecified asthma(493.90) 12/12/2012  . Cough variant asthma 03/27/2012  . Right middle lobe syndrome 03/27/2012   Past Medical History:  Diagnosis Date  . Arthritis   . Asthma   . Breast cancer ([place])   . Breast disorder    cancer  . Breast mass    benign  . Cellulitis   . GERD (gastroesophageal reflux disease)   . Heart murmur    AS CHILD  OUTGREW IT  . History of hiatal hernia   . Hyperlipidemia   . Hypertension   . Impaired fasting glucose   . Rectocele   . Varicose vein of leg     Family History  Problem Relation Age of Onset  . Colon cancer Father   . Rectal cancer Father   . Prostate cancer Father   . Dementia Mother   . Osteoporosis Mother   . Healthy Daughter   . Healthy Son   . Pancreatic cancer Paternal Grandfather   . Heart disease Paternal Grandmother   . Heart disease Maternal Grandfather   . Breast cancer Maternal Grandmother   . Scoliosis Sister     Past Surgical History:  Procedure Laterality Date  . BREAST LUMPECTOMY WITH RADIOACTIVE SEED AND SENTINEL LYMPH NODE BIOPSY Right 11/11/2015   Procedure: RIGHT BREAST RADIOACTIVE SEED GUIDED  LUMPECTOMY AND RADIOACTIVE SEED TARGETED SENTINEL LYMPH NODE BIOPSY;  Surgeon: Stark Klein, MD;  Location: [facility];  Service: General;  Laterality: Right;  . BREAST SURGERY Left    breast biopies x4  . CHOLECYSTECTOMY  09/08/2010    . COLONOSCOPY    . COLONOSCOPY N/A 09/25/2013   Procedure: COLONOSCOPY;  Surgeon: Rogene Houston, MD;  [facility];  Service: Endoscopy;  Laterality: N/A;  100-moved to 1200 Ann to notify pt  . HERNIA REPAIR  12/24/2010, 12-2014   TOTAL 4  . LAPAROSCOPIC GASTRIC SLEEVE RESECTION  11-2013   at [facility]  2007/2008  . TONSILLECTOMY  1968  . TOTAL KNEE ARTHROPLASTY Left 05/02/2017  . TOTAL KNEE ARTHROPLASTY Left 05/02/2017   Procedure: LEFT TOTAL KNEE ARTHROPLASTY;  Surgeon: [name], MD;  Location: [facility];  Service: Orthopedics;  Laterality: Left;  . UPPER GASTROINTESTINAL ENDOSCOPY    .  VAGINAL HYSTERECTOMY  1980s   Social History   Occupational History  . Occupation: retired    Fish farm manager: RETIRED    Comment: teacher  Tobacco Use  . Smoking status: Never Smoker  . Smokeless tobacco: Never Used  Substance and Sexual Activity  . Alcohol use: No  . Drug use: No  . Sexual activity: Yes    Birth control/protection: Surgical    Comment: hyst

## 2019-12-23 DIAGNOSIS — I890 Lymphedema, not elsewhere classified: Secondary | ICD-10-CM | POA: Diagnosis not present

## 2019-12-23 DIAGNOSIS — C50211 Malignant neoplasm of upper-inner quadrant of right female breast: Secondary | ICD-10-CM | POA: Diagnosis not present

## 2019-12-24 ENCOUNTER — Ambulatory Visit (INDEPENDENT_AMBULATORY_CARE_PROVIDER_SITE_OTHER): Payer: Medicare Other | Admitting: Infectious Diseases

## 2019-12-24 ENCOUNTER — Encounter: Payer: Self-pay | Admitting: Infectious Diseases

## 2019-12-24 ENCOUNTER — Other Ambulatory Visit: Payer: Self-pay

## 2019-12-24 DIAGNOSIS — L02416 Cutaneous abscess of left lower limb: Secondary | ICD-10-CM | POA: Diagnosis not present

## 2019-12-24 DIAGNOSIS — C50211 Malignant neoplasm of upper-inner quadrant of right female breast: Secondary | ICD-10-CM | POA: Diagnosis not present

## 2019-12-24 DIAGNOSIS — L03116 Cellulitis of left lower limb: Secondary | ICD-10-CM

## 2019-12-24 DIAGNOSIS — Z170 Estrogen receptor positive status [ER+]: Secondary | ICD-10-CM

## 2019-12-24 NOTE — Assessment & Plan Note (Signed)
She was seen by her onc-surgeon and released this week.

## 2019-12-24 NOTE — Progress Notes (Signed)
   Subjective:    Patient ID: Caitlin [name], female    DOB: 05-08-1946, 74 y.o.   MRN: [id number]  HPI 74yo F with hx of vericose veins, prev ablation. L TKR (Dr Durward Fortes), L breast CA (treated with XRT 2017).  She had vericose bleeds which improved with local care.  After her last bleed of her LLE she then developed swelling and pain in her ankle. She was seen in ED and was given NSAID. She returned with worsening pain ~48 h later and was told she had cellulitis. She had MRI done 10-7 that showed: Soft tissue ulcer and cellulitis along the medial aspect of the hindfoot. No marrow signal abnormality indicative of acute osteomyelitis. She was given bactrim but developed hives. She was then changed to doxycycline.  1 week later she developed oozing from a boil on her ankle. She was seen by PCP and was started on augmentin and was seen in [place].  She had wound Cx 06-01-18 MRSA (s- doxy, bactrim, clinda).  She was seen in ID clinic on 11-15-19and was given oritavancin via pharmacy 06-18-18.  She had repeat MRI 11-18-19that showed cellulitis but no osteo.  She has not been seen in ID since 08-2018.   She returned 2-24-21with erythema on her L foot/ankle for the last 2 months. She was then seen 3-25. She had a small open wound and given a dose of oritavancin 3-29.  Her course has also been complicated by thrush.   She Was seen by vascular on 4-7. She has been better at wearing her compression stockings.  She completed 1 week of keflex today.   Has completed COVID vax 09-2019.  She has a proximal TKR.  She sent a message to the ID clinic 5-18 with photos of a wound on her RLE.  She was restarted on her keflex. She has been putting a band-aid with neosporin, mupirocin, on as well as xeroform.   She has been seen by Ortho x2 since her last visit and is felt to have a venous ulcer (in need of compression socks).  She has been cleared for her R shoulder surgery when ulcer  resolves.  Her leg is now wrapped.  Completes keflex end of this week. She received dose of oritavancin on 5-21.  Has noted no proximal erythema or tracking.   Review of Systems  Constitutional: Negative for chills and fever.  Gastrointestinal: Negative for constipation and diarrhea.  Genitourinary: Negative for difficulty urinating.  leg is itchy.      Objective:   Physical Exam Vitals reviewed.  Constitutional:      Appearance: Normal appearance. She is obese.  Musculoskeletal:       Legs:  Neurological:     Mental Status: She is alert.           Assessment & Plan:

## 2019-12-24 NOTE — Assessment & Plan Note (Signed)
varicose ulcer Appreciate Dr Jess Barters f/u appreciated.  Stop keflex.  will have her f/u prn.  Asked her to call if worsening swelling/erythema/drainage.

## 2019-12-26 ENCOUNTER — Encounter: Payer: Self-pay | Admitting: Orthopedic Surgery

## 2019-12-26 ENCOUNTER — Other Ambulatory Visit: Payer: Self-pay

## 2019-12-26 ENCOUNTER — Ambulatory Visit (INDEPENDENT_AMBULATORY_CARE_PROVIDER_SITE_OTHER): Payer: Medicare Other | Admitting: Physician Assistant

## 2019-12-26 VITALS — Ht 63.0 in | Wt 214.0 lb

## 2019-12-26 DIAGNOSIS — L97919 Non-pressure chronic ulcer of unspecified part of right lower leg with unspecified severity: Secondary | ICD-10-CM

## 2019-12-26 DIAGNOSIS — I87331 Chronic venous hypertension (idiopathic) with ulcer and inflammation of right lower extremity: Secondary | ICD-10-CM

## 2019-12-26 NOTE — Progress Notes (Signed)
Office Visit Note   Patient: Caitlin [name]           Date of Birth: 03-14-1946           MRN: [id number] Visit Date: 12/26/2019              Requested by: Sharilyn Sites, [place],  [place] [address] PCP: Sharilyn Sites, MD  Chief Complaint  Patient presents with  . Left Leg - Follow-up      HPI: This is a pleasant 74 year old woman who is following up today on her left lower leg.  She has a history of venous stasis disease.  She has been wearing a Dynaflex wrap.  She was concerned that she could not place her VIVE stocking.  Assessment & Plan: Visit Diagnoses: No diagnosis found.  Plan: I instructed her and her husband on how to apply the compression sock.  I told him the importance of not having any wrinkles in the sock especially around the ankle.  She will follow-up in 3 weeks.  She is significantly improved  Follow-Up Instructions: No follow-ups on file.   Ortho Exam  Patient is alert, oriented, no adenopathy, well-dressed, normal affect, normal respiratory effort. Left lower leg.  Wrinkling of the skin.  Calves are soft and nontender.  She has a very small 5 mm ulcer with a depth of a millimeter there is fibrinous tissue at the base of this.  With some mild surrounding erythema but no frank cellulitis.  Imaging: No results found. No images are attached to the encounter.  Labs: Lab Results  Component Value Date   LABURIC 3.7 04/07/2018   REPTSTATUS 06/04/2018 FINAL 06/01/2018   GRAMSTAIN  06/01/2018    RARE WBC PRESENT, PREDOMINANTLY PMN RARE GRAM POSITIVE COCCI Performed at [facility], [place] [address], [place], [place] [address]    CULT FEW METHICILLIN RESISTANT STAPHYLOCOCCUS AUREUS 06/01/2018   LABORGA METHICILLIN RESISTANT STAPHYLOCOCCUS AUREUS 06/01/2018     Lab Results  Component Value Date   ALBUMIN 3.9 12/03/2019   ALBUMIN 3.9 05/20/2019   ALBUMIN 3.9 11/27/2018   LABURIC 3.7 04/07/2018    No results found for:  MG Lab Results  Component Value Date   VD25OH 43.48 12/03/2019   VD25OH 38.73 05/20/2019    No results found for: PREALBUMIN CBC EXTENDED Latest Ref Rng & Units 12/03/2019 05/20/2019 11/27/2018  WBC 4.0 - 10.5 K/uL 5.4 6.0 6.3  RBC 3.87 - 5.11 MIL/uL 3.53(L) 3.69(L) 3.50(L)  HGB 12.0 - 15.0 g/dL 12.0 12.0 12.1  HCT 36.0 - 46.0 % 36.5 37.4 36.2  PLT 150 - 400 K/uL 264 276 208  NEUTROABS 1.7 - 7.7 K/uL 2.8 3.3 3.6  LYMPHSABS 0.7 - 4.0 K/uL 1.4 1.7 1.6     Body mass index is 37.91 kg/m.  Orders:  No orders of the defined types were placed in this encounter.  No orders of the defined types were placed in this encounter.    Procedures: No procedures performed  Clinical Data: No additional findings.  ROS:  All other systems negative, except as noted in the HPI. Review of Systems  Objective: Vital Signs: Ht 5\' 3"  (1.6 m)   Wt 214 lb (97.1 kg)   BMI 37.91 kg/m   Specialty Comments:  No specialty comments available.  PMFS History: Patient Active Problem List   Diagnosis Date Noted  . Primary osteoarthritis, right shoulder 12/04/2019  . Chronic right shoulder pain 11/29/2018  . Localized swelling of left  lower extremity 07/19/2018  . Cellulitis and abscess of left leg 06/15/2018  . Unilateral primary osteoarthritis, left knee 05/02/2017  . S/P total knee replacement using cement, left 05/02/2017  . Primary osteoarthritis of left knee 12/21/2016  . Chondromalacia patellae, left knee 05/26/2016  . Breast cancer of upper-inner quadrant of right female breast ([place] [place]) 11/04/2015  . GERD (gastroesophageal reflux disease) 09/02/2013  . Obesity 09/02/2013  . Ventral hernia, recurrent 09/02/2013  . Unspecified asthma(493.90) 12/12/2012  . Cough variant asthma 03/27/2012  . Right middle lobe syndrome 03/27/2012   Past Medical History:  Diagnosis Date  . Arthritis   . Asthma   . Breast cancer ([place])   . Breast disorder    cancer  . Breast mass    benign  . Cellulitis    . GERD (gastroesophageal reflux disease)   . Heart murmur    AS CHILD  OUTGREW IT  . History of hiatal hernia   . Hyperlipidemia   . Hypertension   . Impaired fasting glucose   . Rectocele   . Varicose vein of leg     Family History  Problem Relation Age of Onset  . Colon cancer Father   . Rectal cancer Father   . Prostate cancer Father   . Dementia Mother   . Osteoporosis Mother   . Healthy Daughter   . Healthy Son   . Pancreatic cancer Paternal Grandfather   . Heart disease Paternal Grandmother   . Heart disease Maternal Grandfather   . Breast cancer Maternal Grandmother   . Scoliosis Sister     Past Surgical History:  Procedure Laterality Date  . BREAST LUMPECTOMY WITH RADIOACTIVE SEED AND SENTINEL LYMPH NODE BIOPSY Right 11/11/2015   Procedure: RIGHT BREAST RADIOACTIVE SEED GUIDED  LUMPECTOMY AND RADIOACTIVE SEED TARGETED SENTINEL LYMPH NODE BIOPSY;  Surgeon: Stark Klein, MD;  Location: [facility];  Service: General;  Laterality: Right;  . BREAST SURGERY Left    breast biopies x4  . CHOLECYSTECTOMY  09/08/2010  . COLONOSCOPY    . COLONOSCOPY N/A 09/25/2013   Procedure: COLONOSCOPY;  Surgeon: Rogene Houston, MD;  [facility];  Service: Endoscopy;  Laterality: N/A;  100-moved to 1200 Ann to notify pt  . HERNIA REPAIR  12/24/2010, 12-2014   TOTAL 4  . LAPAROSCOPIC GASTRIC SLEEVE RESECTION  11-2013   at [facility]  2007/2008  . TONSILLECTOMY  1968  . TOTAL KNEE ARTHROPLASTY Left 05/02/2017  . TOTAL KNEE ARTHROPLASTY Left 05/02/2017   Procedure: LEFT TOTAL KNEE ARTHROPLASTY;  Surgeon: [name], MD;  Location: [facility];  Service: Orthopedics;  Laterality: Left;  . UPPER GASTROINTESTINAL ENDOSCOPY    . VAGINAL HYSTERECTOMY  1980s   Social History   Occupational History  . Occupation: retired    Fish farm manager: RETIRED    Comment: teacher  Tobacco Use  . Smoking status: Never Smoker  . Smokeless tobacco:  Never Used  Substance and Sexual Activity  . Alcohol use: No  . Drug use: No  . Sexual activity: Yes    Birth control/protection: Surgical    Comment: hyst

## 2019-12-30 DIAGNOSIS — C50211 Malignant neoplasm of upper-inner quadrant of right female breast: Secondary | ICD-10-CM | POA: Diagnosis not present

## 2019-12-30 DIAGNOSIS — J4540 Moderate persistent asthma, uncomplicated: Secondary | ICD-10-CM | POA: Diagnosis not present

## 2019-12-30 DIAGNOSIS — E7849 Other hyperlipidemia: Secondary | ICD-10-CM | POA: Diagnosis not present

## 2019-12-30 DIAGNOSIS — I10 Essential (primary) hypertension: Secondary | ICD-10-CM | POA: Diagnosis not present

## 2019-12-31 ENCOUNTER — Encounter: Payer: Self-pay | Admitting: Pulmonary Disease

## 2019-12-31 ENCOUNTER — Ambulatory Visit (INDEPENDENT_AMBULATORY_CARE_PROVIDER_SITE_OTHER): Payer: Medicare Other | Admitting: Pulmonary Disease

## 2019-12-31 ENCOUNTER — Telehealth: Payer: Self-pay | Admitting: Pulmonary Disease

## 2019-12-31 ENCOUNTER — Other Ambulatory Visit: Payer: Self-pay

## 2019-12-31 DIAGNOSIS — J45991 Cough variant asthma: Secondary | ICD-10-CM | POA: Diagnosis not present

## 2019-12-31 MED ORDER — PREDNISONE 10 MG PO TABS: ORAL_TABLET | ORAL | 0 refills | Status: DC

## 2019-12-31 MED ORDER — BREZTRI AEROSPHERE 160-9-4.8 MCG/ACT IN AERO: 2.0000 | INHALATION_SPRAY | Freq: Two times a day (BID) | RESPIRATORY_TRACT | 6 refills | Status: DC

## 2019-12-31 MED ORDER — TRELEGY ELLIPTA 200-62.5-25 MCG/INH IN AEPB: 1.0000 | INHALATION_SPRAY | Freq: Every day | RESPIRATORY_TRACT | 0 refills | Status: DC

## 2019-12-31 NOTE — Telephone Encounter (Signed)
Patient is returning phone call. Patient phone number is [phone].

## 2019-12-31 NOTE — Telephone Encounter (Signed)
Patient has been contacted. She was on Breztri samples. The cost to fill this medication is 329.00, she had previously been on Symbicort which was about a hundred dollars less. Please advise.

## 2019-12-31 NOTE — Telephone Encounter (Signed)
Patient can not afford meds prescribed this AM, please advise.

## 2019-12-31 NOTE — Telephone Encounter (Signed)
I called pt but she was not available. I asked her husband if she could give me a call back. He will relaty the message. Will await return call to discuss inhaler options.

## 2019-12-31 NOTE — Telephone Encounter (Signed)
Findings of benefits investigation via test claims at [facility]:  Insurance: [employer] MEDICARE  Trelegy- # 1 inhaler for a 1 month supply through patient's insurance is $ 47.00.  Symbicort - # 1 inhaler for a 1 month supply through patient's insurance is $ 47.00.  Breo - # 1 inhaler for a 1 month supply through patient's insurance is $ 47.00.  Advair Diskus/ HFA - # 1 inhaler for a 1 month supply through patient's insurance is $ 47.00. Same for both.  Airduo and Dulera are non-formulary.   *No deductible is applying to patient's claims  4:00 PM Beatriz Chancellor, CPhT

## 2019-12-31 NOTE — Telephone Encounter (Signed)
Spoke with pt, advised her the message from Frederika and she agreed to change to Trelegy. I left samples up front for pt to pick up. I gave her the information about the cost of the Trelegy and she stated she could afford that. Nothing further is needed.

## 2019-12-31 NOTE — Patient Instructions (Addendum)
You were seen today by [name], NP  for:   1. Cough variant asthma - Budeson-Glycopyrrol-Formoterol (BREZTRI AEROSPHERE) 160-9-4.8 MCG/ACT AERO; Inhale 2 puffs into the lungs 2 (two) times daily.  Dispense: 5.9 g; Refill: 6 - predniSONE (DELTASONE) 10 MG tablet; Take 2 tablets (20mg  total) daily for the next 5 days. Take in the AM with food.  Dispense: 10 tablet; Refill: 0   We recommend today:   Meds ordered this encounter  Medications   Budeson-Glycopyrrol-Formoterol (BREZTRI AEROSPHERE) 160-9-4.8 MCG/ACT AERO    Sig: Inhale 2 puffs into the lungs 2 (two) times daily.    Dispense:  5.9 g    Refill:  6    Order Specific Question:   Lot Number?    Answer:   [id number]    Order Specific Question:   Expiration Date?    Answer:   12/29/2020    Order Specific Question:   Manufacturer?    Answer:   [employer] [[id number]]    Order Specific Question:   Quantity    Answer:   1   predniSONE (DELTASONE) 10 MG tablet    Sig: Take 2 tablets (20mg  total) daily for the next 5 days. Take in the AM with food.    Dispense:  10 tablet    Refill:  0    Follow Up:    Return in about 6 weeks (around 02/11/2020), or if symptoms worsen or fail to improve, for [facility], Follow up with Dr. Melvyn Novas.   Please do your part to reduce the spread of COVID-19:      Reduce your risk of any infection  and COVID19 by using the similar precautions used for avoiding the common cold or flu:   Wash your hands often with soap and warm water for at least 20 seconds.  If soap and water are not readily available, use an alcohol-based hand sanitizer with at least 60% alcohol.   If coughing or sneezing, cover your mouth and nose by coughing or sneezing into the elbow areas of your shirt or coat, into a tissue or into your sleeve (not your hands).  WEAR A MASK when in public   Avoid shaking hands with others and consider head nods or verbal greetings only.  Avoid touching your eyes, nose, or mouth with  unwashed hands.   Avoid close contact with people who are sick.  Avoid places or events with large numbers of people in one location, like concerts or sporting events.  If you have some symptoms but not all symptoms, continue to monitor at home and seek medical attention if your symptoms worsen.  If you are having a medical emergency, call 911.   [place] / e-Visit: [email or web address]         [facility]: [facility]: [id number]                   [facility]: [id number]     It is flu season:   >>> Best ways to protect herself from the flu: Receive the yearly flu vaccine, practice good hand hygiene washing with soap and also using hand sanitizer when available, eat a nutritious meals, get adequate rest, hydrate appropriately   Please contact the office if your symptoms worsen or you have concerns that you are not improving.   Thank you for choosing [facility] for your healthcare, and for allowing [place] to partner  with you on your healthcare journey. I am thankful to be able to provide care to you today.   Wyn Quaker FNP-C

## 2019-12-31 NOTE — Progress Notes (Signed)
Virtual Visit via Telephone Note  I connected with Caitlin [name] on 12/31/19 at 11:00 AM EDT by telephone and verified that I am speaking with the correct person using two identifiers.  Location: Patient: Home Provider: [facility] - [id number] [place] [address], [place], [place],  [address]   I discussed the limitations, risks, security and privacy concerns of performing an evaluation and management service by telephone and the availability of in person appointments. I also discussed with the patient that there may be a patient responsible charge related to this service. The patient expressed understanding and agreed to proceed.  Patient consented to consult via telephone: Yes People present and their role in pt care: Pt     History of Present Illness:  74 year old female never smoker followed in our office for cough variant asthma  PMH: GERD, obesity, breast cancer, osteoarthritis Smoking history: Never smoker Maintenance: Breztri  Patient of Dr. Melvyn Novas  Chief complaint:    74 year old female never smoker followed in our office for cough variant asthma.  Patient was last seen in December/2020 by Dr. Melvyn Novas.  Patient reporting that 2 to 3 weeks ago she noticed that her cough is worsening. She had stopped taking her maintenance inhaler when she felt that her breathing was doing better. She resumed taking her maintenance inhaler 4 days ago.  Patient reports that she has other health issues going on right now where she is currently dealing with right shoulder pain hoping to have surgery as well as a left lower extremity leg vascular ulcer which is followed by Dr. Marlou Sa. Patient to schedule an appoint with our office because she felt that her cough is worsening and she may need a prednisone taper or a steroid shot. We will discuss this today.  Patient denies fevers or discolored mucus. She has been followed by infectious disease for the left lower extremity ulcer as well requiring  IV antibiotics.  Observations/Objective:  Social History   Tobacco Use  Smoking Status Never Smoker  Smokeless Tobacco Never Used   Immunization History  Administered Date(s) Administered  . Influenza Split 05/02/2011, 04/24/2012  . Influenza, High Dose Seasonal PF 05/04/2017, 05/16/2019  . Influenza-Unspecified 05/02/2011, 04/24/2012  . Zoster 08/01/2008    Assessment and Plan:  Discussion: Emphasized to the patient is important that she remain on her inhalers at this time. We need to try to avoid corticosteroid use given her active wounds as well as recent antibiotic use. We will do a short course of prednisone on a full taper today. If symptoms or not improving she needs a in person evaluation in the [facility] or the [facility]. Patient is aware to let us know if symptoms or not improving. We will refill Breztri inhaler.   Cough variant asthma Plan: Continue Breztri  Emphasized importance of maintenance inhaler use Emphasized that she should not come off of this inhaler without discussing with [place] Short course of prednisone to help with cough If symptoms worsen will need to have in person evaluation    Follow Up Instructions:  Return in about 6 weeks (around 02/11/2020), or if symptoms worsen or fail to improve, for [facility], Follow up with Dr. Melvyn Novas.   I discussed the assessment and treatment plan with the patient. The patient was provided an opportunity to ask questions and all were answered. The patient agreed with the plan and demonstrated an understanding of the instructions.   The patient was advised to call back or seek an  in-person evaluation if the symptoms worsen or if the condition fails to improve as anticipated.  I provided 23 minutes of non-face-to-face time during this encounter.   Caitlin Rinne, NP

## 2019-12-31 NOTE — Assessment & Plan Note (Signed)
Plan: Continue Breztri  Emphasized importance of maintenance inhaler use Emphasized that she should not come off of this inhaler without discussing with [place] Short course of prednisone to help with cough If symptoms worsen will need to have in person evaluation

## 2019-12-31 NOTE — Telephone Encounter (Signed)
Patient will need to contact her insurance.  Likely given the information provided she has a higher deductible.  We can investigate which inhalers will be most cost effective with our pharmacy team.  Patient needs an ICS/LABA.  We will asked the pharmacy team to investigate which one may be most cost effective.  In the meantime please coordinate the opportunity for the patient to pick up additional Breztri samples in the [place] or [employer] office.  Ultimately we are at the mercy of her insurance coverage.  There is not a simple way for me to be able to fix this.  As all of these inhalers are typically tier 3 or tier 4 medications.   Pharmacy,  Can we please investigate what the best cost effective inhaler would be for the patient.  She at least needs an ICS/LABA formulation.    Wyn Quaker FNP

## 2019-12-31 NOTE — Telephone Encounter (Signed)
I am assuming that the medication that she cannot afford is the inhaler?  Have we contacted the patient?  Have we found out what the cost of the medication is for the patient?    Please call the patient.  Wyn Quaker, FNP

## 2019-12-31 NOTE — Telephone Encounter (Signed)
Based on the information provided could trial of Trelegy Ellipta either 100 or 200 based off of what samples are available.  This is comparable to [employer] inhaler.  Would prefer Trelegy Ellipta 200 if we have samples of this.Patient could pick up from [facility] clinic and be taught inhaler at that point in time as well.Wyn Quaker, FNP

## 2020-01-13 DIAGNOSIS — I87393 Chronic venous hypertension (idiopathic) with other complications of bilateral lower extremity: Secondary | ICD-10-CM | POA: Diagnosis not present

## 2020-01-13 DIAGNOSIS — Z1389 Encounter for screening for other disorder: Secondary | ICD-10-CM | POA: Diagnosis not present

## 2020-01-13 DIAGNOSIS — Z6837 Body mass index (BMI) 37.0-37.9, adult: Secondary | ICD-10-CM | POA: Diagnosis not present

## 2020-01-13 DIAGNOSIS — J4540 Moderate persistent asthma, uncomplicated: Secondary | ICD-10-CM | POA: Diagnosis not present

## 2020-01-13 DIAGNOSIS — R945 Abnormal results of liver function studies: Secondary | ICD-10-CM | POA: Diagnosis not present

## 2020-01-13 DIAGNOSIS — E7849 Other hyperlipidemia: Secondary | ICD-10-CM | POA: Diagnosis not present

## 2020-01-13 DIAGNOSIS — Z9884 Bariatric surgery status: Secondary | ICD-10-CM | POA: Diagnosis not present

## 2020-01-13 DIAGNOSIS — R7309 Other abnormal glucose: Secondary | ICD-10-CM | POA: Diagnosis not present

## 2020-01-14 ENCOUNTER — Encounter: Payer: Self-pay | Admitting: Orthopedic Surgery

## 2020-01-14 ENCOUNTER — Ambulatory Visit (INDEPENDENT_AMBULATORY_CARE_PROVIDER_SITE_OTHER): Payer: Medicare Other | Admitting: Physician Assistant

## 2020-01-14 ENCOUNTER — Other Ambulatory Visit: Payer: Self-pay

## 2020-01-14 VITALS — Ht 63.0 in | Wt 214.0 lb

## 2020-01-14 DIAGNOSIS — L97919 Non-pressure chronic ulcer of unspecified part of right lower leg with unspecified severity: Secondary | ICD-10-CM

## 2020-01-14 DIAGNOSIS — I87331 Chronic venous hypertension (idiopathic) with ulcer and inflammation of right lower extremity: Secondary | ICD-10-CM

## 2020-01-14 NOTE — Progress Notes (Signed)
Office Visit Note   Patient: Caitlin [name]           Date of Birth: May 28, 1946           MRN: [id number] Visit Date: 01/14/2020              Requested by: Sharilyn Sites, [address],  [place] [id number] PCP: Sharilyn Sites, MD  Chief Complaint  Patient presents with  . Left Leg - Follow-up      HPI: Patient is here in follow-up of the left lower extremity.  She has had a small venous ulcer on the lower extremity.  She is now wearing compression stockings.  She feels she is doing much better.  She is awaiting to be cleared so she can schedule her shoulder replacement surgery  Assessment & Plan: Visit Diagnoses: No diagnosis found.  Plan: Venous stasis ulcer significantly improved no evidence of an infective process.  She will continue to wear her socks.  She may go forward with scheduling her shoulder replacement  Follow-Up Instructions: No follow-ups on file.   Ortho Exam  Patient is alert, oriented, no adenopathy, well-dressed, normal affect, normal respiratory effort. Focused examination of her left lower extremity soft calf without any cellulitis no erythema.  Very small one by one eschar without any surrounding erythema or cellulitis no tenderness no drainage  Imaging: No results found. No images are attached to the encounter.  Labs: Lab Results  Component Value Date   LABURIC 3.7 04/07/2018   REPTSTATUS 06/04/2018 FINAL 06/01/2018   GRAMSTAIN  06/01/2018    RARE WBC PRESENT, PREDOMINANTLY PMN RARE GRAM POSITIVE COCCI Performed at [facility], [place] [address], [place], [place] [address]    CULT FEW METHICILLIN RESISTANT STAPHYLOCOCCUS AUREUS 06/01/2018   LABORGA METHICILLIN RESISTANT STAPHYLOCOCCUS AUREUS 06/01/2018     Lab Results  Component Value Date   ALBUMIN 3.9 12/03/2019   ALBUMIN 3.9 05/20/2019   ALBUMIN 3.9 11/27/2018   LABURIC 3.7 04/07/2018    No results found for: MG Lab Results  Component Value Date   VD25OH 43.48  12/03/2019   VD25OH 38.73 05/20/2019    No results found for: PREALBUMIN CBC EXTENDED Latest Ref Rng & Units 12/03/2019 05/20/2019 11/27/2018  WBC 4.0 - 10.5 K/uL 5.4 6.0 6.3  RBC 3.87 - 5.11 MIL/uL 3.53(L) 3.69(L) 3.50(L)  HGB 12.0 - 15.0 g/dL 12.0 12.0 12.1  HCT 36 - 46 % 36.5 37.4 36.2  PLT 150 - 400 K/uL 264 276 208  NEUTROABS 1.7 - 7.7 K/uL 2.8 3.3 3.6  LYMPHSABS 0.7 - 4.0 K/uL 1.4 1.7 1.6     Body mass index is 37.91 kg/m.  Orders:  No orders of the defined types were placed in this encounter.  No orders of the defined types were placed in this encounter.    Procedures: No procedures performed  Clinical Data: No additional findings.  ROS:  All other systems negative, except as noted in the HPI. Review of Systems  Objective: Vital Signs: Ht 5\' 3"  (1.6 m)   Wt 214 lb (97.1 kg)   BMI 37.91 kg/m   Specialty Comments:  No specialty comments available.  PMFS History: Patient Active Problem List   Diagnosis Date Noted  . Primary osteoarthritis, right shoulder 12/04/2019  . Chronic right shoulder pain 11/29/2018  . Localized swelling of left lower extremity 07/19/2018  . Cellulitis and abscess of left leg 06/15/2018  . Unilateral primary osteoarthritis, left knee 05/02/2017  . S/P total  knee replacement using cement, left 05/02/2017  . Primary osteoarthritis of left knee 12/21/2016  . Chondromalacia patellae, left knee 05/26/2016  . Breast cancer of upper-inner quadrant of right female breast ([place]) 11/04/2015  . GERD (gastroesophageal reflux disease) 09/02/2013  . Obesity 09/02/2013  . Ventral hernia, recurrent 09/02/2013  . Unspecified asthma(493.90) 12/12/2012  . Cough variant asthma 03/27/2012  . Right middle lobe syndrome 03/27/2012   Past Medical History:  Diagnosis Date  . Arthritis   . Asthma   . Breast cancer ([place])   . Breast disorder    cancer  . Breast mass    benign  . Cellulitis   . GERD (gastroesophageal reflux disease)   . Heart  murmur    AS CHILD  OUTGREW IT  . History of hiatal hernia   . Hyperlipidemia   . Hypertension   . Impaired fasting glucose   . Rectocele   . Varicose vein of leg     Family History  Problem Relation Age of Onset  . Colon cancer Father   . Rectal cancer Father   . Prostate cancer Father   . Dementia Mother   . Osteoporosis Mother   . Healthy Daughter   . Healthy Son   . Pancreatic cancer Paternal Grandfather   . Heart disease Paternal Grandmother   . Heart disease Maternal Grandfather   . Breast cancer Maternal Grandmother   . Scoliosis Sister     Past Surgical History:  Procedure Laterality Date  . BREAST LUMPECTOMY WITH RADIOACTIVE SEED AND SENTINEL LYMPH NODE BIOPSY Right 11/11/2015   Procedure: RIGHT BREAST RADIOACTIVE SEED GUIDED  LUMPECTOMY AND RADIOACTIVE SEED TARGETED SENTINEL LYMPH NODE BIOPSY;  Surgeon: Stark Klein, MD;  Location: [facility];  Service: General;  Laterality: Right;  . BREAST SURGERY Left    breast biopies x4  . CHOLECYSTECTOMY  09/08/2010  . COLONOSCOPY    . COLONOSCOPY N/A 09/25/2013   Procedure: COLONOSCOPY;  Surgeon: Rogene Houston, MD;  [facility];  Service: Endoscopy;  Laterality: N/A;  100-moved to 1200 Ann to notify pt  . HERNIA REPAIR  12/24/2010, 12-2014   TOTAL 4  . LAPAROSCOPIC GASTRIC SLEEVE RESECTION  11-2013   at [facility]  2007/2008  . TONSILLECTOMY  1968  . TOTAL KNEE ARTHROPLASTY Left 05/02/2017  . TOTAL KNEE ARTHROPLASTY Left 05/02/2017   Procedure: LEFT TOTAL KNEE ARTHROPLASTY;  Surgeon: [name], MD;  Location: [facility];  Service: Orthopedics;  Laterality: Left;  . UPPER GASTROINTESTINAL ENDOSCOPY    . VAGINAL HYSTERECTOMY  1980s   Social History   Occupational History  . Occupation: retired    Fish farm manager: RETIRED    Comment: teacher  Tobacco Use  . Smoking status: Never Smoker  . Smokeless tobacco: Never Used  Vaping Use  . Vaping Use: Never used    Substance and Sexual Activity  . Alcohol use: No  . Drug use: No  . Sexual activity: Yes    Birth control/protection: Surgical    Comment: hyst

## 2020-01-29 DIAGNOSIS — J4540 Moderate persistent asthma, uncomplicated: Secondary | ICD-10-CM | POA: Diagnosis not present

## 2020-01-29 DIAGNOSIS — E7849 Other hyperlipidemia: Secondary | ICD-10-CM | POA: Diagnosis not present

## 2020-01-29 DIAGNOSIS — I10 Essential (primary) hypertension: Secondary | ICD-10-CM | POA: Diagnosis not present

## 2020-01-29 DIAGNOSIS — C50211 Malignant neoplasm of upper-inner quadrant of right female breast: Secondary | ICD-10-CM | POA: Diagnosis not present

## 2020-02-11 ENCOUNTER — Other Ambulatory Visit: Payer: Self-pay

## 2020-02-27 NOTE — Progress Notes (Signed)
Your procedure is scheduled on Tuesday August 3.  Report to [facility] Main Entrance "A" at 09:45 A.M., and check in at the Admitting office.  Call this number if you have problems the morning of surgery: [phone]  Call [phone] if you have any questions prior to your surgery date Monday-Friday 8am-4pm   Remember: Do not eat after midnight the night before your surgery  You may drink clear liquids until 08:45 A.M. the morning of your surgery.   Clear liquids allowed are: Water, Non-Citrus Juices (without pulp), Carbonated Beverages, Clear Tea, Black Coffee Only, and Gatorade   Please complete your PRE-SURGERY ENSURE that was provided to you by 08:45 A.M. the morning of your surgery.    Please, if able, drink it in one setting. DO NOT SIP.  Take these medicines the morning of surgery with A SIP OF WATER: anastrozole (ARIMIDEX)  esomeprazole (NEXIUM)    If needed:  Fluticasone-Umeclidin-Vilant (TRELEGY ELLIPTA) ----- Please bring all inhalers with you the day of surgery.    As of today, STOP taking any Aspirin (unless otherwise instructed by your surgeon), Aleve, Naproxen, Ibuprofen, Motrin, Advil, Goody's, BC's, all herbal medications, fish oil, and all vitamins.    The Morning of Surgery  Do not wear jewelry, make-up or nail polish.  Do not wear lotions, powders, or perfumes, or deodorant  Do not shave 48 hours prior to surgery.    Do not bring valuables to the hospital.  [facility] is not responsible for any belongings or valuables.  If you are a smoker, DO NOT Smoke 24 hours prior to surgery  If you wear a CPAP at night please bring your mask the morning of surgery   Remember that you must have someone to transport you home after your surgery, and remain with you for 24 hours if you are discharged the same day.   Please bring cases for contacts, glasses, hearing aids, dentures or bridgework because it cannot be worn into surgery.    Leave your suitcase in  the car.  After surgery it may be brought to your room.  For patients admitted to the hospital, discharge time will be determined by your treatment team.  Patients discharged the day of surgery will not be allowed to drive home.   Please read over the following fact sheets that you were given.                                  [facility]- Preparing for Total Shoulder Arthroplasty   Before surgery, you can play an important role. Because skin is not sterile, your skin needs to be as free of germs as possible. You can reduce the number of germs on your skin by using the following products. . Benzoyl Peroxide Gel o Reduces the number of germs present on the skin o Applied twice a day to shoulder area starting two days before surgery   . Chlorhexidine Gluconate (CHG) Soap o An antiseptic cleaner that kills germs and bonds with the skin to continue killing germs even after washing o Used for showering the night before surgery and morning of surgery   Oral Hygiene is also important to reduce your risk of infection.                                    Remember - BRUSH  YOUR TEETH THE MORNING OF SURGERY WITH YOUR REGULAR TOOTHPASTE  ==================================================================  Please follow these instructions carefully:  BENZOYL PEROXIDE 5% GEL  Please do not use if you have an allergy to benzoyl peroxide.   If your skin becomes reddened/irritated stop using the benzoyl peroxide.  Starting two days before surgery, apply as follows: 1. Apply benzoyl peroxide in the morning and at night. Apply after taking a shower. If you are not taking a shower clean entire shoulder front, back, and side along with the armpit with a clean wet washcloth.  2. Place a quarter-sized dollop on your shoulder and rub in thoroughly, making sure to cover the front, back, and side of your shoulder, along with the armpit.   2 days before ____ AM   ____ PM              1 day before ____ AM    ____ PM                          3.  Do this twice a day for two days.  (Last application is the night before surgery, AFTER using the CHG soap as described below).  4. Do NOT apply benzoyl peroxide gel on the day of surgery.  CHLORHEXIDINE GLUCONATE (CHG) SOAP  Please do not use if you have an allergy to CHG or antibacterial soaps. If your skin becomes reddened/irritated stop using the CHG.   Do not shave (including legs and underarms) for at least 48 hours prior to first CHG shower. It is OK to shave your face.  Starting the night before surgery, use CHG soap as follows:  1. Shower the NIGHT BEFORE SURGERY and MORNING OF SURGERY with CHG.  2. If you choose to wash your hair, wash your hair first as usual with your normal shampoo.  3. After shampooing, rinse your hair and body thoroughly to remove the shampoo.  4. Use CHG as you would any other liquid soap.  You can apply CHG directly to the skin and wash gently with a scrungie or a clean washcloth.  5. Apply the CHG soap to your body ONLY FROM THE NECK DOWN.  Do not use on open wounds or open sores.  Avoid contact with your eyes, ears, mouth, and genitals (private parts).  Wash face and genitals (private parts) with your normal soap.  6. Wash thoroughly, paying special attention to the area where your surgery will be performed.  7. Thoroughly rinse your body with warm water from the neck down.  8. DO NOT shower/wash with your normal soap after using and rinsing off the CHG soap.   9. Pat yourself dry with a CLEAN TOWEL.   10.  Apply benzoyl peroxide.   11. Wear CLEAN PAJAMAS to bed the night before surgery  12. Place CLEAN SHEETS on your bed the night of your first shower and DO NOT SLEEP WITH PETS.  13. Comfortable clothes on the morning of surgery  Day of Surgery: Shower as above Do not apply any deodorants/lotions.  Please wear clean clothes to the hospital/surgery center.   Remember to brush your teeth WITH YOUR  REGULAR TOOTHPASTE.

## 2020-02-28 ENCOUNTER — Encounter (HOSPITAL_COMMUNITY): Payer: Self-pay

## 2020-02-28 ENCOUNTER — Encounter (HOSPITAL_COMMUNITY)
Admission: RE | Admit: 2020-02-28 | Discharge: 2020-02-28 | Disposition: A | Discharge status: Home / Self Care | Payer: Medicare Other | Source: Ambulatory Visit | Attending: Orthopedic Surgery | Admitting: Orthopedic Surgery

## 2020-02-28 ENCOUNTER — Other Ambulatory Visit (HOSPITAL_COMMUNITY)
Admission: RE | Admit: 2020-02-28 | Discharge: 2020-02-28 | Disposition: A | Discharge status: Home / Self Care | Payer: Medicare Other | Source: Ambulatory Visit | Attending: Orthopedic Surgery | Admitting: Orthopedic Surgery

## 2020-02-28 ENCOUNTER — Other Ambulatory Visit: Payer: Self-pay

## 2020-02-28 DIAGNOSIS — Z01812 Encounter for preprocedural laboratory examination: Secondary | ICD-10-CM | POA: Diagnosis not present

## 2020-02-28 DIAGNOSIS — Z20822 Contact with and (suspected) exposure to covid-19: Secondary | ICD-10-CM | POA: Insufficient documentation

## 2020-02-28 DIAGNOSIS — J4540 Moderate persistent asthma, uncomplicated: Secondary | ICD-10-CM | POA: Diagnosis not present

## 2020-02-28 DIAGNOSIS — I10 Essential (primary) hypertension: Secondary | ICD-10-CM | POA: Diagnosis not present

## 2020-02-28 DIAGNOSIS — E7849 Other hyperlipidemia: Secondary | ICD-10-CM | POA: Diagnosis not present

## 2020-02-28 DIAGNOSIS — I493 Ventricular premature depolarization: Secondary | ICD-10-CM | POA: Insufficient documentation

## 2020-02-28 DIAGNOSIS — C50211 Malignant neoplasm of upper-inner quadrant of right female breast: Secondary | ICD-10-CM | POA: Diagnosis not present

## 2020-02-28 DIAGNOSIS — Z01810 Encounter for preprocedural cardiovascular examination: Secondary | ICD-10-CM | POA: Insufficient documentation

## 2020-02-28 LAB — BASIC METABOLIC PANEL
Anion gap: 9 (ref 5–15)
BUN: 8 mg/dL (ref 8–23)
CO2: 29 mmol/L (ref 22–32)
Calcium: 9.8 mg/dL (ref 8.9–10.3)
Chloride: 93 mmol/L — ABNORMAL LOW (ref 98–111)
Creatinine, Ser: 0.63 mg/dL (ref 0.44–1.00)
GFR calc Af Amer: >60 mL/min (ref >60)
GFR calc non Af Amer: >60 mL/min (ref >60)
Glucose, Bld: 106 mg/dL — ABNORMAL HIGH (ref 70–99)
Potassium: 3.8 mmol/L (ref 3.5–5.1)
Sodium: 131 mmol/L — ABNORMAL LOW (ref 135–145)

## 2020-02-28 LAB — URINALYSIS, ROUTINE W REFLEX MICROSCOPIC
Bilirubin Urine: NEGATIVE
Glucose, UA: NEGATIVE mg/dL
Hgb urine dipstick: NEGATIVE
Ketones, ur: NEGATIVE mg/dL
Nitrite: NEGATIVE
Protein, ur: NEGATIVE mg/dL
Specific Gravity, Urine: 1.005 (ref 1.005–1.030)
pH: 8 (ref 5.0–8.0)

## 2020-02-28 LAB — SURGICAL PCR SCREEN
MRSA, PCR: NEGATIVE
Staphylococcus aureus: NEGATIVE

## 2020-02-28 LAB — CBC
HCT: 36.1 % (ref 36.0–46.0)
Hemoglobin: 12.1 g/dL (ref 12.0–15.0)
MCH: 34.1 pg — ABNORMAL HIGH (ref 26.0–34.0)
MCHC: 33.5 g/dL (ref 30.0–36.0)
MCV: 101.7 fL — ABNORMAL HIGH (ref 80.0–100.0)
Platelets: 282 10*3/uL (ref 150–400)
RBC: 3.55 MIL/uL — ABNORMAL LOW (ref 3.87–5.11)
RDW: 12.5 % (ref 11.5–15.5)
WBC: 5.8 10*3/uL (ref 4.0–10.5)
nRBC: 0 % (ref 0.0–0.2)

## 2020-02-28 LAB — SARS CORONAVIRUS 2 (TAT 6-24 HRS): SARS Coronavirus 2: NEGATIVE

## 2020-02-28 NOTE — Progress Notes (Signed)
PCP - Sharilyn Sites. MD Cardiologist - pt denies   Chest x-ray - n/a EKG - 02/28/20 Stress Test - pt denies ECHO - pt denies Cardiac Cath - pt denies    Blood Thinner Instructions: n/a Aspirin Instructions:n/a  ERAS Protcol - yes PRE-SURGERY Ensure or G2- ensure   COVID TEST-  02/28/20  Coronavirus Screening  Have you experienced the following symptoms:  Cough yes/no: No Fever (>100.40F)  yes/no: No Runny nose yes/no: No Sore throat yes/no: No Difficulty breathing/shortness of breath  yes/no: No  Have you or a family member traveled in the last 14 days and where? yes/no: No   If the patient indicates "YES" to the above questions, their PAT will be rescheduled to limit the exposure to others and, the surgeon will be notified. THE PATIENT WILL NEED TO BE ASYMPTOMATIC FOR 14 DAYS.   If the patient is not experiencing any of these symptoms, the PAT nurse will instruct them to NOT bring anyone with them to their appointment since they may have these symptoms or traveled as well.   Please remind your patients and families that hospital visitation restrictions are in effect and the importance of the restrictions.     Anesthesia review: n/a  Patient denies shortness of breath, fever, cough and chest pain at PAT appointment   All instructions explained to the patient, with a verbal understanding of the material. Patient agrees to go over the instructions while at home for a better understanding. Patient also instructed to self quarantine after being tested for COVID-19. The opportunity to ask questions was provided.

## 2020-02-29 LAB — URINE CULTURE: Culture: NO GROWTH

## 2020-03-03 ENCOUNTER — Observation Stay (HOSPITAL_COMMUNITY): Payer: Medicare Other

## 2020-03-03 ENCOUNTER — Ambulatory Visit (HOSPITAL_COMMUNITY): Payer: Medicare Other | Admitting: Certified Registered Nurse Anesthetist

## 2020-03-03 ENCOUNTER — Other Ambulatory Visit: Payer: Self-pay

## 2020-03-03 ENCOUNTER — Observation Stay (HOSPITAL_COMMUNITY)
Admission: RE | Admit: 2020-03-03 | Discharge: 2020-03-04 | Disposition: A | Discharge status: Home / Self Care | Payer: Medicare Other | Attending: Orthopedic Surgery | Admitting: Orthopedic Surgery

## 2020-03-03 ENCOUNTER — Encounter (HOSPITAL_COMMUNITY)
Admission: RE | Disposition: A | Discharge status: Home / Self Care | Payer: Self-pay | Source: Home / Self Care | Attending: Orthopedic Surgery

## 2020-03-03 ENCOUNTER — Encounter (HOSPITAL_COMMUNITY): Payer: Self-pay | Admitting: Orthopedic Surgery

## 2020-03-03 DIAGNOSIS — Z79899 Other long term (current) drug therapy: Secondary | ICD-10-CM | POA: Insufficient documentation

## 2020-03-03 DIAGNOSIS — Z96611 Presence of right artificial shoulder joint: Secondary | ICD-10-CM

## 2020-03-03 DIAGNOSIS — I10 Essential (primary) hypertension: Secondary | ICD-10-CM | POA: Insufficient documentation

## 2020-03-03 DIAGNOSIS — Z853 Personal history of malignant neoplasm of breast: Secondary | ICD-10-CM | POA: Insufficient documentation

## 2020-03-03 DIAGNOSIS — J45909 Unspecified asthma, uncomplicated: Secondary | ICD-10-CM | POA: Insufficient documentation

## 2020-03-03 DIAGNOSIS — Z96652 Presence of left artificial knee joint: Secondary | ICD-10-CM | POA: Diagnosis not present

## 2020-03-03 DIAGNOSIS — Z9889 Other specified postprocedural states: Secondary | ICD-10-CM

## 2020-03-03 DIAGNOSIS — G8918 Other acute postprocedural pain: Secondary | ICD-10-CM | POA: Diagnosis not present

## 2020-03-03 DIAGNOSIS — M19011 Primary osteoarthritis, right shoulder: Secondary | ICD-10-CM | POA: Diagnosis not present

## 2020-03-03 DIAGNOSIS — M25511 Pain in right shoulder: Secondary | ICD-10-CM | POA: Diagnosis present

## 2020-03-03 DIAGNOSIS — Z471 Aftercare following joint replacement surgery: Secondary | ICD-10-CM | POA: Diagnosis not present

## 2020-03-03 HISTORY — PX: REVERSE SHOULDER ARTHROPLASTY: SHX5054

## 2020-03-03 SURGERY — ARTHROPLASTY, SHOULDER, TOTAL, REVERSE
Anesthesia: Regional | Site: Shoulder | Laterality: Right

## 2020-03-03 MED ORDER — PRAVASTATIN SODIUM 40 MG PO TABS
40.0000 mg | ORAL_TABLET | Freq: Every day | ORAL | Status: DC
  Administered 2020-03-03: 40 mg via ORAL
  Filled 2020-03-03: qty 1

## 2020-03-03 MED ORDER — METHOCARBAMOL 1000 MG/10ML IJ SOLN: 500.0000 mg | Freq: Four times a day (QID) | INTRAVENOUS | Status: DC | PRN

## 2020-03-03 MED ORDER — ROCURONIUM BROMIDE 10 MG/ML (PF) SYRINGE
PREFILLED_SYRINGE | INTRAVENOUS | Status: DC | PRN
  Administered 2020-03-03: 50 mg via INTRAVENOUS

## 2020-03-03 MED ORDER — OXYCODONE HCL 5 MG PO TABS: 5.0000 mg | ORAL_TABLET | Freq: Once | ORAL | Status: DC | PRN

## 2020-03-03 MED ORDER — FENTANYL CITRATE (PF) 250 MCG/5ML IJ SOLN
INTRAMUSCULAR | Status: DC | PRN
  Administered 2020-03-03 (×3): 50 ug via INTRAVENOUS

## 2020-03-03 MED ORDER — FENTANYL CITRATE (PF) 100 MCG/2ML IJ SOLN
INTRAMUSCULAR | Status: AC
  Administered 2020-03-03: 50 ug via INTRAVENOUS
  Filled 2020-03-03: qty 2

## 2020-03-03 MED ORDER — PHENYLEPHRINE HCL-NACL 10-0.9 MG/250ML-% IV SOLN
INTRAVENOUS | Status: DC | PRN
  Administered 2020-03-03: 25 ug/min via INTRAVENOUS

## 2020-03-03 MED ORDER — INSULIN ASPART 100 UNIT/ML ~~LOC~~ SOLN: 0.0000 [IU] | Freq: Three times a day (TID) | SUBCUTANEOUS | Status: DC

## 2020-03-03 MED ORDER — VANCOMYCIN HCL 1000 MG IV SOLR
INTRAVENOUS | Status: AC
  Filled 2020-03-03: qty 1000

## 2020-03-03 MED ORDER — PROPOFOL 10 MG/ML IV BOLUS
INTRAVENOUS | Status: AC
  Filled 2020-03-03: qty 20

## 2020-03-03 MED ORDER — DOCUSATE SODIUM 100 MG PO CAPS
100.0000 mg | ORAL_CAPSULE | Freq: Two times a day (BID) | ORAL | Status: DC
  Administered 2020-03-03 – 2020-03-04 (×2): 100 mg via ORAL
  Filled 2020-03-03 (×2): qty 1

## 2020-03-03 MED ORDER — PROPOFOL 10 MG/ML IV BOLUS
INTRAVENOUS | Status: DC | PRN
  Administered 2020-03-03: 20 mg via INTRAVENOUS
  Administered 2020-03-03: 140 mg via INTRAVENOUS

## 2020-03-03 MED ORDER — ONDANSETRON HCL 4 MG/2ML IJ SOLN
INTRAMUSCULAR | Status: AC
  Filled 2020-03-03: qty 2

## 2020-03-03 MED ORDER — ACETAMINOPHEN 500 MG PO TABS
1000.0000 mg | ORAL_TABLET | Freq: Four times a day (QID) | ORAL | Status: AC
  Administered 2020-03-03 – 2020-03-04 (×4): 1000 mg via ORAL
  Filled 2020-03-03 (×4): qty 2

## 2020-03-03 MED ORDER — ACETAMINOPHEN 500 MG PO TABS
1000.0000 mg | ORAL_TABLET | Freq: Once | ORAL | Status: AC
  Administered 2020-03-03: 1000 mg via ORAL
  Filled 2020-03-03: qty 2

## 2020-03-03 MED ORDER — ORAL CARE MOUTH RINSE: 15.0000 mL | Freq: Once | OROMUCOSAL | Status: AC

## 2020-03-03 MED ORDER — LIDOCAINE 2% (20 MG/ML) 5 ML SYRINGE
INTRAMUSCULAR | Status: DC | PRN
  Administered 2020-03-03: 20 mg via INTRAVENOUS

## 2020-03-03 MED ORDER — CEFAZOLIN SODIUM-DEXTROSE 2-4 GM/100ML-% IV SOLN
INTRAVENOUS | Status: AC
  Filled 2020-03-03: qty 100

## 2020-03-03 MED ORDER — CEFAZOLIN SODIUM-DEXTROSE 1-4 GM/50ML-% IV SOLN
1.0000 g | Freq: Four times a day (QID) | INTRAVENOUS | Status: AC
  Administered 2020-03-03 – 2020-03-04 (×3): 1 g via INTRAVENOUS
  Filled 2020-03-03 (×3): qty 50

## 2020-03-03 MED ORDER — ONDANSETRON HCL 4 MG/2ML IJ SOLN: 4.0000 mg | Freq: Once | INTRAMUSCULAR | Status: DC | PRN

## 2020-03-03 MED ORDER — PANTOPRAZOLE SODIUM 40 MG PO TBEC
40.0000 mg | DELAYED_RELEASE_TABLET | Freq: Every day | ORAL | Status: DC
  Administered 2020-03-04: 40 mg via ORAL
  Filled 2020-03-03: qty 1

## 2020-03-03 MED ORDER — SUGAMMADEX SODIUM 200 MG/2ML IV SOLN
INTRAVENOUS | Status: DC | PRN
  Administered 2020-03-03: 200 mg via INTRAVENOUS

## 2020-03-03 MED ORDER — POVIDONE-IODINE 7.5 % EX SOLN
Freq: Once | CUTANEOUS | Status: DC
  Filled 2020-03-03: qty 118

## 2020-03-03 MED ORDER — 0.9 % SODIUM CHLORIDE (POUR BTL) OPTIME
TOPICAL | Status: DC | PRN
  Administered 2020-03-03 (×6): 1000 mL

## 2020-03-03 MED ORDER — HYDROMORPHONE HCL 1 MG/ML IJ SOLN
INTRAMUSCULAR | Status: AC
  Filled 2020-03-03: qty 1

## 2020-03-03 MED ORDER — LIDOCAINE 2% (20 MG/ML) 5 ML SYRINGE
INTRAMUSCULAR | Status: AC
  Filled 2020-03-03: qty 5

## 2020-03-03 MED ORDER — DEXAMETHASONE SODIUM PHOSPHATE 10 MG/ML IJ SOLN
INTRAMUSCULAR | Status: DC | PRN
  Administered 2020-03-03: 4 mg via INTRAVENOUS

## 2020-03-03 MED ORDER — HYDROMORPHONE HCL 1 MG/ML IJ SOLN
0.2500 mg | INTRAMUSCULAR | Status: DC | PRN
  Administered 2020-03-03 (×2): 0.5 mg via INTRAVENOUS

## 2020-03-03 MED ORDER — MIDAZOLAM HCL 5 MG/5ML IJ SOLN
INTRAMUSCULAR | Status: DC | PRN
  Administered 2020-03-03: 1 mg via INTRAVENOUS

## 2020-03-03 MED ORDER — OXYCODONE HCL 5 MG PO TABS
5.0000 mg | ORAL_TABLET | ORAL | Status: DC | PRN
  Administered 2020-03-04: 5 mg via ORAL
  Filled 2020-03-03: qty 1

## 2020-03-03 MED ORDER — MELATONIN 5 MG PO TABS: 10.0000 mg | ORAL_TABLET | Freq: Every evening | ORAL | Status: DC | PRN

## 2020-03-03 MED ORDER — MIDAZOLAM HCL 2 MG/2ML IJ SOLN
INTRAMUSCULAR | Status: AC
  Administered 2020-03-03: 1 mg via INTRAVENOUS
  Filled 2020-03-03: qty 2

## 2020-03-03 MED ORDER — CHLORHEXIDINE GLUCONATE 0.12 % MT SOLN
OROMUCOSAL | Status: AC
  Administered 2020-03-03: 15 mL via OROMUCOSAL
  Filled 2020-03-03: qty 15

## 2020-03-03 MED ORDER — DEXAMETHASONE SODIUM PHOSPHATE 10 MG/ML IJ SOLN
INTRAMUSCULAR | Status: AC
  Filled 2020-03-03: qty 1

## 2020-03-03 MED ORDER — TRANEXAMIC ACID-NACL 1000-0.7 MG/100ML-% IV SOLN
1000.0000 mg | INTRAVENOUS | Status: AC
  Administered 2020-03-03: 1000 mg via INTRAVENOUS

## 2020-03-03 MED ORDER — ASPIRIN EC 81 MG PO TBEC
81.0000 mg | DELAYED_RELEASE_TABLET | Freq: Every day | ORAL | Status: DC
  Administered 2020-03-03 – 2020-03-04 (×2): 81 mg via ORAL
  Filled 2020-03-03 (×2): qty 1

## 2020-03-03 MED ORDER — FENTANYL CITRATE (PF) 100 MCG/2ML IJ SOLN: 50.0000 ug | Freq: Once | INTRAMUSCULAR | Status: AC

## 2020-03-03 MED ORDER — ONDANSETRON HCL 4 MG/2ML IJ SOLN
INTRAMUSCULAR | Status: DC | PRN
  Administered 2020-03-03: 4 mg via INTRAVENOUS

## 2020-03-03 MED ORDER — LOSARTAN POTASSIUM 50 MG PO TABS
100.0000 mg | ORAL_TABLET | Freq: Every day | ORAL | Status: DC
  Administered 2020-03-03 – 2020-03-04 (×2): 100 mg via ORAL
  Filled 2020-03-03 (×2): qty 2

## 2020-03-03 MED ORDER — METOCLOPRAMIDE HCL 5 MG PO TABS: 5.0000 mg | ORAL_TABLET | Freq: Three times a day (TID) | ORAL | Status: DC | PRN

## 2020-03-03 MED ORDER — CEFAZOLIN SODIUM-DEXTROSE 2-4 GM/100ML-% IV SOLN
2.0000 g | INTRAVENOUS | Status: AC
  Administered 2020-03-03: 2 g via INTRAVENOUS

## 2020-03-03 MED ORDER — HYDROCHLOROTHIAZIDE 25 MG PO TABS
25.0000 mg | ORAL_TABLET | Freq: Every day | ORAL | Status: DC
  Administered 2020-03-03 – 2020-03-04 (×2): 25 mg via ORAL
  Filled 2020-03-03 (×2): qty 1

## 2020-03-03 MED ORDER — METHOCARBAMOL 500 MG PO TABS: 500.0000 mg | ORAL_TABLET | Freq: Four times a day (QID) | ORAL | Status: DC | PRN

## 2020-03-03 MED ORDER — POVIDONE-IODINE 10 % EX SWAB: 2.0000 "application " | Freq: Once | CUTANEOUS | Status: DC

## 2020-03-03 MED ORDER — ANASTROZOLE 1 MG PO TABS
1.0000 mg | ORAL_TABLET | Freq: Every day | ORAL | Status: DC
  Administered 2020-03-04: 1 mg via ORAL
  Filled 2020-03-03: qty 1

## 2020-03-03 MED ORDER — MIDAZOLAM HCL 2 MG/2ML IJ SOLN: 1.0000 mg | Freq: Once | INTRAMUSCULAR | Status: AC

## 2020-03-03 MED ORDER — METOCLOPRAMIDE HCL 5 MG/ML IJ SOLN: 5.0000 mg | Freq: Three times a day (TID) | INTRAMUSCULAR | Status: DC | PRN

## 2020-03-03 MED ORDER — OXYCODONE HCL 5 MG/5ML PO SOLN: 5.0000 mg | Freq: Once | ORAL | Status: DC | PRN

## 2020-03-03 MED ORDER — INSULIN ASPART 100 UNIT/ML ~~LOC~~ SOLN: 4.0000 [IU] | Freq: Three times a day (TID) | SUBCUTANEOUS | Status: DC

## 2020-03-03 MED ORDER — LOSARTAN POTASSIUM-HCTZ 100-25 MG PO TABS: 1.0000 | ORAL_TABLET | Freq: Every day | ORAL | Status: DC

## 2020-03-03 MED ORDER — FENTANYL CITRATE (PF) 250 MCG/5ML IJ SOLN
INTRAMUSCULAR | Status: AC
  Filled 2020-03-03: qty 5

## 2020-03-03 MED ORDER — IRRISEPT - 450ML BOTTLE WITH 0.05% CHG IN STERILE WATER, USP 99.95% OPTIME
TOPICAL | Status: DC | PRN
  Administered 2020-03-03: 450 mL via TOPICAL

## 2020-03-03 MED ORDER — HYDROMORPHONE HCL 1 MG/ML IJ SOLN: 0.5000 mg | INTRAMUSCULAR | Status: DC | PRN

## 2020-03-03 MED ORDER — ONDANSETRON HCL 4 MG PO TABS: 4.0000 mg | ORAL_TABLET | Freq: Four times a day (QID) | ORAL | Status: DC | PRN

## 2020-03-03 MED ORDER — PHENOL 1.4 % MT LIQD: 1.0000 | OROMUCOSAL | Status: DC | PRN

## 2020-03-03 MED ORDER — LACTATED RINGERS IV SOLN: INTRAVENOUS | Status: DC

## 2020-03-03 MED ORDER — ESMOLOL HCL 100 MG/10ML IV SOLN
INTRAVENOUS | Status: DC | PRN
Start: 2020-03-03 — End: 2020-03-03
  Administered 2020-03-03: 40 mg via INTRAVENOUS
  Administered 2020-03-03 (×3): 20 mg via INTRAVENOUS

## 2020-03-03 MED ORDER — TRANEXAMIC ACID-NACL 1000-0.7 MG/100ML-% IV SOLN
INTRAVENOUS | Status: AC
  Filled 2020-03-03: qty 100

## 2020-03-03 MED ORDER — ONDANSETRON HCL 4 MG/2ML IJ SOLN: 4.0000 mg | Freq: Four times a day (QID) | INTRAMUSCULAR | Status: DC | PRN

## 2020-03-03 MED ORDER — VANCOMYCIN HCL 1000 MG IV SOLR
INTRAVENOUS | Status: DC | PRN
  Administered 2020-03-03: 1000 mg via TOPICAL

## 2020-03-03 MED ORDER — MIDAZOLAM HCL 2 MG/2ML IJ SOLN
INTRAMUSCULAR | Status: AC
  Filled 2020-03-03: qty 2

## 2020-03-03 MED ORDER — MENTHOL 3 MG MT LOZG: 1.0000 | LOZENGE | OROMUCOSAL | Status: DC | PRN

## 2020-03-03 MED ORDER — ROCURONIUM BROMIDE 10 MG/ML (PF) SYRINGE
PREFILLED_SYRINGE | INTRAVENOUS | Status: AC
  Filled 2020-03-03: qty 10

## 2020-03-03 MED ORDER — CHLORHEXIDINE GLUCONATE 0.12 % MT SOLN: 15.0000 mL | Freq: Once | OROMUCOSAL | Status: AC

## 2020-03-03 SURGICAL SUPPLY — 85 items
AID PSTN UNV HD RSTRNT DISP (MISCELLANEOUS) ×1
ALCOHOL 70% 16 OZ (MISCELLANEOUS) ×3 IMPLANT
APL PRP STRL LF DISP 70% ISPRP (MISCELLANEOUS) ×1
BIT DRILL F/CENTRAL SCRW 3.2 (BIT) ×1
BIT DRILL F/CENTRAL SCRW 3.2MM (BIT) ×1 IMPLANT
BIT DRILL TWIST 2.7 (BIT) ×1 IMPLANT
BIT DRILL TWIST 2.7MM (BIT) ×1
BLADE SAW SGTL 13X75X1.27 (BLADE) ×3 IMPLANT
BRNG HUM +3 36 RVRS SHLDR (Shoulder) ×1 IMPLANT
BSPLAT GLND LRG AUG TPR ADPR (Joint) ×1 IMPLANT
CHLORAPREP W/TINT 26 (MISCELLANEOUS) ×3 IMPLANT
CLOSURE WOUND 1/2 X4 (GAUZE/BANDAGES/DRESSINGS) ×1
COMP REV AUG LG W/TAPER/GLENOI (Joint) ×3 IMPLANT
COMPONENT RV AUG LG W/TAPR/GLN (Joint) IMPLANT
COVER SURGICAL LIGHT HANDLE (MISCELLANEOUS) ×3 IMPLANT
COVER WAND RF STERILE (DRAPES) ×3 IMPLANT
DRAPE INCISE IOBAN 66X45 STRL (DRAPES) ×3 IMPLANT
DRAPE U-SHAPE 47X51 STRL (DRAPES) ×6 IMPLANT
DRILL BIT F/CENTRAL SCRW 3.2MM (BIT) ×3
DRSG AQUACEL AG ADV 3.5X10 (GAUZE/BANDAGES/DRESSINGS) ×3 IMPLANT
ELECT BLADE 4.0 EZ CLEAN MEGAD (MISCELLANEOUS) ×3
ELECT REM PT RETURN 9FT ADLT (ELECTROSURGICAL) ×3
ELECTRODE BLDE 4.0 EZ CLN MEGD (MISCELLANEOUS) ×1 IMPLANT
ELECTRODE REM PT RTRN 9FT ADLT (ELECTROSURGICAL) ×1 IMPLANT
GAUZE SPONGE 4X4 12PLY STRL LF (GAUZE/BANDAGES/DRESSINGS) ×3 IMPLANT
GLENOID SPHERE 36MM CVD +3 (Orthopedic Implant) ×2 IMPLANT
GLOVE BIOGEL PI IND STRL 7.0 (GLOVE) ×1 IMPLANT
GLOVE BIOGEL PI IND STRL 8 (GLOVE) ×1 IMPLANT
GLOVE BIOGEL PI INDICATOR 7.0 (GLOVE) ×2
GLOVE BIOGEL PI INDICATOR 8 (GLOVE) ×2
GLOVE ECLIPSE 7.0 STRL STRAW (GLOVE) ×3 IMPLANT
GLOVE ECLIPSE 8.0 STRL XLNG CF (GLOVE) ×3 IMPLANT
GOWN STRL REUS W/ TWL LRG LVL3 (GOWN DISPOSABLE) ×2 IMPLANT
GOWN STRL REUS W/ TWL XL LVL3 (GOWN DISPOSABLE) IMPLANT
GOWN STRL REUS W/TWL LRG LVL3 (GOWN DISPOSABLE) ×6
GOWN STRL REUS W/TWL XL LVL3 (GOWN DISPOSABLE)
GUIDE MODEL REV SHLD RT (ORTHOPEDIC DISPOSABLE SUPPLIES) ×2 IMPLANT
HYDROGEN PEROXIDE 16OZ (MISCELLANEOUS) ×3 IMPLANT
JET LAVAGE IRRISEPT WOUND (IRRIGATION / IRRIGATOR) ×3
KIT BASIN OR (CUSTOM PROCEDURE TRAY) ×3 IMPLANT
KIT TURNOVER KIT B (KITS) ×3 IMPLANT
LAVAGE JET IRRISEPT WOUND (IRRIGATION / IRRIGATOR) ×1 IMPLANT
LOOP VESSEL MAXI BLUE (MISCELLANEOUS) ×3 IMPLANT
MANIFOLD NEPTUNE II (INSTRUMENTS) ×3 IMPLANT
NDL SUT 6 .5 CRC .975X.05 MAYO (NEEDLE) IMPLANT
NDL TAPERED W/ NITINOL LOOP (MISCELLANEOUS) ×1 IMPLANT
NEEDLE MAYO TAPER (NEEDLE)
NEEDLE TAPERED W/ NITINOL LOOP (MISCELLANEOUS) ×3 IMPLANT
NS IRRIG 1000ML POUR BTL (IV SOLUTION) ×3 IMPLANT
PACK SHOULDER (CUSTOM PROCEDURE TRAY) ×3 IMPLANT
PAD ARMBOARD 7.5X6 YLW CONV (MISCELLANEOUS) ×6 IMPLANT
PASSER SUT SWANSON 36MM LOOP (INSTRUMENTS) ×3 IMPLANT
PIN STEINMANN THREADED TIP (PIN) ×2 IMPLANT
PIN THREADED REVERSE (PIN) ×2 IMPLANT
REAMER GUIDE W/SCREW AUG (MISCELLANEOUS) ×2 IMPLANT
RESTRAINT HEAD UNIVERSAL NS (MISCELLANEOUS) ×3 IMPLANT
SCREW BONE STRL 6.5MMX30MM (Screw) ×2 IMPLANT
SCREW LOCKING 4.75MMX15MM (Screw) ×2 IMPLANT
SCREW LOCKING NS 4.75MMX20MM (Screw) ×4 IMPLANT
SLING ARM IMMOBILIZER LRG (SOFTGOODS) ×3 IMPLANT
SOL PREP POV-IOD 4OZ 10% (MISCELLANEOUS) ×3 IMPLANT
SPONGE LAP 18X18 RF (DISPOSABLE) ×3 IMPLANT
STEM SHOULDER (Stem) ×2 IMPLANT
STRIP CLOSURE SKIN 1/2X4 (GAUZE/BANDAGES/DRESSINGS) ×2 IMPLANT
SUCTION FRAZIER HANDLE 10FR (MISCELLANEOUS) ×3
SUCTION TUBE FRAZIER 10FR DISP (MISCELLANEOUS) ×1 IMPLANT
SUT BROADBAND TAPE 2PK 1.5 (SUTURE) ×6 IMPLANT
SUT FIBERWIRE #2 38 T-5 BLUE (SUTURE)
SUT MAXBRAID (SUTURE) IMPLANT
SUT MNCRL AB 3-0 PS2 18 (SUTURE) ×3 IMPLANT
SUT SILK 2 0 TIES 10X30 (SUTURE) ×3 IMPLANT
SUT VIC AB 0 CT1 27 (SUTURE) ×12
SUT VIC AB 0 CT1 27XBRD ANBCTR (SUTURE) ×4 IMPLANT
SUT VIC AB 1 CT1 27 (SUTURE) ×6
SUT VIC AB 1 CT1 27XBRD ANBCTR (SUTURE) ×2 IMPLANT
SUT VIC AB 2-0 CT1 27 (SUTURE) ×9
SUT VIC AB 2-0 CT1 TAPERPNT 27 (SUTURE) ×3 IMPLANT
SUT VICRYL 0 UR6 27IN ABS (SUTURE) ×6 IMPLANT
SUTURE FIBERWR #2 38 T-5 BLUE (SUTURE) IMPLANT
TOWEL GREEN STERILE (TOWEL DISPOSABLE) ×3 IMPLANT
TRAY FOL W/BAG SLVR 16FR STRL (SET/KITS/TRAYS/PACK) IMPLANT
TRAY FOLEY W/BAG SLVR 16FR LF (SET/KITS/TRAYS/PACK)
TRAY HUM REV SHOULDER 36 +3 (Shoulder) ×2 IMPLANT
TRAY HUM REV SHOULDER STD +6 (Shoulder) ×2 IMPLANT
WATER STERILE IRR 1000ML POUR (IV SOLUTION) ×3 IMPLANT

## 2020-03-03 NOTE — Anesthesia Postprocedure Evaluation (Signed)
Anesthesia Post Note  Patient: Caitlin [name]  Procedure(s) Performed: RIGHT REVERSE SHOULDER ARTHROPLASTY (Right Shoulder)     Patient location during evaluation: PACU Anesthesia Type: General Level of consciousness: awake and alert Pain management: pain level controlled Vital Signs Assessment: post-procedure vital signs reviewed and stable Respiratory status: spontaneous breathing, nonlabored ventilation, respiratory function stable and patient connected to nasal cannula oxygen Cardiovascular status: blood pressure returned to baseline and stable Postop Assessment: no apparent nausea or vomiting Anesthetic complications: no   No complications documented.  Last Vitals:  Vitals:   03/03/20 1541 03/03/20 1554  BP: 138/75 139/72  Pulse: 88 90  Resp: 17 16  Temp: 36.6 C 36.8 C  SpO2: 91% 93%    Last Pain:  Vitals:   03/03/20 1554  TempSrc: Oral  PainSc:                  Tiajuana Amass

## 2020-03-03 NOTE — Op Note (Signed)
NAME: Caitlin [name], Caitlin [name] MEDICAL RECORD IH:[id number] ACCOUNT [id number] DATE OF BIRTH:1946-07-01 FACILITY: [facility] LOCATION: [facility] PHYSICIAN:Tallula Grindle Randel Pigg, MD  OPERATIVE REPORT  DATE OF PROCEDURE:  03/03/2020  PREOPERATIVE DIAGNOSIS:  Right shoulder arthritis.  POSTOPERATIVE DIAGNOSIS:  Right shoulder arthritis.  PROCEDURE:  Right reverse shoulder replacement utilizing [employer] comprehensive shoulder system components, mini humeral stem, 10 x 83 with mini humeral tray, standard thickness, +6 taper offset, 40 mm in diameter with 36 mm diameter highly cross-linked  polyethylene bearing, +3 in thickness and large augmented baseplate with 1 central compression screw, 4 peripheral locking screws and 36+3 glenosphere.  SURGEON:  Meredith Pel, MD  ASSISTANT:  Annie Main, PA.  INDICATIONS:  The patient is a 74 year old patient with end-stage shoulder arthritis who presents for operative management after explanation of risks and benefits.  The patient did have severe preoperative glenoid deformity with retroversion of 34  degrees.  Patient-specific instrumentation and planning was utilized to optimize component position.  PROCEDURE IN DETAIL:  The patient was brought to the operating room where general anesthetic was induced.  Preoperative antibiotics administered.  Timeout was called.  The patient was placed in the beach chair position with the head in neutral position.   The right shoulder had previously over the past 3 days has been scrubbed with benzoyl peroxide every morning.  Today, we used hydrogen peroxide, followed by alcohol and Betadine, which was allowed to air dry, followed by ChloraPrep solution.  Ioban used  to cover the operative field.  Sterile draping applied.  Timeout was called.  Head was in neutral position in the beach chair position.  At this time, after timeout was called, deltopectoral approach was made.  Skin and subcutaneous tissue were sharply  divided.   Cephalic vein mobilized medially.  Deltopectoral interval was developed.  There was significant nonpurulent effusion within the shoulder joint.  Significant tendinitis within the supraspinatus tendon.  Biceps tendon was tenodesed to the  pectoralis tendon.  Rotator interval was opened up to the base of the coracoid process.  Adhesions were removed manually in the subacromial and subdeltoid space.  Next, the circumflex vessels were ligated.  Axillary nerve was identified and a vessel loop  placed around it, protected at all times during the remaining portion of the case.  Next, the subscapularis was detached, along with the capsule around to the 5 o'clock position in the right shoulder.  This allowed the head to be dislocated.   Supraspinatus attachment was tenuous at best.  Subscap was robust.  Next, using intramedullary alignment, the humerus was reamed up to a size 10.  Then, the head was cut in 30 degrees of retroversion along the interface of the rotator cuff attachment to  the tuberosity.  The broaching was then performed up to a size 9.  A cap was placed and attention was directed towards the glenoid.  A Bankart lesion was created and the labrum was released circumferentially, with care being taken to avoid injury to the  axillary nerve.  This gave good exposure of the glenoid face.  Using patient-specific guide and model, optimal position of the guide pin was achieved.  At this time, reaming was performed in accordance with preoperative templating.  Baseplate was then  placed after sequential reaming was performed both for the baseplate as well as for the augment.  Excellent baseplate contact was achieved.  Excellent central compression screw fixation was also achieved.  Three peripheral locking screws were placed.   Next, the space was evaluated  between the humerus and the baseplate.  The +3 glenosphere was chosen with a 1.5 mm inferior offset.  That was placed in good position.  Attention was  directed towards the humerus.  A size 10 broach was utilized, which gave  a good fixation.  A true stem was placed with 4 tapes placed into the lesser tuberosity.  Next, a trial reduction was performed and the optimal soft tissue tension and stability was achieved with a standard thickness +6 taper offset, 40 mm diameter tray  with +3, 36 mm diameter thickness.  This gave excellent stability with adduction, forward flexion and extension, along with difficulty to achieve both reduction and dislocation.  Thorough irrigation was performed prior to implanting final components.   Final humeral tray components were implanted in good position with excellent stability achieved.  Next, thorough irrigation was performed and the subscapularis was then reapproximated to the lesser tuberosity with the arm in 30 degrees of external  rotation using the suture tapes.  Rotator interval closed using #1 Vicryl suture with the arm in 30 degrees of external rotation.  Vessel loop removed from the axillary nerve, which was palpated and intact.  Vancomycin powder was placed into the joint  prior to closure of the rotator interval, then after irrigation, vancomycin powder again placed in the layer just below the deltopectoral interval.  Deltopectoral interval was then closed using #1 Vicryl suture, followed by interrupted inverted 0 Vicryl  suture.  IrriSept solution, which was used after the arthrotomy and at multiple times during the case.  Vancomycin powder was then also placed just in the skin.  Skin was closed using interrupted inverted 2-0 Vicryl and a 3-0 Monocryl.  Steri-Strips  applied.  The patient then placed in a shoulder immobilizer.  He tolerated the procedure well without immediate complications.  Luke's assistance was required at all times for retraction, mobilization of tissues, opening and closing.  His assistance was  a medical necessity.  VN/NUANCE  D:03/03/2020 T:03/03/2020 JOB:[id number]

## 2020-03-03 NOTE — H&P (Signed)
Caitlin [name] is an 74 y.o. female.   Chief Complaint: Right shoulder pain HPI: Caitlin [name] is a 74 year old patient with right shoulder pain.  She has failed conservative management.  She has known arthritis.  She has some tendinopathy of the supraspinatus along with end-stage glenohumeral arthritis which is severe.  She has significant retroversion of the glenoid from arthritic deformity.  She reports night pain rest pain and pain with activities of daily living.  She presents now for operative management after explanation of risks and benefits.  Past Medical History:  Diagnosis Date  . Arthritis   . Asthma   . Breast cancer ([place])   . Breast disorder    cancer  . Breast mass    benign  . Cellulitis   . GERD (gastroesophageal reflux disease)   . Heart murmur    AS CHILD  OUTGREW IT  . History of hiatal hernia   . Hyperlipidemia   . Hypertension   . Impaired fasting glucose   . Rectocele   . Varicose vein of leg     Past Surgical History:  Procedure Laterality Date  . BREAST LUMPECTOMY WITH RADIOACTIVE SEED AND SENTINEL LYMPH NODE BIOPSY Right 11/11/2015   Procedure: RIGHT BREAST RADIOACTIVE SEED GUIDED  LUMPECTOMY AND RADIOACTIVE SEED TARGETED SENTINEL LYMPH NODE BIOPSY;  Surgeon: Stark Klein, MD;  Location: [place];  Service: General;  Laterality: Right;  . BREAST SURGERY Left    breast biopies x4  . CHOLECYSTECTOMY  09/08/2010  . COLONOSCOPY    . COLONOSCOPY N/A 09/25/2013   Procedure: COLONOSCOPY;  Surgeon: Rogene Houston, MD;  [facility];  Service: Endoscopy;  Laterality: N/A;  100-moved to 1200 Ann to notify pt  . HERNIA REPAIR  12/24/2010, 12-2014   TOTAL 4  . LAPAROSCOPIC GASTRIC SLEEVE RESECTION  11-2013   at [place]  2007/2008  . TONSILLECTOMY  1968  . TOTAL KNEE ARTHROPLASTY Left 05/02/2017  . TOTAL KNEE ARTHROPLASTY Left 05/02/2017   Procedure: LEFT TOTAL KNEE ARTHROPLASTY;  Surgeon: [name],  MD;  Location: [place];  Service: Orthopedics;  Laterality: Left;  . UPPER GASTROINTESTINAL ENDOSCOPY    . VAGINAL HYSTERECTOMY  1980s    Family History  Problem Relation Age of Onset  . Colon cancer Father   . Rectal cancer Father   . Prostate cancer Father   . Dementia Mother   . Osteoporosis Mother   . Healthy Daughter   . Healthy Son   . Pancreatic cancer Paternal Grandfather   . Heart disease Paternal Grandmother   . Heart disease Maternal Grandfather   . Breast cancer Maternal Grandmother   . Scoliosis Sister    Social History:  reports that she has never smoked. She has never used smokeless tobacco. She reports that she does not drink alcohol and does not use drugs.  Allergies:  Allergies  Allergen Reactions  . Horse-Derived Products     Reaction as a child "heart stopped"  . Sulfa Antibiotics Hives  . Sulfamethoxazole-Trimethoprim Rash  . Tetanus Toxoid Adsorbed Palpitations    As a child    Medications Prior to Admission  Medication Sig Dispense Refill  . anastrozole (ARIMIDEX) 1 MG tablet Take 1 tablet (1 mg total) by mouth daily. 90 tablet 4  . Calcium Citrate-Vitamin D (CALCIUM CITRATE CHEWY BITE) 500-500 MG-UNIT CHEW Chew 500 mg by mouth daily.     . cholecalciferol (VITAMIN D) 25 MCG (1000 UNIT) tablet  Take 1,000 Units by mouth daily at 2 PM.    . Cyanocobalamin (VITAMIN B-12) 2500 MCG SUBL Place 2,500 mcg under the tongue daily.    [name] docusate sodium (COLACE) 100 MG capsule Take 100 mg by mouth at bedtime.     [name] esomeprazole (NEXIUM) 20 MG capsule Take 40 mg by mouth daily before breakfast.     . famotidine (PEPCID) 20 MG tablet Take 20 mg by mouth daily after supper.     . Fluticasone-Umeclidin-Vilant (TRELEGY ELLIPTA) 200-62.5-25 MCG/INH AEPB Inhale 1 puff into the lungs daily. (Patient taking differently: Inhale 1 puff into the lungs daily as needed (respiratory issues). ) 2 each 0  . guaiFENesin (MUCINEX) 600 MG 12 hr tablet Take 600 mg by mouth at bedtime.      . Iron-Vitamin C (IRON 100/C PO) Take 1 tablet by mouth at bedtime. BariMelts Iron + Vitamin C    . losartan-hydrochlorothiazide (HYZAAR) 100-25 MG tablet Take 1 tablet by mouth daily.  1  . Magnesium 400 MG CAPS Take 400 mg by mouth every evening.     . Melatonin 10 MG TABS Take 10-20 mg by mouth at bedtime as needed (sleep).    . Menaquinone-7 (VITAMIN K2) 100 MCG CAPS Take 100 mcg by mouth daily at 2 PM.     . Multiple Vitamin (MULTIVITAMIN WITH MINERALS) TABS tablet Take 1 tablet by mouth in the morning and at bedtime. Celebrate Multivitamin    . pravastatin (PRAVACHOL) 40 MG tablet Take 40 mg by mouth at bedtime.    . Probiotic Product (PROBIOTIC DAILY PO) Take 1 capsule by mouth daily at 2 PM.     . Budeson-Glycopyrrol-Formoterol (BREZTRI AEROSPHERE) 160-9-4.8 MCG/ACT AERO Inhale 2 puffs into the lungs 2 (two) times daily. (Patient not taking: Reported on 02/19/2020) 5.9 g 6  . fluconazole (DIFLUCAN) 100 MG tablet Take 1 tablet (100 mg total) by mouth daily. (Patient not taking: Reported on 02/19/2020) 10 tablet 0  . predniSONE (DELTASONE) 10 MG tablet Take 2 tablets (20mg  total) daily for the next 5 days. Take in the AM with food. (Patient not taking: Reported on 02/19/2020) 10 tablet 0    No results found for this or any previous visit (from the past 48 hour(s)). No results found.  Review of Systems  Musculoskeletal: Positive for arthralgias.  All other systems reviewed and are negative.   Blood pressure (!) 143/65, pulse 100, resp. rate 16, height 5\' 3"  (1.6 m), weight 97.6 kg, SpO2 99 %. Physical Exam Vitals reviewed.  HENT:     Head: Normocephalic.     Nose: Nose normal.     Mouth/Throat:     Mouth: Mucous membranes are moist.  Eyes:     Pupils: Pupils are equal, round, and reactive to light.  Cardiovascular:     Rate and Rhythm: Normal rate.     Pulses: Normal pulses.  Pulmonary:     Effort: Pulmonary effort is normal.  Abdominal:     Palpations: Abdomen is soft.   Musculoskeletal:     Cervical back: Normal range of motion.  Skin:    General: Skin is warm.     Capillary Refill: Capillary refill takes less than 2 seconds.  Neurological:     General: No focal deficit present.     Mental Status: She is alert.  Psychiatric:        Mood and Affect: Mood normal.   Examination the right shoulder demonstrates intact deltoid.  Radial pulses intact.  Passive range  of motion is limited to below 90 degrees of forward flexion abduction.  Subscap infraspinatus strength is 5 out of 5.  Supraspinatus strength is slightly weaker.  External rotation of 15 degrees of abduction is 30 on the right compared to 60 on the left.  Skin is intact.  No open sores on the legs. Assessment/Plan Impression is end-stage right shoulder arthritis with supraspinatus tendinopathy and severe deformity of the glenoid.  She will need reverse shoulder replacement so that we can correct that deformity.  She has been templated for large augment.  Risk and benefits of the procedure discussed include not limited to infection nerve vessel damage dislocation incomplete restoration of function as well as potential need for revision.  Patient understands risk benefits.  All questions answered  Anderson Malta, MD 03/03/2020, 11:14 AM

## 2020-03-03 NOTE — Brief Op Note (Signed)
   03/03/2020  2:38 PM  PATIENT:  Caitlin [name]  74 y.o. female  PRE-OPERATIVE DIAGNOSIS:  right shoulder osteoarthritis  POST-OPERATIVE DIAGNOSIS:  right shoulder osteoarthritis  PROCEDURE:  Procedure(s): RIGHT REVERSE SHOULDER ARTHROPLASTY  SURGEON:  Surgeon(s): Meredith Pel, MD  ASSISTANT: magnant pa  ANESTHESIA:   general  EBL: 100 ml    Total I/O In: 1100 [I.V.:1000; IV Piggyback:100] Out: 100 [Blood:100]  BLOOD ADMINISTERED: none  DRAINS: none   LOCAL MEDICATIONS USED: vanco  SPECIMEN:  No Specimen  COUNTS:  YES  TOURNIQUET:  * No tourniquets in log *  DICTATION: .Other Dictation: Dictation Number [id number]  PLAN OF CARE: Admit for overnight observation  PATIENT DISPOSITION:  PACU - hemodynamically stable

## 2020-03-03 NOTE — Anesthesia Procedure Notes (Signed)
Procedure Name: Intubation Date/Time: 03/03/2020 11:38 AM Performed by: Colin Benton, CRNA Pre-anesthesia Checklist: Patient identified, Emergency Drugs available, Suction available and Patient being monitored Patient Re-evaluated:Patient Re-evaluated prior to induction Oxygen Delivery Method: Circle system utilized Preoxygenation: Pre-oxygenation with 100% oxygen Induction Type: IV induction Ventilation: Mask ventilation without difficulty Laryngoscope Size: Miller and 2 Grade View: Grade I Tube type: Oral Tube size: 7.0 mm Number of attempts: 1 Placement Confirmation: ETT inserted through vocal cords under direct vision,  positive ETCO2 and breath sounds checked- equal and bilateral Secured at: 22 cm Tube secured with: Tape Dental Injury: Teeth and Oropharynx as per pre-operative assessment

## 2020-03-03 NOTE — Transfer of Care (Signed)
Immediate Anesthesia Transfer of Care Note  Patient: Caitlin [name]  Procedure(s) Performed: RIGHT REVERSE SHOULDER ARTHROPLASTY (Right Shoulder)  Patient Location: PACU  Anesthesia Type:General and Regional  Level of Consciousness: awake, alert  and oriented  Airway & Oxygen Therapy: Patient Spontanous Breathing and Patient connected to nasal cannula oxygen  Post-op Assessment: Report given to RN and Post -op Vital signs reviewed and stable  Post vital signs: Reviewed and stable  Last Vitals:  Vitals Value Taken Time  BP 134/72 03/03/20 1456  Temp 36.7 C 03/03/20 1456  Pulse 92 03/03/20 1501  Resp 18 03/03/20 1501  SpO2 95 % 03/03/20 1501  Vitals shown include unvalidated device data.  Last Pain:  Vitals:   03/03/20 1456  TempSrc:   PainSc: 0-No pain      Patients Stated Pain Goal: 3 (82/64/15 8309)  Complications: No complications documented.

## 2020-03-03 NOTE — Anesthesia Procedure Notes (Signed)
Anesthesia Regional Block: Interscalene brachial plexus block   Pre-Anesthetic Checklist: ,, timeout performed, Correct Patient, Correct Site, Correct Laterality, Correct Procedure, Correct Position, site marked, Risks and benefits discussed,  Surgical consent,  Pre-op evaluation,  At surgeon's request and post-op pain management  Laterality: Right  Prep: chloraprep       Needles:  Injection technique: Single-shot  Needle Type: Stimulator Needle - 40      Needle Gauge: 22     Additional Needles:   Procedures:, nerve stimulator,,,,,,,  Narrative:  Start time: 03/03/2020 10:30 AM End time: 03/03/2020 10:40 AM Injection made incrementally with aspirations every 5 mL.  Performed by: Personally  Anesthesiologist: Roberts Gaudy, MD  Additional Notes: 25 cc 0.5% Marcaine 1:200 epi 10 cc 1.3% Exparel

## 2020-03-03 NOTE — Anesthesia Preprocedure Evaluation (Addendum)
Anesthesia Evaluation  Patient identified by MRN, date of birth, ID band Patient awake    Reviewed: Allergy & Precautions, NPO status , Patient's Chart, lab work & pertinent test results  Airway Mallampati: II  TM Distance: >3 FB Neck ROM: Full    Dental  (+) Teeth Intact, Dental Advisory Given   Pulmonary asthma ,    breath sounds clear to auscultation       Cardiovascular hypertension, Pt. on medications  Rhythm:Regular Rate:Normal     Neuro/Psych negative neurological ROS  negative psych ROS   GI/Hepatic Neg liver ROS, hiatal hernia, GERD  Medicated and Controlled,  Endo/Other  BMI 38  Renal/GU negative Renal ROS  negative genitourinary   Musculoskeletal  (+) Arthritis , Osteoarthritis,    Abdominal   Peds negative pediatric ROS (+)  Hematology negative hematology ROS (+)   Anesthesia Other Findings   Reproductive/Obstetrics negative OB ROS                            Anesthesia Physical Anesthesia Plan  ASA: II  Anesthesia Plan: General and Regional   Post-op Pain Management:  Regional for Post-op pain   Induction: Intravenous  PONV Risk Score and Plan: 3 and Ondansetron, Dexamethasone and Treatment may vary due to age or medical condition  Airway Management Planned: Oral ETT  Additional Equipment:   Intra-op Plan:   Post-operative Plan: Extubation in OR  Informed Consent: I have reviewed the patients History and Physical, chart, labs and discussed the procedure including the risks, benefits and alternatives for the proposed anesthesia with the patient or authorized representative who has indicated his/her understanding and acceptance.     Dental advisory given  Plan Discussed with: CRNA and Anesthesiologist  Anesthesia Plan Comments:       Anesthesia Quick Evaluation

## 2020-03-04 DIAGNOSIS — I10 Essential (primary) hypertension: Secondary | ICD-10-CM | POA: Diagnosis not present

## 2020-03-04 DIAGNOSIS — Z853 Personal history of malignant neoplasm of breast: Secondary | ICD-10-CM | POA: Diagnosis not present

## 2020-03-04 DIAGNOSIS — Z96652 Presence of left artificial knee joint: Secondary | ICD-10-CM | POA: Diagnosis not present

## 2020-03-04 DIAGNOSIS — Z79899 Other long term (current) drug therapy: Secondary | ICD-10-CM | POA: Diagnosis not present

## 2020-03-04 DIAGNOSIS — M19011 Primary osteoarthritis, right shoulder: Secondary | ICD-10-CM | POA: Diagnosis not present

## 2020-03-04 DIAGNOSIS — J45909 Unspecified asthma, uncomplicated: Secondary | ICD-10-CM | POA: Diagnosis not present

## 2020-03-04 LAB — GLUCOSE, CAPILLARY
Glucose-Capillary: 100 mg/dL — ABNORMAL HIGH (ref 70–99)
Glucose-Capillary: 116 mg/dL — ABNORMAL HIGH (ref 70–99)

## 2020-03-04 MED ORDER — METHOCARBAMOL 500 MG PO TABS: 500.0000 mg | ORAL_TABLET | Freq: Three times a day (TID) | ORAL | 0 refills | Status: DC | PRN

## 2020-03-04 MED ORDER — OXYCODONE HCL 5 MG PO TABS: 5.0000 mg | ORAL_TABLET | ORAL | 0 refills | Status: DC | PRN

## 2020-03-04 MED ORDER — ASPIRIN 81 MG PO TBEC: 81.0000 mg | DELAYED_RELEASE_TABLET | Freq: Every day | ORAL | 0 refills | Status: DC

## 2020-03-04 NOTE — Evaluation (Signed)
Occupational Therapy Evaluation Patient Details Name: Caitlin [name] MRN: [id number] DOB: 1946/07/01 Today's Date: 03/04/2020    History of Present Illness Caitlin [name]. Caitlin [name] is a 73y.o. female s/p R reverse shoulder arthroplasty. PMH includes breast cancer, total L knee arthroplasty 2018, GERD, HTN, hyperlipidemia, asthma, and arthritis.    Clinical Impression   PTA, pt was living at home with her husband, pt was independent with ADL/IADL and independent with functional mobility. Pt reports 2 falls, 1 last week and the other in May of 2021. Pt demonstrates increased fall risk due to minor instability, shuffling feet during ambulation and previous falls. Educated pt and husband on fall prevention strategies. Pt currently demonstrates limited functional use of RUE as she is s/p arthroplasty. Pt requires modA for UB dressing, minguard-minA for functional mobility without AD. Educated pt on fall prevention, NWB precautions, RUE HEP, and positioning of RUE in sling/during sleep/during showers. Husband present and actively engaged throughout session, demonstrates ability to safely and appropriately assist pt. Due to decline in current level of function, pt would benefit from acute OT to address established goals to facilitate safe D/C to venue listed below. At this time, recommend follow surgeons recommendation for DC plan. Will continue to follow acutely.     Follow Up Recommendations  Follow surgeon's recommendation for DC plan and follow-up therapies    Equipment Recommendations  None recommended by OT    Recommendations for Other Services PT consult     Precautions / Restrictions Precautions Precautions: Shoulder Type of Shoulder Precautions: No AROM;Pendulums okay;AROM elbow, wrist, hand to tolerance;NWB RUE Shoulder Interventions: Shoulder sling/immobilizer;At all times;Off for dressing/bathing/exercises Precaution Booklet Issued: Yes (comment) Precaution Comments: verbally reviewed and  demonstrated Required Braces or Orthoses: Sling Restrictions Weight Bearing Restrictions: Yes RUE Weight Bearing: Non weight bearing      Mobility Bed Mobility               General bed mobility comments: pt sitting EOB upon arrival, discussed bed mobility to get out on left side, pt has sleep number bed at home  Transfers Overall transfer level: Needs assistance Equipment used: None Transfers: Sit to/from Stand Sit to Stand: Min assist;Min guard         General transfer comment: minA for sit<>stand from lower surface, minguard to stand from EOB    Balance Overall balance assessment: Needs assistance Sitting-balance support: No upper extremity supported;Feet supported Sitting balance-Leahy Scale: Fair     Standing balance support: No upper extremity supported;During functional activity Standing balance-Leahy Scale: Fair Standing balance comment: pt shuffling feeting during ambulation, mild instability noted, no LOB, pt ambulating without AD, discussed use of cane for increased stability                           ADL either performed or assessed with clinical judgement   ADL Overall ADL's : Needs assistance/impaired Eating/Feeding: Set up;Sitting   Grooming: Min guard;Standing   Upper Body Bathing: Minimal assistance;Sitting   Lower Body Bathing: Minimal assistance;Sit to/from stand   Upper Body Dressing : Moderate assistance;Sitting Upper Body Dressing Details (indicate cue type and reason): modA for donning/doffing sling, educated pt and husband on proper sling positioning, wear schedule, and compensatory dressing strategies Lower Body Dressing: Minimal assistance;Sit to/from stand   Toilet Transfer: Minimal assistance;Ambulation Toilet Transfer Details (indicate cue type and reason): minA to sit on standard commode, minguard for ambulation Toileting- Clothing Manipulation and Hygiene: Minimal assistance;Sit to/from stand [place]  Manipulation Details (indicate cue type and reason): for controlled sit<>stand on standard height commode     Functional mobility during ADLs: Minimal assistance;Min guard General ADL Comments: minA at times with challenge to balance and to transfer to/from lower height surfaces;minguard for ambulation in room, minor instability noted     Vision Baseline Vision/History: Wears glasses Wears Glasses: Reading only Patient Visual Report: No change from baseline       Perception     Praxis      Pertinent Vitals/Pain Pain Assessment: 0-10 Pain Score: 3  Pain Location: R shoulder Pain Descriptors / Indicators: Sore;Constant;Discomfort Pain Intervention(s): Limited activity within patient's tolerance;Monitored during session;Repositioned;Ice applied     Hand Dominance Right   Extremity/Trunk Assessment Upper Extremity Assessment Upper Extremity Assessment: RUE deficits/detail RUE Deficits / Details: consistent with shoulder arthroplasty;reports tingling in thumb, able to complete full elbow extension, limited elbow flexion;full wrist AROM;WNL digit composition/extension;fine motor coordination WFL RUE: Unable to fully assess due to pain;Unable to fully assess due to immobilization RUE Sensation: decreased light touch RUE Coordination: decreased gross motor   Lower Extremity Assessment Lower Extremity Assessment: Defer to PT evaluation   Cervical / Trunk Assessment Cervical / Trunk Assessment: Normal   Communication Communication Communication: No difficulties   Cognition Arousal/Alertness: Awake/alert Behavior During Therapy: WFL for tasks assessed/performed Overall Cognitive Status: Within Functional Limits for tasks assessed                                 General Comments: good awareness of precautions   General Comments  vss    Exercises Exercises: Shoulder Shoulder Exercises Pendulum Exercise: Right;5 reps;Seated Elbow Flexion: AROM;Right;10  reps;Seated Elbow Extension: AROM;Right;10 reps;Seated Wrist Flexion: AROM;Right;10 reps;Seated Wrist Extension: AROM;Right;10 reps;Seated Digit Composite Flexion: AROM;Right;10 reps;Seated Composite Extension: AROM;Right;10 reps;Seated   Shoulder Instructions Shoulder Instructions Donning/doffing shirt without moving shoulder: Patient able to independently direct caregiver;Caregiver independent with task Method for sponge bathing under operated UE: Min-guard Donning/doffing sling/immobilizer: Caregiver independent with task;Patient able to independently direct caregiver Correct positioning of sling/immobilizer: Caregiver independent with task;Patient able to independently direct caregiver Pendulum exercises (written home exercise program): Minimal assistance ROM for elbow, wrist and digits of operated UE: Modified independent Sling wearing schedule (on at all times/off for ADL's): Modified independent Proper positioning of operated UE when showering: Caregiver independent with task;Patient able to independently direct caregiver Positioning of UE while sleeping: Caregiver independent with task;Patient able to independently direct caregiver    Home Living Family/patient expects to be discharged to:: Private residence Living Arrangements: Spouse/significant other Available Help at Discharge: Family;Available 24 hours/day Type of Home: House Home Access: Stairs to enter [employer] of Steps: 1 Entrance Stairs-Rails: None Home Layout: Two level;Able to live on main level with bedroom/bathroom     Bathroom Shower/Tub: Occupational psychologist: Handicapped height     Home Equipment: Cane - single point;Grab bars - toilet;Grab bars - tub/shower;Shower seat          Prior Functioning/Environment Level of Independence: Independent        Comments: independent with all ADL/IADL         OT Problem List: Decreased activity tolerance;Decreased range of  motion;Impaired balance (sitting and/or standing);Decreased safety awareness;Decreased knowledge of precautions;Impaired UE functional use;Pain      OT Treatment/Interventions:      OT Goals(Current goals can be found in the care plan section) Acute Rehab OT Goals Patient Stated Goal: to  regain full use of RUE OT Goal Formulation: With patient Time For Goal Achievement: 03/18/20 Potential to Achieve Goals: Good ADL Goals Pt Will Perform Upper Body Dressing: with supervision;sitting Pt/caregiver will Perform Home Exercise Program: Right Upper extremity;With written HEP provided;Independently Additional ADL Goal #1: Pt will demonstrate use of 3 fall prevention strategies during ADL/IADL and functional mobility.  OT Frequency:     Barriers to D/C:            Co-evaluation              AM-PAC OT "6 Clicks" Daily Activity     Outcome Measure Help from another person eating meals?: None Help from another person taking care of personal grooming?: A Little Help from another person toileting, which includes using toliet, bedpan, or urinal?: A Little Help from another person bathing (including washing, rinsing, drying)?: A Little Help from another person to put on and taking off regular upper body clothing?: A Lot Help from another person to put on and taking off regular lower body clothing?: A Little 6 Click Score: 18   End of Session Equipment Utilized During Treatment: Other (comment) (sling) Nurse Communication: Mobility status  Activity Tolerance: Patient tolerated treatment well Patient left: in chair;with call bell/phone within reach;with family/visitor present  OT Visit Diagnosis: Other abnormalities of gait and mobility (R26.89);History of falling (Z91.81);Pain Pain - Right/Left: Right Pain - part of body: Shoulder                Time: 7579-7282 OT Time Calculation (min): 35 min Charges:  OT General Charges $OT Visit: 1 Visit OT Evaluation $OT Eval Moderate  Complexity: 1 Mod OT Treatments $Self Care/Home Management : 8-22 mins  Helene Kelp OTR/L Acute Rehabilitation Services Office: [phone]   Wyn Forster 03/04/2020, 11:07 AM

## 2020-03-04 NOTE — Progress Notes (Signed)
Provided discharge education/instructions, all questions and concerns addressed, Pt not in distress, discharged home with belongings accompanied by husband. 

## 2020-03-04 NOTE — Care Management Obs Status (Signed)
[place] NOTIFICATION   Patient Details  Name: Caitlin [name] MRN: [id number] Date of Birth: 03/03/1946   Medicare Observation Status Notification Given:  Yes    Curlene Labrum, RN 03/04/2020, 11:42 AM

## 2020-03-04 NOTE — Evaluation (Signed)
Physical Therapy Evaluation & Discharge  Patient Details Name: Caitlin [name] MRN: [id number] DOB: 1946/05/01 Today's Date: 03/04/2020   History of Present Illness  Caitlin [name] is a 74 y.o. female s/p R reverse shoulder arthroplasty on 03/03/20. PMH includes breast cancer, L TKA 2018, GERD, HTN, hyperlipidemia, asthma, arthritis.    Clinical Impression  Pt presents with an overall decrease in functional mobility secondary to above. PTA, pt independent and lives with supportive husband. Today, pt able to perform transfer and gait training with SPC at supervision-level; pt demonstrates improved stability with LUE support. Pt preparing for d/c home today. No further acute PT needs identified. Will sign off.    Follow Up Recommendations Follow surgeon's recommendation for DC plan and follow-up therapies;Home health PT    Equipment Recommendations  Cane    Recommendations for Other Services       Precautions / Restrictions Precautions Precautions: Shoulder Type of Shoulder Precautions: No AROM;Pendulums okay;AROM elbow, wrist, hand to tolerance;NWB RUE Shoulder Interventions: Shoulder sling/immobilizer;At all times;Off for dressing/bathing/exercises Precaution Booklet Issued: Yes (comment) Precaution Comments: verbally reviewed and demonstrated Required Braces or Orthoses: Sling Restrictions Weight Bearing Restrictions: Yes RUE Weight Bearing: Non weight bearing      Mobility  Bed Mobility               General bed mobility comments: Received sitting in recliner  Transfers Overall transfer level: Needs assistance Equipment used: None;Straight cane Transfers: Sit to/from Stand Sit to Stand: Supervision         General transfer comment: Cues for sequencing with SPC; supervision for safety  Ambulation/Gait Ambulation/Gait assistance: Supervision Gait Distance (Feet): 120 Feet Assistive device: Straight cane;None Gait Pattern/deviations: Step-through  pattern;Decreased stride length Gait velocity: Decreased   General Gait Details: pt moving well with and without SPC; cues for sequencing with SPC and pt reports stability improved. Walking in room without DME, more guarded without DME, supervision-level  Stairs Stairs:  (pt declined stair training; recommend HHA from husband since no railing on 1 step into home)          Wheelchair Mobility    Modified Rankin (Stroke Patients Only)       Balance Overall balance assessment: Needs assistance Sitting-balance support: No upper extremity supported;Feet supported Sitting balance-Leahy Scale: Fair     Standing balance support: No upper extremity supported;During functional activity Standing balance-Leahy Scale: Fair Standing balance comment: Ambulatory without UE support, stability improved with SPC                             Pertinent Vitals/Pain Pain Assessment: Faces Pain Score: 3  Faces Pain Scale: Hurts a little bit Pain Location: R shoulder Pain Descriptors / Indicators: Sore;Constant;Discomfort Pain Intervention(s): Monitored during session    Home Living Family/patient expects to be discharged to:: Private residence Living Arrangements: Spouse/significant other Available Help at Discharge: Family;Available 24 hours/day Type of Home: House Home Access: Stairs to enter Entrance Stairs-Rails: None Entrance Stairs-Number of Steps: 1 Home Layout: Two level;Able to live on main level with bedroom/bathroom Home Equipment: Grab bars - toilet;Grab bars - tub/shower;Shower seat      Prior Function Level of Independence: Independent         Comments: independent with all ADL/IADL      Hand Dominance   Dominant Hand: Right    Extremity/Trunk Assessment   Upper Extremity Assessment Upper Extremity Assessment: RUE deficits/detail;Defer to OT evaluation RUE Deficits / Details: consistent with  shoulder arthroplasty;reports tingling in thumb, able to  complete full elbow extension, limited elbow flexion;full wrist AROM;WNL digit composition/extension;fine motor coordination WFL RUE: Unable to fully assess due to pain;Unable to fully assess due to immobilization RUE Sensation: decreased light touch RUE Coordination: decreased gross motor    Lower Extremity Assessment Lower Extremity Assessment: Overall WFL for tasks assessed    Cervical / Trunk Assessment Cervical / Trunk Assessment: Normal  Communication   Communication: No difficulties  Cognition Arousal/Alertness: Awake/alert Behavior During Therapy: WFL for tasks assessed/performed Overall Cognitive Status: Within Functional Limits for tasks assessed                                 General Comments: good awareness of precautions      General Comments General comments (skin integrity, edema, etc.): Husband present and supportive    Exercises Shoulder Exercises Pendulum Exercise: Right;5 reps;Seated Elbow Flexion: AROM;Right;10 reps;Seated Elbow Extension: AROM;Right;10 reps;Seated Wrist Flexion: AROM;Right;10 reps;Seated Wrist Extension: AROM;Right;10 reps;Seated Digit Composite Flexion: AROM;Right;10 reps;Seated Composite Extension: AROM;Right;10 reps;Seated Donning/doffing shirt without moving shoulder: Patient able to independently direct caregiver;Caregiver independent with task Method for sponge bathing under operated UE: Min-guard Donning/doffing sling/immobilizer: Caregiver independent with task;Patient able to independently direct caregiver Correct positioning of sling/immobilizer: Caregiver independent with task;Patient able to independently direct caregiver Pendulum exercises (written home exercise program): Minimal assistance ROM for elbow, wrist and digits of operated UE: Modified independent Sling wearing schedule (on at all times/off for ADL's): Modified independent Proper positioning of operated UE when showering: Caregiver independent with  task;Patient able to independently direct caregiver Positioning of UE while sleeping: Caregiver independent with task;Patient able to independently direct caregiver   Assessment/Plan    PT Assessment All further PT needs can be met in the next venue of care  PT Problem List Decreased strength;Decreased range of motion;Decreased activity tolerance;Decreased balance;Decreased mobility;Decreased knowledge of use of DME;Decreased knowledge of precautions;Pain       PT Treatment Interventions      PT Goals (Current goals can be found in the Care Plan section)  Acute Rehab PT Goals Patient Stated Goal: to regain full use of RUE PT Goal Formulation: All assessment and education complete, DC therapy    Frequency     Barriers to discharge        Co-evaluation               AM-PAC PT "6 Clicks" Mobility  Outcome Measure Help needed turning from your back to your side while in a flat bed without using bedrails?: A Little Help needed moving from lying on your back to sitting on the side of a flat bed without using bedrails?: A Little Help needed moving to and from a bed to a chair (including a wheelchair)?: None Help needed standing up from a chair using your arms (e.g., wheelchair or bedside chair)?: None Help needed to walk in hospital room?: None Help needed climbing 3-5 steps with a railing? : A Little 6 Click Score: 21    End of Session   Activity Tolerance: Patient tolerated treatment well Patient left: with call bell/phone within reach;with family/visitor present Nurse Communication: Mobility status PT Visit Diagnosis: Other abnormalities of gait and mobility (R26.89);Pain Pain - Right/Left: Right Pain - part of body: Shoulder    Time: 8299-3716 PT Time Calculation (min) (ACUTE ONLY): 10 min   Charges:   PT Evaluation $PT Eval Low Complexity: 1 Low  Mabeline Caras, PT, DPT Acute Rehabilitation Services  Pager [phone] Office [place] 03/04/2020, 1:14 PM

## 2020-03-04 NOTE — TOC Initial Note (Addendum)
Transition of Care [facility]) - Initial/Assessment Note    Patient Details  Name: Caitlin [name] MRN: [id number] Date of Birth: Aug 29, 1945  Transition of Care [facility]) CM/SW Contact:    Curlene Labrum, RN Phone Number: 03/04/2020, 12:00 PM  Clinical Narrative:                 Case management met with the patient and husband at the bedside S/P Right shoulder repair by Dr. Marlou Sa.  Patient was previously set [facility] with [facility] for PT and [employer] already delivered CPM to the home for her shoulder.  Patient was given the [facility] form and will be discharging home today with her husband.  The patient has received both Moderna vaccines through [employer] in [place].  Spoke with PT and OT was added to the patient's orders along with a 1-point cane.  [employer] was called and they will be delivering it to the patient's room.  Expected Discharge Plan: [place] Barriers to Discharge: No Barriers Identified   Patient Goals and CMS Choice Patient states their goals for this hospitalization and ongoing recovery are:: Patient is planning to discharge home with home health [facility] CMS Medicare.gov Compare Post Acute Care list provided to:: Patient Choice offered to / list presented to : Patient  Expected Discharge Plan and Services Expected Discharge Plan: [place]   Discharge Planning Services: CM Consult Post Acute Care Choice: [place] arrangements for the past 2 months: Single Family Home Expected Discharge Date: 03/04/20               DME Arranged:  (CPM set up for shoulder thru Dr. Randel Pigg office) DME Agency: [employer]     Representative spoke with at DME Agency: DME called and patient states that has already been delivered to the home. HH Arranged: PT [place] Agency: [facility] [employer] [facility] [employer] Date [place]: 03/04/20 Time [place]: 1200 Representative spoke with at [place]:  [facility] [place] Arrangements/Services Living arrangements for the past 2 months: [place] Lives with:: Spouse Patient language and need for interpreter reviewed:: Yes Do you feel safe going back to the place where you live?: Yes      Need for Family Participation in Patient Care: Yes (Comment) Care giver support system in place?: Yes (comment) Current home services: Home PT Criminal Activity/Legal Involvement Pertinent to Current Situation/Hospitalization: No - Comment as needed  Activities of Daily Living Home Assistive Devices/Equipment: Blood pressure cuff, Eyeglasses, Grab bars in shower, Grab bars around toilet, Built-in shower seat ADL Screening (condition at time of admission) Patient's cognitive ability adequate to safely complete daily activities?: Yes Is the patient deaf or have difficulty hearing?: No Does the patient have difficulty seeing, even when wearing glasses/contacts?: No Does the patient have difficulty concentrating, remembering, or making decisions?: No Patient able to express need for assistance with ADLs?: Yes Does the patient have difficulty dressing or bathing?: No Independently performs ADLs?: Yes (appropriate for developmental age) Does the patient have difficulty walking or climbing stairs?: No Weakness of Legs: None Weakness of Arms/Hands: Right  Permission Sought/Granted Permission sought to share information with : Case Manager Permission granted to share information with : Yes, Verbal Permission Granted     Permission granted to share info w AGENCY: [facility] [place] [facility] granted to share info w Relationship: spouse     Emotional Assessment Appearance:: Appears  stated age Attitude/Demeanor/Rapport: Gracious Affect (typically observed): Accepting Orientation: : Oriented to Self, Oriented to Place, Oriented to  Time, Oriented to Situation Alcohol / Substance Use: Not Applicable Psych Involvement: No  (comment)  Admission diagnosis:  S/P reverse total shoulder arthroplasty, right [Z96.611] Patient Active Problem List   Diagnosis Date Noted  . S/P reverse total shoulder arthroplasty, right 03/03/2020  . Primary osteoarthritis, right shoulder 12/04/2019  . Chronic right shoulder pain 11/29/2018  . Localized swelling of left lower extremity 07/19/2018  . Cellulitis and abscess of left leg 06/15/2018  . Unilateral primary osteoarthritis, left knee 05/02/2017  . S/P total knee replacement using cement, left 05/02/2017  . Primary osteoarthritis of left knee 12/21/2016  . Chondromalacia patellae, left knee 05/26/2016  . Breast cancer of upper-inner quadrant of right female breast ([place]) 11/04/2015  . GERD (gastroesophageal reflux disease) 09/02/2013  . Obesity 09/02/2013  . Ventral hernia, recurrent 09/02/2013  . Unspecified asthma(493.90) 12/12/2012  . Cough variant asthma 03/27/2012  . Right middle lobe syndrome 03/27/2012   PCP:  Sharilyn Sites, MD Pharmacy:   [id number] [place] [email or web address] [id number]: [phone]: [phone]    Social Determinants of Health (SDOH) Interventions    Readmission Risk Interventions No flowsheet data found.

## 2020-03-04 NOTE — Plan of Care (Signed)

## 2020-03-06 ENCOUNTER — Encounter (HOSPITAL_COMMUNITY): Payer: Self-pay | Admitting: Orthopedic Surgery

## 2020-03-07 DIAGNOSIS — Z7951 Long term (current) use of inhaled steroids: Secondary | ICD-10-CM | POA: Diagnosis not present

## 2020-03-07 DIAGNOSIS — K219 Gastro-esophageal reflux disease without esophagitis: Secondary | ICD-10-CM | POA: Diagnosis not present

## 2020-03-07 DIAGNOSIS — K5900 Constipation, unspecified: Secondary | ICD-10-CM | POA: Diagnosis not present

## 2020-03-07 DIAGNOSIS — I10 Essential (primary) hypertension: Secondary | ICD-10-CM | POA: Diagnosis not present

## 2020-03-07 DIAGNOSIS — Z96611 Presence of right artificial shoulder joint: Secondary | ICD-10-CM | POA: Diagnosis not present

## 2020-03-07 DIAGNOSIS — E785 Hyperlipidemia, unspecified: Secondary | ICD-10-CM | POA: Diagnosis not present

## 2020-03-07 DIAGNOSIS — E669 Obesity, unspecified: Secondary | ICD-10-CM | POA: Diagnosis not present

## 2020-03-07 DIAGNOSIS — Z471 Aftercare following joint replacement surgery: Secondary | ICD-10-CM | POA: Diagnosis not present

## 2020-03-07 DIAGNOSIS — Z853 Personal history of malignant neoplasm of breast: Secondary | ICD-10-CM | POA: Diagnosis not present

## 2020-03-07 DIAGNOSIS — N816 Rectocele: Secondary | ICD-10-CM | POA: Diagnosis not present

## 2020-03-07 DIAGNOSIS — J45991 Cough variant asthma: Secondary | ICD-10-CM | POA: Diagnosis not present

## 2020-03-07 DIAGNOSIS — I8390 Asymptomatic varicose veins of unspecified lower extremity: Secondary | ICD-10-CM | POA: Diagnosis not present

## 2020-03-07 DIAGNOSIS — Z7982 Long term (current) use of aspirin: Secondary | ICD-10-CM | POA: Diagnosis not present

## 2020-03-07 DIAGNOSIS — Z96652 Presence of left artificial knee joint: Secondary | ICD-10-CM | POA: Diagnosis not present

## 2020-03-07 DIAGNOSIS — Z6838 Body mass index (BMI) 38.0-38.9, adult: Secondary | ICD-10-CM | POA: Diagnosis not present

## 2020-03-09 ENCOUNTER — Other Ambulatory Visit: Payer: Self-pay | Admitting: Orthopedic Surgery

## 2020-03-09 ENCOUNTER — Telehealth: Payer: Self-pay | Admitting: Orthopedic Surgery

## 2020-03-09 MED ORDER — OXYCODONE HCL 5 MG PO CAPS: 5.0000 mg | ORAL_CAPSULE | ORAL | 0 refills | Status: DC | PRN

## 2020-03-09 NOTE — Telephone Encounter (Signed)
Med sent thx

## 2020-03-09 NOTE — Telephone Encounter (Signed)
FYI  I called patient and advised about pain medication. Occupational therapist is coming out tomorrow morning at [place].

## 2020-03-09 NOTE — Telephone Encounter (Signed)
Caitlin [name] is s/p right reverse shoulder on Tuesday 03/03/20.  She is calling for 2 reasons. 1) Needs refill of oxycodone 5mg  [facility] [address] in [place]. 2) She was expecting a call from the occupational therapist with [facility]. She has not heard from anyone. Can we check on this for her? You can reach her at the home # [phone].

## 2020-03-09 NOTE — Telephone Encounter (Signed)
See below about rxrf. I have sent message to Sonia Side about [place] referral.

## 2020-03-10 ENCOUNTER — Telehealth: Payer: Self-pay

## 2020-03-10 DIAGNOSIS — N816 Rectocele: Secondary | ICD-10-CM | POA: Diagnosis not present

## 2020-03-10 DIAGNOSIS — J45991 Cough variant asthma: Secondary | ICD-10-CM | POA: Diagnosis not present

## 2020-03-10 DIAGNOSIS — I10 Essential (primary) hypertension: Secondary | ICD-10-CM | POA: Diagnosis not present

## 2020-03-10 DIAGNOSIS — Z471 Aftercare following joint replacement surgery: Secondary | ICD-10-CM | POA: Diagnosis not present

## 2020-03-10 DIAGNOSIS — E669 Obesity, unspecified: Secondary | ICD-10-CM | POA: Diagnosis not present

## 2020-03-10 DIAGNOSIS — K5900 Constipation, unspecified: Secondary | ICD-10-CM | POA: Diagnosis not present

## 2020-03-10 NOTE — Telephone Encounter (Signed)
Passive range of motion to pain tolerance.  No external rotation more than 30 degrees.  No lifting with left arm until return office visit.  Thanks

## 2020-03-10 NOTE — Telephone Encounter (Signed)
Sonia Side with [facility] would like to know what kind of restrictions patient has for HHPT for the shoulder?

## 2020-03-11 NOTE — Telephone Encounter (Signed)
Jerry notified.

## 2020-03-16 ENCOUNTER — Telehealth: Payer: Self-pay

## 2020-03-16 DIAGNOSIS — I10 Essential (primary) hypertension: Secondary | ICD-10-CM | POA: Diagnosis not present

## 2020-03-16 DIAGNOSIS — Z471 Aftercare following joint replacement surgery: Secondary | ICD-10-CM | POA: Diagnosis not present

## 2020-03-16 DIAGNOSIS — K5900 Constipation, unspecified: Secondary | ICD-10-CM | POA: Diagnosis not present

## 2020-03-16 DIAGNOSIS — J45991 Cough variant asthma: Secondary | ICD-10-CM | POA: Diagnosis not present

## 2020-03-16 DIAGNOSIS — N816 Rectocele: Secondary | ICD-10-CM | POA: Diagnosis not present

## 2020-03-16 DIAGNOSIS — E669 Obesity, unspecified: Secondary | ICD-10-CM | POA: Diagnosis not present

## 2020-03-16 NOTE — Telephone Encounter (Signed)
[place] for 110 thx

## 2020-03-16 NOTE — Telephone Encounter (Signed)
Patient wanted to let Dr. Marlou Sa know that she is at 90 degrees on the CPM.  Would like to know if Dr. Marlou Sa wanted her to go higher?  Patient had right shoulder surgery on 03/03/2020.  Cb# [id number].  Please advise.  Thank you.

## 2020-03-16 NOTE — Telephone Encounter (Signed)
IC s/w patient and advised. Verbalized understanding.  

## 2020-03-16 NOTE — Telephone Encounter (Signed)
Please advise. Thanks.  

## 2020-03-18 ENCOUNTER — Ambulatory Visit (INDEPENDENT_AMBULATORY_CARE_PROVIDER_SITE_OTHER): Payer: Medicare Other

## 2020-03-18 ENCOUNTER — Telehealth: Payer: Self-pay | Admitting: Orthopedic Surgery

## 2020-03-18 ENCOUNTER — Ambulatory Visit (INDEPENDENT_AMBULATORY_CARE_PROVIDER_SITE_OTHER): Payer: Medicare Other | Admitting: Orthopedic Surgery

## 2020-03-18 DIAGNOSIS — N816 Rectocele: Secondary | ICD-10-CM | POA: Diagnosis not present

## 2020-03-18 DIAGNOSIS — K5900 Constipation, unspecified: Secondary | ICD-10-CM | POA: Diagnosis not present

## 2020-03-18 DIAGNOSIS — Z96611 Presence of right artificial shoulder joint: Secondary | ICD-10-CM

## 2020-03-18 DIAGNOSIS — I10 Essential (primary) hypertension: Secondary | ICD-10-CM | POA: Diagnosis not present

## 2020-03-18 DIAGNOSIS — J45991 Cough variant asthma: Secondary | ICD-10-CM | POA: Diagnosis not present

## 2020-03-18 DIAGNOSIS — E669 Obesity, unspecified: Secondary | ICD-10-CM | POA: Diagnosis not present

## 2020-03-18 DIAGNOSIS — Z471 Aftercare following joint replacement surgery: Secondary | ICD-10-CM | POA: Diagnosis not present

## 2020-03-18 NOTE — Telephone Encounter (Signed)
OT Mallory form [email or web address]  called requesting a call back. Mallory wants to know if any new instruction for OT for patient. Example range of motion, motion tolerance ect. Please call Mallory back at [phone]. If unable to answer leave a detailed message on secure VM.

## 2020-03-18 NOTE — Telephone Encounter (Signed)
Please advise. Thanks.  

## 2020-03-19 NOTE — Discharge Summary (Signed)
Physician Discharge Summary      Patient ID: Caitlin [name] MRN: [id number] DOB/AGE: October 29, 1945 74 y.o.  Admit date: 03/03/2020 Discharge date: 03/04/2020  Admission Diagnoses:  Active Problems:   S/P reverse total shoulder arthroplasty, right   Discharge Diagnoses:  Same  Surgeries: Procedure(s): RIGHT REVERSE SHOULDER ARTHROPLASTY on 03/03/2020   Consultants:   Discharged Condition: Stable  Hospital Course: Caitlin [name] is an 75 y.o. female who was admitted 03/03/2020 with a chief complaint of right shoulder pain, and found to have a diagnosis of right shoulder OA.  They were brought to the operating room on 03/03/2020 and underwent the above named procedures.  Pt awoke from anesthesia without complication and was transferred to the floor. On POD1, patient was doing well and pain was controlled. She ambulated without significant difficulty. She was discharged home on POD1.  Pt will f/u with Dr. Marlou Sa in clinic in ~2 weeks.   Antibiotics given:  Anti-infectives (From admission, onward)   Start     Dose/Rate Route Frequency Ordered Stop   03/03/20 2030  ceFAZolin (ANCEF) IVPB 1 g/50 mL premix        1 g 100 mL/hr over 30 Minutes Intravenous Every 6 hours 03/03/20 2016 03/04/20 1013   03/03/20 1202  vancomycin (VANCOCIN) powder  Status:  Discontinued          As needed 03/03/20 1202 03/03/20 1452   03/03/20 1015  ceFAZolin (ANCEF) IVPB 2g/100 mL premix        2 g 200 mL/hr over 30 Minutes Intravenous On call to O.R. 03/03/20 1004 03/03/20 1133    .  Recent vital signs:  Vitals:   03/04/20 0816 03/04/20 1357  BP: 129/69 (!) 120/58  Pulse: 89 87  Resp: 17 15  Temp: 98.3 F (36.8 C) 98.3 F (36.8 C)  SpO2: 97% 95%    Recent laboratory studies:  Results for orders placed or performed during the hospital encounter of 03/03/20  Glucose, capillary  Result Value Ref Range   Glucose-Capillary 100 (H) 70 - 99 mg/dL  Glucose, capillary  Result Value Ref Range    Glucose-Capillary 116 (H) 70 - 99 mg/dL    Discharge Medications:   Allergies as of 03/04/2020      Reactions   Horse-derived Products    Reaction as a child "heart stopped"   Sulfa Antibiotics Hives   Sulfamethoxazole-trimethoprim Rash   Tetanus Toxoid Adsorbed Palpitations   As a child      Medication List    STOP taking these medications   Breztri Aerosphere 160-9-4.8 MCG/ACT Aero Generic drug: Budeson-Glycopyrrol-Formoterol   fluconazole 100 MG tablet Commonly known as: DIFLUCAN   predniSONE 10 MG tablet Commonly known as: DELTASONE     TAKE these medications   anastrozole 1 MG tablet Commonly known as: ARIMIDEX Take 1 tablet (1 mg total) by mouth daily.   aspirin 81 MG EC tablet Take 1 tablet (81 mg total) by mouth daily. Swallow whole.   Calcium Citrate Chewy Bite 500-500 MG-UNIT chewable tablet Generic drug: calcium citrate-vitamin D Chew 500 mg by mouth daily. Notes to patient: Resume home regimen   cholecalciferol 25 MCG (1000 UNIT) tablet Commonly known as: VITAMIN D Take 1,000 Units by mouth daily at 2 PM. Notes to patient: Resume home regimen   docusate sodium 100 MG capsule Commonly known as: COLACE Take 100 mg by mouth at bedtime.   esomeprazole 20 MG capsule Commonly known as: NEXIUM Take 40 mg by mouth daily before breakfast.  Notes to patient: Resume home regimen   famotidine 20 MG tablet Commonly known as: PEPCID Take 20 mg by mouth daily after supper. Notes to patient: Resume home regimen   guaiFENesin 600 MG 12 hr tablet Commonly known as: MUCINEX Take 600 mg by mouth at bedtime. Notes to patient: Resume home regimen   IRON 100/C PO Take 1 tablet by mouth at bedtime. BariMelts Iron + Vitamin C Notes to patient: Resume home regimen   losartan-hydrochlorothiazide 100-25 MG tablet Commonly known as: HYZAAR Take 1 tablet by mouth daily. Notes to patient: Resume home regimen   Magnesium 400 MG Caps Take 400 mg by mouth every  evening. Notes to patient: Resume home regimen   Melatonin 10 MG Tabs Take 10-20 mg by mouth at bedtime as needed (sleep). Notes to patient: Resume home regimen   methocarbamol 500 MG tablet Commonly known as: ROBAXIN Take 1 tablet (500 mg total) by mouth every 8 (eight) hours as needed for muscle spasms.   multivitamin with minerals Tabs tablet Take 1 tablet by mouth in the morning and at bedtime. Celebrate Multivitamin Notes to patient: Resume home regimen   oxyCODONE 5 MG immediate release tablet Commonly known as: Oxy IR/ROXICODONE Take 1 tablet (5 mg total) by mouth every 4 (four) hours as needed for moderate pain (pain score 4-6).   pravastatin 40 MG tablet Commonly known as: PRAVACHOL Take 40 mg by mouth at bedtime.   PROBIOTIC DAILY PO Take 1 capsule by mouth daily at 2 PM. Notes to patient: Resume home regimen   Trelegy Ellipta 200-62.5-25 MCG/INH Aepb Generic drug: Fluticasone-Umeclidin-Vilant Inhale 1 puff into the lungs daily. What changed:   when to take this  reasons to take this Notes to patient: Resume home regimen   Vitamin B-12 2500 MCG Subl Place 2,500 mcg under the tongue daily. Notes to patient: Resume home regimen   Vitamin K2 100 MCG Caps Take 100 mcg by mouth daily at 2 PM. Notes to patient: Resume home regimen       Diagnostic Studies: DG Shoulder Right Port  Result Date: 03/03/2020 CLINICAL DATA:  Post right shoulder surgery EXAM: PORTABLE RIGHT SHOULDER COMPARISON:  CT 12/17/2019 FINDINGS: Single AP portable view of the right shoulder demonstrating interval placement of a reverse right shoulder arthroplasty. Atypical projection limits evaluation of the alignment though does appear to be in grossly normal articulation on this limited single view. Expected postsurgical soft tissue changes including soft tissue and intra-articular gas are noted. Moderate acromioclavicular arthrosis is seen. Surgical clips again seen in the right breast. Likely  cholecystectomy clips projecting over the right upper quadrant. Atelectatic changes in the imaged portions of the lungs. Telemetry leads overlie the chest and upper abdomen. IMPRESSION: 1. Status post reverse right shoulder arthroplasty without evidence of acute postoperative complication. Electronically Signed   By: Lovena Le M.D.   On: 03/03/2020 15:35    Disposition: Discharge disposition: 01-Home or Self Care       Discharge Instructions    Call MD / Call 911   Complete by: As directed    If you experience chest pain or shortness of breath, CALL 911 and be transported to the hospital emergency room.  If you develope a fever above 101 F, pus (white drainage) or increased drainage or redness at the wound, or calf pain, call your surgeon's office.   Constipation Prevention   Complete by: As directed    Drink plenty of fluids.  Prune juice may be helpful.  You  may use a stool softener, such as Colace (over the counter) 100 mg twice a day.  Use MiraLax (over the counter) for constipation as needed.   Diet - low sodium heart healthy   Complete by: As directed    Discharge instructions   Complete by: As directed    You may shower, dressing is waterproof.  Do not bathe or soak the operative shoulder in a tub, pool.  Use the CPM machine 3 times a day for one hour each time.  No lifting with the operative shoulder. Continue use of the sling.  Follow-up with Dr. Marlou Sa in ~2 weeks on your given appointment date.  We will remove your adhesive bandage at that time.    Dental Antibiotics:  In most cases prophylactic antibiotics for Dental procdeures after total joint surgery are not necessary.  Exceptions are as follows:  1. History of prior total joint infection  2. Severely immunocompromised (Organ Transplant, cancer chemotherapy, Rheumatoid biologic meds such as [place])  3. Poorly controlled diabetes (A1C &gt; 8.0, blood glucose over 200)  If you have one of these conditions, contact  your surgeon for an antibiotic prescription, prior to your dental procedure.   Increase activity slowly as tolerated   Complete by: As directed        Follow-up Information    [facility] Follow [facility].   Specialty: [place] Why: [facility] will be providing you with physical therapy at home.  You will receive a call within 24- 48 hours of your discharge home. Contact information: [address] [place] [address] [phone]        [facility] Follow up.   Why: [employer] will be providing you with a cane before your are discharged home today. Contact information: [address] [place] [address] [phone]                Signed: Donella Stade 03/19/2020, 9:34 AM

## 2020-03-19 NOTE — Telephone Encounter (Signed)
I called.

## 2020-03-21 ENCOUNTER — Encounter: Payer: Self-pay | Admitting: Orthopedic Surgery

## 2020-03-21 NOTE — Progress Notes (Signed)
Post-Op Visit Note   Patient: Caitlin [name]           Date of Birth: [birth year]           MRN: [id number] Visit Date: 03/18/2020 PCP: Sharilyn Sites, MD   Assessment & Plan:  Chief Complaint:  Chief Complaint  Patient presents with  . Right Shoulder - Routine Post Op   Visit Diagnoses:  1. Status post total shoulder arthroplasty, right     Plan: Verna is now 2 weeks out right reverse shoulder replacement.  105 on the CPM.  Deltoid is functional.  Incision intact.  Physical therapy to start next week.  DC sling.  Less than 5 pounds lifting.  Start therapy at [facility] for active assisted range of motion and passive range of motion.  Op note provided.  Taking occasional pain meds.  Radiographs look good.  Follow-up in 4 weeks with [facility] for clinical assessment of range of motion.  Follow-Up Instructions: No follow-ups on file.   Orders:  Orders Placed This Encounter  Procedures  . XR Shoulder Right  . Ambulatory referral to Occupational Therapy   No orders of the defined types were placed in this encounter.   Imaging: No results found.  PMFS History: Patient Active Problem List   Diagnosis Date Noted  . S/P reverse total shoulder arthroplasty, right 03/03/2020  . Primary osteoarthritis, right shoulder 12/04/2019  . Chronic right shoulder pain 11/29/2018  . Localized swelling of left lower extremity 07/19/2018  . Cellulitis and abscess of left leg 06/15/2018  . Unilateral primary osteoarthritis, left knee 05/02/2017  . S/P total knee replacement using cement, left 05/02/2017  . Primary osteoarthritis of left knee 12/21/2016  . Chondromalacia patellae, left knee 05/26/2016  . Breast cancer of upper-inner quadrant of right female breast ([place]) 11/04/2015  . GERD (gastroesophageal reflux disease) 09/02/2013  . Obesity 09/02/2013  . Ventral hernia, recurrent 09/02/2013  . Unspecified asthma(493.90) 12/12/2012  . Cough variant asthma 03/27/2012  . Right middle lobe  syndrome 03/27/2012   Past Medical History:  Diagnosis Date  . Arthritis   . Asthma   . Breast cancer ([place])   . Breast disorder    cancer  . Breast mass    benign  . Cellulitis   . GERD (gastroesophageal reflux disease)   . Heart murmur    AS CHILD  OUTGREW IT  . History of hiatal hernia   . Hyperlipidemia   . Hypertension   . Impaired fasting glucose   . Rectocele   . Varicose vein of leg     Family History  Problem Relation Age of Onset  . Colon cancer Father   . Rectal cancer Father   . Prostate cancer Father   . Dementia Mother   . Osteoporosis Mother   . Healthy Daughter   . Healthy Son   . Pancreatic cancer Paternal Grandfather   . Heart disease Paternal Grandmother   . Heart disease Maternal Grandfather   . Breast cancer Maternal Grandmother   . Scoliosis Sister     Past Surgical History:  Procedure Laterality Date  . BREAST LUMPECTOMY WITH RADIOACTIVE SEED AND SENTINEL LYMPH NODE BIOPSY Right 11/11/2015   Procedure: RIGHT BREAST RADIOACTIVE SEED GUIDED  LUMPECTOMY AND RADIOACTIVE SEED TARGETED SENTINEL LYMPH NODE BIOPSY;  Surgeon: Stark Klein, MD;  Location: [facility];  Service: General;  Laterality: Right;  . BREAST SURGERY Left    breast biopies x4  . CHOLECYSTECTOMY  09/08/2010  .  COLONOSCOPY    . COLONOSCOPY N/A 09/25/2013   Procedure: COLONOSCOPY;  Surgeon: Rogene Houston, MD;  [facility];  Service: Endoscopy;  Laterality: N/A;  100-moved to 1200 Ann to notify pt  . HERNIA REPAIR  12/24/2010, 12-2014   TOTAL 4  . LAPAROSCOPIC GASTRIC SLEEVE RESECTION  11-2013   at [facility]  2007/2008  . REVERSE SHOULDER ARTHROPLASTY Right 03/03/2020   Procedure: RIGHT REVERSE SHOULDER ARTHROPLASTY;  Surgeon: Meredith Pel, MD;  Location: [facility];  Service: Orthopedics;  Laterality: Right;  . TONSILLECTOMY  1968  . TOTAL KNEE ARTHROPLASTY Left 05/02/2017  . TOTAL KNEE ARTHROPLASTY Left 05/02/2017    Procedure: LEFT TOTAL KNEE ARTHROPLASTY;  Surgeon: [name], MD;  Location: [facility];  Service: Orthopedics;  Laterality: Left;  . UPPER GASTROINTESTINAL ENDOSCOPY    . VAGINAL HYSTERECTOMY  1980s   Social History   Occupational History  . Occupation: retired    Fish farm manager: RETIRED    Comment: teacher  Tobacco Use  . Smoking status: Never Smoker  . Smokeless tobacco: Never Used  Vaping Use  . Vaping Use: Never used  Substance and Sexual Activity  . Alcohol use: No  . Drug use: No  . Sexual activity: Yes    Birth control/protection: Surgical    Comment: hyst

## 2020-03-24 DIAGNOSIS — K5900 Constipation, unspecified: Secondary | ICD-10-CM | POA: Diagnosis not present

## 2020-03-24 DIAGNOSIS — I10 Essential (primary) hypertension: Secondary | ICD-10-CM | POA: Diagnosis not present

## 2020-03-24 DIAGNOSIS — Z471 Aftercare following joint replacement surgery: Secondary | ICD-10-CM | POA: Diagnosis not present

## 2020-03-24 DIAGNOSIS — J45991 Cough variant asthma: Secondary | ICD-10-CM | POA: Diagnosis not present

## 2020-03-24 DIAGNOSIS — N816 Rectocele: Secondary | ICD-10-CM | POA: Diagnosis not present

## 2020-03-24 DIAGNOSIS — E669 Obesity, unspecified: Secondary | ICD-10-CM | POA: Diagnosis not present

## 2020-03-25 ENCOUNTER — Encounter: Payer: Self-pay | Admitting: Physician Assistant

## 2020-03-25 ENCOUNTER — Other Ambulatory Visit: Payer: Self-pay

## 2020-03-25 ENCOUNTER — Ambulatory Visit (INDEPENDENT_AMBULATORY_CARE_PROVIDER_SITE_OTHER): Payer: Medicare Other | Admitting: Physician Assistant

## 2020-03-25 DIAGNOSIS — I83029 Varicose veins of left lower extremity with ulcer of unspecified site: Secondary | ICD-10-CM | POA: Diagnosis not present

## 2020-03-25 DIAGNOSIS — L97929 Non-pressure chronic ulcer of unspecified part of left lower leg with unspecified severity: Secondary | ICD-10-CM

## 2020-03-25 NOTE — Progress Notes (Signed)
   New Patient Visit  Subjective  Caitlin [name] is a 74 y.o. female who presents for the following: Skin Problem (Had vascular ulcer on left lower leg. x 6 months. Not healing. Dr. Johnnye Sima ( infectious disease) patient did have cellulitus couple years ago. Patient has been doing compression socks which seem to help some. ). Scabbed area left medial lower leg about 6 months ago. To start with it was a dark red spot. She has been using support hose. She used mupirocin in the beginning. They did unna wrap it for a week in the beginning. It does not seem to be totally healed.. She has been seen at [facility] in the past for her leg veins.  Objective  Well appearing patient in no apparent distress; mood and affect are within normal limits.  A focused examination was performed including lower legs. Relevant physical exam findings are noted in the Assessment and Plan.  Objective  Left Lower Leg - Anterior: Small but thick scab on medial calf of left leg.  Assessment & Plan  Venous ulcer of left leg  Left Lower Leg - Anterior  Soaked scab with hydrogen peroxide and removed it. Underneath the scab the ulceration was almost totally healed except for a very fine line where the scab had been attached. Advised pt. To apply mupirocin that she has at home daily under a bandage for the next 3 weeks and the area should finish healing. We will recheck her leg and do a skin check at her next visit.

## 2020-03-26 ENCOUNTER — Other Ambulatory Visit: Payer: Self-pay | Admitting: Surgical

## 2020-03-26 NOTE — Telephone Encounter (Signed)
Pls advise.  

## 2020-03-27 ENCOUNTER — Other Ambulatory Visit: Payer: Self-pay | Admitting: Surgical

## 2020-03-30 NOTE — Telephone Encounter (Signed)
Please advise 

## 2020-04-02 ENCOUNTER — Encounter (HOSPITAL_COMMUNITY): Payer: Self-pay

## 2020-04-02 ENCOUNTER — Ambulatory Visit (HOSPITAL_COMMUNITY): Payer: Medicare Other | Attending: Orthopedic Surgery

## 2020-04-02 ENCOUNTER — Other Ambulatory Visit: Payer: Self-pay

## 2020-04-02 DIAGNOSIS — M25511 Pain in right shoulder: Secondary | ICD-10-CM | POA: Insufficient documentation

## 2020-04-02 DIAGNOSIS — R29898 Other symptoms and signs involving the musculoskeletal system: Secondary | ICD-10-CM | POA: Insufficient documentation

## 2020-04-02 DIAGNOSIS — M25611 Stiffness of right shoulder, not elsewhere classified: Secondary | ICD-10-CM

## 2020-04-02 NOTE — Addendum Note (Signed)
Addended by: Ailene Ravel D on: 04/02/2020 06:09 PM   Modules accepted: Orders

## 2020-04-02 NOTE — Patient Instructions (Signed)

## 2020-04-02 NOTE — Therapy (Signed)
[facility] [place], [place], [address] Phone: [phone]   Fax:  [phone]  Occupational Therapy Evaluation  Patient Details  Name: Caitlin [name] MRN: [id number] Date of Birth: 07-12-1946 Referring Provider (OT): Meredith Pel, MD   Encounter Date: 04/02/2020   OT End of Session - 04/02/20 1502    Visit Number 1    Number of Visits 12    Date for OT Re-Evaluation 05/14/20    Authorization Type Medicare- Part A, Generic [employer]    Progress Note Due on Visit 10    OT Start Time 1306    OT Stop Time 1347    OT Time Calculation (min) 41 min    Activity Tolerance Patient tolerated treatment well    Behavior During Therapy [facility] for tasks assessed/performed           Past Medical History:  Diagnosis Date  . Arthritis   . Asthma   . Breast cancer ([place])   . Breast disorder    cancer  . Breast mass    benign  . Cellulitis   . GERD (gastroesophageal reflux disease)   . Heart murmur    AS CHILD  OUTGREW IT  . History of hiatal hernia   . Hyperlipidemia   . Hypertension   . Impaired fasting glucose   . Rectocele   . Varicose vein of leg     Past Surgical History:  Procedure Laterality Date  . BREAST LUMPECTOMY WITH RADIOACTIVE SEED AND SENTINEL LYMPH NODE BIOPSY Right 11/11/2015   Procedure: RIGHT BREAST RADIOACTIVE SEED GUIDED  LUMPECTOMY AND RADIOACTIVE SEED TARGETED SENTINEL LYMPH NODE BIOPSY;  Surgeon: Stark Klein, MD;  Location: [facility];  Service: General;  Laterality: Right;  . BREAST SURGERY Left    breast biopies x4  . CHOLECYSTECTOMY  09/08/2010  . COLONOSCOPY    . COLONOSCOPY N/A 09/25/2013   Procedure: COLONOSCOPY;  Surgeon: Rogene Houston, MD;  [facility];  Service: Endoscopy;  Laterality: N/A;  100-moved to 1200 Ann to notify pt  . HERNIA REPAIR  12/24/2010, 12-2014   TOTAL 4  . LAPAROSCOPIC GASTRIC SLEEVE RESECTION  11-2013   at [facility]  2007/2008  . REVERSE SHOULDER ARTHROPLASTY Right 03/03/2020   Procedure: RIGHT REVERSE SHOULDER ARTHROPLASTY;  Surgeon: Meredith Pel, MD;  Location: [facility];  Service: Orthopedics;  Laterality: Right;  . TONSILLECTOMY  1968  . TOTAL KNEE ARTHROPLASTY Left 05/02/2017  . TOTAL KNEE ARTHROPLASTY Left 05/02/2017   Procedure: LEFT TOTAL KNEE ARTHROPLASTY;  Surgeon: [name], MD;  Location: [facility];  Service: Orthopedics;  Laterality: Left;  . UPPER GASTROINTESTINAL ENDOSCOPY    . VAGINAL HYSTERECTOMY  1980s    There were no vitals filed for this visit.   Subjective Assessment - 04/02/20 1316    Subjective  S: It's hard for me to push the car door open    Pertinent History Patient is a 74 year old female, S/P right total shoulder arthroplasty that is completed on 03/03/20. MD discontinued sling at last follow up visit. Dr Marlou Sa referred patient for OT eval and treat.    Patient Stated Goals Be able to help lifting boxes for house moving process, being able to wear a bra comfortably    Currently in Pain? Yes    Pain Score 2     Pain Location Shoulder    Pain Orientation Right  Pain Descriptors / Indicators Sore    Pain Type Acute pain    Pain Radiating Towards N/A    Pain Onset 1 to 4 weeks ago    Pain Frequency Occasional    Aggravating Factors  Overuse    Pain Relieving Factors Tylenol, advil, ice and heat    Effect of Pain on Daily Activities Moderate    Multiple Pain Sites No             [facility] OT Assessment - 04/02/20 1319      Assessment   Medical Diagnosis Right total shoulder arthroplasty    Referring Provider (OT) Meredith Pel, MD    Onset Date/Surgical Date 03/03/20    Hand Dominance Right    Next MD Visit 04/15/20    Prior Therapy None      Precautions   Precautions Shoulder    Type of Shoulder Precautions P/ROM and AA/ROM only      Restrictions   Weight Bearing Restrictions Yes      Balance Screen   Has the patient fallen in the  past 6 months Yes    How many times? twice    Has the patient had a decrease in activity level because of a fear of falling?  No    Is the patient reluctant to leave their home because of a fear of falling?  No      Home  Environment   Family/patient expects to be discharged to: Private residence    Living Arrangements Spouse/significant other      Prior Function   Level of [place] Retired      ADL   ADL comments Challenges with strength, ROM, stability at the shoulder for writing,       Mobility   Mobility Status [place] Wears glasses only for reading      Cognition   Overall Cognitive Status Within Functional Limits for tasks assessed      Observation/Other Assessments   Focus on Therapeutic Outcomes (FOTO)  completed next session.      ROM / Strength   AROM / PROM / Strength AROM;PROM;Strength      Palpation   Palpation comment Max fascial restrictions in right upper arm. trapezius, and scapularis region.       AROM   Overall AROM Comments Assessed seated. IR/er adducted    AROM Assessment Site Shoulder    Right/Left Shoulder Right    Right Shoulder Flexion 115 Degrees    Right Shoulder ABduction 60 Degrees    Right Shoulder Internal Rotation 90 Degrees    Right Shoulder External Rotation 20 Degrees      PROM   Overall PROM Comments Assessed supine. IR/er adducted    PROM Assessment Site Shoulder    Right/Left Shoulder Right    Right Shoulder Flexion 120 Degrees    Right Shoulder ABduction 120 Degrees    Right Shoulder Internal Rotation 90 Degrees    Right Shoulder External Rotation 39 Degrees      Strength   Overall Strength Comments Assessed seated. IR/er adducted    Strength Assessment Site Shoulder    Right/Left Shoulder Right    Right Shoulder Flexion 3-/5    Right Shoulder ABduction 3-/5    Right Shoulder Internal Rotation 3/5    Right Shoulder External Rotation 3-/5  OT Education - 04/02/20 1500    Education Details Provided HEP for AA/ROM exercises, reviewed shoulder precautions    Person(s) Educated Patient    Methods Explanation;Demonstration;Verbal cues;Handout    Comprehension Verbalized understanding;Returned demonstration;Need further instruction            OT Short Term Goals - 04/02/20 1742      OT SHORT TERM GOAL #1   Title Patient will increase RUE P/ROM to [facility] in order to increase ability to increase dressing tasks such as donning/doffing bra with less difficulty.    Time 3    Period Weeks    Status New    Target Date 04/23/20      OT SHORT TERM GOAL #2   Title Patient will increase RUE strength to 3+/5 in order to complete waist level activities.    Time 3    Period Weeks    Status New    Target Date 04/23/20      OT SHORT TERM GOAL #3   Title Patient will decrease RUE fascial restrictions to mod amount in order to increase functional mobility needed to complete reaching tasks at or below shoulder level.    Time 3    Period Weeks    Status New    Target Date 04/23/20      OT SHORT TERM GOAL #4   Title Patient will be educated and independent with HEP in order to increase functional use of her RUE during basic ADL tasks.    Time 3    Period Weeks    Status New    Target Date 04/23/20             OT Long Term Goals - 04/02/20 1747      OT LONG TERM GOAL #1   Title Patient will improve RUE A/ROM to [facility] in order to complete high level reaching tasks above shoulder level.    Time 6    Period Weeks    Status New    Target Date 05/14/20      OT LONG TERM GOAL #2   Title Patient will report having a pain level of no more than 2/10 while completing daily tasks around the home.    Time 6    Period Weeks    Status New    Target Date 05/14/20      OT LONG TERM GOAL #3   Title Patient will increase RUE strength to 4+/5 in order to participate in carrying light boxes and other  objects during moving process.    Time 6    Period Weeks    Status New    Target Date 05/14/20      OT LONG TERM GOAL #4   Title Patient will decrease RUE fascial restrictions to min amount in order to increase functional mobility needed to complete reaching tasks with minimal pain.    Time 6    Period Weeks    Status New    Target Date 05/14/20      OT LONG TERM GOAL #5   Title Patient will demonstrate independence with functional use of her dominant arm for at least 75% of daily tasks.    Time 6    Period Weeks    Status New    Target Date 05/14/20                 Plan - 04/02/20 1504    Clinical Impression Statement A: Patient is a 74 year old female, S/P  right total shoulder arthroplasty causing increased pain, fascial restrictions and decreased ROM and strength resulting in difficulty completing ADL tasks using RUE as dominant.    OT Occupational Profile and History Problem Focused Assessment - Including review of records relating to presenting problem    Occupational performance deficits (Please refer to evaluation for details): ADL's;IADL's;Rest and Sleep;Leisure    Body Structure / Function / Physical Skills ADL;IADL;ROM;Strength;Pain;UE functional use;Decreased knowledge of precautions;Fascial restriction    Rehab Potential Good    Clinical Decision Making Several treatment options, min-mod task modification necessary    Comorbidities Affecting Occupational Performance: May have comorbidities impacting occupational performance    Modification or Assistance to Complete Evaluation  Min-Moderate modification of tasks or assist with assess necessary to complete eval    OT Frequency 2x / week    OT Duration 6 weeks    OT Treatment/Interventions Moist Heat;Therapeutic activities;Therapeutic exercise;Passive range of motion;Cryotherapy;Electrical Stimulation;Manual Therapy;Patient/family education;Energy conservation;Ultrasound;Self-care/ADL training    Plan P: Patient will  benefit from skilled OT services to increase functional performance while using her RUE for ADLs. Treatment plan: myofascial release, passive stretching, and AA/ROM, A/ROM, general shoulder strengthening, modalities prn    OT Home Exercise Plan Eval 9/2: Provided AA/ROM exercises    Consulted and Agree with Plan of Care Patient           Patient will benefit from skilled therapeutic intervention in order to improve the following deficits and impairments:   Body Structure / Function / Physical Skills: ADL, IADL, ROM, Strength, Pain, UE functional use, Decreased knowledge of precautions, Fascial restriction       Visit Diagnosis: Acute pain of right shoulder  Stiffness of right shoulder, not elsewhere classified  Other symptoms and signs involving the musculoskeletal system    Problem List Patient Active Problem List   Diagnosis Date Noted  . S/P reverse total shoulder arthroplasty, right 03/03/2020  . Primary osteoarthritis, right shoulder 12/04/2019  . Chronic right shoulder pain 11/29/2018  . Localized swelling of left lower extremity 07/19/2018  . Cellulitis and abscess of left leg 06/15/2018  . Unilateral primary osteoarthritis, left knee 05/02/2017  . S/P total knee replacement using cement, left 05/02/2017  . Primary osteoarthritis of left knee 12/21/2016  . Chondromalacia patellae, left knee 05/26/2016  . Breast cancer of upper-inner quadrant of right female breast ([place]) 11/04/2015  . GERD (gastroesophageal reflux disease) 09/02/2013  . Obesity 09/02/2013  . Ventral hernia, recurrent 09/02/2013  . Unspecified asthma(493.90) 12/12/2012  . Cough variant asthma 03/27/2012  . Right middle lobe syndrome 03/27/2012    [place], [place] Student [phone]   04/02/2020, 6:06 PM  [facility] [address] [place], [place], [address] Phone: [phone]   Fax:  [phone]  Name: Caitlin [name] MRN: [id number] Date of  Birth: 1945-08-14

## 2020-04-06 DIAGNOSIS — Z471 Aftercare following joint replacement surgery: Secondary | ICD-10-CM | POA: Diagnosis not present

## 2020-04-07 ENCOUNTER — Ambulatory Visit (HOSPITAL_COMMUNITY): Payer: Medicare Other

## 2020-04-07 ENCOUNTER — Encounter (HOSPITAL_COMMUNITY): Payer: Self-pay

## 2020-04-07 ENCOUNTER — Other Ambulatory Visit: Payer: Self-pay

## 2020-04-07 DIAGNOSIS — R29898 Other symptoms and signs involving the musculoskeletal system: Secondary | ICD-10-CM

## 2020-04-07 DIAGNOSIS — M25511 Pain in right shoulder: Secondary | ICD-10-CM

## 2020-04-07 DIAGNOSIS — M25611 Stiffness of right shoulder, not elsewhere classified: Secondary | ICD-10-CM | POA: Diagnosis not present

## 2020-04-07 NOTE — Therapy (Addendum)
[place], [place], [address] Phone: [phone]   Fax:  [phone]  Occupational Therapy Treatment  Patient Details  Name: Caitlin [name] MRN: [id number] Date of Birth: 1945/11/10 Referring Provider (OT): Meredith Pel, MD   Encounter Date: 04/07/2020   OT End of Session - 04/07/20 1526    Visit Number 2    Number of Visits 12    Date for OT Re-Evaluation 05/14/20    Authorization Type Medicare- Part A, Generic [employer]    Progress Note Due on Visit 10    OT Start Time 1433    OT Stop Time 1519    OT Time Calculation (min) 46 min    Activity Tolerance Patient tolerated treatment well    Behavior During Therapy [facility] for tasks assessed/performed           Past Medical History:  Diagnosis Date  . Arthritis   . Asthma   . Breast cancer ([place])   . Breast disorder    cancer  . Breast mass    benign  . Cellulitis   . GERD (gastroesophageal reflux disease)   . Heart murmur    AS CHILD  OUTGREW IT  . History of hiatal hernia   . Hyperlipidemia   . Hypertension   . Impaired fasting glucose   . Rectocele   . Varicose vein of leg     Past Surgical History:  Procedure Laterality Date  . BREAST LUMPECTOMY WITH RADIOACTIVE SEED AND SENTINEL LYMPH NODE BIOPSY Right 11/11/2015   Procedure: RIGHT BREAST RADIOACTIVE SEED GUIDED  LUMPECTOMY AND RADIOACTIVE SEED TARGETED SENTINEL LYMPH NODE BIOPSY;  Surgeon: Stark Klein, MD;  Location: [place];  Service: General;  Laterality: Right;  . BREAST SURGERY Left    breast biopies x4  . CHOLECYSTECTOMY  09/08/2010  . COLONOSCOPY    . COLONOSCOPY N/A 09/25/2013   Procedure: COLONOSCOPY;  Surgeon: Rogene Houston, MD;  [facility];  Service: Endoscopy;  Laterality: N/A;  100-moved to 1200 Ann to notify pt  . HERNIA REPAIR  12/24/2010, 12-2014   TOTAL 4  . LAPAROSCOPIC GASTRIC SLEEVE RESECTION  11-2013   at [place]  2007/2008  . REVERSE SHOULDER ARTHROPLASTY Right 03/03/2020   Procedure: RIGHT REVERSE SHOULDER ARTHROPLASTY;  Surgeon: Meredith Pel, MD;  Location: [place];  Service: Orthopedics;  Laterality: Right;  . TONSILLECTOMY  1968  . TOTAL KNEE ARTHROPLASTY Left 05/02/2017  . TOTAL KNEE ARTHROPLASTY Left 05/02/2017   Procedure: LEFT TOTAL KNEE ARTHROPLASTY;  Surgeon: [name], MD;  Location: [place];  Service: Orthopedics;  Laterality: Left;  . UPPER GASTROINTESTINAL ENDOSCOPY    . VAGINAL HYSTERECTOMY  1980s    There were no vitals filed for this visit.   Subjective Assessment - 04/07/20 1523    Subjective  S: Is there anything we can work on to help me be able to push my seatbelt into the holder?    Special Tests FOTO 55%    Currently in Pain? No/denies              [facility] OT Assessment - 04/07/20 1504      Assessment   Medical Diagnosis Right total shoulder arthroplasty      Precautions   Precautions Shoulder    Type of Shoulder Precautions P/ROM and AA/ROM only      Restrictions   Weight Bearing Restrictions Yes  OT Treatments/Exercises (OP) - 04/07/20 1505      Exercises   Exercises Shoulder      Shoulder Exercises: Supine   Protraction PROM;5 reps;AAROM;10 reps    Horizontal ABduction PROM;5 reps;AAROM;10 reps    External Rotation PROM;5 reps;AAROM;10 reps    Internal Rotation PROM;5 reps;AAROM;10 reps    Flexion PROM;5 reps;AAROM;10 reps    ABduction PROM;5 reps;AAROM;10 reps      Shoulder Exercises: Pulleys   Flexion 1 minute    ABduction 1 minute      Shoulder Exercises: ROM/Strengthening   Wall Wash 1'                  OT Education - 04/07/20 1537    Education Details Educated on using to reduce pain and soreness at home after therapy, reviewed shoudler precautions and therapy goals    Person(s) Educated Patient    Methods Explanation    Comprehension Verbalized understanding            OT  Short Term Goals - 04/07/20 1535      OT SHORT TERM GOAL #1   Title Patient will increase RUE P/ROM to [facility] in order to increase ability to increase dressing tasks such as donning/doffing bra with less difficulty.    Time 3    Period Weeks    Status On-going    Target Date 04/23/20      OT SHORT TERM GOAL #2   Title Patient will increase RUE strength to 3+/5 in order to complete waist level activities.    Time 3    Period Weeks    Status On-going    Target Date 04/23/20      OT SHORT TERM GOAL #3   Title Patient will decrease RUE fascial restrictions to mod amount in order to increase functional mobility needed to complete reaching tasks at or below shoulder level.    Time 3    Period Weeks    Status On-going    Target Date 04/23/20      OT SHORT TERM GOAL #4   Title Patient will be educated and independent with HEP in order to increase functional use of her RUE during basic ADL tasks.    Time 3    Period Weeks    Status On-going    Target Date 04/23/20             OT Long Term Goals - 04/07/20 1446      OT LONG TERM GOAL #1   Title Patient will improve RUE A/ROM to [facility] in order to complete high level reaching tasks above shoulder level.    Time 6    Period Weeks    Status On-going      OT LONG TERM GOAL #2   Title Patient will report having a pain level of no more than 2/10 while completing daily tasks around the home.    Time 6    Period Weeks    Status On-going      OT LONG TERM GOAL #3   Title Patient will increase RUE strength to 4+/5 in order to participate in carrying light boxes and other objects during moving process.    Time 6    Period Weeks    Status On-going      OT LONG TERM GOAL #4   Title Patient will decrease RUE fascial restrictions to min amount in order to increase functional mobility needed to complete reaching tasks with minimal pain.  Time 6    Period Weeks    Status On-going      OT LONG TERM GOAL #5   Title Patient will  demonstrate independence with functional use of her dominant arm for at least 75% of daily tasks.    Time 6    Period Weeks    Status On-going                 Plan - 04/07/20 1526    Clinical Impression Statement A: FOTO completed with patient scoring 55% independent. Patient completed AA/ROM while supine, then in standing for wall wash and pulleys, with verbal and tactile cues required for form and positioning, as well as cues to use PVC pipe or pulleys to assist RUE versus actively lifting RUE to stay within patient's shoulder precautions.    Occupational performance deficits (Please refer to evaluation for details): ADL's;IADL's;Rest and Sleep;Leisure    Body Structure / Function / Physical Skills ADL;IADL;ROM;Strength;Pain;UE functional use;Decreased knowledge of precautions;Fascial restriction    OT Treatment/Interventions Moist Heat;Therapeutic activities;Therapeutic exercise;Passive range of motion;Cryotherapy;Electrical Stimulation;Manual Therapy;Patient/family education;Energy conservation;Ultrasound;Self-care/ADL training    Plan P: Manual techniques, AA/ROM    Consulted and Agree with Plan of Care Patient           Patient will benefit from skilled therapeutic intervention in order to improve the following deficits and impairments:   Body Structure / Function / Physical Skills: ADL, IADL, ROM, Strength, Pain, UE functional use, Decreased knowledge of precautions, Fascial restriction       Visit Diagnosis: Acute pain of right shoulder  Stiffness of right shoulder, not elsewhere classified  Other symptoms and signs involving the musculoskeletal system    Problem List Patient Active Problem List   Diagnosis Date Noted  . S/P reverse total shoulder arthroplasty, right 03/03/2020  . Primary osteoarthritis, right shoulder 12/04/2019  . Chronic right shoulder pain 11/29/2018  . Localized swelling of left lower extremity 07/19/2018  . Cellulitis and abscess of left  leg 06/15/2018  . Unilateral primary osteoarthritis, left knee 05/02/2017  . S/P total knee replacement using cement, left 05/02/2017  . Primary osteoarthritis of left knee 12/21/2016  . Chondromalacia patellae, left knee 05/26/2016  . Breast cancer of upper-inner quadrant of right female breast ([place]) 11/04/2015  . GERD (gastroesophageal reflux disease) 09/02/2013  . Obesity 09/02/2013  . Ventral hernia, recurrent 09/02/2013  . Unspecified asthma(493.90) 12/12/2012  . Cough variant asthma 03/27/2012  . Right middle lobe syndrome 03/27/2012    [place], [place] Student [phone]   04/07/2020, 3:48 PM  [place], [place], [address] Phone: [phone]   Fax:  [phone]  Name: ZAELYN NOACK MRN: [id number] Date of Birth: 1945-10-28

## 2020-04-10 ENCOUNTER — Other Ambulatory Visit: Payer: Self-pay

## 2020-04-10 ENCOUNTER — Ambulatory Visit (HOSPITAL_COMMUNITY): Payer: Medicare Other | Admitting: Occupational Therapy

## 2020-04-10 ENCOUNTER — Encounter (HOSPITAL_COMMUNITY): Payer: Self-pay | Admitting: Occupational Therapy

## 2020-04-10 DIAGNOSIS — M25511 Pain in right shoulder: Secondary | ICD-10-CM | POA: Diagnosis not present

## 2020-04-10 DIAGNOSIS — R29898 Other symptoms and signs involving the musculoskeletal system: Secondary | ICD-10-CM | POA: Diagnosis not present

## 2020-04-10 DIAGNOSIS — M25611 Stiffness of right shoulder, not elsewhere classified: Secondary | ICD-10-CM

## 2020-04-10 NOTE — Therapy (Signed)
[place], [place], [address] Phone: [phone]   Fax:  [phone]  Occupational Therapy Treatment  Patient Details  Name: Caitlin [name] MRN: [id number] Date of Birth: 1945/11/09 Referring Provider (OT): Meredith Pel, MD   Encounter Date: 04/10/2020   OT End of Session - 04/10/20 1511    Visit Number 3    Number of Visits 12    Date for OT Re-Evaluation 05/14/20    Authorization Type Medicare- Part A, Generic [employer]    Progress Note Due on Visit 10    OT Start Time 1428    OT Stop Time 1511    OT Time Calculation (min) 43 min    Activity Tolerance Patient tolerated treatment well    Behavior During Therapy [facility] for tasks assessed/performed           Past Medical History:  Diagnosis Date  . Arthritis   . Asthma   . Breast cancer ([place])   . Breast disorder    cancer  . Breast mass    benign  . Cellulitis   . GERD (gastroesophageal reflux disease)   . Heart murmur    AS CHILD  OUTGREW IT  . History of hiatal hernia   . Hyperlipidemia   . Hypertension   . Impaired fasting glucose   . Rectocele   . Varicose vein of leg     Past Surgical History:  Procedure Laterality Date  . BREAST LUMPECTOMY WITH RADIOACTIVE SEED AND SENTINEL LYMPH NODE BIOPSY Right 11/11/2015   Procedure: RIGHT BREAST RADIOACTIVE SEED GUIDED  LUMPECTOMY AND RADIOACTIVE SEED TARGETED SENTINEL LYMPH NODE BIOPSY;  Surgeon: Stark Klein, MD;  Location: [place];  Service: General;  Laterality: Right;  . BREAST SURGERY Left    breast biopies x4  . CHOLECYSTECTOMY  09/08/2010  . COLONOSCOPY    . COLONOSCOPY N/A 09/25/2013   Procedure: COLONOSCOPY;  Surgeon: Rogene Houston, MD;  [facility];  Service: Endoscopy;  Laterality: N/A;  100-moved to 1200 Ann to notify pt  . HERNIA REPAIR  12/24/2010, 12-2014   TOTAL 4  . LAPAROSCOPIC GASTRIC SLEEVE RESECTION  11-2013   at [place]  2007/2008  . REVERSE SHOULDER ARTHROPLASTY Right 03/03/2020   Procedure: RIGHT REVERSE SHOULDER ARTHROPLASTY;  Surgeon: Meredith Pel, MD;  Location: [place];  Service: Orthopedics;  Laterality: Right;  . TONSILLECTOMY  1968  . TOTAL KNEE ARTHROPLASTY Left 05/02/2017  . TOTAL KNEE ARTHROPLASTY Left 05/02/2017   Procedure: LEFT TOTAL KNEE ARTHROPLASTY;  Surgeon: [name], MD;  Location: [place];  Service: Orthopedics;  Laterality: Left;  . UPPER GASTROINTESTINAL ENDOSCOPY    . VAGINAL HYSTERECTOMY  1980s    There were no vitals filed for this visit.   Subjective Assessment - 04/10/20 1428    Subjective  S: I think it's been doing pretty good.    Currently in Pain? No/denies              [facility] OT Assessment - 04/10/20 1428      Assessment   Medical Diagnosis Right total shoulder arthroplasty      Precautions   Precautions Shoulder    Type of Shoulder Precautions P/ROM and AA/ROM only                    OT Treatments/Exercises (OP) - 04/10/20 1432      Exercises  Exercises Shoulder      Shoulder Exercises: Supine   Protraction PROM;5 reps;AAROM;10 reps    Horizontal ABduction PROM;5 reps;AAROM;10 reps    External Rotation PROM;5 reps;AAROM;10 reps    Internal Rotation PROM;5 reps;AAROM;10 reps    Flexion PROM;5 reps;AAROM;10 reps    ABduction PROM;5 reps;AAROM;10 reps      Shoulder Exercises: Seated   Protraction AAROM;10 reps    Horizontal ABduction AAROM;10 reps    External Rotation AAROM;10 reps    Internal Rotation AAROM;10 reps    Flexion AAROM;10 reps    Abduction AAROM;10 reps      Shoulder Exercises: Pulleys   Flexion 1 minute    ABduction 1 minute      Manual Therapy   Manual Therapy Myofascial release    Manual therapy comments completed separately from therapeutic exercises    Myofascial Release myofascial release to right upper arm, anterior shoulder, trapezius, and scapular regions to decrease pain and fascial  restrictions and increase joint ROM                    OT Short Term Goals - 04/07/20 1535      OT SHORT TERM GOAL #1   Title Patient will increase RUE P/ROM to [facility] in order to increase ability to increase dressing tasks such as donning/doffing bra with less difficulty.    Time 3    Period Weeks    Status On-going    Target Date 04/23/20      OT SHORT TERM GOAL #2   Title Patient will increase RUE strength to 3+/5 in order to complete waist level activities.    Time 3    Period Weeks    Status On-going    Target Date 04/23/20      OT SHORT TERM GOAL #3   Title Patient will decrease RUE fascial restrictions to mod amount in order to increase functional mobility needed to complete reaching tasks at or below shoulder level.    Time 3    Period Weeks    Status On-going    Target Date 04/23/20      OT SHORT TERM GOAL #4   Title Patient will be educated and independent with HEP in order to increase functional use of her RUE during basic ADL tasks.    Time 3    Period Weeks    Status On-going    Target Date 04/23/20             OT Long Term Goals - 04/07/20 1446      OT LONG TERM GOAL #1   Title Patient will improve RUE A/ROM to [facility] in order to complete high level reaching tasks above shoulder level.    Time 6    Period Weeks    Status On-going      OT LONG TERM GOAL #2   Title Patient will report having a pain level of no more than 2/10 while completing daily tasks around the home.    Time 6    Period Weeks    Status On-going      OT LONG TERM GOAL #3   Title Patient will increase RUE strength to 4+/5 in order to participate in carrying light boxes and other objects during moving process.    Time 6    Period Weeks    Status On-going      OT LONG TERM GOAL #4   Title Patient will decrease RUE fascial restrictions to min amount in  order to increase functional mobility needed to complete reaching tasks with minimal pain.    Time 6    Period Weeks     Status On-going      OT LONG TERM GOAL #5   Title Patient will demonstrate independence with functional use of her dominant arm for at least 75% of daily tasks.    Time 6    Period Weeks    Status On-going                 Plan - 04/10/20 1501    Clinical Impression Statement A: Completed myofascial release to RUE to address fascial restrictions at beginning of session. Continued with P/ROM with pt tolerating ROM WFL for flexion and abduction. Added AA/ROM in sitting today, continued with pulleys for ROM. Verbal cuing for form and technique.    Body Structure / Function / Physical Skills ADL;IADL;ROM;Strength;Pain;UE functional use;Decreased knowledge of precautions;Fascial restriction    Plan P: Continue with manual techniques, resume wall wash and begin prot/ret/elev/dep    OT Home Exercise Plan Eval 9/2: Provided AA/ROM exercises    Consulted and Agree with Plan of Care Patient           Patient will benefit from skilled therapeutic intervention in order to improve the following deficits and impairments:   Body Structure / Function / Physical Skills: ADL, IADL, ROM, Strength, Pain, UE functional use, Decreased knowledge of precautions, Fascial restriction       Visit Diagnosis: Acute pain of right shoulder  Stiffness of right shoulder, not elsewhere classified  Other symptoms and signs involving the musculoskeletal system    Problem List Patient Active Problem List   Diagnosis Date Noted  . S/P reverse total shoulder arthroplasty, right 03/03/2020  . Primary osteoarthritis, right shoulder 12/04/2019  . Chronic right shoulder pain 11/29/2018  . Localized swelling of left lower extremity 07/19/2018  . Cellulitis and abscess of left leg 06/15/2018  . Unilateral primary osteoarthritis, left knee 05/02/2017  . S/P total knee replacement using cement, left 05/02/2017  . Primary osteoarthritis of left knee 12/21/2016  . Chondromalacia patellae, left knee 05/26/2016   . Breast cancer of upper-inner quadrant of right female breast ([place]) 11/04/2015  . GERD (gastroesophageal reflux disease) 09/02/2013  . Obesity 09/02/2013  . Ventral hernia, recurrent 09/02/2013  . Unspecified asthma(493.90) 12/12/2012  . Cough variant asthma 03/27/2012  . Right middle lobe syndrome 03/27/2012   Guadelupe Sabin, OTR/L  [phone] 04/10/2020, 3:12 PM  [place], [place], [address] Phone: [phone]   Fax:  [phone]  Name: GRAYSEN DEPAULA MRN: [id number] Date of Birth: 05-Nov-1945

## 2020-04-13 ENCOUNTER — Telehealth (HOSPITAL_COMMUNITY): Payer: Self-pay

## 2020-04-13 NOTE — Telephone Encounter (Signed)
pt cancelled appt for 9/17 because she willl be out of town

## 2020-04-15 ENCOUNTER — Encounter (HOSPITAL_COMMUNITY): Payer: Self-pay

## 2020-04-15 ENCOUNTER — Other Ambulatory Visit: Payer: Self-pay

## 2020-04-15 ENCOUNTER — Ambulatory Visit (INDEPENDENT_AMBULATORY_CARE_PROVIDER_SITE_OTHER): Payer: Medicare Other | Admitting: Orthopedic Surgery

## 2020-04-15 ENCOUNTER — Ambulatory Visit (HOSPITAL_COMMUNITY): Payer: Medicare Other

## 2020-04-15 ENCOUNTER — Encounter: Payer: Self-pay | Admitting: Orthopedic Surgery

## 2020-04-15 DIAGNOSIS — Z96611 Presence of right artificial shoulder joint: Secondary | ICD-10-CM

## 2020-04-15 DIAGNOSIS — M25611 Stiffness of right shoulder, not elsewhere classified: Secondary | ICD-10-CM | POA: Diagnosis not present

## 2020-04-15 DIAGNOSIS — R29898 Other symptoms and signs involving the musculoskeletal system: Secondary | ICD-10-CM | POA: Diagnosis not present

## 2020-04-15 DIAGNOSIS — M25511 Pain in right shoulder: Secondary | ICD-10-CM | POA: Diagnosis not present

## 2020-04-15 NOTE — Progress Notes (Signed)
Post-Op Visit Note   Patient: Caitlin [name]           Date of Birth: [birth year]           MRN: [id number] Visit Date: 04/15/2020 PCP: Sharilyn Sites, MD   Assessment & Plan:  Chief Complaint:  Chief Complaint  Patient presents with  . Right Shoulder - Routine Post Op   Visit Diagnoses:  1. Status post total shoulder arthroplasty, right     Plan: Caitlin [name] is a 74 year old patient is now about 6 weeks out right shoulder reverse replacement.  She is doing well with no problems.  Started physical therapy at [facility] rehab last week.  Finished CPM about 2 weeks ago.  She is doing exercises as well as pulley work.  She did get the CPM machine up to 120.  On examination she has forward flexion abduction both above 90 degrees.  Rotator cuff strength is good.  Plan at this time is to continue with rehab for the next 5 weeks.  Follow-up as needed.  15 pound lifetime lifting limit described.  Incision is intact.  Follow-Up Instructions: No follow-ups on file.   Orders:  No orders of the defined types were placed in this encounter.  No orders of the defined types were placed in this encounter.   Imaging: No results found.  PMFS History: Patient Active Problem List   Diagnosis Date Noted  . S/P reverse total shoulder arthroplasty, right 03/03/2020  . Primary osteoarthritis, right shoulder 12/04/2019  . Chronic right shoulder pain 11/29/2018  . Localized swelling of left lower extremity 07/19/2018  . Cellulitis and abscess of left leg 06/15/2018  . Unilateral primary osteoarthritis, left knee 05/02/2017  . S/P total knee replacement using cement, left 05/02/2017  . Primary osteoarthritis of left knee 12/21/2016  . Chondromalacia patellae, left knee 05/26/2016  . Breast cancer of upper-inner quadrant of right female breast ([place]) 11/04/2015  . GERD (gastroesophageal reflux disease) 09/02/2013  . Obesity 09/02/2013  . Ventral hernia, recurrent 09/02/2013  . Unspecified  asthma(493.90) 12/12/2012  . Cough variant asthma 03/27/2012  . Right middle lobe syndrome 03/27/2012   Past Medical History:  Diagnosis Date  . Arthritis   . Asthma   . Breast cancer ([place])   . Breast disorder    cancer  . Breast mass    benign  . Cellulitis   . GERD (gastroesophageal reflux disease)   . Heart murmur    AS CHILD  OUTGREW IT  . History of hiatal hernia   . Hyperlipidemia   . Hypertension   . Impaired fasting glucose   . Rectocele   . Varicose vein of leg     Family History  Problem Relation Age of Onset  . Colon cancer Father   . Rectal cancer Father   . Prostate cancer Father   . Dementia Mother   . Osteoporosis Mother   . Healthy Daughter   . Healthy Son   . Pancreatic cancer Paternal Grandfather   . Heart disease Paternal Grandmother   . Heart disease Maternal Grandfather   . Breast cancer Maternal Grandmother   . Scoliosis Sister     Past Surgical History:  Procedure Laterality Date  . BREAST LUMPECTOMY WITH RADIOACTIVE SEED AND SENTINEL LYMPH NODE BIOPSY Right 11/11/2015   Procedure: RIGHT BREAST RADIOACTIVE SEED GUIDED  LUMPECTOMY AND RADIOACTIVE SEED TARGETED SENTINEL LYMPH NODE BIOPSY;  Surgeon: Stark Klein, MD;  Location: [place];  Service: General;  Laterality: Right;  .  BREAST SURGERY Left    breast biopies x4  . CHOLECYSTECTOMY  09/08/2010  . COLONOSCOPY    . COLONOSCOPY N/A 09/25/2013   Procedure: COLONOSCOPY;  Surgeon: Rogene Houston, MD;  [facility];  Service: Endoscopy;  Laterality: N/A;  100-moved to 1200 Ann to notify pt  . HERNIA REPAIR  12/24/2010, 12-2014   TOTAL 4  . LAPAROSCOPIC GASTRIC SLEEVE RESECTION  11-2013   at [place]  2007/2008  . REVERSE SHOULDER ARTHROPLASTY Right 03/03/2020   Procedure: RIGHT REVERSE SHOULDER ARTHROPLASTY;  Surgeon: Meredith Pel, MD;  Location: [place];  Service: Orthopedics;  Laterality: Right;  . TONSILLECTOMY  1968  . TOTAL  KNEE ARTHROPLASTY Left 05/02/2017  . TOTAL KNEE ARTHROPLASTY Left 05/02/2017   Procedure: LEFT TOTAL KNEE ARTHROPLASTY;  Surgeon: [name], MD;  Location: [place];  Service: Orthopedics;  Laterality: Left;  . UPPER GASTROINTESTINAL ENDOSCOPY    . VAGINAL HYSTERECTOMY  1980s   Social History   Occupational History  . Occupation: retired    Fish farm manager: RETIRED    Comment: teacher  Tobacco Use  . Smoking status: Never Smoker  . Smokeless tobacco: Never Used  Vaping Use  . Vaping Use: Never used  Substance and Sexual Activity  . Alcohol use: No  . Drug use: No  . Sexual activity: Yes    Birth control/protection: Surgical    Comment: hyst

## 2020-04-15 NOTE — Patient Instructions (Signed)
Repeat all exercises 10-15 times, at least once a day.   1) Shoulder Protraction    Begin with elbows by your side, slowly "punch" straight out in front of you.      2) Shoulder Flexion  Standing:     Begin with arms at your side with thumbs pointed up, slowly raise both arms up and forward towards overhead.               3) Horizontal abduction/adduction  Standing:           Begin with arms straight out in front of you, bring out to the side in at "T" shape. Keep arms straight entire time.                 4) Internal & External Rotation   Standing:     Stand with elbows at the side and elbows bent 90 degrees. Move your forearms away from your body, then bring back inward toward the body.     5) Shoulder Abduction  Standing:       Lying on your back begin with your arms flat on the table next to your side. Slowly move your arms out to the side so that they go overhead, in a jumping jack or snow angel movement.

## 2020-04-15 NOTE — Therapy (Addendum)
[facility] [place], [place], [address] Phone: [phone]   Fax:  [phone]  Occupational Therapy Treatment  Patient Details  Name: Caitlin [name] MRN: [id number] Date of Birth: [birth year] Referring Provider (OT): Meredith Pel, MD   Encounter Date: 04/15/2020   OT End of Session - 04/15/20 1615    Visit Number 4    Number of Visits 12    Date for OT Re-Evaluation 05/14/20    Authorization Type Medicare- Part A, Generic [employer]    Progress Note Due on Visit 10    OT Start Time 1519   patient arrived late   OT Stop Time 4    OT Time Calculation (min) 37 min    Activity Tolerance Patient tolerated treatment well    Behavior During Therapy WFL for tasks assessed/performed           Past Medical History:  Diagnosis Date   Arthritis    Asthma    Breast cancer ([place])    Breast disorder    cancer   Breast mass    benign   Cellulitis    GERD (gastroesophageal reflux disease)    Heart murmur    AS CHILD  OUTGREW IT   History of hiatal hernia    Hyperlipidemia    Hypertension    Impaired fasting glucose    Rectocele    Varicose vein of leg     Past Surgical History:  Procedure Laterality Date   BREAST LUMPECTOMY WITH RADIOACTIVE SEED AND SENTINEL LYMPH NODE BIOPSY Right 11/11/2015   Procedure: RIGHT BREAST RADIOACTIVE SEED GUIDED  LUMPECTOMY AND RADIOACTIVE SEED TARGETED SENTINEL LYMPH NODE BIOPSY;  Surgeon: Stark Klein, MD;  Location: [facility];  Service: General;  Laterality: Right;   BREAST SURGERY Left    breast biopies x4   CHOLECYSTECTOMY  09/08/2010   COLONOSCOPY     COLONOSCOPY N/A 09/25/2013   Procedure: COLONOSCOPY;  Surgeon: Rogene Houston, MD;  [facility];  Service: Endoscopy;  Laterality: N/A;  100-moved to 1200 Ann to notify pt   HERNIA REPAIR  12/24/2010, 12-2014   TOTAL 4   LAPAROSCOPIC GASTRIC SLEEVE RESECTION  11-2013   at [facility]  2007/2008   REVERSE SHOULDER ARTHROPLASTY Right 03/03/2020   Procedure: RIGHT REVERSE SHOULDER ARTHROPLASTY;  Surgeon: Meredith Pel, MD;  Location: [facility];  Service: Orthopedics;  Laterality: Right;   TONSILLECTOMY  1968   TOTAL KNEE ARTHROPLASTY Left 05/02/2017   TOTAL KNEE ARTHROPLASTY Left 05/02/2017   Procedure: LEFT TOTAL KNEE ARTHROPLASTY;  Surgeon: [name], MD;  Location: [facility];  Service: Orthopedics;  Laterality: Left;   UPPER GASTROINTESTINAL ENDOSCOPY     VAGINAL HYSTERECTOMY  1980s    There were no vitals filed for this visit.   Subjective Assessment - 04/15/20 1522    Subjective  S: I had my doctor appt and he is really happy with where I am at    Currently in Pain? No/denies              [facility] OT Assessment - 04/15/20 1549      Assessment   Medical Diagnosis Right total shoulder arthroplasty      Precautions   Precautions Shoulder    Type of Shoulder Precautions A/ROM, no advanced strengthening until week 12 , Dr. recommends no more than 15 pounds  OT Treatments/Exercises (OP) - 04/15/20 1523      Exercises   Exercises Shoulder      Shoulder Exercises: Supine   Protraction PROM;5 reps    Horizontal ABduction PROM;5 reps    External Rotation PROM;5 reps    Internal Rotation PROM;5 reps    Flexion PROM;5 reps    ABduction PROM;5 reps      Shoulder Exercises: Standing   Protraction AAROM;10 reps    Horizontal ABduction AAROM;AROM;10 reps    External Rotation AAROM;AROM;10 reps    Internal Rotation AAROM;AROM;10 reps    Flexion AAROM;AROM;10 reps    ABduction AAROM;AROM;10 reps      Manual Therapy   Manual Therapy Soft tissue mobilization    Manual therapy comments completed separately from therapeutic exercises    Soft tissue mobilization Soft tissue mobilization applied to right upper arm, trapezius, and scapularis region to decrease fascial restricitions for  increased mobility                  OT Education - 04/15/20 1612    Education Details A/ROM HEP, discussed plan of treatment following doctor appt to progress with A/ROM and no advanced strengthening until 12 weeks    Person(s) Educated Patient    Methods Explanation;Demonstration;Verbal cues;Handout    Comprehension Verbalized understanding;Returned demonstration;Verbal cues required            OT Short Term Goals - 04/07/20 1535      OT SHORT TERM GOAL #1   Title Patient will increase RUE P/ROM to [facility] in order to increase ability to increase dressing tasks such as donning/doffing bra with less difficulty.    Time 3    Period Weeks    Status On-going    Target Date 04/23/20      OT SHORT TERM GOAL #2   Title Patient will increase RUE strength to 3+/5 in order to complete waist level activities.    Time 3    Period Weeks    Status On-going    Target Date 04/23/20      OT SHORT TERM GOAL #3   Title Patient will decrease RUE fascial restrictions to mod amount in order to increase functional mobility needed to complete reaching tasks at or below shoulder level.    Time 3    Period Weeks    Status On-going    Target Date 04/23/20      OT SHORT TERM GOAL #4   Title Patient will be educated and independent with HEP in order to increase functional use of her RUE during basic ADL tasks.    Time 3    Period Weeks    Status On-going    Target Date 04/23/20             OT Long Term Goals - 04/07/20 1446      OT LONG TERM GOAL #1   Title Patient will improve RUE A/ROM to [facility] in order to complete high level reaching tasks above shoulder level.    Time 6    Period Weeks    Status On-going      OT LONG TERM GOAL #2   Title Patient will report having a pain level of no more than 2/10 while completing daily tasks around the home.    Time 6    Period Weeks    Status On-going      OT LONG TERM GOAL #3   Title Patient will increase RUE strength to 4+/5 in order to  participate in carrying light boxes and other objects during moving process.    Time 6    Period Weeks    Status On-going      OT LONG TERM GOAL #4   Title Patient will decrease RUE fascial restrictions to min amount in order to increase functional mobility needed to complete reaching tasks with minimal pain.    Time 6    Period Weeks    Status On-going      OT LONG TERM GOAL #5   Title Patient will demonstrate independence with functional use of her dominant arm for at least 75% of daily tasks.    Time 6    Period Weeks    Status On-going                 Plan - 04/15/20 1616    Clinical Impression Statement A: Completed soft tissue mobilization to RUE with trigger point palpated on anterior deltoid and mod fasical restrictions on trapezius. Patient tolerated passive stretching WFL, then AA/ROM standing, followed by A/ROM with patient expressing no pain and verbal cues and demonstration required for form. Provided updated HEP for A/ROM.    Body Structure / Function / Physical Skills ADL;IADL;ROM;Strength;Pain;UE functional use;Decreased knowledge of precautions;Fascial restriction    Plan P: Continue manual techniques, passive stretching and standing A/ROM. Attempt proximal shoulder strengthening with wash cloth on door, UBE bike    OT Home Exercise Plan Eval 9/2: Provided AA/ROM exercises, 9/15: Updated HEP to standing A/ROM    Consulted and Agree with Plan of Care Patient           Patient will benefit from skilled therapeutic intervention in order to improve the following deficits and impairments:   Body Structure / Function / Physical Skills: ADL, IADL, ROM, Strength, Pain, UE functional use, Decreased knowledge of precautions, Fascial restriction       Visit Diagnosis: Acute pain of right shoulder  Stiffness of right shoulder, not elsewhere classified  Other symptoms and signs involving the musculoskeletal system    Problem List Patient Active Problem List    Diagnosis Date Noted   S/P reverse total shoulder arthroplasty, right 03/03/2020   Primary osteoarthritis, right shoulder 12/04/2019   Chronic right shoulder pain 11/29/2018   Localized swelling of left lower extremity 07/19/2018   Cellulitis and abscess of left leg 06/15/2018   Unilateral primary osteoarthritis, left knee 05/02/2017   S/P total knee replacement using cement, left 05/02/2017   Primary osteoarthritis of left knee 12/21/2016   Chondromalacia patellae, left knee 05/26/2016   Breast cancer of upper-inner quadrant of right female breast ([place]) 11/04/2015   GERD (gastroesophageal reflux disease) 09/02/2013   Obesity 09/02/2013   Ventral hernia, recurrent 09/02/2013   Unspecified asthma(493.90) 12/12/2012   Cough variant asthma 03/27/2012   Right middle lobe syndrome 03/27/2012     [place], [place] Student [phone]   04/15/2020, 5:20 PM  [facility] [address] [place], [place], [address] Phone: [phone]   Fax:  [phone]  Name: Caitlin [name] MRN: [id number] Date of Birth: 1946-04-18

## 2020-04-17 ENCOUNTER — Encounter (HOSPITAL_COMMUNITY): Payer: Medicare Other

## 2020-04-22 ENCOUNTER — Ambulatory Visit (HOSPITAL_COMMUNITY): Payer: Medicare Other

## 2020-04-22 ENCOUNTER — Encounter (HOSPITAL_COMMUNITY): Payer: Self-pay

## 2020-04-22 ENCOUNTER — Encounter: Payer: Self-pay | Admitting: Physician Assistant

## 2020-04-22 ENCOUNTER — Other Ambulatory Visit: Payer: Self-pay

## 2020-04-22 ENCOUNTER — Ambulatory Visit (INDEPENDENT_AMBULATORY_CARE_PROVIDER_SITE_OTHER): Payer: Medicare Other | Admitting: Physician Assistant

## 2020-04-22 DIAGNOSIS — D229 Melanocytic nevi, unspecified: Secondary | ICD-10-CM

## 2020-04-22 DIAGNOSIS — D485 Neoplasm of uncertain behavior of skin: Secondary | ICD-10-CM | POA: Diagnosis not present

## 2020-04-22 DIAGNOSIS — M25511 Pain in right shoulder: Secondary | ICD-10-CM

## 2020-04-22 DIAGNOSIS — R29898 Other symptoms and signs involving the musculoskeletal system: Secondary | ICD-10-CM

## 2020-04-22 DIAGNOSIS — Z1283 Encounter for screening for malignant neoplasm of skin: Secondary | ICD-10-CM

## 2020-04-22 DIAGNOSIS — M25611 Stiffness of right shoulder, not elsewhere classified: Secondary | ICD-10-CM

## 2020-04-22 DIAGNOSIS — D225 Melanocytic nevi of trunk: Secondary | ICD-10-CM | POA: Diagnosis not present

## 2020-04-22 DIAGNOSIS — D224 Melanocytic nevi of scalp and neck: Secondary | ICD-10-CM | POA: Diagnosis not present

## 2020-04-22 HISTORY — DX: Melanocytic nevi, unspecified: D22.9

## 2020-04-22 NOTE — Therapy (Signed)
[facility] [place], [place], [address] Phone: [phone]   Fax:  [phone]  Occupational Therapy Treatment  Patient Details  Name: Caitlin [name] MRN: [id number] Date of Birth: 1946/04/15 Referring Provider (OT): Meredith Pel, MD   Encounter Date: 04/22/2020   OT End of Session - 04/22/20 1530    Visit Number 5    Number of Visits 12    Date for OT Re-Evaluation 05/14/20    Authorization Type Medicare- Part A, Generic [employer]    Progress Note Due on Visit 10    OT Start Time 1430    OT Stop Time 1510    OT Time Calculation (min) 40 min    Activity Tolerance Patient tolerated treatment well    Behavior During Therapy [facility] for tasks assessed/performed           Past Medical History:  Diagnosis Date  . Arthritis   . Asthma   . Breast cancer ([place])   . Breast disorder    cancer  . Breast mass    benign  . Cellulitis   . GERD (gastroesophageal reflux disease)   . Heart murmur    AS CHILD  OUTGREW IT  . History of hiatal hernia   . Hyperlipidemia   . Hypertension   . Impaired fasting glucose   . Rectocele   . Varicose vein of leg     Past Surgical History:  Procedure Laterality Date  . BREAST LUMPECTOMY WITH RADIOACTIVE SEED AND SENTINEL LYMPH NODE BIOPSY Right 11/11/2015   Procedure: RIGHT BREAST RADIOACTIVE SEED GUIDED  LUMPECTOMY AND RADIOACTIVE SEED TARGETED SENTINEL LYMPH NODE BIOPSY;  Surgeon: Stark Klein, MD;  Location: [facility];  Service: General;  Laterality: Right;  . BREAST SURGERY Left    breast biopies x4  . CHOLECYSTECTOMY  09/08/2010  . COLONOSCOPY    . COLONOSCOPY N/A 09/25/2013   Procedure: COLONOSCOPY;  Surgeon: Rogene Houston, MD;  [facility];  Service: Endoscopy;  Laterality: N/A;  100-moved to 1200 Ann to notify pt  . HERNIA REPAIR  12/24/2010, 12-2014   TOTAL 4  . LAPAROSCOPIC GASTRIC SLEEVE RESECTION  11-2013   at [place]  2007/2008  . REVERSE SHOULDER ARTHROPLASTY Right 03/03/2020   Procedure: RIGHT REVERSE SHOULDER ARTHROPLASTY;  Surgeon: Meredith Pel, MD;  Location: [facility];  Service: Orthopedics;  Laterality: Right;  . TONSILLECTOMY  1968  . TOTAL KNEE ARTHROPLASTY Left 05/02/2017  . TOTAL KNEE ARTHROPLASTY Left 05/02/2017   Procedure: LEFT TOTAL KNEE ARTHROPLASTY;  Surgeon: [name], MD;  Location: [facility];  Service: Orthopedics;  Laterality: Left;  . UPPER GASTROINTESTINAL ENDOSCOPY    . VAGINAL HYSTERECTOMY  1980s    There were no vitals filed for this visit.   Subjective Assessment - 04/22/20 1433    Subjective  S: Sorry I had to cancel my appointment last week    Currently in Pain? Yes    Pain Score 1     Pain Location Shoulder    Pain Orientation Right    Pain Descriptors / Indicators Sore    Pain Type Acute pain              [facility] OT Assessment - 04/22/20 1436      Assessment   Medical Diagnosis Right total shoulder arthroplasty      Precautions   Precautions Shoulder    Type of Shoulder Precautions A/ROM,  no advanced strengthening until week 12 , Dr. recommends no more than 15 pounds                    OT Treatments/Exercises (OP) - 04/22/20 1436      Exercises   Exercises Shoulder      Shoulder Exercises: Supine   Protraction PROM;5 reps    Horizontal ABduction PROM;5 reps    External Rotation PROM;5 reps    Internal Rotation PROM;5 reps    Flexion PROM;5 reps    ABduction PROM;5 reps      Shoulder Exercises: Standing   Protraction AROM;10 reps    Horizontal ABduction AROM;10 reps    External Rotation AROM;10 reps    Internal Rotation AROM;10 reps    Flexion AROM;10 reps    ABduction AROM;10 reps      Shoulder Exercises: ROM/Strengthening   UBE (Upper Arm Bike) Level 1, 1' forward 1' backwards    Over Head Lace 2'    Other ROM/Strengthening Exercises proximal shoulder strengthening using wash cloth on door, 1' flexion 1'  abduction      Manual Therapy   Manual Therapy Soft tissue mobilization    Manual therapy comments completed separately from therapeutic exercises    Soft tissue mobilization Soft tissue mobilization applied to right upper arm, trapezius, and scapularis region to decrease fascial restricitions for increased mobility                  OT Education - 04/22/20 1526    Education Details Discussed A/ROM form and suggested using a towel in between a elbow and torso for er/IR, proximal shoulder strengthening using wash cloth on door, overhead lace    Person(s) Educated Patient    Methods Explanation    Comprehension Verbalized understanding;Returned demonstration;Verbal cues required            OT Short Term Goals - 04/07/20 1535      OT SHORT TERM GOAL #1   Title Patient will increase RUE P/ROM to [facility] in order to increase ability to increase dressing tasks such as donning/doffing bra with less difficulty.    Time 3    Period Weeks    Status On-going    Target Date 04/23/20      OT SHORT TERM GOAL #2   Title Patient will increase RUE strength to 3+/5 in order to complete waist level activities.    Time 3    Period Weeks    Status On-going    Target Date 04/23/20      OT SHORT TERM GOAL #3   Title Patient will decrease RUE fascial restrictions to mod amount in order to increase functional mobility needed to complete reaching tasks at or below shoulder level.    Time 3    Period Weeks    Status On-going    Target Date 04/23/20      OT SHORT TERM GOAL #4   Title Patient will be educated and independent with HEP in order to increase functional use of her RUE during basic ADL tasks.    Time 3    Period Weeks    Status On-going    Target Date 04/23/20             OT Long Term Goals - 04/07/20 1446      OT LONG TERM GOAL #1   Title Patient will improve RUE A/ROM to [facility] in order to complete high level reaching tasks above shoulder level.  Time 6    Period Weeks     Status On-going      OT LONG TERM GOAL #2   Title Patient will report having a pain level of no more than 2/10 while completing daily tasks around the home.    Time 6    Period Weeks    Status On-going      OT LONG TERM GOAL #3   Title Patient will increase RUE strength to 4+/5 in order to participate in carrying light boxes and other objects during moving process.    Time 6    Period Weeks    Status On-going      OT LONG TERM GOAL #4   Title Patient will decrease RUE fascial restrictions to min amount in order to increase functional mobility needed to complete reaching tasks with minimal pain.    Time 6    Period Weeks    Status On-going      OT LONG TERM GOAL #5   Title Patient will demonstrate independence with functional use of her dominant arm for at least 75% of daily tasks.    Time 6    Period Weeks    Status On-going                 Plan - 04/22/20 1531    Clinical Impression Statement A: Completed soft tissue mobilization to RUE with min to mod fascial restrictions palpated on the middle deltoid and trapezius region, followed by passive stretching WFL. Patient completed A/ROM standing, proximal shoulder strengthening using wash cloth on wall and overhead lace with verbal and visual cues required for technique and form. Patient completed UBE bike on low level, reporting no increase in pain.    Body Structure / Function / Physical Skills ADL;IADL;ROM;Strength;Pain;UE functional use;Decreased knowledge of precautions;Fascial restriction    Plan P: Continue manual techniques, passive stretching, wall wash, functional reaching, UBE bike    Consulted and Agree with Plan of Care Patient           Patient will benefit from skilled therapeutic intervention in order to improve the following deficits and impairments:   Body Structure / Function / Physical Skills: ADL, IADL, ROM, Strength, Pain, UE functional use, Decreased knowledge of precautions, Fascial restriction        Visit Diagnosis: Acute pain of right shoulder  Stiffness of right shoulder, not elsewhere classified  Other symptoms and signs involving the musculoskeletal system    Problem List Patient Active Problem List   Diagnosis Date Noted  . S/P reverse total shoulder arthroplasty, right 03/03/2020  . Primary osteoarthritis, right shoulder 12/04/2019  . Chronic right shoulder pain 11/29/2018  . Localized swelling of left lower extremity 07/19/2018  . Cellulitis and abscess of left leg 06/15/2018  . Unilateral primary osteoarthritis, left knee 05/02/2017  . S/P total knee replacement using cement, left 05/02/2017  . Primary osteoarthritis of left knee 12/21/2016  . Chondromalacia patellae, left knee 05/26/2016  . Breast cancer of upper-inner quadrant of right female breast ([place]) 11/04/2015  . GERD (gastroesophageal reflux disease) 09/02/2013  . Obesity 09/02/2013  . Ventral hernia, recurrent 09/02/2013  . Unspecified asthma(493.90) 12/12/2012  . Cough variant asthma 03/27/2012  . Right middle lobe syndrome 03/27/2012    [place], [place] Student [phone]   04/22/2020, 3:44 PM  [facility] [place], [place], [address] Phone: [phone]   Fax:  [phone]  Name: Caitlin [name] MRN: [id number] Date of Birth:  04/14/1946 

## 2020-04-22 NOTE — Progress Notes (Signed)
   Follow up Visit  Subjective  Caitlin [name] is a 74 y.o. female who presents for the following: Annual Exam (no new concerns). Was seen in August for an ulceration on the lower leg that has healed since her last visit.   Objective  Well appearing patient in no apparent distress; mood and affect are within normal limits.  A full examination was performed including scalp, head, eyes, ears, nose, lips, neck, chest, axillae, abdomen, back, buttocks, bilateral upper extremities, bilateral lower extremities, hands, feet, fingers, toes, fingernails, and toenails. All findings within normal limits unless otherwise noted below.   Objective  Left Lower Back: Small irregularly colored macule       Objective  Neck - Posterior: Inflamed papule     Objective  Left Abdomen (side) - Lower: Total body skin exam  Assessment & Plan  Neoplasm of uncertain behavior of skin (2) Left Lower Back  Skin / nail biopsy Type of biopsy: tangential   Informed consent: discussed and consent obtained   Timeout: patient name, date of birth, surgical site, and procedure verified   Procedure prep:  Patient was prepped and draped in usual sterile fashion (Non sterile) Prep type:  Chlorhexidine Anesthesia: the lesion was anesthetized in a standard fashion   Anesthetic:  1% lidocaine w/ epinephrine 1-100,000 local infiltration Instrument used: flexible razor blade   Outcome: patient tolerated procedure well   Post-procedure details: wound care instructions given    Specimen 1 - Surgical pathology Differential Diagnosis: r/ o atypia Check Margins: No  Neck - Posterior  Skin / nail biopsy Type of biopsy: tangential   Informed consent: discussed and consent obtained   Timeout: patient name, date of birth, surgical site, and procedure verified   Procedure prep:  Patient was prepped and draped in usual sterile fashion (Non sterile) Prep type:  Chlorhexidine Anesthesia: the lesion was anesthetized  in a standard fashion   Anesthetic:  1% lidocaine w/ epinephrine 1-100,000 local infiltration Instrument used: flexible razor blade   Outcome: patient tolerated procedure well   Post-procedure details: wound care instructions given    Specimen 2 - Surgical pathology Differential Diagnosis: r/o atypia Check Margins: No  Skin exam for malignant neoplasm Left Abdomen (side) - Lower

## 2020-04-22 NOTE — Patient Instructions (Signed)

## 2020-04-24 ENCOUNTER — Ambulatory Visit (HOSPITAL_COMMUNITY): Payer: Medicare Other

## 2020-04-24 ENCOUNTER — Encounter (HOSPITAL_COMMUNITY): Payer: Self-pay

## 2020-04-24 ENCOUNTER — Other Ambulatory Visit: Payer: Self-pay

## 2020-04-24 DIAGNOSIS — M25611 Stiffness of right shoulder, not elsewhere classified: Secondary | ICD-10-CM

## 2020-04-24 DIAGNOSIS — M25511 Pain in right shoulder: Secondary | ICD-10-CM | POA: Diagnosis not present

## 2020-04-24 DIAGNOSIS — R29898 Other symptoms and signs involving the musculoskeletal system: Secondary | ICD-10-CM | POA: Diagnosis not present

## 2020-04-24 NOTE — Therapy (Signed)
[place]aitlin [name], [place], [address] Phone: [phone]   Fax:  [phone]  Occupational Therapy Treatment  Patient Details  Name: Caitlin [name] MRN: [id number] Date of Birth: October 01, 1945 Referring Provider (OT): Meredith Pel, MD   Encounter Date: 04/24/2020   OT End of Session - 04/24/20 1200    Visit Number 6    Number of Visits 12    Date for OT Re-Evaluation 05/14/20    Authorization Type Medicare- Part A, Generic [employer]    Progress Note Due on Visit 10    OT Start Time 1115    OT Stop Time 1156    OT Time Calculation (min) 41 min    Activity Tolerance Patient tolerated treatment well    Behavior During Therapy [facility] for tasks assessed/performed           Past Medical History:  Diagnosis Date  . Arthritis   . Asthma   . Breast cancer ([place])   . Breast disorder    cancer  . Breast mass    benign  . Cellulitis   . GERD (gastroesophageal reflux disease)   . Heart murmur    AS CHILD  OUTGREW IT  . History of hiatal hernia   . Hyperlipidemia   . Hypertension   . Impaired fasting glucose   . Rectocele   . Varicose vein of leg     Past Surgical History:  Procedure Laterality Date  . BREAST LUMPECTOMY WITH RADIOACTIVE SEED AND SENTINEL LYMPH NODE BIOPSY Right 11/11/2015   Procedure: RIGHT BREAST RADIOACTIVE SEED GUIDED  LUMPECTOMY AND RADIOACTIVE SEED TARGETED SENTINEL LYMPH NODE BIOPSY;  Surgeon: Stark Klein, MD;  Location: [facility];  Service: General;  Laterality: Right;  . BREAST SURGERY Left    breast biopies x4  . CHOLECYSTECTOMY  09/08/2010  . COLONOSCOPY    . COLONOSCOPY N/A 09/25/2013   Procedure: COLONOSCOPY;  Surgeon: Rogene Houston, MD;  [facility];  Service: Endoscopy;  Laterality: N/A;  100-moved to 1200 Ann to notify pt  . HERNIA REPAIR  12/24/2010, 12-2014   TOTAL 4  . LAPAROSCOPIC GASTRIC SLEEVE RESECTION  11-2013   at [place]  2007/2008  . REVERSE SHOULDER ARTHROPLASTY Right 03/03/2020   Procedure: RIGHT REVERSE SHOULDER ARTHROPLASTY;  Surgeon: Meredith Pel, MD;  Location: [facility];  Service: Orthopedics;  Laterality: Right;  . TONSILLECTOMY  1968  . TOTAL KNEE ARTHROPLASTY Left 05/02/2017  . TOTAL KNEE ARTHROPLASTY Left 05/02/2017   Procedure: LEFT TOTAL KNEE ARTHROPLASTY;  Surgeon: [name], MD;  Location: [facility];  Service: Orthopedics;  Laterality: Left;  . UPPER GASTROINTESTINAL ENDOSCOPY    . VAGINAL HYSTERECTOMY  1980s    There were no vitals filed for this visit.   Subjective Assessment - 04/24/20 1117    Subjective  S: Shoulder isn't bothering me today suprisingly    Currently in Pain? No/denies              [facility] OT Assessment - 04/24/20 1128      Assessment   Medical Diagnosis Right total shoulder arthroplasty      Precautions   Precautions Shoulder    Type of Shoulder Precautions A/ROM, no advanced strengthening until week 12 , Dr. recommends no more than 15 pounds                    OT Treatments/Exercises (OP) -  04/24/20 1118      Exercises   Exercises Shoulder      Shoulder Exercises: Supine   Protraction PROM;5 reps    Horizontal ABduction PROM;5 reps    External Rotation PROM;5 reps    Internal Rotation PROM;5 reps    Flexion PROM;5 reps    ABduction PROM;5 reps      Shoulder Exercises: Standing   Protraction AROM;12 reps    Horizontal ABduction AROM;12 reps    External Rotation AROM;12 reps    Internal Rotation AROM;12 reps    Flexion AROM;12 reps    ABduction AROM;12 reps    Extension AROM;10 reps;Theraband    Theraband Level (Shoulder Extension) Level 2 (Red)    Row AROM;10 reps;Theraband    Theraband Level (Shoulder Row) Level 2 (Red)    Retraction AROM;10 reps;Theraband    Theraband Level (Shoulder Retraction) Level 2 (Red)      Shoulder Exercises: ROM/Strengthening   UBE (Upper Arm Bike) Level 1, 2' forward 2' backwards     Wall Wash 1'    Over Head Lace 2'                  OT Education - 04/24/20 1153    Education Details Red theraband for scapular strengthening, discussed importance of following therapist's recommendations and instructions for exercises versus completing exercises that husband or others have suggested    Person(s) Educated Patient    Methods Explanation;Demonstration    Comprehension Verbalized understanding;Returned demonstration;Verbal cues required;Tactile cues required;Need further instruction            OT Short Term Goals - 04/07/20 1535      OT SHORT TERM GOAL #1   Title Patient will increase RUE P/ROM to [facility] in order to increase ability to increase dressing tasks such as donning/doffing bra with less difficulty.    Time 3    Period Weeks    Status On-going    Target Date 04/23/20      OT SHORT TERM GOAL #2   Title Patient will increase RUE strength to 3+/5 in order to complete waist level activities.    Time 3    Period Weeks    Status On-going    Target Date 04/23/20      OT SHORT TERM GOAL #3   Title Patient will decrease RUE fascial restrictions to mod amount in order to increase functional mobility needed to complete reaching tasks at or below shoulder level.    Time 3    Period Weeks    Status On-going    Target Date 04/23/20      OT SHORT TERM GOAL #4   Title Patient will be educated and independent with HEP in order to increase functional use of her RUE during basic ADL tasks.    Time 3    Period Weeks    Status On-going    Target Date 04/23/20             OT Long Term Goals - 04/07/20 1446      OT LONG TERM GOAL #1   Title Patient will improve RUE A/ROM to [facility] in order to complete high level reaching tasks above shoulder level.    Time 6    Period Weeks    Status On-going      OT LONG TERM GOAL #2   Title Patient will report having a pain level of no more than 2/10 while completing daily tasks around the home.    Time 6  Period  Weeks    Status On-going      OT LONG TERM GOAL #3   Title Patient will increase RUE strength to 4+/5 in order to participate in carrying light boxes and other objects during moving process.    Time 6    Period Weeks    Status On-going      OT LONG TERM GOAL #4   Title Patient will decrease RUE fascial restrictions to min amount in order to increase functional mobility needed to complete reaching tasks with minimal pain.    Time 6    Period Weeks    Status On-going      OT LONG TERM GOAL #5   Title Patient will demonstrate independence with functional use of her dominant arm for at least 75% of daily tasks.    Time 6    Period Weeks    Status On-going                 Plan - 04/24/20 1200    Clinical Impression Statement A: Passive stretches completed with P/ROM [facility]. Patient progressing with A/ROM, from 10 to 12 reps. Completed red theraband scapular exercises with patient requiring VC for form and technique.    Body Structure / Function / Physical Skills ADL;IADL;ROM;Strength;Pain;UE functional use;Decreased knowledge of precautions;Fascial restriction    Plan P: Follow up on HEP, proximal shoulder strengthening, attempt therapy ball strengthening, functional reach with clip tower    OT Home Exercise Plan Eval 9/2: Provided AA/ROM exercises, 9/15: Updated HEP to standing A/ROM, 9/24: Red theraband scapular strengthening    Consulted and Agree with Plan of Care Patient           Patient will benefit from skilled therapeutic intervention in order to improve the following deficits and impairments:   Body Structure / Function / Physical Skills: ADL, IADL, ROM, Strength, Pain, UE functional use, Decreased knowledge of precautions, Fascial restriction       Visit Diagnosis: Acute pain of right shoulder  Stiffness of right shoulder, not elsewhere classified  Other symptoms and signs involving the musculoskeletal system    Problem List Patient Active Problem List    Diagnosis Date Noted  . S/P reverse total shoulder arthroplasty, right 03/03/2020  . Primary osteoarthritis, right shoulder 12/04/2019  . Chronic right shoulder pain 11/29/2018  . Localized swelling of left lower extremity 07/19/2018  . Cellulitis and abscess of left leg 06/15/2018  . Unilateral primary osteoarthritis, left knee 05/02/2017  . S/P total knee replacement using cement, left 05/02/2017  . Primary osteoarthritis of left knee 12/21/2016  . Chondromalacia patellae, left knee 05/26/2016  . Breast cancer of upper-inner quadrant of right female breast ([place]) 11/04/2015  . GERD (gastroesophageal reflux disease) 09/02/2013  . Obesity 09/02/2013  . Ventral hernia, recurrent 09/02/2013  . Unspecified asthma(493.90) 12/12/2012  . Cough variant asthma 03/27/2012  . Right middle lobe syndrome 03/27/2012     [place], [place] Student [phone]   04/24/2020, 12:06 PM  [place] [address] [place], [place], [address] Phone: [phone]   Fax:  [phone]  Name: MEGYN LENG MRN: [id number] Date of Birth: 05/18/1946

## 2020-04-24 NOTE — Patient Instructions (Addendum)
Complete at least once a day, 10 times each exercise.   Use only the RED band, NO green at this time per therapist!   1) (Home) Extension: Isometric / Bilateral Arm Retraction - Sitting   Facing anchor, hold hands and elbow at shoulder height, with elbow bent.  Pull arms back to squeeze shoulder blades together.   2) (Clinic) Extension / Flexion (Assist)   Face anchor, pull arms back, keeping elbow straight, and squeze shoulder blades together.  Copyright  VHI. All rights reserved.   3) (Home) Retraction: Row - Bilateral (Anchor)   Facing anchor, arms reaching forward, pull hands toward stomach, keeping elbows bent and at your sides and pinching shoulder blades together.   Copyright  VHI. All rights reserved.

## 2020-04-27 ENCOUNTER — Ambulatory Visit (INDEPENDENT_AMBULATORY_CARE_PROVIDER_SITE_OTHER): Payer: Medicare Other | Admitting: Gastroenterology

## 2020-04-29 ENCOUNTER — Ambulatory Visit (HOSPITAL_COMMUNITY): Payer: Medicare Other

## 2020-04-29 ENCOUNTER — Encounter (HOSPITAL_COMMUNITY): Payer: Self-pay

## 2020-04-29 ENCOUNTER — Other Ambulatory Visit: Payer: Self-pay

## 2020-04-29 DIAGNOSIS — H3562 Retinal hemorrhage, left eye: Secondary | ICD-10-CM | POA: Diagnosis not present

## 2020-04-29 DIAGNOSIS — M25511 Pain in right shoulder: Secondary | ICD-10-CM

## 2020-04-29 DIAGNOSIS — R29898 Other symptoms and signs involving the musculoskeletal system: Secondary | ICD-10-CM | POA: Diagnosis not present

## 2020-04-29 DIAGNOSIS — M25611 Stiffness of right shoulder, not elsewhere classified: Secondary | ICD-10-CM | POA: Diagnosis not present

## 2020-04-29 NOTE — Therapy (Addendum)
[place], [place], [address] Phone: [phone]   Fax:  [phone]  Occupational Therapy Treatment  Patient Details  Name: Caitlin [name] MRN: [id number] Date of Birth: [birth year] Referring Provider (OT): Meredith Pel, MD   Encounter Date: 04/29/2020   OT End of Session - 04/29/20 1609    Visit Number 7    Number of Visits 12    Date for OT Re-Evaluation 05/14/20    Authorization Type Medicare- Part A, Generic [employer]    Progress Note Due on Visit 10    OT Start Time 1304   pt arrived late   OT Stop Time 1340    OT Time Calculation (min) 36 min    Activity Tolerance Patient tolerated treatment well    Behavior During Therapy WFL for tasks assessed/performed           Past Medical History:  Diagnosis Date   Arthritis    Asthma    Breast cancer ([place])    Breast disorder    cancer   Breast mass    benign   Cellulitis    GERD (gastroesophageal reflux disease)    Heart murmur    AS CHILD  OUTGREW IT   History of hiatal hernia    Hyperlipidemia    Hypertension    Impaired fasting glucose    Rectocele    Varicose vein of leg     Past Surgical History:  Procedure Laterality Date   BREAST LUMPECTOMY WITH RADIOACTIVE SEED AND SENTINEL LYMPH NODE BIOPSY Right 11/11/2015   Procedure: RIGHT BREAST RADIOACTIVE SEED GUIDED  LUMPECTOMY AND RADIOACTIVE SEED TARGETED SENTINEL LYMPH NODE BIOPSY;  Surgeon: Stark Klein, MD;  Location: [place] [facility];  Service: General;  Laterality: Right;   BREAST SURGERY Left    breast biopies x4   CHOLECYSTECTOMY  09/08/2010   COLONOSCOPY     COLONOSCOPY N/A 09/25/2013   Procedure: COLONOSCOPY;  Surgeon: Rogene Houston, MD;  [facility];  Service: Endoscopy;  Laterality: N/A;  100-moved to 1200 Ann to notify pt   HERNIA REPAIR  12/24/2010, 12-2014   TOTAL 4   LAPAROSCOPIC GASTRIC SLEEVE RESECTION  11-2013   at [place]  2007/2008   REVERSE SHOULDER ARTHROPLASTY Right 03/03/2020   Procedure: RIGHT REVERSE SHOULDER ARTHROPLASTY;  Surgeon: Meredith Pel, MD;  Location: [facility];  Service: Orthopedics;  Laterality: Right;   TONSILLECTOMY  1968   TOTAL KNEE ARTHROPLASTY Left 05/02/2017   TOTAL KNEE ARTHROPLASTY Left 05/02/2017   Procedure: LEFT TOTAL KNEE ARTHROPLASTY;  Surgeon: [name], MD;  Location: [facility];  Service: Orthopedics;  Laterality: Left;   UPPER GASTROINTESTINAL ENDOSCOPY     VAGINAL HYSTERECTOMY  1980s    There were no vitals filed for this visit.   Subjective Assessment - 04/29/20 1307    Subjective  S: My shoulder is sore enough for me to know its there    Currently in Pain? Yes    Pain Score 1     Pain Location Shoulder    Pain Orientation Right    Pain Descriptors / Indicators Sore              [facility] OT Assessment - 04/29/20 1417      Assessment   Medical Diagnosis Right total shoulder arthroplasty      Precautions   Precautions Shoulder    Type of Shoulder Precautions A/ROM, no  advanced strengthening until week 12 , Dr. recommends no more than 15 pounds                    OT Treatments/Exercises (OP) - 04/29/20 1309      Exercises   Exercises Shoulder      Shoulder Exercises: Supine   Protraction PROM;5 reps    Horizontal ABduction PROM;5 reps    External Rotation PROM;5 reps    Internal Rotation PROM;5 reps    Flexion PROM;5 reps    ABduction PROM;5 reps      Shoulder Exercises: Standing   Protraction AROM;12 reps    Horizontal ABduction AROM;12 reps    External Rotation AROM;12 reps    Internal Rotation AROM;12 reps    Flexion AROM;Right;12 reps;Weights    Shoulder Flexion Weight (lbs) 1    Flexion Limitations To 90   10X   ABduction AROM;12 reps      Shoulder Exercises: ROM/Strengthening   UBE (Upper Arm Bike) Level 2, 1' forward 1' reverse    Proximal Shoulder Strengthening, Supine 12X    Proximal  Shoulder Strengthening, Seated 12X    Other ROM/Strengthening Exercises proximal shoulder strengthening using green ball on wall, 1' flexion 1' abduction                    OT Short Term Goals - 04/07/20 1535      OT SHORT TERM GOAL #1   Title Patient will increase RUE P/ROM to [facility] in order to increase ability to increase dressing tasks such as donning/doffing bra with less difficulty.    Time 3    Period Weeks    Status On-going    Target Date 04/23/20      OT SHORT TERM GOAL #2   Title Patient will increase RUE strength to 3+/5 in order to complete waist level activities.    Time 3    Period Weeks    Status On-going    Target Date 04/23/20      OT SHORT TERM GOAL #3   Title Patient will decrease RUE fascial restrictions to mod amount in order to increase functional mobility needed to complete reaching tasks at or below shoulder level.    Time 3    Period Weeks    Status On-going    Target Date 04/23/20      OT SHORT TERM GOAL #4   Title Patient will be educated and independent with HEP in order to increase functional use of her RUE during basic ADL tasks.    Time 3    Period Weeks    Status On-going    Target Date 04/23/20             OT Long Term Goals - 04/07/20 1446      OT LONG TERM GOAL #1   Title Patient will improve RUE A/ROM to [facility] in order to complete high level reaching tasks above shoulder level.    Time 6    Period Weeks    Status On-going      OT LONG TERM GOAL #2   Title Patient will report having a pain level of no more than 2/10 while completing daily tasks around the home.    Time 6    Period Weeks    Status On-going      OT LONG TERM GOAL #3   Title Patient will increase RUE strength to 4+/5 in order to participate in carrying light boxes and other objects  during moving process.    Time 6    Period Weeks    Status On-going      OT LONG TERM GOAL #4   Title Patient will decrease RUE fascial restrictions to min amount in order  to increase functional mobility needed to complete reaching tasks with minimal pain.    Time 6    Period Weeks    Status On-going      OT LONG TERM GOAL #5   Title Patient will demonstrate independence with functional use of her dominant arm for at least 75% of daily tasks.    Time 6    Period Weeks    Status On-going                 Plan - 04/29/20 1422    Clinical Impression Statement A: Passive stretches completed, followed by proximal shoulder strengthening supine. A/ROM standing completed with verbal cues and demonstration for form provided. Patient progressed to using small green ball for proximal shoulder strengthening using ball on wall, with intermittent rest break required for abduction.    Body Structure / Function / Physical Skills ADL;IADL;ROM;Strength;Pain;UE functional use;Decreased knowledge of precautions;Fascial restriction    Plan P: Functional reaching to high level with squigz, therapy ball strengthening, scapular strengthening with red theraband    Consulted and Agree with Plan of Care Patient           Patient will benefit from skilled therapeutic intervention in order to improve the following deficits and impairments:   Body Structure / Function / Physical Skills: ADL, IADL, ROM, Strength, Pain, UE functional use, Decreased knowledge of precautions, Fascial restriction       Visit Diagnosis: Acute pain of right shoulder  Stiffness of right shoulder, not elsewhere classified  Other symptoms and signs involving the musculoskeletal system    Problem List Patient Active Problem List   Diagnosis Date Noted   S/P reverse total shoulder arthroplasty, right 03/03/2020   Primary osteoarthritis, right shoulder 12/04/2019   Chronic right shoulder pain 11/29/2018   Localized swelling of left lower extremity 07/19/2018   Cellulitis and abscess of left leg 06/15/2018   Unilateral primary osteoarthritis, left knee 05/02/2017   S/P total knee  replacement using cement, left 05/02/2017   Primary osteoarthritis of left knee 12/21/2016   Chondromalacia patellae, left knee 05/26/2016   Breast cancer of upper-inner quadrant of right female breast ([place]) 11/04/2015   GERD (gastroesophageal reflux disease) 09/02/2013   Obesity 09/02/2013   Ventral hernia, recurrent 09/02/2013   Unspecified asthma(493.90) 12/12/2012   Cough variant asthma 03/27/2012   Right middle lobe syndrome 03/27/2012    [place], [place] Student [phone]   04/29/2020, 4:10 PM  [place], [place], [address] Phone: [phone]   Fax:  [phone]  Name: VERTA RIEDLINGER MRN: [id number] Date of Birth: October 21, 1945

## 2020-05-01 ENCOUNTER — Ambulatory Visit (HOSPITAL_COMMUNITY): Payer: Medicare Other | Attending: Orthopedic Surgery | Admitting: Occupational Therapy

## 2020-05-01 ENCOUNTER — Other Ambulatory Visit: Payer: Self-pay

## 2020-05-01 ENCOUNTER — Encounter (HOSPITAL_COMMUNITY): Payer: Self-pay | Admitting: Occupational Therapy

## 2020-05-01 DIAGNOSIS — M25511 Pain in right shoulder: Secondary | ICD-10-CM | POA: Diagnosis not present

## 2020-05-01 DIAGNOSIS — M25611 Stiffness of right shoulder, not elsewhere classified: Secondary | ICD-10-CM | POA: Insufficient documentation

## 2020-05-01 DIAGNOSIS — R29898 Other symptoms and signs involving the musculoskeletal system: Secondary | ICD-10-CM | POA: Insufficient documentation

## 2020-05-01 NOTE — Therapy (Signed)
[place], [place], [address] Phone: [phone]   Fax:  [phone]  Occupational Therapy Treatment  Patient Details  Name: Caitlin [name] MRN: [id number] Date of Birth: 1945/12/14 Referring Provider (OT): Meredith Pel, MD   Encounter Date: 05/01/2020   OT End of Session - 05/01/20 1529    Visit Number 8    Number of Visits 12    Date for OT Re-Evaluation 05/14/20    Authorization Type Medicare- Part A, Generic [employer]    Progress Note Due on Visit 10    OT Start Time 1347    OT Stop Time 1427    OT Time Calculation (min) 40 min    Activity Tolerance Patient tolerated treatment well    Behavior During Therapy [facility] for tasks assessed/performed           Past Medical History:  Diagnosis Date  . Arthritis   . Asthma   . Breast cancer ([place])   . Breast disorder    cancer  . Breast mass    benign  . Cellulitis   . GERD (gastroesophageal reflux disease)   . Heart murmur    AS CHILD  OUTGREW IT  . History of hiatal hernia   . Hyperlipidemia   . Hypertension   . Impaired fasting glucose   . Rectocele   . Varicose vein of leg     Past Surgical History:  Procedure Laterality Date  . BREAST LUMPECTOMY WITH RADIOACTIVE SEED AND SENTINEL LYMPH NODE BIOPSY Right 11/11/2015   Procedure: RIGHT BREAST RADIOACTIVE SEED GUIDED  LUMPECTOMY AND RADIOACTIVE SEED TARGETED SENTINEL LYMPH NODE BIOPSY;  Surgeon: Stark Klein, MD;  Location: [place];  Service: General;  Laterality: Right;  . BREAST SURGERY Left    breast biopies x4  . CHOLECYSTECTOMY  09/08/2010  . COLONOSCOPY    . COLONOSCOPY N/A 09/25/2013   Procedure: COLONOSCOPY;  Surgeon: Rogene Houston, MD;  [facility];  Service: Endoscopy;  Laterality: N/A;  100-moved to 1200 Ann to notify pt  . HERNIA REPAIR  12/24/2010, 12-2014   TOTAL 4  . LAPAROSCOPIC GASTRIC SLEEVE RESECTION  11-2013   at [place]  2007/2008  . REVERSE SHOULDER ARTHROPLASTY Right 03/03/2020   Procedure: RIGHT REVERSE SHOULDER ARTHROPLASTY;  Surgeon: Meredith Pel, MD;  Location: [place];  Service: Orthopedics;  Laterality: Right;  . TONSILLECTOMY  1968  . TOTAL KNEE ARTHROPLASTY Left 05/02/2017  . TOTAL KNEE ARTHROPLASTY Left 05/02/2017   Procedure: LEFT TOTAL KNEE ARTHROPLASTY;  Surgeon: [name], MD;  Location: [place];  Service: Orthopedics;  Laterality: Left;  . UPPER GASTROINTESTINAL ENDOSCOPY    . VAGINAL HYSTERECTOMY  1980s    There were no vitals filed for this visit.   Subjective Assessment - 05/01/20 1348    Subjective  S: I carried a croquet upstairs and it felt pretty good.    Currently in Pain? No/denies              [facility] OT Assessment - 05/01/20 1348      Assessment   Medical Diagnosis Right total shoulder arthroplasty      Precautions   Precautions Shoulder    Type of Shoulder Precautions A/ROM, no advanced strengthening until week 12 , Dr. recommends no more than 15 pounds  OT Treatments/Exercises (OP) - 05/01/20 1349      Exercises   Exercises Shoulder      Shoulder Exercises: Supine   Protraction PROM;5 reps    Horizontal ABduction PROM;5 reps    External Rotation PROM;5 reps    Internal Rotation PROM;5 reps    Flexion PROM;5 reps    ABduction PROM;5 reps      Shoulder Exercises: Standing   Protraction AROM;12 reps    Horizontal ABduction AROM;12 reps    External Rotation AROM;12 reps    Internal Rotation AROM;12 reps    Flexion AROM;12 reps    ABduction AROM;12 reps    Extension Theraband;10 reps    Theraband Level (Shoulder Extension) Level 2 (Red)    Row Theraband;10 reps    Theraband Level (Shoulder Row) Level 2 (Red)    Retraction Theraband;10 reps    Theraband Level (Shoulder Retraction) Level 2 (Red)      Shoulder Exercises: Therapy Ball   Other Therapy Ball Exercises green therapy ball: chest press,  flexion, circles each direction, 10X each      Shoulder Exercises: ROM/Strengthening   UBE (Upper Arm Bike) Level 1 3' forward 3' reverse, pace: 5.0    Over Head Lace 2'    Proximal Shoulder Strengthening, Supine 12X each, no rest breaks    Proximal Shoulder Strengthening, Seated 12X each, no rest breaks    Ball on Wall 1' flexion 1' abduction      Functional Reaching Activities   High Level Pt placing and removing squigz from top of door. Working on high level forward flexion                    OT Short Term Goals - 04/07/20 1535      OT SHORT TERM GOAL #1   Title Patient will increase RUE P/ROM to [facility] in order to increase ability to increase dressing tasks such as donning/doffing bra with less difficulty.    Time 3    Period Weeks    Status On-going    Target Date 04/23/20      OT SHORT TERM GOAL #2   Title Patient will increase RUE strength to 3+/5 in order to complete waist level activities.    Time 3    Period Weeks    Status On-going    Target Date 04/23/20      OT SHORT TERM GOAL #3   Title Patient will decrease RUE fascial restrictions to mod amount in order to increase functional mobility needed to complete reaching tasks at or below shoulder level.    Time 3    Period Weeks    Status On-going    Target Date 04/23/20      OT SHORT TERM GOAL #4   Title Patient will be educated and independent with HEP in order to increase functional use of her RUE during basic ADL tasks.    Time 3    Period Weeks    Status On-going    Target Date 04/23/20             OT Long Term Goals - 04/07/20 1446      OT LONG TERM GOAL #1   Title Patient will improve RUE A/ROM to [facility] in order to complete high level reaching tasks above shoulder level.    Time 6    Period Weeks    Status On-going      OT LONG TERM GOAL #2   Title Patient will report having  a pain level of no more than 2/10 while completing daily tasks around the home.    Time 6    Period Weeks     Status On-going      OT LONG TERM GOAL #3   Title Patient will increase RUE strength to 4+/5 in order to participate in carrying light boxes and other objects during moving process.    Time 6    Period Weeks    Status On-going      OT LONG TERM GOAL #4   Title Patient will decrease RUE fascial restrictions to min amount in order to increase functional mobility needed to complete reaching tasks with minimal pain.    Time 6    Period Weeks    Status On-going      OT LONG TERM GOAL #5   Title Patient will demonstrate independence with functional use of her dominant arm for at least 75% of daily tasks.    Time 6    Period Weeks    Status On-going                 Plan - 05/01/20 1529    Clinical Impression Statement A: Continued with passive stretching and A/ROM exercises, also completing proximal shoulder strengthening today. Pt completing high level functional reaching task with squigz and beginning shoulder strengthening with green therapy ball tasks. Resumed scapular theraband exercises this session as well. Pt with ROM WFL during all tasks, occasional fatigue. Verbal cuing for form and technique.    Body Structure / Function / Physical Skills ADL;IADL;ROM;Strength;Pain;UE functional use;Decreased knowledge of precautions;Fascial restriction    Plan P: Continue with functional reaching adding 1# weight to tasks, continue with therapy ball strengthening    OT Home Exercise Plan Eval 9/2: Provided AA/ROM exercises, 9/15: Updated HEP to standing A/ROM, 9/24: Red theraband scapular strengthening    Consulted and Agree with Plan of Care Patient           Patient will benefit from skilled therapeutic intervention in order to improve the following deficits and impairments:   Body Structure / Function / Physical Skills: ADL, IADL, ROM, Strength, Pain, UE functional use, Decreased knowledge of precautions, Fascial restriction       Visit Diagnosis: Acute pain of right  shoulder  Stiffness of right shoulder, not elsewhere classified  Other symptoms and signs involving the musculoskeletal system    Problem List Patient Active Problem List   Diagnosis Date Noted  . S/P reverse total shoulder arthroplasty, right 03/03/2020  . Primary osteoarthritis, right shoulder 12/04/2019  . Chronic right shoulder pain 11/29/2018  . Localized swelling of left lower extremity 07/19/2018  . Cellulitis and abscess of left leg 06/15/2018  . Unilateral primary osteoarthritis, left knee 05/02/2017  . S/P total knee replacement using cement, left 05/02/2017  . Primary osteoarthritis of left knee 12/21/2016  . Chondromalacia patellae, left knee 05/26/2016  . Breast cancer of upper-inner quadrant of right female breast ([place]) 11/04/2015  . GERD (gastroesophageal reflux disease) 09/02/2013  . Obesity 09/02/2013  . Ventral hernia, recurrent 09/02/2013  . Unspecified asthma(493.90) 12/12/2012  . Cough variant asthma 03/27/2012  . Right middle lobe syndrome 03/27/2012   Guadelupe Sabin, OTR/L  [phone] 05/01/2020, 3:31 PM  [place], [place], [address] Phone: [phone]   Fax:  [phone]  Name: PRESLY STEINRUCK MRN: [id number] Date of Birth: [birth year]

## 2020-05-04 ENCOUNTER — Telehealth: Payer: Self-pay | Admitting: *Deleted

## 2020-05-04 ENCOUNTER — Ambulatory Visit (HOSPITAL_COMMUNITY): Payer: Medicare Other

## 2020-05-04 ENCOUNTER — Encounter (HOSPITAL_COMMUNITY): Payer: Self-pay

## 2020-05-04 ENCOUNTER — Encounter: Payer: Self-pay | Admitting: *Deleted

## 2020-05-04 ENCOUNTER — Other Ambulatory Visit: Payer: Self-pay

## 2020-05-04 DIAGNOSIS — R29898 Other symptoms and signs involving the musculoskeletal system: Secondary | ICD-10-CM

## 2020-05-04 DIAGNOSIS — M25511 Pain in right shoulder: Secondary | ICD-10-CM

## 2020-05-04 DIAGNOSIS — M25611 Stiffness of right shoulder, not elsewhere classified: Secondary | ICD-10-CM | POA: Diagnosis not present

## 2020-05-04 NOTE — Therapy (Signed)
[place], [place], [address] Phone: [phone]   Fax:  [phone]  Occupational Therapy Treatment  Patient Details  Name: Caitlin [name] MRN: [id number] Date of Birth: Apr 21, 1946 Referring Provider (OT): Meredith Pel, MD   Encounter Date: 05/04/2020   OT End of Session - 05/04/20 1420    Visit Number 9    Number of Visits 12    Date for OT Re-Evaluation 05/14/20    Authorization Type Medicare- Part A, Generic [employer]    Progress Note Due on Visit 10    OT Start Time 1348    OT Stop Time 1427    OT Time Calculation (min) 39 min    Activity Tolerance Patient tolerated treatment well    Behavior During Therapy [facility] for tasks assessed/performed           Past Medical History:  Diagnosis Date  . Arthritis   . Asthma   . Atypical mole 04/22/2020   mod-left lower back  . Breast cancer ([place])   . Breast disorder    cancer  . Breast mass    benign  . Cellulitis   . GERD (gastroesophageal reflux disease)   . Heart murmur    AS CHILD  OUTGREW IT  . History of hiatal hernia   . Hyperlipidemia   . Hypertension   . Impaired fasting glucose   . Rectocele   . Varicose vein of leg     Past Surgical History:  Procedure Laterality Date  . BREAST LUMPECTOMY WITH RADIOACTIVE SEED AND SENTINEL LYMPH NODE BIOPSY Right 11/11/2015   Procedure: RIGHT BREAST RADIOACTIVE SEED GUIDED  LUMPECTOMY AND RADIOACTIVE SEED TARGETED SENTINEL LYMPH NODE BIOPSY;  Surgeon: Stark Klein, MD;  Location: [place];  Service: General;  Laterality: Right;  . BREAST SURGERY Left    breast biopies x4  . CHOLECYSTECTOMY  09/08/2010  . COLONOSCOPY    . COLONOSCOPY N/A 09/25/2013   Procedure: COLONOSCOPY;  Surgeon: Rogene Houston, MD;  [facility];  Service: Endoscopy;  Laterality: N/A;  100-moved to 1200 Ann to notify pt  . HERNIA REPAIR  12/24/2010, 12-2014   TOTAL 4  . LAPAROSCOPIC GASTRIC SLEEVE RESECTION   11-2013   at [place]  2007/2008  . REVERSE SHOULDER ARTHROPLASTY Right 03/03/2020   Procedure: RIGHT REVERSE SHOULDER ARTHROPLASTY;  Surgeon: Meredith Pel, MD;  Location: [place];  Service: Orthopedics;  Laterality: Right;  . TONSILLECTOMY  1968  . TOTAL KNEE ARTHROPLASTY Left 05/02/2017  . TOTAL KNEE ARTHROPLASTY Left 05/02/2017   Procedure: LEFT TOTAL KNEE ARTHROPLASTY;  Surgeon: [name], MD;  Location: [place];  Service: Orthopedics;  Laterality: Left;  . UPPER GASTROINTESTINAL ENDOSCOPY    . VAGINAL HYSTERECTOMY  1980s    There were no vitals filed for this visit.   Subjective Assessment - 05/04/20 1350    Subjective  S: I have been sore for 2 days after the other OT massaged me    Currently in Pain? No/denies              [facility] OT Assessment - 05/04/20 1442      Assessment   Medical Diagnosis Right total shoulder arthroplasty      Precautions   Precautions Shoulder    Type of Shoulder Precautions A/ROM, no advanced strengthening until week 12 , Dr. recommends no more than 15 pounds  OT Treatments/Exercises (OP) - 05/04/20 1351      Exercises   Exercises Shoulder      Shoulder Exercises: Supine   Protraction PROM;5 reps    Horizontal ABduction PROM;5 reps    External Rotation PROM;5 reps    Internal Rotation PROM;5 reps    Flexion PROM;5 reps    ABduction PROM;5 reps      Shoulder Exercises: Standing   Protraction AROM;12 reps    Horizontal ABduction AROM;12 reps    External Rotation AROM;12 reps    Internal Rotation AROM;12 reps    Flexion AROM;12 reps    ABduction AROM;12 reps    Extension Theraband;10 reps    Theraband Level (Shoulder Extension) Level 2 (Red)    Row Theraband;10 reps    Theraband Level (Shoulder Row) Level 2 (Red)    Retraction Theraband;10 reps    Theraband Level (Shoulder Retraction) Level 2 (Red)      Shoulder Exercises: Therapy Ball   Other Therapy  Ball Exercises green therapy ball: chest press, flexion, circles each direction, 10X each      Shoulder Exercises: ROM/Strengthening   UBE (Upper Arm Bike) Level 1 1' forward, 1' reverse, Pace: 4.5    Other ROM/Strengthening Exercises proximal shoulder strengthening using green ball on wall, 1' flexion 1' abduction      Functional Reaching Activities   Low Level Reaching to place 21 clips on vertical tower and removing while seated at table level    Mid Level Reaching to place 21 clips on vertical tower and removing while seated at table level    High Level Reaching to place 21 clips on vertical tower and removing while seated at table level                  OT Education - 05/04/20 1436    Education Details Discussed why patient might be sore from manual techniques provided at appt Friday    Person(s) Educated Patient    Methods Explanation    Comprehension Verbalized understanding            OT Short Term Goals - 04/07/20 1535      OT SHORT TERM GOAL #1   Title Patient will increase RUE P/ROM to [facility] in order to increase ability to increase dressing tasks such as donning/doffing bra with less difficulty.    Time 3    Period Weeks    Status On-going    Target Date 04/23/20      OT SHORT TERM GOAL #2   Title Patient will increase RUE strength to 3+/5 in order to complete waist level activities.    Time 3    Period Weeks    Status On-going    Target Date 04/23/20      OT SHORT TERM GOAL #3   Title Patient will decrease RUE fascial restrictions to mod amount in order to increase functional mobility needed to complete reaching tasks at or below shoulder level.    Time 3    Period Weeks    Status On-going    Target Date 04/23/20      OT SHORT TERM GOAL #4   Title Patient will be educated and independent with HEP in order to increase functional use of her RUE during basic ADL tasks.    Time 3    Period Weeks    Status On-going    Target Date 04/23/20              OT Long Term  Goals - 04/07/20 1446      OT LONG TERM GOAL #1   Title Patient will improve RUE A/ROM to [facility] in order to complete high level reaching tasks above shoulder level.    Time 6    Period Weeks    Status On-going      OT LONG TERM GOAL #2   Title Patient will report having a pain level of no more than 2/10 while completing daily tasks around the home.    Time 6    Period Weeks    Status On-going      OT LONG TERM GOAL #3   Title Patient will increase RUE strength to 4+/5 in order to participate in carrying light boxes and other objects during moving process.    Time 6    Period Weeks    Status On-going      OT LONG TERM GOAL #4   Title Patient will decrease RUE fascial restrictions to min amount in order to increase functional mobility needed to complete reaching tasks with minimal pain.    Time 6    Period Weeks    Status On-going      OT LONG TERM GOAL #5   Title Patient will demonstrate independence with functional use of her dominant arm for at least 75% of daily tasks.    Time 6    Period Weeks    Status On-going                 Plan - 05/04/20 1434    Clinical Impression Statement A: Completed passive stretching and and A/ROM exercises, as well as therapy ball and proximal shoulder strengthening. Patient completed high-level reaching task with clip tower, by placing 21 clips vertically. Verbal cueing for form and technique required.    Body Structure / Function / Physical Skills ADL;IADL;ROM;Strength;Pain;UE functional use;Decreased knowledge of precautions;Fascial restriction    Plan P: Progress note and prepare for discharge if applicable, continue with functional reaching adding 1# weight to tasks, continue therapy ball strengthening    Consulted and Agree with Plan of Care Patient           Patient will benefit from skilled therapeutic intervention in order to improve the following deficits and impairments:   Body Structure / Function /  Physical Skills: ADL, IADL, ROM, Strength, Pain, UE functional use, Decreased knowledge of precautions, Fascial restriction       Visit Diagnosis: Acute pain of right shoulder  Stiffness of right shoulder, not elsewhere classified  Other symptoms and signs involving the musculoskeletal system    Problem List Patient Active Problem List   Diagnosis Date Noted  . S/P reverse total shoulder arthroplasty, right 03/03/2020  . Primary osteoarthritis, right shoulder 12/04/2019  . Chronic right shoulder pain 11/29/2018  . Localized swelling of left lower extremity 07/19/2018  . Cellulitis and abscess of left leg 06/15/2018  . Unilateral primary osteoarthritis, left knee 05/02/2017  . S/P total knee replacement using cement, left 05/02/2017  . Primary osteoarthritis of left knee 12/21/2016  . Chondromalacia patellae, left knee 05/26/2016  . Breast cancer of upper-inner quadrant of right female breast ([place]) 11/04/2015  . GERD (gastroesophageal reflux disease) 09/02/2013  . Obesity 09/02/2013  . Ventral hernia, recurrent 09/02/2013  . Unspecified asthma(493.90) 12/12/2012  . Cough variant asthma 03/27/2012  . Right middle lobe syndrome 03/27/2012     [place], [place] [phone]   05/04/2020, 2:45 PM  [facility] [place] [address]  [place], [place], [address] Phone: [phone]   Fax:  [phone]  Name: SUZY KUGEL MRN: [id number] Date of Birth: [birth year]

## 2020-05-04 NOTE — Telephone Encounter (Signed)
-----   Message from Arlyss Gandy, PA-C sent at 05/04/2020  8:34 AM EDT ----- Moderate recheck 3 months

## 2020-05-04 NOTE — Telephone Encounter (Signed)
Pathology to patient, 3 month follow up scheduled.

## 2020-05-06 ENCOUNTER — Ambulatory Visit (HOSPITAL_COMMUNITY): Payer: Medicare Other

## 2020-05-06 ENCOUNTER — Other Ambulatory Visit: Payer: Self-pay

## 2020-05-06 ENCOUNTER — Encounter (HOSPITAL_COMMUNITY): Payer: Self-pay

## 2020-05-06 DIAGNOSIS — M25511 Pain in right shoulder: Secondary | ICD-10-CM

## 2020-05-06 DIAGNOSIS — M25611 Stiffness of right shoulder, not elsewhere classified: Secondary | ICD-10-CM | POA: Diagnosis not present

## 2020-05-06 DIAGNOSIS — R29898 Other symptoms and signs involving the musculoskeletal system: Secondary | ICD-10-CM

## 2020-05-06 NOTE — Therapy (Signed)
[place], [place], [address] Phone: [phone]   Fax:  [phone]  Occupational Therapy Treatment Reassessment/discharge Patient Details  Name: Caitlin [name] MRN: [id number] Date of Birth: 12-10-1945 Referring Provider (OT): Meredith Pel, MD   Encounter Date: 05/06/2020   OT End of Session - 05/06/20 1538    Visit Number 10    Number of Visits 12    Authorization Type Medicare- Part A, Generic [employer]    Progress Note Due on Visit 10    OT Start Time 9892   reassess/discharge   OT Stop Time 1427    OT Time Calculation (min) 38 min    Activity Tolerance Patient tolerated treatment well    Behavior During Therapy [facility] for tasks assessed/performed           Past Medical History:  Diagnosis Date  . Arthritis   . Asthma   . Atypical mole 04/22/2020   mod-left lower back  . Breast cancer ([place])   . Breast disorder    cancer  . Breast mass    benign  . Cellulitis   . GERD (gastroesophageal reflux disease)   . Heart murmur    AS CHILD  OUTGREW IT  . History of hiatal hernia   . Hyperlipidemia   . Hypertension   . Impaired fasting glucose   . Rectocele   . Varicose vein of leg     Past Surgical History:  Procedure Laterality Date  . BREAST LUMPECTOMY WITH RADIOACTIVE SEED AND SENTINEL LYMPH NODE BIOPSY Right 11/11/2015   Procedure: RIGHT BREAST RADIOACTIVE SEED GUIDED  LUMPECTOMY AND RADIOACTIVE SEED TARGETED SENTINEL LYMPH NODE BIOPSY;  Surgeon: Stark Klein, MD;  Location: [place];  Service: General;  Laterality: Right;  . BREAST SURGERY Left    breast biopies x4  . CHOLECYSTECTOMY  09/08/2010  . COLONOSCOPY    . COLONOSCOPY N/A 09/25/2013   Procedure: COLONOSCOPY;  Surgeon: Rogene Houston, MD;  [facility];  Service: Endoscopy;  Laterality: N/A;  100-moved to 1200 Ann to notify pt  . HERNIA REPAIR  12/24/2010, 12-2014   TOTAL 4  . LAPAROSCOPIC GASTRIC SLEEVE  RESECTION  11-2013   at [place]  2007/2008  . REVERSE SHOULDER ARTHROPLASTY Right 03/03/2020   Procedure: RIGHT REVERSE SHOULDER ARTHROPLASTY;  Surgeon: Meredith Pel, MD;  Location: [place];  Service: Orthopedics;  Laterality: Right;  . TONSILLECTOMY  1968  . TOTAL KNEE ARTHROPLASTY Left 05/02/2017  . TOTAL KNEE ARTHROPLASTY Left 05/02/2017   Procedure: LEFT TOTAL KNEE ARTHROPLASTY;  Surgeon: [name], MD;  Location: [place];  Service: Orthopedics;  Laterality: Left;  . UPPER GASTROINTESTINAL ENDOSCOPY    . VAGINAL HYSTERECTOMY  1980s    There were no vitals filed for this visit.   Subjective Assessment - 05/06/20 1607    Subjective  S: Everything has improved since I started therapy.    Currently in Pain? No/denies              [facility] OT Assessment - 05/06/20 1353      Assessment   Medical Diagnosis Right total shoulder arthroplasty      Precautions   Precautions Shoulder    Type of Shoulder Precautions A/ROM, no advanced strengthening until week 12 , Dr. recommends no more than 15 pounds      Observation/Other Assessments   Focus on Therapeutic Outcomes (FOTO)  66/100  ROM / Strength   AROM / PROM / Strength AROM;PROM;Strength      Palpation   Palpation comment trace fascial restrictions in the right upper arm, trapezius, and scapularis region.       AROM   Overall AROM Comments Assessed seated. IR/er adducted    AROM Assessment Site Shoulder    Right/Left Shoulder Right    Right Shoulder Flexion 120 Degrees   previous: same   Right Shoulder ABduction 113 Degrees   previous: 60   Right Shoulder Internal Rotation 90 Degrees   previous: same   Right Shoulder External Rotation 71 Degrees   previous: 20     PROM   Overall PROM Comments Assessed supine. IR/er adducted    PROM Assessment Site Shoulder    Right/Left Shoulder Right    Right Shoulder Flexion 137 Degrees   previous: 120   Right Shoulder ABduction 180  Degrees   preivous:120   Right Shoulder Internal Rotation 90 Degrees   previous: same   Right Shoulder External Rotation 65 Degrees   previous: 39     Strength   Overall Strength Comments Assessed seated. IR/er adducted    Strength Assessment Site Shoulder    Right/Left Shoulder Right    Right Shoulder Flexion 4+/5   previous: 3-/5   Right Shoulder ABduction 4+/5   previous: 3-/5   Right Shoulder Internal Rotation 5/5   previous: 3/5   Right Shoulder External Rotation 4-/5   previous: 3-/5                   OT Treatments/Exercises (OP) - 05/06/20 1608      Exercises   Exercises Shoulder      Shoulder Exercises: Standing   External Rotation Theraband;10 reps   towel roll   Theraband Level (Shoulder External Rotation) Level 2 (Red)                  OT Education - 05/06/20 1608    Education Details reviewed goals and progress in therapy. Provided patient with external rotation strengthening using red theraband.    Person(s) Educated Patient    Methods Explanation;Handout;Demonstration    Comprehension Verbalized understanding;Returned demonstration            OT Short Term Goals - 05/06/20 1406      OT SHORT TERM GOAL #1   Title Patient will increase RUE P/ROM to [facility] in order to increase ability to increase dressing tasks such as donning/doffing bra with less difficulty.    Time 3    Period Weeks    Status Achieved    Target Date 04/23/20      OT SHORT TERM GOAL #2   Title Patient will increase RUE strength to 3+/5 in order to complete waist level activities.    Time 3    Period Weeks    Status Achieved    Target Date 04/23/20      OT SHORT TERM GOAL #3   Title Patient will decrease RUE fascial restrictions to mod amount in order to increase functional mobility needed to complete reaching tasks at or below shoulder level.    Time 3    Period Weeks    Status Achieved    Target Date 04/23/20      OT SHORT TERM GOAL #4   Title Patient will be  educated and independent with HEP in order to increase functional use of her RUE during basic ADL tasks.    Time 3  Period Weeks    Status Achieved    Target Date 04/23/20             OT Long Term Goals - 05/06/20 1409      OT LONG TERM GOAL #1   Title Patient will improve RUE A/ROM to [facility] in order to complete high level reaching tasks above shoulder level.    Time 6    Period Weeks    Status Achieved      OT LONG TERM GOAL #2   Title Patient will report having a pain level of no more than 2/10 while completing daily tasks around the home.    Time 6    Period Weeks    Status Achieved      OT LONG TERM GOAL #3   Title Patient will increase RUE strength to 4+/5 in order to participate in carrying light boxes and other objects during moving process.    Time 6    Period Weeks    Status Partially Met      OT LONG TERM GOAL #4   Title Patient will decrease RUE fascial restrictions to min amount in order to increase functional mobility needed to complete reaching tasks with minimal pain.    Time 6    Period Weeks    Status Achieved      OT LONG TERM GOAL #5   Title Patient will demonstrate independence with functional use of her dominant arm for at least 75% of daily tasks.    Time 6    Period Weeks    Status Achieved                 Plan - 05/06/20 1609    Clinical Impression Statement A: Reassessment completed this date. patient has met all STGs and 4/5 LTGs with an additional goal partially met. patient reports that she is using her RUE for all daily tasks. She reports her strength isn't where she'd like it to be although is able to continue working on strengthening independently at home with a HEP. Pt demonstrates functional ROM and strength. External rotation does have some weakness and HEP was updated to address.    Body Structure / Function / Physical Skills ADL;IADL;ROM;Strength;Pain;UE functional use;Decreased knowledge of precautions;Fascial restriction     Plan P: D/C from OT services with HEP.    OT Home Exercise Plan Eval 9/2: Provided AA/ROM exercises, 9/15: Updated HEP to standing A/ROM, 9/24: Red theraband scapular strengthening 10/6: external rotation strengthening with red band.    Consulted and Agree with Plan of Care Patient           Patient will benefit from skilled therapeutic intervention in order to improve the following deficits and impairments:   Body Structure / Function / Physical Skills: ADL, IADL, ROM, Strength, Pain, UE functional use, Decreased knowledge of precautions, Fascial restriction       Visit Diagnosis: Stiffness of right shoulder, not elsewhere classified  Acute pain of right shoulder  Other symptoms and signs involving the musculoskeletal system    Problem List Patient Active Problem List   Diagnosis Date Noted  . S/P reverse total shoulder arthroplasty, right 03/03/2020  . Primary osteoarthritis, right shoulder 12/04/2019  . Chronic right shoulder pain 11/29/2018  . Localized swelling of left lower extremity 07/19/2018  . Cellulitis and abscess of left leg 06/15/2018  . Unilateral primary osteoarthritis, left knee 05/02/2017  . S/P total knee replacement using cement, left 05/02/2017  . Primary osteoarthritis of left  knee 12/21/2016  . Chondromalacia patellae, left knee 05/26/2016  . Breast cancer of upper-inner quadrant of right female breast ([place]) 11/04/2015  . GERD (gastroesophageal reflux disease) 09/02/2013  . Obesity 09/02/2013  . Ventral hernia, recurrent 09/02/2013  . Unspecified asthma(493.90) 12/12/2012  . Cough variant asthma 03/27/2012  . Right middle lobe syndrome 03/27/2012   Ailene Ravel, OTR/L,CBIS  [phone]  05/06/2020, 4:26 PM  [place] [address] [place], [place], [address] Phone: [phone]   Fax:  [phone]  Name: Caitlin [name] MRN: [id number] Date of Birth: 07/06/1946

## 2020-05-06 NOTE — Patient Instructions (Addendum)
ELASTIC BAND SHOULDER EXTERNAL ROTATION - ER  While holding an elastic band at your side with your elbow bent, start with your hand near your stomach and then pull the band away. Keep your elbow at your side the entire time.  Complete 10 repetitions. Complete 1 set. Once a day using red band.

## 2020-05-14 DIAGNOSIS — Z1389 Encounter for screening for other disorder: Secondary | ICD-10-CM | POA: Diagnosis not present

## 2020-05-14 DIAGNOSIS — R7309 Other abnormal glucose: Secondary | ICD-10-CM | POA: Diagnosis not present

## 2020-05-14 DIAGNOSIS — Z1331 Encounter for screening for depression: Secondary | ICD-10-CM | POA: Diagnosis not present

## 2020-05-14 DIAGNOSIS — E7849 Other hyperlipidemia: Secondary | ICD-10-CM | POA: Diagnosis not present

## 2020-05-14 DIAGNOSIS — Z6837 Body mass index (BMI) 37.0-37.9, adult: Secondary | ICD-10-CM | POA: Diagnosis not present

## 2020-05-14 DIAGNOSIS — Z0001 Encounter for general adult medical examination with abnormal findings: Secondary | ICD-10-CM | POA: Diagnosis not present

## 2020-05-14 DIAGNOSIS — Z23 Encounter for immunization: Secondary | ICD-10-CM | POA: Diagnosis not present

## 2020-05-14 DIAGNOSIS — I10 Essential (primary) hypertension: Secondary | ICD-10-CM | POA: Diagnosis not present

## 2020-05-25 DIAGNOSIS — Z23 Encounter for immunization: Secondary | ICD-10-CM | POA: Diagnosis not present

## 2020-05-30 DIAGNOSIS — E7849 Other hyperlipidemia: Secondary | ICD-10-CM | POA: Diagnosis not present

## 2020-05-30 DIAGNOSIS — I10 Essential (primary) hypertension: Secondary | ICD-10-CM | POA: Diagnosis not present

## 2020-06-10 ENCOUNTER — Telehealth: Payer: Self-pay | Admitting: Internal Medicine

## 2020-06-10 MED ORDER — AZITHROMYCIN 250 MG PO TABS
ORAL_TABLET | ORAL | 0 refills | Status: DC
Start: 2020-06-10 — End: 2020-11-18

## 2020-06-10 MED ORDER — PREDNISONE 10 MG PO TABS
ORAL_TABLET | ORAL | 0 refills | Status: AC
Start: 2020-06-10 — End: 2020-06-16

## 2020-06-10 MED ORDER — BREZTRI AEROSPHERE 160-9-4.8 MCG/ACT IN AERO: 1.0000 | INHALATION_SPRAY | Freq: Two times a day (BID) | RESPIRATORY_TRACT | 11 refills | Status: DC

## 2020-06-10 NOTE — Telephone Encounter (Signed)
Would change the cough meds to mucinex dm 1200 mg every 12 hours as needed  zpak  Prednisone 10 mg take  4 each am x 2 days,   2 each am x 2 days,  1 each am x 2 days and stop    Ov with all meds in hand w/in 6 weeks

## 2020-06-10 NOTE — Telephone Encounter (Signed)
If she can inhale it effectively, then breztri Take 1-2 puffs first thing in am and then another 1-2 puffs about 12 hours later best option

## 2020-06-10 NOTE — Telephone Encounter (Signed)
Spoke with the pt and notified of response per Dr Melvyn Novas  She verbalized understanding  Rx was sent

## 2020-06-10 NOTE — Telephone Encounter (Signed)
Called spoke to patient, shared recommendations per Dr. Melvyn Novas, patient voiced understanding. RX sent to preferred pharmacy.  Patient also asking about inhalers wanting to know since Judithann Sauger is least expensive does Dr. Melvyn Novas want her on the [facility] or to stay on the Trelegy 200 which she is currently taking.  Dr. Melvyn Novas  Please advise.

## 2020-06-10 NOTE — Telephone Encounter (Signed)
ATC left message for patient to call office back for recommnedations

## 2020-06-10 NOTE — Telephone Encounter (Signed)
Called and spoke with pt who has had complaints of cough x4-5 days which is mostly happening in the mornings. Pt states she has been coughing up phlegm which does have some green tinge to it. Pt also has complaints of wheezing.  Pt is taking mucinex, states that she has been using her Trelegy Ellipta inhaler only when needed, and she does not have a rescue inhaler.  Pt has also been taking either Delsym or Robitussin DM at night.  Pt denies any complaints of fever.  Pt said that she has had all 3 covid vaccines and also has had her flu shot.  Pt wants to know what could be recommended to help with her symptoms.  Dr. Melvyn Novas, please advise.  LOV with Wyn Quaker, NP 12/31/19. Phone encounter from 12/31/19 due to inhalers was when pt was switched from [place] which was prescribed to Trelegy. Patient Instructions by [name], NP at 12/31/2019 11:00 AM Author: [name], NP Author Type: Nurse Practitioner Filed: 12/31/2019 11:23 AM  Note Status: Addendum Cosign: Cosign Not Required Encounter Date: 12/31/2019  Editor: [name], NP (Nurse Practitioner)      Prior Versions: 1. [name], NP (Nurse Practitioner) at 12/31/2019 11:23 AM - Addendum   2. [name], NP (Nurse Practitioner) at 12/31/2019 11:22 AM - Signed      You were seen today by [name], NP  for:   1. Cough variant asthma - Budeson-Glycopyrrol-Formoterol (BREZTRI AEROSPHERE) 160-9-4.8 MCG/ACT AERO; Inhale 2 puffs into the lungs 2 (two) times daily.  Dispense: 5.9 g; Refill: 6 - predniSONE (DELTASONE) 10 MG tablet; Take 2 tablets (20mg  total) daily for the next 5 days. Take in the AM with food.  Dispense: 10 tablet; Refill: 0   We recommend today:        Meds ordered this encounter  Medications  . Budeson-Glycopyrrol-Formoterol (BREZTRI AEROSPHERE) 160-9-4.8 MCG/ACT AERO    Sig: Inhale 2 puffs into the lungs 2 (two) times daily.    Dispense:  5.9 g    Refill:  6    Order Specific Question:   Lot  Number?    Answer:   [id number]    Order Specific Question:   Expiration Date?    Answer:   12/29/2020    Order Specific Question:   Manufacturer?    Answer:   [employer] [[id number]]    Order Specific Question:   Quantity    Answer:   1  . predniSONE (DELTASONE) 10 MG tablet    Sig: Take 2 tablets (20mg  total) daily for the next 5 days. Take in the AM with food.    Dispense:  10 tablet    Refill:  0    Follow Up:    Return in about 6 weeks (around 02/11/2020), or if symptoms worsen or fail to improve, for [facility], Follow up with Dr. Melvyn Novas.

## 2020-06-29 ENCOUNTER — Other Ambulatory Visit (HOSPITAL_COMMUNITY): Payer: Self-pay | Admitting: Surgery

## 2020-06-29 DIAGNOSIS — C50211 Malignant neoplasm of upper-inner quadrant of right female breast: Secondary | ICD-10-CM

## 2020-06-29 MED ORDER — ANASTROZOLE 1 MG PO TABS: 1.0000 mg | ORAL_TABLET | Freq: Every day | ORAL | 4 refills | Status: DC

## 2020-06-30 DIAGNOSIS — I10 Essential (primary) hypertension: Secondary | ICD-10-CM | POA: Diagnosis not present

## 2020-06-30 DIAGNOSIS — R748 Abnormal levels of other serum enzymes: Secondary | ICD-10-CM | POA: Diagnosis not present

## 2020-06-30 DIAGNOSIS — E7849 Other hyperlipidemia: Secondary | ICD-10-CM | POA: Diagnosis not present

## 2020-07-15 ENCOUNTER — Encounter: Payer: Self-pay | Admitting: Orthopaedic Surgery

## 2020-07-15 ENCOUNTER — Ambulatory Visit (INDEPENDENT_AMBULATORY_CARE_PROVIDER_SITE_OTHER): Payer: Medicare Other

## 2020-07-15 ENCOUNTER — Ambulatory Visit (INDEPENDENT_AMBULATORY_CARE_PROVIDER_SITE_OTHER): Payer: Medicare Other | Admitting: Orthopaedic Surgery

## 2020-07-15 ENCOUNTER — Other Ambulatory Visit: Payer: Self-pay

## 2020-07-15 VITALS — Ht 63.0 in | Wt 215.0 lb

## 2020-07-15 DIAGNOSIS — G8929 Other chronic pain: Secondary | ICD-10-CM | POA: Diagnosis not present

## 2020-07-15 DIAGNOSIS — M25512 Pain in left shoulder: Secondary | ICD-10-CM

## 2020-07-15 DIAGNOSIS — M7542 Impingement syndrome of left shoulder: Secondary | ICD-10-CM | POA: Diagnosis not present

## 2020-07-15 MED ORDER — LIDOCAINE HCL 2 % IJ SOLN
2.0000 mL | INTRAMUSCULAR | Status: AC | PRN
  Administered 2020-07-15: 12:00:00 2 mL

## 2020-07-15 MED ORDER — BUPIVACAINE HCL 0.5 % IJ SOLN
2.0000 mL | INTRAMUSCULAR | Status: AC | PRN
  Administered 2020-07-15: 12:00:00 2 mL via INTRA_ARTICULAR

## 2020-07-15 NOTE — Progress Notes (Signed)
Office Visit Note   Patient: Caitlin [name]           Date of Birth: October 17, 1945           MRN: [id number] Visit Date: 07/15/2020              Requested by: Sharilyn Sites, [place],  [place] [id number] PCP: Sharilyn Sites, MD   Assessment & Plan: Visit Diagnoses:  1. Chronic left shoulder pain   2. Impingement syndrome of left shoulder     Plan: [name] has been experiencing recurrent left shoulder pain for approximately 3 months. She is not aware of any specific injury or trauma. Approximately 4-1/2 months status post reverse shoulder arthroplasty by Dr. Marlou Sa and doing quite well. Symptoms are consistent with impingement. Will inject the subacromial space with betamethasone and monitor response. If no improvement would suggest an MRI scan. She will call and let me know  Follow-Up Instructions: Return if symptoms worsen or fail to improve.   Orders:  Orders Placed This Encounter  Procedures  . Large Joint Inj: L subacromial bursa  . XR Shoulder Left   No orders of the defined types were placed in this encounter.     Procedures: Large Joint Inj: L subacromial bursa on 07/15/2020 11:35 AM Indications: pain and diagnostic evaluation Details: 25 G 1.5 in needle, anterolateral approach  Arthrogram: No  Medications: 2 mL lidocaine 2 %; 2 mL bupivacaine 0.5 %  2 mL betamethasone injected in the subacromial space left shoulder with Xylocaine and Marcaine Consent was given by the patient. Immediately prior to procedure a time out was called to verify the correct patient, procedure, equipment, support staff and site/side marked as required. Patient was prepped and draped in the usual sterile fashion.       Clinical Data: No additional findings.   Subjective: Chief Complaint  Patient presents with  . Left Shoulder - Pain  Patient presents today for left shoulder pain. She states that she had surgery on her right shoulder in August of this year.  Following her surgery she was more reliant on her left arm and then noticed her left shoulder began to hurt. Her pain is located at her anterior shoulder and proximal left arm. She cannot reach backwards and does have some limited range of motion. No numbness or tingling. She has been taking Tylenol for pain relief. She is right hand dominant.   HPI  Review of Systems   Objective: Vital Signs: Ht 5\' 3"  (1.6 m)   Wt 215 lb (97.5 kg)   BMI 38.09 kg/m   Physical Exam Constitutional:      Appearance: She is well-developed and well-nourished.  HENT:     Mouth/Throat:     Mouth: Oropharynx is clear and moist.  Eyes:     Extraocular Movements: EOM normal.     Pupils: Pupils are equal, round, and reactive to light.  Pulmonary:     Effort: Pulmonary effort is normal.  Skin:    General: Skin is warm and dry.  Neurological:     Mental Status: She is alert and oriented to person, place, and time.  Psychiatric:        Mood and Affect: Mood and affect normal.        Behavior: Behavior normal.     Ortho Exam awake alert and oriented x3. Comfortable sitting positive impingement left shoulder. Also has positive empty can testing. Minimally positive Speed sign. Good grip and good release  and good strength. Able to place left arm almost completely overhead. Might have very minimal adhesive capsulitis. Some tenderness in the anterior subacromial region but no grating or crepitation  Specialty Comments:  No specialty comments available.  Imaging: XR Shoulder Left  Result Date: 07/15/2020 Films of the left shoulder obtained in several projections. There is one small area of ectopic calcification about the greater tuberosity consistent with calcific tendinitis. Normal space between the humeral head and the acromium. Humeral head is centered about the glenoid. No obvious degenerative changes of the glenohumeral joint. There are mild degenerative changes at the [facility] joint    PMFS  History: Patient Active Problem List   Diagnosis Date Noted  . Impingement syndrome of left shoulder 07/15/2020  . S/P reverse total shoulder arthroplasty, right 03/03/2020  . Primary osteoarthritis, right shoulder 12/04/2019  . Chronic right shoulder pain 11/29/2018  . Localized swelling of left lower extremity 07/19/2018  . Cellulitis and abscess of left leg 06/15/2018  . Unilateral primary osteoarthritis, left knee 05/02/2017  . S/P total knee replacement using cement, left 05/02/2017  . Primary osteoarthritis of left knee 12/21/2016  . Chondromalacia patellae, left knee 05/26/2016  . Breast cancer of upper-inner quadrant of right female breast ([place]) 11/04/2015  . GERD (gastroesophageal reflux disease) 09/02/2013  . Obesity 09/02/2013  . Ventral hernia, recurrent 09/02/2013  . Unspecified asthma(493.90) 12/12/2012  . Cough variant asthma 03/27/2012  . Right middle lobe syndrome 03/27/2012   Past Medical History:  Diagnosis Date  . Arthritis   . Asthma   . Atypical mole 04/22/2020   mod-left lower back  . Breast cancer ([place])   . Breast disorder    cancer  . Breast mass    benign  . Cellulitis   . GERD (gastroesophageal reflux disease)   . Heart murmur    AS CHILD  OUTGREW IT  . History of hiatal hernia   . Hyperlipidemia   . Hypertension   . Impaired fasting glucose   . Rectocele   . Varicose vein of leg     Family History  Problem Relation Age of Onset  . Colon cancer Father   . Rectal cancer Father   . Prostate cancer Father   . Dementia Mother   . Osteoporosis Mother   . Healthy Daughter   . Healthy Son   . Pancreatic cancer Paternal Grandfather   . Heart disease Paternal Grandmother   . Heart disease Maternal Grandfather   . Breast cancer Maternal Grandmother   . Scoliosis Sister     Past Surgical History:  Procedure Laterality Date  . BREAST LUMPECTOMY WITH RADIOACTIVE SEED AND SENTINEL LYMPH NODE BIOPSY Right 11/11/2015   Procedure: RIGHT BREAST  RADIOACTIVE SEED GUIDED  LUMPECTOMY AND RADIOACTIVE SEED TARGETED SENTINEL LYMPH NODE BIOPSY;  Surgeon: Stark Klein, MD;  Location: [place];  Service: General;  Laterality: Right;  . BREAST SURGERY Left    breast biopies x4  . CHOLECYSTECTOMY  09/08/2010  . COLONOSCOPY    . COLONOSCOPY N/A 09/25/2013   Procedure: COLONOSCOPY;  Surgeon: Rogene Houston, MD;  [facility];  Service: Endoscopy;  Laterality: N/A;  100-moved to 1200 Ann to notify pt  . HERNIA REPAIR  12/24/2010, 12-2014   TOTAL 4  . LAPAROSCOPIC GASTRIC SLEEVE RESECTION  11-2013   at [place]  2007/2008  . REVERSE SHOULDER ARTHROPLASTY Right 03/03/2020   Procedure: RIGHT REVERSE SHOULDER ARTHROPLASTY;  Surgeon: Meredith Pel,  MD;  Location: [place];  Service: Orthopedics;  Laterality: Right;  . TONSILLECTOMY  1968  . TOTAL KNEE ARTHROPLASTY Left 05/02/2017  . TOTAL KNEE ARTHROPLASTY Left 05/02/2017   Procedure: LEFT TOTAL KNEE ARTHROPLASTY;  Surgeon: [name], MD;  Location: [place];  Service: Orthopedics;  Laterality: Left;  . UPPER GASTROINTESTINAL ENDOSCOPY    . VAGINAL HYSTERECTOMY  1980s   Social History   Occupational History  . Occupation: retired    Fish farm manager: RETIRED    Comment: teacher  Tobacco Use  . Smoking status: Never Smoker  . Smokeless tobacco: Never Used  Vaping Use  . Vaping Use: Never used  Substance and Sexual Activity  . Alcohol use: No  . Drug use: No  . Sexual activity: Yes    Birth control/protection: Surgical    Comment: hyst

## 2020-07-21 ENCOUNTER — Ambulatory Visit: Payer: Medicare Other | Admitting: Adult Health

## 2020-07-21 ENCOUNTER — Ambulatory Visit: Payer: Medicare Other | Admitting: Pulmonary Disease

## 2020-08-04 ENCOUNTER — Ambulatory Visit: Payer: Medicare Other | Admitting: Dermatology

## 2020-08-29 DIAGNOSIS — I10 Essential (primary) hypertension: Secondary | ICD-10-CM | POA: Diagnosis not present

## 2020-08-29 DIAGNOSIS — E7849 Other hyperlipidemia: Secondary | ICD-10-CM | POA: Diagnosis not present

## 2020-09-02 ENCOUNTER — Telehealth: Payer: Self-pay | Admitting: Genetic Counselor

## 2020-09-02 NOTE — Telephone Encounter (Signed)
Received a genetic counseling referral from Dr. Barry Dienes for breast cancer. Ms. Colantuono has been cld and scheduled for a mychart video visit w/Karen on 2/7 at 2pm.

## 2020-09-04 ENCOUNTER — Telehealth: Payer: Self-pay | Admitting: Genetic Counselor

## 2020-09-04 NOTE — Telephone Encounter (Signed)
Contacted patient to verify mychart video visit for pre reg 

## 2020-09-07 ENCOUNTER — Inpatient Hospital Stay: Payer: Medicare Other | Attending: Genetic Counselor | Admitting: Genetic Counselor

## 2020-09-07 DIAGNOSIS — Z170 Estrogen receptor positive status [ER+]: Secondary | ICD-10-CM | POA: Diagnosis not present

## 2020-09-07 DIAGNOSIS — Z800 Family history of malignant neoplasm of digestive organs: Secondary | ICD-10-CM

## 2020-09-07 DIAGNOSIS — Z803 Family history of malignant neoplasm of breast: Secondary | ICD-10-CM

## 2020-09-07 DIAGNOSIS — Z8042 Family history of malignant neoplasm of prostate: Secondary | ICD-10-CM

## 2020-09-07 DIAGNOSIS — C50211 Malignant neoplasm of upper-inner quadrant of right female breast: Secondary | ICD-10-CM

## 2020-09-09 ENCOUNTER — Encounter: Payer: Self-pay | Admitting: Genetic Counselor

## 2020-09-09 DIAGNOSIS — Z8042 Family history of malignant neoplasm of prostate: Secondary | ICD-10-CM

## 2020-09-09 DIAGNOSIS — Z803 Family history of malignant neoplasm of breast: Secondary | ICD-10-CM

## 2020-09-09 DIAGNOSIS — Z800 Family history of malignant neoplasm of digestive organs: Secondary | ICD-10-CM

## 2020-09-09 HISTORY — DX: Family history of malignant neoplasm of prostate: Z80.42

## 2020-09-09 HISTORY — DX: Family history of malignant neoplasm of digestive organs: Z80.0

## 2020-09-09 HISTORY — DX: Family history of malignant neoplasm of breast: Z80.3

## 2020-09-09 NOTE — Progress Notes (Signed)
REFERRING PROVIDER: Stark Klein, MD [address] [place],  [place] [address]  PRIMARY PROVIDER:  Sharilyn Sites, MD  PRIMARY REASON FOR VISIT:  1. Malignant neoplasm of upper-inner quadrant of right breast in female, estrogen receptor positive ([place])   2. Family history of breast cancer   3. Family history of pancreatic cancer   4. Family history of rectal cancer   5. Family history of prostate cancer    I connected with Ms. Debruhl on 09/07/2020 at 2:10pm EDT by [facility] video conference and verified that I am speaking with the correct person using two identifiers.   Patient location: Home Provider location: [facility] Office   HISTORY OF PRESENT ILLNESS:   Ms. Trevizo, a 75 y.o. female, was seen for a [place] cancer genetics consultation at the request of Dr. Barry Dienes due to a personal and family history of cancer.  Ms. Melle presents to clinic with her husband, [name], today to discuss the possibility of a hereditary predisposition to cancer, to discuss genetic testing, and to further clarify her future cancer risks, as well as potential cancer risks for family members.   In 2017, at the age of 47, Ms. Nehring was diagnosed with invasive ductal carcinoma of the right breast (ER+/PR+/HER2-). The treatment plan included lumpectomy, radiation, and anti-estrogens.   CANCER HISTORY:  Oncology History  Breast cancer of upper-inner quadrant of right female breast ([place])  09/24/2014 Imaging   DEXA with normal bone density   10/06/2015 Imaging   Screening bilateral mammogram BI-RADS Category 0, R breast possible mass, L breast possible mass   10/20/2015 Imaging   Diagnostic B mammogram, R breast Ultrasound with suspicious mass in the R breast at the 1 o clock location 5 cm from the nipple, LN in the low R axillae with cortex nodularity   10/27/2015 Initial Biopsy   Ultrasound guided biopsy of R axillary LN, BENIGN. Ultrasound guided biopsy of R breast with invasive ductal carcinoma    11/04/2015 Initial Diagnosis   Breast cancer of upper-inner quadrant of right female breast ([place])   11/09/2015 Procedure   Radioactive seed localization R breast   11/11/2015 Surgery   R breast radioactive seed localized lumpectomy and seed targeted sentinel LN biopsy   11/11/2015 Pathology Results   Invasive ductal carcinoma Grade I/III spanning 1.1 cm, lobular neoplasia (LCIS) resection margins negative. 3 negative sentinel nodes ER (100%), PR (100%), Her2 neu negative pT1c, pN0   12/11/2015 Oncotype testing   Recurrence Score result 11, 10 year risk of distant recurrence with tamoxifen alone 8% (low-risk)   01/11/2016 - 02/04/2016 Radiation Therapy   Adjuvant breast radiation ([facility]): 42.72 Gy in 16 fractions to the right breast   01/2016 -  Anti-estrogen oral therapy   Anastrazole 1 mg daily. Planned duration of therapy: 5 years.      RISK FACTORS:  Menarche was at age 7.  First live birth at age 19.  OCP use for approximately 3-5 years.  Ovaries intact: yes.  Hysterectomy: removed in 1980s due to abnormal bleeding.  Menopausal status: postmenopausal.  HRT use: 0 years. Colonoscopy: yes; most recent approximately 9 years ago; scheduled for follow-up next year. Mammogram within the last year: yes. Any excessive radiation exposure in the past: no  Past Medical History:  Diagnosis Date  . Arthritis   . Asthma   . Atypical mole 04/22/2020   mod-left lower back  . Breast cancer ([place])   . Breast disorder    cancer  . Breast mass  benign  . Cellulitis   . Family history of breast cancer 09/09/2020  . Family history of pancreatic cancer 09/09/2020  . Family history of prostate cancer 09/09/2020  . GERD (gastroesophageal reflux disease)   . Heart murmur    AS CHILD  OUTGREW IT  . History of hiatal hernia   . Hyperlipidemia   . Hypertension   . Impaired fasting glucose   . Rectocele   . Varicose vein of leg     Past Surgical History:  Procedure Laterality Date   . BREAST LUMPECTOMY WITH RADIOACTIVE SEED AND SENTINEL LYMPH NODE BIOPSY Right 11/11/2015   Procedure: RIGHT BREAST RADIOACTIVE SEED GUIDED  LUMPECTOMY AND RADIOACTIVE SEED TARGETED SENTINEL LYMPH NODE BIOPSY;  Surgeon: Stark Klein, MD;  Location: [facility];  Service: General;  Laterality: Right;  . BREAST SURGERY Left    breast biopies x4  . CHOLECYSTECTOMY  09/08/2010  . COLONOSCOPY    . COLONOSCOPY N/A 09/25/2013   Procedure: COLONOSCOPY;  Surgeon: Rogene Houston, MD;  [facility];  Service: Endoscopy;  Laterality: N/A;  100-moved to 1200 Ann to notify pt  . HERNIA REPAIR  12/24/2010, 12-2014   TOTAL 4  . LAPAROSCOPIC GASTRIC SLEEVE RESECTION  11-2013   at [facility]  2007/2008  . REVERSE SHOULDER ARTHROPLASTY Right 03/03/2020   Procedure: RIGHT REVERSE SHOULDER ARTHROPLASTY;  Surgeon: Meredith Pel, MD;  Location: [facility];  Service: Orthopedics;  Laterality: Right;  . TONSILLECTOMY  1968  . TOTAL KNEE ARTHROPLASTY Left 05/02/2017  . TOTAL KNEE ARTHROPLASTY Left 05/02/2017   Procedure: LEFT TOTAL KNEE ARTHROPLASTY;  Surgeon: [name], MD;  Location: [facility];  Service: Orthopedics;  Laterality: Left;  . UPPER GASTROINTESTINAL ENDOSCOPY    . VAGINAL HYSTERECTOMY  1980s    Social History   Socioeconomic History  . Marital status: Married    Spouse name: Not on file  . Number of children: 2  . Years of education: Not on file  . Highest education level: Not on file  Occupational History  . Occupation: retired    Fish farm manager: RETIRED    Comment: teacher  Tobacco Use  . Smoking status: Never Smoker  . Smokeless tobacco: Never Used  Vaping Use  . Vaping Use: Never used  Substance and Sexual Activity  . Alcohol use: No  . Drug use: No  . Sexual activity: Yes    Birth control/protection: Surgical    Comment: hyst  Other Topics Concern  . Not on file  Social History Narrative  . Not on file   Social  Determinants of Health   Financial Resource Strain: Not on file  Food Insecurity: Not on file  Transportation Needs: Not on file  Physical Activity: Not on file  Stress: Not on file  Social Connections: Not on file     FAMILY HISTORY:  We obtained a detailed, 4-generation family history.  Significant diagnoses are listed below: Family History  Problem Relation Age of Onset  . Rectal cancer Father        dx 43s  . Prostate cancer Father        dx 37s  . Breast cancer Daughter 74       triple negative  . Pancreatic cancer Paternal Grandfather        dx 50s-60s  . Breast cancer Maternal Grandmother        dx 28s  . Cancer Sister  face/neck ?skin    Ms. Klinke has one daughter and one son in their 10s.  Her daughter was recently diagnosed with triple negative breast cancer at 33.  She had genetic testing in January 2022 through [facility].  The [facility] gene panel offered by [employer] includes sequencing and deletion/duplication testing of the following 35 genes: APC, ATM, AXIN2, BARD1, BMPR1A, BRCA1, BRCA2, BRIP1, CHD1, CDK4, CDKN2A, CHEK2, EPCAM (large rearrangement only), HOXB13, (sequencing only), GALNT12, MLH1, MSH2, MSH3 (excluding repetitive portions of exon 1), MSH6, MUTYH, NBN, NTHL1, PALB2, PMS2, PTEN, RAD51C, RAD51D, RNF43, RPS20, SMAD4, STK11, and TP53. Sequencing was performed for select regions of POLE and POLD1, and large rearrangement analysis was performed for select regions of GREM1.  Her daughter was found to have a heterozygous pathogenic variant in MUTYH at c.1187G>A (p.Gly396Asp).    Ms. Mersch has three sisters, one of whom had a type of face/neck cancer.  Ms. Manzi mother died at age 62 and did not have cancer.  Ms. Versteeg maternal grandmother had breast cancer diagnosed in her 30s.  Ms. Reifsteck father died at age 38 and was diagnosed with rectal cancer and prostate cancer in his 58s. Ms. Coxe paternal grandfather had a history of  pancreatic cancer in his 39s or 27s.  No other family history of cancer was reported.   Ms. Fairbairn is unaware of previous family history of genetic testing for hereditary cancer risks besides that mentioned above. Patient's maternal ancestors are of White/Caucasian descent, and paternal ancestors are of [place] descent. There is no reported Ashkenazi Jewish ancestry. There is no known consanguinity.  GENETIC COUNSELING ASSESSMENT: Ms. Zalenski is a 75 y.o. female with a personal and family history of cancer which is somewhat suggestive of a hereditary cancer syndrome and predisposition to cancer given the presence of related cancers in the family. We, therefore, discussed and recommended the following at today's visit.   DISCUSSION: We discussed that 5 - 10% of cancer is hereditary, with most cases of hereditary breast cancer associated with mutations in BRCA1/2.  There are other genes that can be associated with hereditary breast cancer syndromes.  Type of cancer risk and level of risk are gene-specific.  We briefly discussed the familial mutation in MUTYH.  Given that her daughter has this mutation, there is a 50% chance that Ms. Aden also has this mutation.  We dicussed that heterozygous MUTYH is associated with possible slight increased risk of colon cancer compared to the general population. We discussed that testing is beneficial for several reasons including knowing how to follow individuals for their cancer risk and understanding if other family members could be at risk for cancer and allowing them to undergo genetic testing.   We reviewed the characteristics, features and inheritance patterns of hereditary cancer syndromes. We also discussed genetic testing, including the appropriate family members to test, the process of testing, insurance coverage and turn-around-time for results. We discussed the implications of a negative, positive, carrier and/or variant of uncertain significant result. We  recommended Ms. Jauregui pursue genetic testing for a panel that includes genes associated with breast and pancreatic cancer as well as MUTYH.   Ms. Ausburn  was offered a common hereditary cancer panel (47 genes) and an expanded pan-cancer panel (84 genes). Ms. Bradeen was informed of the benefits and limitations of each panel, including that expanded pan-cancer panels contain genes that do not have clear management guidelines at this point in time.  We also discussed that as the number of genes  included on a panel increases, the chances of variants of uncertain significance increases.  After considering the benefits and limitations of each gene panel, Ms. Stokke  elected to have a common hereditary cancer panel through [facility].   The Common Hereditary Cancers + RNA Panel offered by [facility] includes sequencing, deletion/duplication, and RNA testing of the following 47 genes: APC, ATM, AXIN2, BARD1, BMPR1A, BRCA1, BRCA2, BRIP1, CDH1, CDK4*, CDKN2A (p14ARF)*, CDKN2A (p16INK4a)*, CHEK2, CTNNA1, DICER1, EPCAM (Deletion/duplication testing only), GREM1 (promoter region deletion/duplication testing only), KIT, MEN1, MLH1, MSH2, MSH3, MSH6, MUTYH, NBN, NF1, NHTL1, PALB2, PDGFRA*, PMS2, POLD1, POLE, PTEN, RAD50, RAD51C, RAD51D, SDHB, SDHC, SDHD, SMAD4, SMARCA4. STK11, TP53, TSC1, TSC2, and VHL.  The following genes were evaluated for sequence changes only: SDHA and HOXB13 c.251G>A variant only.  RNA analysis is not performed for the * genes.    Based on Ms. Farrier's personal and family history of cancer, she meets medical criteria for genetic testing. Despite that she meets criteria, she may still have an out of pocket cost. We discussed that if she has an out of pocket cost, the laboratory may reach out to her to discuss patient pay options or patient assistance programs.   PLAN: After considering the risks, benefits, and limitations, Ms. Khun provided informed consent to pursue genetic testing.  A blood sample will  be drawn at [facility] on 09/14/2020 at 10am and will sent to [facility] for analysis of the Common Hereditary Cancers +RNA Panel. Results should be available within approximately 3 weeks' time, at which point they will be disclosed by telephone to Ms. Guin, as will any additional recommendations warranted by these results. Ms. Learn will receive a summary of her genetic counseling visit and a copy of her results once available. This information will also be available in Epic.   Lastly, we encouraged Ms. Manges to remain in contact with cancer genetics annually so that we can continuously update the family history and inform her of any changes in cancer genetics and testing that may be of benefit for this family.   Ms. Gordon questions were answered to her satisfaction today. Our contact information was provided should additional questions or concerns arise. Thank you for the referral and allowing [place] to share in the care of your patient.   Jase Reep M. Joette Catching, [place], [facility] Genetic Counselor [email or web address] (P) [phone]  The patient was seen for a total of 40 minutes in face-to-face genetic counseling.  Drs. Magrinat, Lindi Adie and/or Burr Medico were available to discuss this case as needed.    _______________________________________________________________________ For Office Staff:  Number of people involved in session: 1 Was an Intern/ student involved with case: no

## 2020-09-11 ENCOUNTER — Other Ambulatory Visit (HOSPITAL_COMMUNITY): Payer: Self-pay

## 2020-09-11 DIAGNOSIS — C50211 Malignant neoplasm of upper-inner quadrant of right female breast: Secondary | ICD-10-CM

## 2020-09-11 DIAGNOSIS — Z170 Estrogen receptor positive status [ER+]: Secondary | ICD-10-CM

## 2020-09-14 ENCOUNTER — Inpatient Hospital Stay (HOSPITAL_COMMUNITY): Payer: Medicare Other | Attending: Hematology

## 2020-09-14 ENCOUNTER — Other Ambulatory Visit: Payer: Self-pay

## 2020-09-14 DIAGNOSIS — Z803 Family history of malignant neoplasm of breast: Secondary | ICD-10-CM | POA: Diagnosis not present

## 2020-09-14 DIAGNOSIS — Z170 Estrogen receptor positive status [ER+]: Secondary | ICD-10-CM | POA: Diagnosis not present

## 2020-09-14 DIAGNOSIS — Z79811 Long term (current) use of aromatase inhibitors: Secondary | ICD-10-CM | POA: Insufficient documentation

## 2020-09-14 DIAGNOSIS — M8580 Other specified disorders of bone density and structure, unspecified site: Secondary | ICD-10-CM | POA: Insufficient documentation

## 2020-09-14 DIAGNOSIS — Z923 Personal history of irradiation: Secondary | ICD-10-CM | POA: Diagnosis not present

## 2020-09-14 DIAGNOSIS — C50211 Malignant neoplasm of upper-inner quadrant of right female breast: Secondary | ICD-10-CM | POA: Diagnosis not present

## 2020-09-14 DIAGNOSIS — Z8042 Family history of malignant neoplasm of prostate: Secondary | ICD-10-CM | POA: Insufficient documentation

## 2020-09-14 DIAGNOSIS — Z808 Family history of malignant neoplasm of other organs or systems: Secondary | ICD-10-CM | POA: Diagnosis not present

## 2020-09-14 DIAGNOSIS — Z800 Family history of malignant neoplasm of digestive organs: Secondary | ICD-10-CM | POA: Diagnosis not present

## 2020-09-14 DIAGNOSIS — J4540 Moderate persistent asthma, uncomplicated: Secondary | ICD-10-CM | POA: Diagnosis not present

## 2020-09-14 DIAGNOSIS — J069 Acute upper respiratory infection, unspecified: Secondary | ICD-10-CM | POA: Diagnosis not present

## 2020-09-14 LAB — CBC WITH DIFFERENTIAL/PLATELET
Abs Immature Granulocytes: 0.02 10*3/uL (ref 0.00–0.07)
Basophils Absolute: 0 10*3/uL (ref 0.0–0.1)
Basophils Relative: 1 %
Eosinophils Absolute: 0.3 10*3/uL (ref 0.0–0.5)
Eosinophils Relative: 7 %
HCT: 35.1 % — ABNORMAL LOW (ref 36.0–46.0)
Hemoglobin: 12 g/dL (ref 12.0–15.0)
Immature Granulocytes: 0 %
Lymphocytes Relative: 25 %
Lymphs Abs: 1.2 10*3/uL (ref 0.7–4.0)
MCH: 34.1 pg — ABNORMAL HIGH (ref 26.0–34.0)
MCHC: 34.2 g/dL (ref 30.0–36.0)
MCV: 99.7 fL (ref 80.0–100.0)
Monocytes Absolute: 0.5 10*3/uL (ref 0.1–1.0)
Monocytes Relative: 11 %
Neutro Abs: 2.8 10*3/uL (ref 1.7–7.7)
Neutrophils Relative %: 56 %
Platelets: 246 10*3/uL (ref 150–400)
RBC: 3.52 MIL/uL — ABNORMAL LOW (ref 3.87–5.11)
RDW: 12.9 % (ref 11.5–15.5)
WBC: 4.9 10*3/uL (ref 4.0–10.5)
nRBC: 0 % (ref 0.0–0.2)

## 2020-09-14 LAB — COMPREHENSIVE METABOLIC PANEL
ALT: 30 U/L (ref 0–44)
AST: 44 U/L — ABNORMAL HIGH (ref 15–41)
Albumin: 3.8 g/dL (ref 3.5–5.0)
Alkaline Phosphatase: 121 U/L (ref 38–126)
Anion gap: 9 (ref 5–15)
BUN: 7 mg/dL — ABNORMAL LOW (ref 8–23)
CO2: 25 mmol/L (ref 22–32)
Calcium: 9.3 mg/dL (ref 8.9–10.3)
Chloride: 94 mmol/L — ABNORMAL LOW (ref 98–111)
Creatinine, Ser: 0.59 mg/dL (ref 0.44–1.00)
GFR, Estimated: >60 mL/min (ref >60)
Glucose, Bld: 105 mg/dL — ABNORMAL HIGH (ref 70–99)
Potassium: 3.7 mmol/L (ref 3.5–5.1)
Sodium: 128 mmol/L — ABNORMAL LOW (ref 135–145)
Total Bilirubin: 0.8 mg/dL (ref 0.3–1.2)
Total Protein: 6.6 g/dL (ref 6.5–8.1)

## 2020-09-14 LAB — VITAMIN B12: Vitamin B-12: 1815 pg/mL — ABNORMAL HIGH (ref 180–914)

## 2020-09-14 LAB — LACTATE DEHYDROGENASE: LDH: 128 U/L (ref 98–192)

## 2020-09-14 LAB — VITAMIN D 25 HYDROXY (VIT D DEFICIENCY, FRACTURES): Vit D, 25-Hydroxy: 67.82 ng/mL (ref 30–100)

## 2020-09-30 DIAGNOSIS — I872 Venous insufficiency (chronic) (peripheral): Secondary | ICD-10-CM | POA: Diagnosis not present

## 2020-09-30 DIAGNOSIS — E7849 Other hyperlipidemia: Secondary | ICD-10-CM | POA: Diagnosis not present

## 2020-09-30 DIAGNOSIS — I10 Essential (primary) hypertension: Secondary | ICD-10-CM | POA: Diagnosis not present

## 2020-09-30 DIAGNOSIS — I8393 Asymptomatic varicose veins of bilateral lower extremities: Secondary | ICD-10-CM | POA: Diagnosis not present

## 2020-09-30 DIAGNOSIS — Z6837 Body mass index (BMI) 37.0-37.9, adult: Secondary | ICD-10-CM | POA: Diagnosis not present

## 2020-09-30 DIAGNOSIS — Z1331 Encounter for screening for depression: Secondary | ICD-10-CM | POA: Diagnosis not present

## 2020-10-06 ENCOUNTER — Telehealth: Payer: Self-pay | Admitting: Genetic Counselor

## 2020-10-06 ENCOUNTER — Encounter: Payer: Self-pay | Admitting: Genetic Counselor

## 2020-10-06 DIAGNOSIS — Z1379 Encounter for other screening for genetic and chromosomal anomalies: Secondary | ICD-10-CM | POA: Insufficient documentation

## 2020-10-06 NOTE — Telephone Encounter (Signed)
Revealed negative genetic testing.  Discussed that we do not know why she has a breast cancer history or why there is cancer in the family. It could be familial, due to a different gene that we are not testing, or maybe our current technology may not be able to pick something up.  It will be important for her to keep in contact with genetics to keep up with whether additional testing may be needed.

## 2020-10-07 ENCOUNTER — Other Ambulatory Visit: Payer: Self-pay

## 2020-10-07 ENCOUNTER — Ambulatory Visit (INDEPENDENT_AMBULATORY_CARE_PROVIDER_SITE_OTHER): Payer: Medicare Other | Admitting: Dermatology

## 2020-10-07 ENCOUNTER — Ambulatory Visit: Payer: Self-pay | Admitting: Genetic Counselor

## 2020-10-07 ENCOUNTER — Encounter: Payer: Self-pay | Admitting: Dermatology

## 2020-10-07 DIAGNOSIS — L720 Epidermal cyst: Secondary | ICD-10-CM | POA: Diagnosis not present

## 2020-10-07 DIAGNOSIS — Z1283 Encounter for screening for malignant neoplasm of skin: Secondary | ICD-10-CM | POA: Diagnosis not present

## 2020-10-07 DIAGNOSIS — L918 Other hypertrophic disorders of the skin: Secondary | ICD-10-CM | POA: Diagnosis not present

## 2020-10-07 DIAGNOSIS — D229 Melanocytic nevi, unspecified: Secondary | ICD-10-CM

## 2020-10-07 DIAGNOSIS — L738 Other specified follicular disorders: Secondary | ICD-10-CM | POA: Diagnosis not present

## 2020-10-07 DIAGNOSIS — D1801 Hemangioma of skin and subcutaneous tissue: Secondary | ICD-10-CM

## 2020-10-07 DIAGNOSIS — C50211 Malignant neoplasm of upper-inner quadrant of right female breast: Secondary | ICD-10-CM

## 2020-10-07 DIAGNOSIS — L821 Other seborrheic keratosis: Secondary | ICD-10-CM

## 2020-10-07 DIAGNOSIS — D2239 Melanocytic nevi of other parts of face: Secondary | ICD-10-CM

## 2020-10-07 DIAGNOSIS — Z170 Estrogen receptor positive status [ER+]: Secondary | ICD-10-CM

## 2020-10-07 DIAGNOSIS — Z800 Family history of malignant neoplasm of digestive organs: Secondary | ICD-10-CM

## 2020-10-07 DIAGNOSIS — Z8042 Family history of malignant neoplasm of prostate: Secondary | ICD-10-CM

## 2020-10-07 DIAGNOSIS — Z803 Family history of malignant neoplasm of breast: Secondary | ICD-10-CM

## 2020-10-07 DIAGNOSIS — Z1379 Encounter for other screening for genetic and chromosomal anomalies: Secondary | ICD-10-CM

## 2020-10-07 NOTE — Progress Notes (Signed)
HPI:  Ms. Zammit was previously seen in the [place] clinic due to a personal and family history of cancer and concerns regarding a hereditary predisposition to cancer. Please refer to our prior cancer genetics clinic note for more information regarding our discussion, assessment and recommendations, at the time. Ms. Hank recent genetic test results were disclosed to her, as were recommendations warranted by these results. These results and recommendations are discussed in more detail below.  CANCER HISTORY:  Oncology History  Breast cancer of upper-inner quadrant of right female breast ([place])  09/24/2014 Imaging   DEXA with normal bone density   10/06/2015 Imaging   Screening bilateral mammogram BI-RADS Category 0, R breast possible mass, L breast possible mass   10/20/2015 Imaging   Diagnostic B mammogram, R breast Ultrasound with suspicious mass in the R breast at the 1 o clock location 5 cm from the nipple, LN in the low R axillae with cortex nodularity   10/27/2015 Initial Biopsy   Ultrasound guided biopsy of R axillary LN, BENIGN. Ultrasound guided biopsy of R breast with invasive ductal carcinoma   11/04/2015 Initial Diagnosis   Breast cancer of upper-inner quadrant of right female breast ([place])   11/09/2015 Procedure   Radioactive seed localization R breast   11/11/2015 Surgery   R breast radioactive seed localized lumpectomy and seed targeted sentinel LN biopsy   11/11/2015 Pathology Results   Invasive ductal carcinoma Grade I/III spanning 1.1 cm, lobular neoplasia (LCIS) resection margins negative. 3 negative sentinel nodes ER (100%), PR (100%), Her2 neu negative pT1c, pN0   12/11/2015 Oncotype testing   Recurrence Score result 11, 10 year risk of distant recurrence with tamoxifen alone 8% (low-risk)   01/11/2016 - 02/04/2016 Radiation Therapy   Adjuvant breast radiation ([facility]): 42.72 Gy in 16 fractions to the right breast   01/2016 -  Anti-estrogen  oral therapy   Anastrazole 1 mg daily. Planned duration of therapy: 5 years.    10/06/2020 Genetic Testing   Negative hereditary cancer genetic testing: no pathogenic variants detected in [facility] Common Hereditary Cancers +RNA Panel.  The report date is October 06, 2020.   The Common Hereditary Cancers + RNA Panel offered by [facility] includes sequencing, deletion/duplication, and RNA testing of the following 47 genes: APC, ATM, AXIN2, BARD1, BMPR1A, BRCA1, BRCA2, BRIP1, CDH1, CDK4*, CDKN2A (p14ARF)*, CDKN2A (p16INK4a)*, CHEK2, CTNNA1, DICER1, EPCAM (Deletion/duplication testing only), GREM1 (promoter region deletion/duplication testing only), KIT, MEN1, MLH1, MSH2, MSH3, MSH6, MUTYH, NBN, NF1, NHTL1, PALB2, PDGFRA*, PMS2, POLD1, POLE, PTEN, RAD50, RAD51C, RAD51D, SDHB, SDHC, SDHD, SMAD4, SMARCA4. STK11, TP53, TSC1, TSC2, and VHL.  The following genes were evaluated for sequence changes only: SDHA and HOXB13 c.251G>A variant only.  RNA analysis is not performed for the * genes.       FAMILY HISTORY:  We obtained a detailed, 4-generation family history.  Significant diagnoses are listed below: Family History  Problem Relation Age of Onset  . Rectal cancer Father        dx 29s  . Prostate cancer Father        dx 66s  . Breast cancer Daughter 58       triple negative  . Pancreatic cancer Paternal Grandfather        dx 50s-60s  . Breast cancer Maternal Grandmother        dx 69s  . Cancer Sister        face/neck ?skin     Ms. Burgoon has one daughter and one son  in their 44s.  Her daughter was recently diagnosed with triple negative breast cancer at 65.  She had genetic testing in January 2022 through [facility].  The [facility] gene panel offered by [employer] includes sequencing and deletion/duplication testing of the following 35 genes: APC, ATM, AXIN2, BARD1, BMPR1A, BRCA1, BRCA2, BRIP1, CHD1, CDK4, CDKN2A, CHEK2, EPCAM (large rearrangement only), HOXB13, (sequencing only), GALNT12, MLH1,  MSH2, MSH3 (excluding repetitive portions of exon 1), MSH6, MUTYH, NBN, NTHL1, PALB2, PMS2, PTEN, RAD51C, RAD51D, RNF43, RPS20, SMAD4, STK11, and TP53. Sequencing was performed for select regions of POLE and POLD1, and large rearrangement analysis was performed for select regions of GREM1.  Her daughter was found to have a heterozygous pathogenic variant in MUTYH at c.1187G>A (p.Gly396Asp).    Ms. Droz has three sisters, one of whom had a type of face/neck cancer.  Ms. Madkins mother died at age 74 and did not have cancer.  Ms. Allmon maternal grandmother had breast cancer diagnosed in her 46s.  Ms. Witzke father died at age 15 and was diagnosed with rectal cancer and prostate cancer in his 41s. Ms. Canion paternal grandfather had a history of pancreatic cancer in his 48s or 36s.  No other family history of cancer was reported.   Ms. Cordner is unaware of previous family history of genetic testing for hereditary cancer risks besides that mentioned above. Patient's maternal ancestors are of White/Caucasian descent, and paternal ancestors are of [place] descent. There is no reported Ashkenazi Jewish ancestry. There is no known consanguinity.  GENETIC TEST RESULTS: Genetic testing reported out on October 06, 2020.  The [facility] Common Hereditary Cancers +RNA Panel found no pathogenic mutations.  The Common Hereditary Cancers + RNA Panel offered by [facility] includes sequencing, deletion/duplication, and RNA testing of the following 47 genes: APC, ATM, AXIN2, BARD1, BMPR1A, BRCA1, BRCA2, BRIP1, CDH1, CDK4*, CDKN2A (p14ARF)*, CDKN2A (p16INK4a)*, CHEK2, CTNNA1, DICER1, EPCAM (Deletion/duplication testing only), GREM1 (promoter region deletion/duplication testing only), KIT, MEN1, MLH1, MSH2, MSH3, MSH6, MUTYH, NBN, NF1, NHTL1, PALB2, PDGFRA*, PMS2, POLD1, POLE, PTEN, RAD50, RAD51C, RAD51D, SDHB, SDHC, SDHD, SMAD4, SMARCA4. STK11, TP53, TSC1, TSC2, and VHL.  The following genes were evaluated for sequence changes  only: SDHA and HOXB13 c.251G>A variant only.  RNA analysis is not performed for the * genes.    The test report has been scanned into EPIC and is located under the Molecular Pathology section of the Results Review tab.  A portion of the result report is included below for reference.     We discussed with Ms. Abrams that because current genetic testing is not perfect, it is possible there may be a gene mutation in one of these genes that current testing cannot detect, but that chance is small.  We also discussed, that there could be another gene that has not yet been discovered, or that we have not yet tested, that is responsible for the cancer diagnoses in the family. It is also possible there is a hereditary cause for the cancer in the family that Ms. Floresca did not inherit and therefore was not identified in her testing.  Therefore, it is important to remain in touch with cancer genetics in the future so that we can continue to offer Ms. Cuadras the most up to date genetic testing.   We recommended Ms. Moncayo pursue testing for the familial hereditary cancer gene mutation called MUTYH c.11887G>A (p.Gly396Asp). Ms. Bachtell test was normal and did not reveal the familial mutation.   ADDITIONAL GENETIC TESTING: We discussed with  Ms. Mathias that there are other genes that are associated with increased cancer risk that can be analyzed. Should Ms. Bickley wish to pursue additional genetic testing, we are happy to discuss and coordinate this testing, at any time.    CANCER SCREENING RECOMMENDATIONS: Ms. Piercey test result is considered negative (normal).  This means that we have not identified a hereditary cause for her personal and family history of cancer at this time. Most cancers happen by chance and this negative test suggests that her cancer may fall into this category.    While reassuring, this does not definitively rule out a hereditary predisposition to cancer. It is still possible that there  could be genetic mutations that are undetectable by current technology. There could be genetic mutations in genes that have not been tested or identified to increase cancer risk.  Therefore, it is recommended she continue to follow the cancer management and screening guidelines provided by her oncology and primary healthcare provider.   An individual's cancer risk and medical management are not determined by genetic test results alone. Overall cancer risk assessment incorporates additional factors, including personal medical history, family history, and any available genetic information that may result in a personalized plan for cancer prevention and surveillance  RECOMMENDATIONS FOR FAMILY MEMBERS:  Individuals in this family might be at some increased risk of developing cancer, over the general population risk, simply due to the family history of cancer.  We recommended women in this family have a yearly mammogram beginning at age 83, or 26 years younger than the earliest onset of cancer, an annual clinical breast exam, and perform monthly breast self-exams. Women in this family should also have a gynecological exam as recommended by their primary provider.  First degree relatives of those with colon cancer should receive colonoscopies beginning at age 34, or 10 years prior to the earliest diagnosis of colon cancer in the family, and receive colonoscopies at least every 5 years, or as recommended by their gastroenterologist.    FOLLOW-UP: Lastly, we discussed with Ms. Agent that cancer genetics is a rapidly advancing field and it is possible that new genetic tests will be appropriate for her and/or her family members in the future. We encouraged her to remain in contact with cancer genetics on an annual basis so we can update her personal and family histories and let her know of advances in cancer genetics that may benefit this family.   Our contact number was provided. Ms. Hirt questions were  answered to her satisfaction, and she knows she is welcome to call us at anytime with additional questions or concerns.    Alcus Bradly M. Joette Catching, [place], [facility] Genetic Counselor [email or web address] (P) [phone]

## 2020-10-12 ENCOUNTER — Encounter (INDEPENDENT_AMBULATORY_CARE_PROVIDER_SITE_OTHER): Payer: Self-pay | Admitting: *Deleted

## 2020-10-21 ENCOUNTER — Encounter (INDEPENDENT_AMBULATORY_CARE_PROVIDER_SITE_OTHER): Payer: Self-pay | Admitting: *Deleted

## 2020-10-27 ENCOUNTER — Encounter: Payer: Self-pay | Admitting: Dermatology

## 2020-10-27 NOTE — Progress Notes (Addendum)
   Follow-Up Visit   Subjective  Caitlin [name] is a 75 y.o. female who presents for the following: Follow-up (1. Skin , left lower back/DYSPLASTIC JUNCTIONAL LENTIGINOUS NEVUS WITH MODERATE ATYPIA, CLOSE TO MARGIN). Several skin issues to address Location:  Duration:  Quality:  Associated Signs/Symptoms: Modifying Factors:  Severity:  Timing: Context:   Objective  Well appearing patient in no apparent distress; mood and affect are within normal limits. Objective  Waist Up: Waist up exam today no signs of atypical moles, melanoma or non mole skin cancer.  Objective  Left Inframammary Fold, Mid Back, Right Inframammary Fold: Multiple vascular red dots.  Objective  Right Malar Cheek: 3 mm eccentrically delled pink papule  Objective  Right Anterior Mandible: Flesh-colored smooth 5 mm papule; dermoscopy  shows no changes of BCC  Objective  Chest - Medial (Center), Right Inframammary Fold: Stuck-on, waxy papules and plaques.   Objective  Right Axilla: Fleshy, skin-colored  pedunculated papules.    Objective  V of Neck: Small epidermoid cyst versus large milium   All skin waist up examined.  Areas beneath undergarments not examined.   Assessment & Plan    Skin exam for malignant neoplasm Waist Up  Yearly skin check  Hemangioma of skin (3) Left Inframammary Fold; Right Inframammary Fold; Mid Back  Benign okay to leave.  Sebaceous hyperplasia Right Malar Cheek  Told that this may resemble early basal cell skin cancer so if there is growth or bleeding return for biopsy  Nevus Right Anterior Mandible  Observe  Seborrheic keratosis (2) Chest - Medial (Center); Right Inframammary Fold  Benign okay to leave.  Skin tag Right Axilla  Benign okay to leave unless patient wants removed.  Epidermoid cyst V of Neck  I&D showed no definite cyst wall, 1 second cautery of base.  Incision and Drainage - V of Neck Location V of neck   Informed consent  was obtained.  Preparation: The area was prepped with Hibiclens     The patient tolerated procedure well.  Total number of lesions drained: 1        I, Lavonna Monarch, MD, have reviewed all documentation for this visit.  The documentation on 11/12/20 for the exam, diagnosis, procedures, and orders are all accurate and complete.

## 2020-10-28 DIAGNOSIS — I10 Essential (primary) hypertension: Secondary | ICD-10-CM | POA: Diagnosis not present

## 2020-10-28 DIAGNOSIS — E7849 Other hyperlipidemia: Secondary | ICD-10-CM | POA: Diagnosis not present

## 2020-11-09 ENCOUNTER — Telehealth (INDEPENDENT_AMBULATORY_CARE_PROVIDER_SITE_OTHER): Payer: Self-pay | Admitting: *Deleted

## 2020-11-09 ENCOUNTER — Encounter (INDEPENDENT_AMBULATORY_CARE_PROVIDER_SITE_OTHER): Payer: Self-pay | Admitting: *Deleted

## 2020-11-09 ENCOUNTER — Other Ambulatory Visit (INDEPENDENT_AMBULATORY_CARE_PROVIDER_SITE_OTHER): Payer: Self-pay | Admitting: *Deleted

## 2020-11-09 MED ORDER — PEG 3350-KCL-NA BICARB-NACL 420 G PO SOLR: 4000.0000 mL | Freq: Once | ORAL | 0 refills | Status: AC

## 2020-11-09 NOTE — Telephone Encounter (Signed)
Referring MD/PCP: golding   Procedure: tcs  Reason/Indication:  Hx polyps  Has patient had this procedure before?  Yes, 2015  If so, when, by whom and where?    Is there a family history of colon cancer?  no  Who?  What age when diagnosed?    Is patient diabetic?   no      Does patient have prosthetic heart valve or mechanical valve?  no  Do you have a pacemaker/defibrillator?  no  Has patient ever had endocarditis/atrial fibrillation? no  Have you had a stroke/heart attack last 6 mths? no  Does patient use oxygen? no  Has patient had joint replacement within last 12 months?  Yes 03/2020  Is patient constipated or do they take laxatives? no  Does patient have a history of alcohol/drug use?  no  Is patient on blood thinner such as Coumadin, Plavix and/or Aspirin? no  Do you take medicine for weight loss. no  Medications: anastrozole 1 mg daily, zithromax 250 mg daily, brezti aerosphere 1-2 puffs daily, calcium daily, vit d 25 mcg daily, vit b12 2500 mcg daily, colace 100 mg daily, esomeprazole 20 mg daily, famotidine 20 mg nightly, fluticasone 1 puff daily, iron/vit c daily, losartan 100/25 mg daily, magnesium 400 mg daily, melatonin 10 mg, vit k2 10 mcg daily, mvi daily, pravastatin 40 mg daily, probiotic daily  Allergies: horse derived products, sulfur  Medication Adjustment per Dr Rehman/Dr Jenetta Downer iron 10 days  Procedure date & time: 11/26/20

## 2020-11-09 NOTE — Telephone Encounter (Signed)
Patient needs trilyte 

## 2020-11-09 NOTE — Telephone Encounter (Signed)
Done

## 2020-11-24 ENCOUNTER — Other Ambulatory Visit (HOSPITAL_COMMUNITY)
Admission: RE | Admit: 2020-11-24 | Discharge: 2020-11-24 | Disposition: A | Discharge status: Home / Self Care | Payer: Medicare Other | Source: Ambulatory Visit | Attending: Internal Medicine | Admitting: Internal Medicine

## 2020-11-24 ENCOUNTER — Other Ambulatory Visit: Payer: Self-pay

## 2020-11-24 DIAGNOSIS — Z01812 Encounter for preprocedural laboratory examination: Secondary | ICD-10-CM | POA: Diagnosis not present

## 2020-11-24 DIAGNOSIS — Z20822 Contact with and (suspected) exposure to covid-19: Secondary | ICD-10-CM | POA: Diagnosis not present

## 2020-11-25 LAB — SARS CORONAVIRUS 2 (TAT 6-24 HRS): SARS Coronavirus 2: NEGATIVE

## 2020-11-26 ENCOUNTER — Encounter (HOSPITAL_COMMUNITY): Payer: Self-pay | Admitting: Internal Medicine

## 2020-11-26 ENCOUNTER — Ambulatory Visit (HOSPITAL_COMMUNITY)
Admission: RE | Admit: 2020-11-26 | Discharge: 2020-11-26 | Disposition: A | Discharge status: Home / Self Care | Payer: Medicare Other | Attending: Internal Medicine | Admitting: Internal Medicine

## 2020-11-26 ENCOUNTER — Other Ambulatory Visit: Payer: Self-pay

## 2020-11-26 ENCOUNTER — Encounter (HOSPITAL_COMMUNITY)
Admission: RE | Disposition: A | Discharge status: Home / Self Care | Payer: Self-pay | Source: Home / Self Care | Attending: Internal Medicine

## 2020-11-26 DIAGNOSIS — Z86010 Personal history of colonic polyps: Secondary | ICD-10-CM | POA: Diagnosis not present

## 2020-11-26 DIAGNOSIS — Z96652 Presence of left artificial knee joint: Secondary | ICD-10-CM | POA: Insufficient documentation

## 2020-11-26 DIAGNOSIS — K5730 Diverticulosis of large intestine without perforation or abscess without bleeding: Secondary | ICD-10-CM | POA: Insufficient documentation

## 2020-11-26 DIAGNOSIS — K6389 Other specified diseases of intestine: Secondary | ICD-10-CM | POA: Insufficient documentation

## 2020-11-26 DIAGNOSIS — Z1211 Encounter for screening for malignant neoplasm of colon: Secondary | ICD-10-CM | POA: Diagnosis not present

## 2020-11-26 DIAGNOSIS — Z853 Personal history of malignant neoplasm of breast: Secondary | ICD-10-CM | POA: Insufficient documentation

## 2020-11-26 DIAGNOSIS — Z96611 Presence of right artificial shoulder joint: Secondary | ICD-10-CM | POA: Insufficient documentation

## 2020-11-26 DIAGNOSIS — Z91048 Other nonmedicinal substance allergy status: Secondary | ICD-10-CM | POA: Diagnosis not present

## 2020-11-26 DIAGNOSIS — Z79899 Other long term (current) drug therapy: Secondary | ICD-10-CM | POA: Insufficient documentation

## 2020-11-26 DIAGNOSIS — D122 Benign neoplasm of ascending colon: Secondary | ICD-10-CM | POA: Diagnosis not present

## 2020-11-26 DIAGNOSIS — Z09 Encounter for follow-up examination after completed treatment for conditions other than malignant neoplasm: Secondary | ICD-10-CM | POA: Diagnosis not present

## 2020-11-26 DIAGNOSIS — Z887 Allergy status to serum and vaccine status: Secondary | ICD-10-CM | POA: Diagnosis not present

## 2020-11-26 DIAGNOSIS — Z882 Allergy status to sulfonamides status: Secondary | ICD-10-CM | POA: Insufficient documentation

## 2020-11-26 HISTORY — PX: COLONOSCOPY: SHX5424

## 2020-11-26 HISTORY — PX: POLYPECTOMY: SHX5525

## 2020-11-26 LAB — HM COLONOSCOPY

## 2020-11-26 SURGERY — COLONOSCOPY
Anesthesia: Moderate Sedation

## 2020-11-26 MED ORDER — MEPERIDINE HCL 50 MG/ML IJ SOLN
INTRAMUSCULAR | Status: AC
  Filled 2020-11-26: qty 1

## 2020-11-26 MED ORDER — STERILE WATER FOR IRRIGATION IR SOLN: Status: DC | PRN

## 2020-11-26 MED ORDER — 0.9 % SODIUM CHLORIDE (POUR BTL) OPTIME
1000.0000 mL | Freq: Once | TOPICAL | Status: AC
  Administered 2020-11-26: 1000 mL
  Filled 2020-11-26: qty 1000

## 2020-11-26 MED ORDER — MIDAZOLAM HCL 5 MG/5ML IJ SOLN
INTRAMUSCULAR | Status: DC | PRN
  Administered 2020-11-26 (×5): 2 mg via INTRAVENOUS

## 2020-11-26 MED ORDER — MEPERIDINE HCL 50 MG/ML IJ SOLN
INTRAMUSCULAR | Status: DC | PRN
  Administered 2020-11-26 (×3): 25 mg via INTRAVENOUS

## 2020-11-26 MED ORDER — MIDAZOLAM HCL 5 MG/5ML IJ SOLN
INTRAMUSCULAR | Status: AC
  Filled 2020-11-26: qty 10

## 2020-11-26 NOTE — Discharge Instructions (Signed)
No aspirin or NSAIDs for 24 hours. Resume usual medications as before. High-fiber diet. No driving for 24 hours. Physician will call with biopsy results.   Colonoscopy, Adult, Care After This sheet gives you information about how to care for yourself after your procedure. Your doctor may also give you more specific instructions. If you have problems or questions, call your doctor. What can I expect after the procedure? After the procedure, it is common to have:  A small amount of blood in your poop (stool) for 24 hours.  Some gas.  Mild cramping or bloating in your belly (abdomen). Follow these instructions at home: Eating and drinking  Drink enough fluid to keep your pee (urine) pale yellow.  Follow instructions from your doctor about what you cannot eat or drink.  Return to your normal diet as told by your doctor. Avoid heavy or fried foods that are hard to digest.   Activity  Rest as told by your doctor.  Do not sit for a long time without moving. Get up to take short walks every 1-2 hours. This is important. Ask for help if you feel weak or unsteady.  Return to your normal activities as told by your doctor. Ask your doctor what activities are safe for you. To help cramping and bloating:  Try walking around.  Put heat on your belly as told by your doctor. Use the heat source that your doctor recommends, such as a moist heat pack or a heating pad. ? Put a towel between your skin and the heat source. ? Leave the heat on for 20-30 minutes. ? Remove the heat if your skin turns bright red. This is very important if you are unable to feel pain, heat, or cold. You may have a greater risk of getting burned.   General instructions  If you were given a medicine to help you relax (sedative) during your procedure, it can affect you for many hours. Do not drive or use machinery until your doctor says that it is safe.  For the first 24 hours after the procedure: ? Do not sign  important documents. ? Do not drink alcohol. ? Do your daily activities more slowly than normal. ? Eat foods that are soft and easy to digest.  Take over-the-counter or prescription medicines only as told by your doctor.  Keep all follow-up visits as told by your doctor. This is important. Contact a doctor if:  You have blood in your poop 2-3 days after the procedure. Get help right away if:  You have more than a small amount of blood in your poop.  You see large clumps of tissue (blood clots) in your poop.  Your belly is swollen.  You feel like you may vomit (nauseous).  You vomit.  You have a fever.  You have belly pain that gets worse, and medicine does not help your pain. Summary  After the procedure, it is common to have a small amount of blood in your poop. You may also have mild cramping and bloating in your belly.  If you were given a medicine to help you relax (sedative) during your procedure, it can affect you for many hours. Do not drive or use machinery until your doctor says that it is safe.  Get help right away if you have a lot of blood in your poop, feel like you may vomit, have a fever, or have more belly pain. This information is not intended to replace advice given to you by your  health care provider. Make sure you discuss any questions you have with your health care provider. Document Revised: 05/24/2019 Document Reviewed: 02/11/2019 Elsevier Patient Education  2021 [employer].  Diverticulosis  Diverticulosis is a condition that develops when small pouches (diverticula) form in the wall of the large intestine (colon). The colon is where water is absorbed and stool (feces) is formed. The pouches form when the inside layer of the colon pushes through weak spots in the outer layers of the colon. You may have a few pouches or many of them. The pouches usually do not cause problems unless they become inflamed or infected. When this happens, the condition is  called diverticulitis. What are the causes? The cause of this condition is not known. What increases the risk? The following factors may make you more likely to develop this condition:  Being older than age 86. Your risk for this condition increases with age. Diverticulosis is rare among people younger than age 71. By age 25, many people have it.  Eating a low-fiber diet.  Having frequent constipation.  Being overweight.  Not getting enough exercise.  Smoking.  Taking over-the-counter pain medicines, like aspirin and ibuprofen.  Having a family history of diverticulosis. What are the signs or symptoms? In most people, there are no symptoms of this condition. If you do have symptoms, they may include:  Bloating.  Cramps in the abdomen.  Constipation or diarrhea.  Pain in the lower left side of the abdomen. How is this diagnosed? Because diverticulosis usually has no symptoms, it is most often diagnosed during an exam for other colon problems. The condition may be diagnosed by:  Using a flexible scope to examine the colon (colonoscopy).  Taking an X-ray of the colon after dye has been put into the colon (barium enema).  Having a CT scan. How is this treated? You may not need treatment for this condition. Your health care provider may recommend treatment to prevent problems. You may need treatment if you have symptoms or if you previously had diverticulitis. Treatment may include:  Eating a high-fiber diet.  Taking a fiber supplement.  Taking a live bacteria supplement (probiotic).  Taking medicine to relax your colon.   Follow these instructions at home: Medicines  Take over-the-counter and prescription medicines only as told by your health care provider.  If told by your health care provider, take a fiber supplement or probiotic. Constipation prevention Your condition may cause constipation. To prevent or treat constipation, you may need to:  Drink enough  fluid to keep your urine pale yellow.  Take over-the-counter or prescription medicines.  Eat foods that are high in fiber, such as beans, whole grains, and fresh fruits and vegetables.  Limit foods that are high in fat and processed sugars, such as fried or sweet foods.   General instructions  Try not to strain when you have a bowel movement.  Keep all follow-up visits as told by your health care provider. This is important. Contact a health care provider if you:  Have pain in your abdomen.  Have bloating.  Have cramps.  Have not had a bowel movement in 3 days. Get help right away if:  Your pain gets worse.  Your bloating becomes very bad.  You have a fever or chills, and your symptoms suddenly get worse.  You vomit.  You have bowel movements that are bloody or black.  You have bleeding from your rectum. Summary  Diverticulosis is a condition that develops when small pouches (  diverticula) form in the wall of the large intestine (colon).  You may have a few pouches or many of them.  This condition is most often diagnosed during an exam for other colon problems.  Treatment may include increasing the fiber in your diet, taking supplements, or taking medicines. This information is not intended to replace advice given to you by your health care provider. Make sure you discuss any questions you have with your health care provider. Document Revised: 02/14/2019 Document Reviewed: 02/14/2019 Elsevier Patient Education  [employer].  Colon Polyps  Colon polyps are tissue growths inside the colon, which is part of the large intestine. They are one of the types of polyps that can grow in the body. A polyp may be a round bump or a mushroom-shaped growth. You could have one polyp or more than one. Most colon polyps are noncancerous (benign). However, some colon polyps can become cancerous over time. Finding and removing the polyps early can help prevent this. What are the  causes? The exact cause of colon polyps is not known. What increases the risk? The following factors may make you more likely to develop this condition:  Having a family history of colorectal cancer or colon polyps.  Being older than 73 years of age.  Being younger than 74 years of age and having a significant family history of colorectal cancer or colon polyps or a genetic condition that puts you at higher risk of getting colon polyps.  Having inflammatory bowel disease, such as ulcerative colitis or Crohn's disease.  Having certain conditions passed from parent to child (hereditary conditions), such as: ? Familial adenomatous polyposis (FAP). ? Lynch syndrome. ? Turcot syndrome. ? Peutz-Jeghers syndrome. ? MUTYH-associated polyposis (MAP).  Being overweight.  Certain lifestyle factors. These include smoking cigarettes, drinking too much alcohol, not getting enough exercise, and eating a diet that is high in fat and red meat and low in fiber.  Having had childhood cancer that was treated with radiation of the abdomen. What are the signs or symptoms? Many times, there are no symptoms. If you have symptoms, they may include:  Blood coming from the rectum during a bowel movement.  Blood in the stool (feces). The blood may be bright red or very dark in color.  Pain in the abdomen.  A change in bowel habits, such as constipation or diarrhea. How is this diagnosed? This condition is diagnosed with a colonoscopy. This is a procedure in which a lighted, flexible scope is inserted into the opening between the buttocks (anus) and then passed into the colon to examine the area. Polyps are sometimes found when a colonoscopy is done as part of routine cancer screening tests. How is this treated? This condition is treated by removing any polyps that are found. Most polyps can be removed during a colonoscopy. Those polyps will then be tested for cancer. Additional treatment may be needed  depending on the results of testing. Follow these instructions at home: Eating and drinking  Eat foods that are high in fiber, such as fruits, vegetables, and whole grains.  Eat foods that are high in calcium and vitamin D, such as milk, cheese, yogurt, eggs, liver, fish, and broccoli.  Limit foods that are high in fat, such as fried foods and desserts.  Limit the amount of red meat, precooked or cured meat, or other processed meat that you eat, such as hot dogs, sausages, bacon, or meat loaves.  Limit sugary drinks.   Lifestyle  Maintain a  healthy weight, or lose weight if recommended by your health care provider.  Exercise every day or as told by your health care provider.  Do not use any products that contain nicotine or tobacco, such as cigarettes, e-cigarettes, and chewing tobacco. If you need help quitting, ask your health care provider.  Do not drink alcohol if: ? Your health care provider tells you not to drink. ? You are pregnant, may be pregnant, or are planning to become pregnant.  If you drink alcohol: ? Limit how much you use to:  0-1 drink a day for women.  0-2 drinks a day for men. ? Know how much alcohol is in your drink. In the [place] one drink equals one 12 oz bottle of beer (355 mL), one 5 oz glass of wine (148 mL), or one 1 oz glass of hard liquor (44 mL). General instructions  Take over-the-counter and prescription medicines only as told by your health care provider.  Keep all follow-up visits. This is important. This includes having regularly scheduled colonoscopies. Talk to your health care provider about when you need a colonoscopy. Contact a health care provider if:  You have new or worsening bleeding during a bowel movement.  You have new or increased blood in your stool.  You have a change in bowel habits.  You lose weight for no known reason. Summary  Colon polyps are tissue growths inside the colon, which is part of the large intestine.  They are one type of polyp that can grow in the body.  Most colon polyps are noncancerous (benign), but some can become cancerous over time.  This condition is diagnosed with a colonoscopy.  This condition is treated by removing any polyps that are found. Most polyps can be removed during a colonoscopy. This information is not intended to replace advice given to you by your health care provider. Make sure you discuss any questions you have with your health care provider. Document Revised: 11/06/2019 Document Reviewed: 11/06/2019 Elsevier Patient Education  2021 [employer].

## 2020-11-26 NOTE — H&P (Signed)
Caitlin [name] is an 75 y.o. female.   Chief Complaint: Patient is here for colonoscopy. HPI: Patient is 76 year old Caucasian female who was at for surveillance colonoscopy.  Last exam was in February 2015 with removal of 2 small polyps.  One polyp was a tubular adenoma and the other polyp was sessile serrated polyp.  She is advised to return in 5 years.  Procedure has been delayed because of COVID.  She has no GI symptoms.  She denies abdominal pain diarrhea constipation rectal bleeding. Family history is negative for CRC. Personal history significant for breast carcinoma.  She states she had genetic testing and the tests were negative.  Past Medical History:  Diagnosis Date  . Arthritis   . Asthma   . Atypical mole 04/22/2020   mod-left lower back  . Breast cancer ([place])   . Breast disorder    cancer  . Breast mass    benign  . Cellulitis   .  09/09/2020  .  09/09/2020  .  09/09/2020  . GERD (gastroesophageal reflux disease)   . Heart murmur    AS CHILD  OUTGREW IT  . History of hiatal hernia   . Hyperlipidemia   . Hypertension   . Impaired fasting glucose   . Rectocele   . Varicose vein of leg     Past Surgical History:  Procedure Laterality Date  . BREAST LUMPECTOMY WITH RADIOACTIVE SEED AND SENTINEL LYMPH NODE BIOPSY Right 11/11/2015   Procedure: RIGHT BREAST RADIOACTIVE SEED GUIDED  LUMPECTOMY AND RADIOACTIVE SEED TARGETED SENTINEL LYMPH NODE BIOPSY;  Surgeon: Stark Klein, MD;  Location: [facility];  Service: General;  Laterality: Right;  . BREAST SURGERY Left    breast biopies x4  . CHOLECYSTECTOMY  09/08/2010  . COLONOSCOPY    . COLONOSCOPY N/A 09/25/2013   Procedure: COLONOSCOPY;  Surgeon: Rogene Houston, MD;  [facility];  Service: Endoscopy;  Laterality: N/A;  100-moved to 1200 Ann to notify pt  . HERNIA REPAIR  12/24/2010, 12-2014   TOTAL 4  . LAPAROSCOPIC GASTRIC SLEEVE RESECTION  11-2013   at [facility]   2007/2008  . REVERSE SHOULDER ARTHROPLASTY Right 03/03/2020   Procedure: RIGHT REVERSE SHOULDER ARTHROPLASTY;  Surgeon: Meredith Pel, MD;  Location: [facility];  Service: Orthopedics;  Laterality: Right;  . TONSILLECTOMY  1968  . TOTAL KNEE ARTHROPLASTY Left 05/02/2017  . TOTAL KNEE ARTHROPLASTY Left 05/02/2017   Procedure: LEFT TOTAL KNEE ARTHROPLASTY;  Surgeon: [name], MD;  Location: [facility];  Service: Orthopedics;  Laterality: Left;  . UPPER GASTROINTESTINAL ENDOSCOPY    . VAGINAL HYSTERECTOMY  1980s    Family History  Problem Relation Age of Onset  . Rectal cancer Father        dx 47s  . Prostate cancer Father        dx 39s  . Dementia Mother   . Osteoporosis Mother   . Breast cancer Daughter 48       triple negative  . Healthy Son   . Pancreatic cancer Paternal Grandfather        dx 50s-60s  . Heart disease Paternal Grandmother   . Heart disease Maternal Grandfather   . Breast cancer Maternal Grandmother        dx 73s  . Scoliosis Sister   . Cancer Sister        face/neck ?skin   Social History:  reports that she has never smoked.  She has never used smokeless tobacco. She reports that she does not drink alcohol and does not use drugs.  Allergies:  Allergies  Allergen Reactions  . Horse-Derived Products     Reaction as a child "heart stopped"  . Sulfa Antibiotics Hives  . Sulfamethoxazole-Trimethoprim Rash  . Tetanus Toxoid Adsorbed Palpitations    As a child    Medications Prior to Admission  Medication Sig Dispense Refill  . anastrozole (ARIMIDEX) 1 MG tablet Take 1 tablet (1 mg total) by mouth daily. (Patient taking differently: Take 1 mg by mouth in the morning.) 90 tablet 4  . Budeson-Glycopyrrol-Formoterol (BREZTRI AEROSPHERE) 160-9-4.8 MCG/ACT AERO Inhale 1-2 puffs into the lungs 2 (two) times daily. (Patient taking differently: Inhale 1-2 puffs into the lungs 2 (two) times daily as needed (congestion.).) 10.7 g 11  . calcium citrate-vitamin D  (CALCIUM CITRATE CHEWY BITE) 500-500 MG-UNIT chewable tablet Chew 1 tablet by mouth in the morning. Bariatric - Calcium Citrate Chewy Bites 500mg  w/D3    . Cyanocobalamin (VITAMIN B-12) 2500 MCG SUBL Place 2,500 mcg under the tongue in the morning.    [name] dextromethorphan-guaiFENesin (MUCINEX DM) 30-600 MG 12hr tablet Take 1 tablet by mouth at bedtime as needed (cough/congestion).    [name] docusate sodium (COLACE) 100 MG capsule Take 100 mg by mouth at bedtime.     [name] esomeprazole (NEXIUM) 20 MG capsule Take 40 mg by mouth daily before breakfast.     . famotidine (PEPCID) 20 MG tablet Take 20 mg by mouth daily in the afternoon.    . hydrochlorothiazide (HYDRODIURIL) 25 MG tablet Take 25 mg by mouth in the morning.    [name] IRON-VITAMIN C PO Take 1 tablet by mouth at bedtime. (Chewable) Celebrate Vitamins Iron+C    . losartan (COZAAR) 100 MG tablet Take 100 mg by mouth in the morning.    . magnesium oxide (MAG-OX) 400 MG tablet Take 400 mg by mouth daily at 6 PM. (1700)    . Melatonin 10 MG TABS Take 10-20 mg by mouth at bedtime as needed (sleep).    . Menaquinone-7 (VITAMIN K2) 100 MCG CAPS Take 100 mcg by mouth daily in the afternoon.    . Multiple Vitamins-Minerals (CELEBRATE MULTI-COMPLETE 60 PO) Take 1 capsule by mouth in the morning and at bedtime.    . polyethylene glycol-electrolytes (NULYTELY) 420 g solution Take 4,000 mLs by mouth once.    . pravastatin (PRAVACHOL) 40 MG tablet Take 40 mg by mouth at bedtime.    . Probiotic Product (PROBIOTIC DAILY PO) Take 1 capsule by mouth daily in the afternoon.    . Vitamin D3 (VITAMIN D) 25 MCG tablet Take 1,000 Units by mouth daily.    . Fluticasone-Umeclidin-Vilant (TRELEGY ELLIPTA) 200-62.5-25 MCG/INH AEPB Inhale 1 puff into the lungs daily. (Patient not taking: No sig reported) 2 each 0    No results found for this or any previous visit (from the past 48 hour(s)). No results found.  Review of Systems  Blood pressure (!) 143/84, pulse 92, temperature  98.3 F (36.8 C), temperature source Oral, resp. rate (!) 22, height 5\' 2"  (1.575 m), SpO2 94 %. Physical Exam HENT:     Mouth/Throat:     Mouth: Mucous membranes are moist.     Pharynx: Oropharynx is clear.  Eyes:     General: No scleral icterus.    Conjunctiva/sclera: Conjunctivae normal.  Cardiovascular:     Rate and Rhythm: Normal rate and regular rhythm.     Heart sounds:  Normal heart sounds. No murmur heard.   Pulmonary:     Effort: Pulmonary effort is normal.     Breath sounds: Normal breath sounds.  Abdominal:     Comments: Abdomen is full.  She has long horizontal scar across lower abdomen.  On palpation is soft and nontender with organomegaly or masses.  Musculoskeletal:     Cervical back: Neck supple.     Comments: She has scar along medial aspect of left leg secondary to healed ulcer.  Lymphadenopathy:     Cervical: No cervical adenopathy.  Skin:    General: Skin is warm and dry.  Neurological:     Mental Status: She is alert.      Assessment/Plan  History of colonic polyps. Surveillance colonoscopy.  Hildred Laser, MD 11/26/2020, 10:33 AM

## 2020-11-26 NOTE — Op Note (Signed)
[facility] Patient Name: Caitlin [name] Procedure Date: 11/26/2020 10:15 AM MRN: [id number] Date of Birth: August 21, 1945 Attending MD: Hildred Laser , MD CSN: [id number] Age: 75 Admit Type: Outpatient Procedure:                Colonoscopy Indications:              High risk colon cancer surveillance: Personal                            history of colonic polyps Providers:                Hildred Laser, MD, Otis Peak B. Sharon Seller, RN, Nelma Rothman, Technician Referring MD:             Sharilyn Sites, MD Medicines:                Meperidine 75 mg IV, Midazolam 10 mg IV Complications:            No immediate complications. Estimated Blood Loss:     Estimated blood loss was minimal. Procedure:                Pre-Anesthesia Assessment:                           - Prior to the procedure, a History and Physical                            was performed, and patient medications and                            allergies were reviewed. The patient's tolerance of                            previous anesthesia was also reviewed. The risks                            and benefits of the procedure and the sedation                            options and risks were discussed with the patient.                            All questions were answered, and informed consent                            was obtained. Prior Anticoagulants: The patient has                            taken no previous anticoagulant or antiplatelet                            agents except for NSAID medication. ASA Grade  Assessment: II - A patient with mild systemic                            disease. After reviewing the risks and benefits,                            the patient was deemed in satisfactory condition to                            undergo the procedure.                           After obtaining informed consent, the colonoscope                            was passed under  direct vision. Throughout the                            procedure, the patient's blood pressure, pulse, and                            oxygen saturations were monitored continuously. The                            [id number]-[id number] ([id number]) scope was introduced through                            the anus and advanced to the the cecum, identified                            by appendiceal orifice and ileocecal valve. The                            colonoscopy was technically difficult and complex                            due to significant looping. Successful completion                            of the procedure was aided by using manual                            pressure, withdrawing and reinserting the scope and                            scope guide. The patient tolerated the procedure                            well. The quality of the bowel preparation was                            adequate. The ileocecal valve, appendiceal orifice,  and rectum were photographed. Scope In: 10:46:56 AM Scope Out: 11:26:09 AM Scope Withdrawal Time: 0 hours 16 minutes 22 seconds  Total Procedure Duration: 0 hours 39 minutes 13 seconds  Findings:      The perianal and digital rectal examinations were normal.      A few petechiae were found in the cecum.      A small polyp was found in the ascending colon. The polyp was sessile.       The polyp was removed with a cold snare. Resection and retrieval were       complete.      Multiple small and large-mouthed diverticula were found in the sigmoid       colon and descending colon. Impression:               - Petechia(e) in the cecum.                           - One small polyp in the ascending colon, removed                            with a cold snare. Resected and retrieved.                           - Diverticulosis in the sigmoid colon and in the                            descending colon. Moderate Sedation:      Moderate  (conscious) sedation was administered by the endoscopy nurse       and supervised by the endoscopist. The following parameters were       monitored: oxygen saturation, heart rate, blood pressure, CO2       capnography and response to care. Total physician intraservice time was       45 minutes. Recommendation:           - Patient has a contact number available for                            emergencies. The signs and symptoms of potential                            delayed complications were discussed with the                            patient. Return to normal activities tomorrow.                            Written discharge instructions were provided to the                            patient.                           - High fiber diet today.                           - Continue present medications.                           -  No aspirin, ibuprofen, naproxen, or other                            non-steroidal anti-inflammatory drugs for 1 day.                           - Await pathology results.                           - Await pathology results.                           - Repeat colonoscopy is recommended. The                            colonoscopy date will be determined after pathology                            results from today's exam become available for                            review. Procedure Code(s):        --- Professional ---                           [phone], Colonoscopy, flexible; with removal of                            tumor(s), polyp(s), or other lesion(s) by snare                            technique                           99153, Moderate sedation; each additional 15                            minutes intraservice time                           99153, Moderate sedation; each additional 15                            minutes intraservice time                           G0500, Moderate sedation services provided by the                            same physician  or other qualified health care                            professional performing a gastrointestinal                            endoscopic service that sedation supports,  requiring the presence of an independent trained                            observer to assist in the monitoring of the                            patient's level of consciousness and physiological                            status; initial 15 minutes of intra-service time;                            patient age 30 years or older (additional time may                            be reported with [phone], as appropriate) Diagnosis Code(s):        --- Professional ---                           Z86.010, Personal history of colonic polyps                           K63.89, Other specified diseases of intestine                           K63.5, Polyp of colon                           K57.30, Diverticulosis of large intestine without                            perforation or abscess without bleeding CPT copyright 2019 American [employer] Association. All rights reserved. The codes documented in this report are preliminary and upon coder review may  be revised to meet current compliance requirements. Hildred Laser, MD Hildred Laser, MD 11/26/2020 11:39:16 AM This report has been signed electronically. Number of Addenda: 0

## 2020-11-27 LAB — SURGICAL PATHOLOGY

## 2020-11-28 DIAGNOSIS — I10 Essential (primary) hypertension: Secondary | ICD-10-CM | POA: Diagnosis not present

## 2020-11-28 DIAGNOSIS — E7849 Other hyperlipidemia: Secondary | ICD-10-CM | POA: Diagnosis not present

## 2020-11-30 ENCOUNTER — Encounter (HOSPITAL_COMMUNITY): Payer: Self-pay | Admitting: Internal Medicine

## 2020-12-02 ENCOUNTER — Other Ambulatory Visit (HOSPITAL_COMMUNITY): Payer: Self-pay | Admitting: Hematology

## 2020-12-02 DIAGNOSIS — Z1231 Encounter for screening mammogram for malignant neoplasm of breast: Secondary | ICD-10-CM

## 2020-12-04 ENCOUNTER — Encounter (INDEPENDENT_AMBULATORY_CARE_PROVIDER_SITE_OTHER): Payer: Self-pay | Admitting: *Deleted

## 2020-12-09 ENCOUNTER — Other Ambulatory Visit (HOSPITAL_COMMUNITY): Payer: Self-pay | Admitting: Hematology

## 2020-12-09 ENCOUNTER — Inpatient Hospital Stay (HOSPITAL_COMMUNITY): Payer: Medicare Other

## 2020-12-09 ENCOUNTER — Ambulatory Visit (HOSPITAL_COMMUNITY): Admission: RE | Admit: 2020-12-09 | Payer: Medicare Other | Source: Ambulatory Visit

## 2020-12-09 DIAGNOSIS — Z780 Asymptomatic menopausal state: Secondary | ICD-10-CM

## 2020-12-09 DIAGNOSIS — M8580 Other specified disorders of bone density and structure, unspecified site: Secondary | ICD-10-CM

## 2020-12-09 DIAGNOSIS — Z1231 Encounter for screening mammogram for malignant neoplasm of breast: Secondary | ICD-10-CM

## 2020-12-11 ENCOUNTER — Other Ambulatory Visit (HOSPITAL_COMMUNITY): Payer: Self-pay | Admitting: *Deleted

## 2020-12-11 ENCOUNTER — Ambulatory Visit (HOSPITAL_COMMUNITY): Payer: Medicare Other | Admitting: Nurse Practitioner

## 2020-12-11 DIAGNOSIS — C50211 Malignant neoplasm of upper-inner quadrant of right female breast: Secondary | ICD-10-CM

## 2020-12-11 DIAGNOSIS — D513 Other dietary vitamin B12 deficiency anemia: Secondary | ICD-10-CM

## 2020-12-11 DIAGNOSIS — E559 Vitamin D deficiency, unspecified: Secondary | ICD-10-CM

## 2020-12-14 ENCOUNTER — Inpatient Hospital Stay (HOSPITAL_COMMUNITY): Payer: Medicare Other | Attending: Hematology

## 2020-12-14 ENCOUNTER — Other Ambulatory Visit: Payer: Self-pay

## 2020-12-14 ENCOUNTER — Other Ambulatory Visit (HOSPITAL_COMMUNITY): Payer: Self-pay | Admitting: Hematology

## 2020-12-14 ENCOUNTER — Ambulatory Visit (HOSPITAL_COMMUNITY)
Admission: RE | Admit: 2020-12-14 | Discharge: 2020-12-14 | Disposition: A | Discharge status: Home / Self Care | Payer: Medicare Other | Source: Ambulatory Visit | Attending: Hematology | Admitting: Hematology

## 2020-12-14 DIAGNOSIS — D513 Other dietary vitamin B12 deficiency anemia: Secondary | ICD-10-CM

## 2020-12-14 DIAGNOSIS — R928 Other abnormal and inconclusive findings on diagnostic imaging of breast: Secondary | ICD-10-CM

## 2020-12-14 DIAGNOSIS — Z780 Asymptomatic menopausal state: Secondary | ICD-10-CM | POA: Insufficient documentation

## 2020-12-14 DIAGNOSIS — M8580 Other specified disorders of bone density and structure, unspecified site: Secondary | ICD-10-CM | POA: Insufficient documentation

## 2020-12-14 DIAGNOSIS — C50211 Malignant neoplasm of upper-inner quadrant of right female breast: Secondary | ICD-10-CM | POA: Insufficient documentation

## 2020-12-14 DIAGNOSIS — Z923 Personal history of irradiation: Secondary | ICD-10-CM | POA: Insufficient documentation

## 2020-12-14 DIAGNOSIS — Z170 Estrogen receptor positive status [ER+]: Secondary | ICD-10-CM | POA: Diagnosis not present

## 2020-12-14 DIAGNOSIS — Z800 Family history of malignant neoplasm of digestive organs: Secondary | ICD-10-CM | POA: Diagnosis not present

## 2020-12-14 DIAGNOSIS — N6325 Unspecified lump in the left breast, overlapping quadrants: Secondary | ICD-10-CM | POA: Insufficient documentation

## 2020-12-14 DIAGNOSIS — B351 Tinea unguium: Secondary | ICD-10-CM | POA: Diagnosis not present

## 2020-12-14 DIAGNOSIS — Z8042 Family history of malignant neoplasm of prostate: Secondary | ICD-10-CM | POA: Insufficient documentation

## 2020-12-14 DIAGNOSIS — Z1231 Encounter for screening mammogram for malignant neoplasm of breast: Secondary | ICD-10-CM | POA: Insufficient documentation

## 2020-12-14 DIAGNOSIS — Z808 Family history of malignant neoplasm of other organs or systems: Secondary | ICD-10-CM | POA: Insufficient documentation

## 2020-12-14 DIAGNOSIS — E559 Vitamin D deficiency, unspecified: Secondary | ICD-10-CM

## 2020-12-14 DIAGNOSIS — Z6837 Body mass index (BMI) 37.0-37.9, adult: Secondary | ICD-10-CM | POA: Diagnosis not present

## 2020-12-14 DIAGNOSIS — Z79811 Long term (current) use of aromatase inhibitors: Secondary | ICD-10-CM | POA: Insufficient documentation

## 2020-12-14 DIAGNOSIS — M85852 Other specified disorders of bone density and structure, left thigh: Secondary | ICD-10-CM | POA: Diagnosis not present

## 2020-12-14 LAB — CBC WITH DIFFERENTIAL/PLATELET
Abs Immature Granulocytes: 0.01 10*3/uL (ref 0.00–0.07)
Basophils Absolute: 0 10*3/uL (ref 0.0–0.1)
Basophils Relative: 1 %
Eosinophils Absolute: 0.3 10*3/uL (ref 0.0–0.5)
Eosinophils Relative: 8 %
HCT: 34.7 % — ABNORMAL LOW (ref 36.0–46.0)
Hemoglobin: 11.8 g/dL — ABNORMAL LOW (ref 12.0–15.0)
Immature Granulocytes: 0 %
Lymphocytes Relative: 25 %
Lymphs Abs: 1.1 10*3/uL (ref 0.7–4.0)
MCH: 34.1 pg — ABNORMAL HIGH (ref 26.0–34.0)
MCHC: 34 g/dL (ref 30.0–36.0)
MCV: 100.3 fL — ABNORMAL HIGH (ref 80.0–100.0)
Monocytes Absolute: 0.5 10*3/uL (ref 0.1–1.0)
Monocytes Relative: 13 %
Neutro Abs: 2.3 10*3/uL (ref 1.7–7.7)
Neutrophils Relative %: 53 %
Platelets: 229 10*3/uL (ref 150–400)
RBC: 3.46 MIL/uL — ABNORMAL LOW (ref 3.87–5.11)
RDW: 12.2 % (ref 11.5–15.5)
WBC: 4.3 10*3/uL (ref 4.0–10.5)
nRBC: 0 % (ref 0.0–0.2)

## 2020-12-14 LAB — COMPREHENSIVE METABOLIC PANEL
ALT: 25 U/L (ref 0–44)
AST: 36 U/L (ref 15–41)
Albumin: 3.7 g/dL (ref 3.5–5.0)
Alkaline Phosphatase: 107 U/L (ref 38–126)
Anion gap: 8 (ref 5–15)
BUN: 8 mg/dL (ref 8–23)
CO2: 26 mmol/L (ref 22–32)
Calcium: 9.2 mg/dL (ref 8.9–10.3)
Chloride: 93 mmol/L — ABNORMAL LOW (ref 98–111)
Creatinine, Ser: 0.58 mg/dL (ref 0.44–1.00)
GFR, Estimated: >60 mL/min (ref >60)
Glucose, Bld: 109 mg/dL — ABNORMAL HIGH (ref 70–99)
Potassium: 4.1 mmol/L (ref 3.5–5.1)
Sodium: 127 mmol/L — ABNORMAL LOW (ref 135–145)
Total Bilirubin: 0.8 mg/dL (ref 0.3–1.2)
Total Protein: 6.5 g/dL (ref 6.5–8.1)

## 2020-12-14 LAB — VITAMIN D 25 HYDROXY (VIT D DEFICIENCY, FRACTURES): Vit D, 25-Hydroxy: 72.09 ng/mL (ref 30–100)

## 2020-12-14 LAB — VITAMIN B12: Vitamin B-12: 1850 pg/mL — ABNORMAL HIGH (ref 180–914)

## 2020-12-14 LAB — LACTATE DEHYDROGENASE: LDH: 114 U/L (ref 98–192)

## 2020-12-15 ENCOUNTER — Ambulatory Visit (HOSPITAL_COMMUNITY): Payer: Medicare Other | Admitting: Hematology

## 2020-12-16 NOTE — Progress Notes (Signed)
[place] [address]  [address]   Patient Care Team: Sharilyn Sites, MD as PCP - [facility], [place], PA-C (Inactive) (Dermatology) Lavonna Monarch, MD as Consulting Physician (Dermatology)  SUMMARY OF ONCOLOGIC HISTORY: Oncology History  Breast cancer of upper-inner quadrant of right female breast ([place])  09/24/2014 Imaging   DEXA with normal bone density   10/06/2015 Imaging   Screening bilateral mammogram BI-RADS Category 0, R breast possible mass, L breast possible mass   10/20/2015 Imaging   Diagnostic B mammogram, R breast Ultrasound with suspicious mass in the R breast at the 1 o clock location 5 cm from the nipple, LN in the low R axillae with cortex nodularity   10/27/2015 Initial Biopsy   Ultrasound guided biopsy of R axillary LN, BENIGN. Ultrasound guided biopsy of R breast with invasive ductal carcinoma   11/04/2015 Initial Diagnosis   Breast cancer of upper-inner quadrant of right female breast ([place])   11/09/2015 Procedure   Radioactive seed localization R breast   11/11/2015 Surgery   R breast radioactive seed localized lumpectomy and seed targeted sentinel LN biopsy   11/11/2015 Pathology Results   Invasive ductal carcinoma Grade I/III spanning 1.1 cm, lobular neoplasia (LCIS) resection margins negative. 3 negative sentinel nodes ER (100%), PR (100%), Her2 neu negative pT1c, pN0   12/11/2015 Oncotype testing   Recurrence Score result 11, 10 year risk of distant recurrence with tamoxifen alone 8% (low-risk)   01/11/2016 - 02/04/2016 Radiation Therapy   Adjuvant breast radiation ([place]): 42.72 Gy in 16 fractions to the right breast   01/2016 -  Anti-estrogen oral therapy   Anastrazole 1 mg daily. Planned duration of therapy: 5 years.    10/06/2020 Genetic Testing   Negative hereditary cancer genetic testing: no pathogenic variants detected in [facility] Common Hereditary Cancers +RNA Panel.  The report date is October 06, 2020.   The Common Hereditary Cancers + RNA Panel offered by [facility] includes sequencing, deletion/duplication, and RNA testing of the following 47 genes: APC, ATM, AXIN2, BARD1, BMPR1A, BRCA1, BRCA2, BRIP1, CDH1, CDK4*, CDKN2A (p14ARF)*, CDKN2A (p16INK4a)*, CHEK2, CTNNA1, DICER1, EPCAM (Deletion/duplication testing only), GREM1 (promoter region deletion/duplication testing only), KIT, MEN1, MLH1, MSH2, MSH3, MSH6, MUTYH, NBN, NF1, NHTL1, PALB2, PDGFRA*, PMS2, POLD1, POLE, PTEN, RAD50, RAD51C, RAD51D, SDHB, SDHC, SDHD, SMAD4, SMARCA4. STK11, TP53, TSC1, TSC2, and VHL.  The following genes were evaluated for sequence changes only: SDHA and HOXB13 c.251G>A variant only.  RNA analysis is not performed for the * genes.       CHIEF COMPLIANT: Follow-up for breast cancer   INTERVAL HISTORY: Ms. AMMY LIENHARD is a 75 y.o. female here today for follow up of her breast cancer. Her last visit was on 12/10/2019.   Today she reports feeling well. She takes B-12 sublingually. Her daughter (age 39) was diagnosed with triple negative breast cancer. Her and her daughter had genetics testing which came back negative for the BRCA gene. She is planning to move to [facility] by next year.   REVIEW OF SYSTEMS:   Review of Systems  Constitutional: Negative for appetite change and fatigue.  All other systems reviewed and are negative.   I have reviewed the past medical history, past surgical history, social history and family history with the patient and they are unchanged from previous note.   ALLERGIES:   is allergic to horse-derived products, sulfa antibiotics, sulfamethoxazole-trimethoprim, and tetanus toxoid adsorbed.   MEDICATIONS:  Current Outpatient Medications  Medication Sig Dispense  Refill  . anastrozole (ARIMIDEX) 1 MG tablet Take 1 tablet (1 mg total) by mouth daily. (Patient taking differently: Take 1 mg by mouth in the morning.) 90 tablet 4  . Budeson-Glycopyrrol-Formoterol (BREZTRI  AEROSPHERE) 160-9-4.8 MCG/ACT AERO Inhale 1-2 puffs into the lungs 2 (two) times daily. (Patient taking differently: Inhale 1-2 puffs into the lungs 2 (two) times daily as needed (congestion.).) 10.7 g 11  . calcium citrate-vitamin D (CALCIUM CITRATE CHEWY BITE) 500-500 MG-UNIT chewable tablet Chew 1 tablet by mouth in the morning. Bariatric - Calcium Citrate Chewy Bites 564m w/D3    . Cyanocobalamin (VITAMIN B-12) 2500 MCG SUBL Place 2,500 mcg under the tongue in the morning.    .Marland Kitchendextromethorphan-guaiFENesin (MUCINEX DM) 30-600 MG 12hr tablet Take 1 tablet by mouth at bedtime as needed (cough/congestion).    .Marland Kitchendocusate sodium (COLACE) 100 MG capsule Take 100 mg by mouth at bedtime.     .Marland Kitchenesomeprazole (NEXIUM) 20 MG capsule Take 40 mg by mouth daily before breakfast.     . famotidine (PEPCID) 20 MG tablet Take 20 mg by mouth daily in the afternoon.    . hydrochlorothiazide (HYDRODIURIL) 25 MG tablet Take 25 mg by mouth in the morning.    .Marland KitchenIRON-VITAMIN C PO Take 1 tablet by mouth at bedtime. (Chewable) Celebrate Vitamins Iron+C    . losartan (COZAAR) 100 MG tablet Take 100 mg by mouth in the morning.    . magnesium oxide (MAG-OX) 400 MG tablet Take 400 mg by mouth daily at 6 PM. (1700)    . Melatonin 10 MG TABS Take 10-20 mg by mouth at bedtime as needed (sleep).    . Menaquinone-7 (VITAMIN K2) 100 MCG CAPS Take 100 mcg by mouth daily in the afternoon.    . Multiple Vitamins-Minerals (CELEBRATE MULTI-COMPLETE 60 PO) Take 1 capsule by mouth in the morning and at bedtime.    . pravastatin (PRAVACHOL) 40 MG tablet Take 40 mg by mouth at bedtime.    . Probiotic Product (PROBIOTIC DAILY PO) Take 1 capsule by mouth daily in the afternoon.    . Vitamin D3 (VITAMIN D) 25 MCG tablet Take 1,000 Units by mouth daily.     No current facility-administered medications for this visit.     PHYSICAL EXAMINATION: Performance status (ECOG): 1 - Symptomatic but completely ambulatory  There were no vitals  filed for this visit. Wt Readings from Last 3 Encounters:  07/15/20 215 lb (97.5 kg)  03/03/20 215 lb 2.7 oz (97.6 kg)  02/28/20 (!) 215 lb 4 oz (97.6 kg)   Physical Exam Vitals reviewed.  Constitutional:      Appearance: Normal appearance.  Cardiovascular:     Rate and Rhythm: Normal rate and regular rhythm.     Pulses: Normal pulses.     Heart sounds: Normal heart sounds.  Pulmonary:     Effort: Pulmonary effort is normal.     Breath sounds: Normal breath sounds.  Chest:  Breasts:     Right: No mass, skin change (UIQ lumpectomy scar well healed), axillary adenopathy or supraclavicular adenopathy.     Left: No mass, skin change, axillary adenopathy or supraclavicular adenopathy.    Abdominal:     Palpations: Abdomen is soft. There is no hepatomegaly, splenomegaly or mass.     Tenderness: There is no abdominal tenderness.  Musculoskeletal:     Right lower leg: Edema (1+) present.     Left lower leg: Edema (1+) present.  Lymphadenopathy:     Upper Body:  Right upper body: No supraclavicular, axillary or pectoral adenopathy.     Left upper body: No supraclavicular, axillary or pectoral adenopathy.  Neurological:     General: No focal deficit present.     Mental Status: She is alert and oriented to person, place, and time.  Psychiatric:        Mood and Affect: Mood normal.        Behavior: Behavior normal.     Breast Exam Chaperone: Thana Ates     LABORATORY DATA:  I have reviewed the data as listed CMP Latest Ref Rng & Units 12/14/2020 09/14/2020 02/28/2020  Glucose 70 - 99 mg/dL 109(H) 105(H) 106(H)  BUN 8 - 23 mg/dL 8 7(L) 8  Creatinine 0.44 - 1.00 mg/dL 0.58 0.59 0.63  Sodium 135 - 145 mmol/L 127(L) 128(L) 131(L)  Potassium 3.5 - 5.1 mmol/L 4.1 3.7 3.8  Chloride 98 - 111 mmol/L 93(L) 94(L) 93(L)  CO2 22 - 32 mmol/L 26 25 29   Calcium 8.9 - 10.3 mg/dL 9.2 9.3 9.8  Total Protein 6.5 - 8.1 g/dL 6.5 6.6 -  Total Bilirubin 0.3 - 1.2 mg/dL 0.8 0.8 -  Alkaline  Phos 38 - 126 U/L 107 121 -  AST 15 - 41 U/L 36 44(H) -  ALT 0 - 44 U/L 25 30 -   No results found for: [id number] Lab Results  Component Value Date   WBC 4.3 12/14/2020   HGB 11.8 (L) 12/14/2020   HCT 34.7 (L) 12/14/2020   MCV 100.3 (H) 12/14/2020   PLT 229 12/14/2020   NEUTROABS 2.3 12/14/2020    ASSESSMENT:  1.  Stage I right breast cancer: - Patient had her regular screening mammogram on 10/05/2015 which was BI-RADS Category 0 incomplete. - She had a diagnostic mammogram on 10/20/2015 which was BI-RADS Category 4 suspicious.  It showed a 5 mm right breast mass at the 1 o'clock position. -They set her up for an ultrasound guided biopsy of the mass on 10/27/2015.  It showed invasive ductal carcinoma and was ER/PR 100% positive.  HER-2 negative.  Ki-67 at 3%. - She had a lumpectomy and sentinel node biopsy with Dr. Barry Dienes on 11/11/2015. -She was treated with adjuvant radiation. -Her Oncotype evaluation placed her in the low risk category. - 5 years of anastrozole from May 2017 through May 2022. - She had a negative genetic testing.  She reportedly had a daughter who was diagnosed with TNBC at age 71 and currently receiving chemotherapy.  2.  Osteopenia: - DEXA scan on 10/22/2017 with T score -1.4. -DEXA scan on 12/14/2020 with T score -1.1.   PLAN:  1. Stage I right breast cancer: - She ran out of anastrozole on 12/14/2020.  She has finished 5 years. - Physical examination today did not reveal any suspicious masses in the right breast.  Right breast upper inner quadrant lumpectomy scar is stable.  Right breast lymphedema in the outer quadrant present.  Left breast has no palpable masses.  No palpable adenopathy. - We reviewed labs from 12/14/2020 which showed normal LFTs and CBC. - She plans to move to [facility] to be closer to her daughter.  She will call us and let us know when to make a referral to local oncologist. - We will make an appointment for 1 year follow-up with mammogram  which can be canceled once she moves out of this area. - She had mammogram done on 12/14/2020 which showed a possible left breast mass warranting further evaluation.  She  will have left breast diagnostic mammogram and ultrasound on next Tuesday.  2. Osteopenia: - Reviewed DEXA scan from 12/14/2020 which shows T score -1.1, osteopenia. - Vitamin D level today 72.  Continue calcium and vitamin D supplements.  Breast Cancer therapy associated bone loss: I have recommended calcium, Vitamin D and weight bearing exercises.  Orders placed this encounter:  No orders of the defined types were placed in this encounter.   The patient has a good understanding of the overall plan. She agrees with it. She will call with any problems that may develop before the next visit here.  Derek Jack, MD [place] [phone]   I, Thana Ates, am acting as a scribe for Dr. Derek Jack.  I, Derek Jack MD, have reviewed the above documentation for accuracy and completeness, and I agree with the above.

## 2020-12-17 ENCOUNTER — Inpatient Hospital Stay (HOSPITAL_BASED_OUTPATIENT_CLINIC_OR_DEPARTMENT_OTHER): Payer: Medicare Other | Admitting: Hematology

## 2020-12-17 ENCOUNTER — Other Ambulatory Visit: Payer: Self-pay

## 2020-12-17 VITALS — BP 124/51 | HR 102 | Temp 98.1°F | Resp 19 | Wt 213.3 lb

## 2020-12-17 DIAGNOSIS — N6325 Unspecified lump in the left breast, overlapping quadrants: Secondary | ICD-10-CM | POA: Diagnosis not present

## 2020-12-17 DIAGNOSIS — C50211 Malignant neoplasm of upper-inner quadrant of right female breast: Secondary | ICD-10-CM

## 2020-12-17 DIAGNOSIS — Z79811 Long term (current) use of aromatase inhibitors: Secondary | ICD-10-CM | POA: Diagnosis not present

## 2020-12-17 DIAGNOSIS — M8580 Other specified disorders of bone density and structure, unspecified site: Secondary | ICD-10-CM

## 2020-12-17 DIAGNOSIS — Z170 Estrogen receptor positive status [ER+]: Secondary | ICD-10-CM | POA: Diagnosis not present

## 2020-12-17 DIAGNOSIS — Z1231 Encounter for screening mammogram for malignant neoplasm of breast: Secondary | ICD-10-CM | POA: Diagnosis not present

## 2020-12-17 NOTE — Patient Instructions (Signed)
[facility] Discharge Instructions  You were seen today by Dr. Katragadda. He went over your recent results. Dr. Katragadda will see you back in 1 year for labs and follow up.   Thank you for choosing [facility] at [facility] to provide your oncology and hematology care.  To afford each patient quality time with our provider, please arrive at least 15 minutes before your scheduled appointment time.   If you have a lab appointment with the Cancer Center please come in thru the Main Entrance and check in at the main information desk  You need to re-schedule your appointment should you arrive 10 or more minutes late.  We strive to give you quality time with our providers, and arriving late affects you and other patients whose appointments are after yours.  Also, if you no show three or more times for appointments you may be dismissed from the clinic at the providers discretion.     Again, thank you for choosing [facility].  Our hope is that these requests will decrease the amount of time that you wait before being seen by our physicians.       _____________________________________________________________  Should you have questions after your visit to [facility], please contact our office at [phone] between the hours of 8:00 a.m. and 4:30 p.m.  Voicemails left after 4:00 p.m. will not be returned until the following business day.  For prescription refill requests, have your pharmacy contact our office and allow 72 hours.    Cancer Center Support Programs:   > Cancer Support Group  2nd Tuesday of the month 1pm-2pm, Journey Room    

## 2020-12-18 DIAGNOSIS — C50211 Malignant neoplasm of upper-inner quadrant of right female breast: Secondary | ICD-10-CM | POA: Diagnosis not present

## 2020-12-18 DIAGNOSIS — R928 Other abnormal and inconclusive findings on diagnostic imaging of breast: Secondary | ICD-10-CM | POA: Diagnosis not present

## 2020-12-18 DIAGNOSIS — I890 Lymphedema, not elsewhere classified: Secondary | ICD-10-CM | POA: Diagnosis not present

## 2020-12-22 ENCOUNTER — Ambulatory Visit (HOSPITAL_COMMUNITY)
Admission: RE | Admit: 2020-12-22 | Discharge: 2020-12-22 | Disposition: A | Discharge status: Home / Self Care | Payer: Medicare Other | Source: Ambulatory Visit | Attending: Hematology | Admitting: Hematology

## 2020-12-22 DIAGNOSIS — R922 Inconclusive mammogram: Secondary | ICD-10-CM | POA: Diagnosis not present

## 2020-12-22 DIAGNOSIS — R928 Other abnormal and inconclusive findings on diagnostic imaging of breast: Secondary | ICD-10-CM | POA: Insufficient documentation

## 2020-12-22 DIAGNOSIS — Z853 Personal history of malignant neoplasm of breast: Secondary | ICD-10-CM | POA: Diagnosis not present

## 2020-12-29 DIAGNOSIS — I10 Essential (primary) hypertension: Secondary | ICD-10-CM | POA: Diagnosis not present

## 2020-12-29 DIAGNOSIS — E7849 Other hyperlipidemia: Secondary | ICD-10-CM | POA: Diagnosis not present

## 2021-01-05 DIAGNOSIS — Z23 Encounter for immunization: Secondary | ICD-10-CM | POA: Diagnosis not present

## 2021-01-12 ENCOUNTER — Other Ambulatory Visit (HOSPITAL_COMMUNITY): Payer: Medicare Other

## 2021-01-12 ENCOUNTER — Encounter (HOSPITAL_COMMUNITY): Payer: Medicare Other

## 2021-02-10 ENCOUNTER — Ambulatory Visit (INDEPENDENT_AMBULATORY_CARE_PROVIDER_SITE_OTHER): Payer: Medicare Other | Admitting: Orthopaedic Surgery

## 2021-02-10 ENCOUNTER — Encounter: Payer: Self-pay | Admitting: Orthopaedic Surgery

## 2021-02-10 ENCOUNTER — Other Ambulatory Visit: Payer: Self-pay

## 2021-02-10 VITALS — Ht 62.0 in | Wt 213.0 lb

## 2021-02-10 DIAGNOSIS — M7542 Impingement syndrome of left shoulder: Secondary | ICD-10-CM | POA: Diagnosis not present

## 2021-02-10 MED ORDER — BUPIVACAINE HCL 0.25 % IJ SOLN
2.0000 mL | INTRAMUSCULAR | Status: AC | PRN
  Administered 2021-02-10: 2 mL via INTRA_ARTICULAR

## 2021-02-10 MED ORDER — LIDOCAINE HCL 2 % IJ SOLN
2.0000 mL | INTRAMUSCULAR | Status: AC | PRN
  Administered 2021-02-10: 2 mL

## 2021-02-10 NOTE — Progress Notes (Signed)
Office Visit Note   Patient: Caitlin [name]           Date of Birth: 03-12-1946           MRN: [id number] Visit Date: 02/10/2021              Requested by: Sharilyn Sites, [place],  [place] [address] PCP: Sharilyn Sites, MD   Assessment & Plan: Visit Diagnoses:  1. Impingement syndrome of left shoulder     Plan: Recurrent impingement symptoms left shoulder.  Had prior x-rays demonstrating an area of ectopic calcification possibly consistent with calcific tendinitis.  Had a prior reverse shoulder arthroplasty on the right and doing very well.  No indication of rotator cuff arthropathy on the left.  We will repeat subacromial cortisone injection  Follow-Up Instructions: Return if symptoms worsen or fail to improve.   Orders:  Orders Placed This Encounter  Procedures   Large Joint Inj: L subacromial bursa   No orders of the defined types were placed in this encounter.     Procedures: Large Joint Inj: L subacromial bursa on 02/10/2021 11:05 AM Indications: pain and diagnostic evaluation Details: 25 G 1.5 in needle, anterolateral approach  Arthrogram: No  Medications: 2 mL lidocaine 2 %; 2 mL bupivacaine 0.25 %  12 mg betamethasone injected the subacromial space with Marcaine and Xylocaine Consent was given by the patient. Immediately prior to procedure a time out was called to verify the correct patient, procedure, equipment, support staff and site/side marked as required. Patient was prepped and draped in the usual sterile fashion.      Clinical Data: No additional findings.   Subjective: Chief Complaint  Patient presents with   Left Shoulder - Pain  Patient presents today for left shoulder pain. She received a cortisone injection in December of 2021 and states that it helped. She is wanting to get another injection today. She has pain with activity. She is taking Ibuprofen for pain. She is right hand dominant.   HPI  Review of  Systems   Objective: Vital Signs: Ht 5\' 2"  (1.575 m)   Wt 213 lb (96.6 kg)   BMI 38.96 kg/m   Physical Exam Constitutional:      Appearance: She is well-developed.  Eyes:     Pupils: Pupils are equal, round, and reactive to light.  Pulmonary:     Effort: Pulmonary effort is normal.  Skin:    General: Skin is warm and dry.  Neurological:     Mental Status: She is alert and oriented to person, place, and time.  Psychiatric:        Behavior: Behavior normal.    Ortho Exam awake alert and oriented x3.  Comfortable sitting.  Left shoulder with positive impingement testing.  Minimally positive empty can testing.  Negative Speed sign.  Good strength.  Does have a little loss of external rotation and flexion but not functional loss.  Good grip and release  Specialty Comments:  No specialty comments available.  Imaging: No results found.   PMFS History: Patient Active Problem List   Diagnosis Date Noted   Genetic testing 10/06/2020   Family history of breast cancer 09/09/2020   Family history of pancreatic cancer 09/09/2020   Family history of rectal cancer 09/09/2020   Family history of prostate cancer 09/09/2020   Impingement syndrome of left shoulder 07/15/2020   S/P reverse total shoulder arthroplasty, right 03/03/2020   Primary osteoarthritis, right shoulder 12/04/2019   Chronic right  shoulder pain 11/29/2018   Localized swelling of left lower extremity 07/19/2018   Cellulitis and abscess of left leg 06/15/2018   Unilateral primary osteoarthritis, left knee 05/02/2017   S/P total knee replacement using cement, left 05/02/2017   Primary osteoarthritis of left knee 12/21/2016   Chondromalacia patellae, left knee 05/26/2016   Breast cancer of upper-inner quadrant of right female breast ([place]) 11/04/2015   GERD (gastroesophageal reflux disease) 09/02/2013   Obesity 09/02/2013   Ventral hernia, recurrent 09/02/2013   Unspecified asthma(493.90) 12/12/2012   Cough variant  asthma 03/27/2012   Right middle lobe syndrome 03/27/2012   Past Medical History:  Diagnosis Date   Arthritis    Asthma    Atypical mole 04/22/2020   mod-left lower back   Breast cancer (HCC)    Breast disorder    cancer   Breast mass    benign   Cellulitis    Family history of breast cancer 09/09/2020   Family history of pancreatic cancer 09/09/2020   Family history of prostate cancer 09/09/2020   GERD (gastroesophageal reflux disease)    Heart murmur    AS CHILD  OUTGREW IT   History of hiatal hernia    Hyperlipidemia    Hypertension    Impaired fasting glucose    Rectocele    Varicose vein of leg     Family History  Problem Relation Age of Onset   Rectal cancer Father        dx 64s   Prostate cancer Father        dx 3s   Dementia Mother    Osteoporosis Mother    Breast cancer Daughter 101       triple negative   Healthy Son    Pancreatic cancer Paternal Grandfather        dx 68s-60s   Heart disease Paternal Grandmother    Heart disease Maternal Grandfather    Breast cancer Maternal Grandmother        dx 32s   Scoliosis Sister    Cancer Sister        face/neck ?skin    Past Surgical History:  Procedure Laterality Date   BREAST LUMPECTOMY WITH RADIOACTIVE SEED AND SENTINEL LYMPH NODE BIOPSY Right 11/11/2015   Procedure: RIGHT BREAST RADIOACTIVE SEED GUIDED  LUMPECTOMY AND RADIOACTIVE SEED TARGETED SENTINEL LYMPH NODE BIOPSY;  Surgeon: Stark Klein, MD;  Location: [facility];  Service: General;  Laterality: Right;   BREAST SURGERY Left    breast biopies x4   CHOLECYSTECTOMY  09/08/2010   COLONOSCOPY     COLONOSCOPY N/A 09/25/2013   Procedure: COLONOSCOPY;  Surgeon: Rogene Houston, MD;  [facility];  Service: Endoscopy;  Laterality: N/A;  100-moved to 1200 Ann to notify pt   COLONOSCOPY N/A 11/26/2020   Procedure: COLONOSCOPY;  Surgeon: Rogene Houston, MD;  [facility];  Service: Endoscopy;  Laterality: N/A;  am   HERNIA  REPAIR  12/24/2010, 12-2014   TOTAL 4   LAPAROSCOPIC GASTRIC SLEEVE RESECTION  11-2013   at [facility]  2007/2008   POLYPECTOMY  11/26/2020   Procedure: POLYPECTOMY;  Surgeon: Rogene Houston, MD;  [facility];  Service: Endoscopy;;   REVERSE SHOULDER ARTHROPLASTY Right 03/03/2020   Procedure: RIGHT REVERSE SHOULDER ARTHROPLASTY;  Surgeon: Meredith Pel, MD;  Location: [facility];  Service: Orthopedics;  Laterality: Right;   TONSILLECTOMY  1968   TOTAL KNEE ARTHROPLASTY Left 05/02/2017  TOTAL KNEE ARTHROPLASTY Left 05/02/2017   Procedure: LEFT TOTAL KNEE ARTHROPLASTY;  Surgeon: [name], MD;  Location: [facility];  Service: Orthopedics;  Laterality: Left;   UPPER GASTROINTESTINAL ENDOSCOPY     VAGINAL HYSTERECTOMY  1980s   Social History   Occupational History   Occupation: retired    Fish farm manager: RETIRED    Comment: Pharmacist, hospital  Tobacco Use   Smoking status: Never   Smokeless tobacco: Never  Vaping Use   Vaping Use: Never used  Substance and Sexual Activity   Alcohol use: No   Drug use: No   Sexual activity: Yes    Birth control/protection: Surgical    Comment: hyst

## 2021-02-15 DIAGNOSIS — N390 Urinary tract infection, site not specified: Secondary | ICD-10-CM | POA: Diagnosis not present

## 2021-02-23 DIAGNOSIS — Z6837 Body mass index (BMI) 37.0-37.9, adult: Secondary | ICD-10-CM | POA: Diagnosis not present

## 2021-02-23 DIAGNOSIS — N342 Other urethritis: Secondary | ICD-10-CM | POA: Diagnosis not present

## 2021-02-23 DIAGNOSIS — J4540 Moderate persistent asthma, uncomplicated: Secondary | ICD-10-CM | POA: Diagnosis not present

## 2021-02-23 DIAGNOSIS — N390 Urinary tract infection, site not specified: Secondary | ICD-10-CM | POA: Diagnosis not present

## 2021-02-28 DIAGNOSIS — E782 Mixed hyperlipidemia: Secondary | ICD-10-CM | POA: Diagnosis not present

## 2021-02-28 DIAGNOSIS — I10 Essential (primary) hypertension: Secondary | ICD-10-CM | POA: Diagnosis not present

## 2021-05-10 DIAGNOSIS — Z23 Encounter for immunization: Secondary | ICD-10-CM | POA: Diagnosis not present

## 2021-05-24 ENCOUNTER — Other Ambulatory Visit: Payer: Self-pay

## 2021-05-24 ENCOUNTER — Encounter: Payer: Self-pay | Admitting: Orthopedic Surgery

## 2021-05-24 ENCOUNTER — Ambulatory Visit (INDEPENDENT_AMBULATORY_CARE_PROVIDER_SITE_OTHER): Payer: Medicare Other | Admitting: Orthopedic Surgery

## 2021-05-24 DIAGNOSIS — M205X9 Other deformities of toe(s) (acquired), unspecified foot: Secondary | ICD-10-CM

## 2021-05-24 NOTE — Progress Notes (Signed)
Office Visit Note   Patient: Caitlin [name]           Date of Birth: [birth year]           MRN: [id number] Visit Date: 05/24/2021              Requested by: Sharilyn Sites, MD [address] [place],  [place] [address] PCP: Sharilyn Sites, MD  Chief Complaint  Patient presents with   Right Foot - Nail Problem    Hammer toe of 2nd toe Also nail fungus   Left Foot - Nail Problem    Hammer toe of 2nd toe Also nail fungus      HPI: Patient is a 75 year old woman who presents complaining of painful clawing of the second and third toes bilaterally.  Patient states that for the past 7 months she has been taking antifungal pills for her toenail fungus.  Patient complains of pain with activities of daily living around the second and third toes bilaterally.  Patient has been wearing soft stretchy shoes as well as foam pads over her toes which has not provided sufficient relief.  Assessment & Plan: Visit Diagnoses:  1. Acquired claw toe, unspecified laterality     Plan: Patient states she would like to proceed with surgical intervention.  Discussed that we could proceed with 1 foot at a time which would include a Weil osteotomy of the second and third metatarsals bilaterally as well as a PIP resection of the second toe bilaterally.  Risks and benefits and postoperative care was discussed.  Patient states she will call when she is ready to schedule surgery.  Would obtain 2 view radiographs of the foot prior to surgery.  Follow-Up Instructions: Return if symptoms worsen or fail to improve.   Ortho Exam  Patient is alert, oriented, no adenopathy, well-dressed, normal affect, normal respiratory effort. Examination patient has a palpable dorsalis pedis and posterior tibial pulse bilaterally.  She has callus beneath the second and third metatarsal tarsal heads bilaterally which is tender to palpation she has fixed clawing of the second toe bilaterally and flexible clawing of the third toe  bilaterally.  Imaging: No results found. No images are attached to the encounter.  Labs: Lab Results  Component Value Date   LABURIC 3.7 04/07/2018   REPTSTATUS 02/29/2020 FINAL 02/28/2020   GRAMSTAIN  06/01/2018    RARE WBC PRESENT, PREDOMINANTLY PMN RARE GRAM POSITIVE COCCI Performed at [facility], [place] [address] [place], [place] [address]    CULT  02/28/2020    NO GROWTH Performed at [facility] [address], [place],  [address]    LABORGA METHICILLIN RESISTANT STAPHYLOCOCCUS AUREUS 06/01/2018     Lab Results  Component Value Date   ALBUMIN 3.7 12/14/2020   ALBUMIN 3.8 09/14/2020   ALBUMIN 3.9 12/03/2019    No results found for: MG Lab Results  Component Value Date   VD25OH 72.09 12/14/2020   VD25OH 67.82 09/14/2020   VD25OH 43.48 12/03/2019    No results found for: PREALBUMIN CBC EXTENDED Latest Ref Rng & Units 12/14/2020 09/14/2020 02/28/2020  WBC 4.0 - 10.5 K/uL 4.3 4.9 5.8  RBC 3.87 - 5.11 MIL/uL 3.46(L) 3.52(L) 3.55(L)  HGB 12.0 - 15.0 g/dL 11.8(L) 12.0 12.1  HCT 36.0 - 46.0 % 34.7(L) 35.1(L) 36.1  PLT 150 - 400 K/uL 229 246 282  NEUTROABS 1.7 - 7.7 K/uL 2.3 2.8 -  LYMPHSABS 0.7 - 4.0 K/uL 1.1 1.2 -  There is no height or weight on file to calculate BMI.  Orders:  No orders of the defined types were placed in this encounter.  No orders of the defined types were placed in this encounter.    Procedures: No procedures performed  Clinical Data: No additional findings.  ROS:  All other systems negative, except as noted in the HPI. Review of Systems  Objective: Vital Signs: There were no vitals taken for this visit.  Specialty Comments:  No specialty comments available.  PMFS History: Patient Active Problem List   Diagnosis Date Noted   Genetic testing 10/06/2020   Family history of breast cancer 09/09/2020   Family history of pancreatic cancer 09/09/2020   Family history of rectal cancer 09/09/2020    Family history of prostate cancer 09/09/2020   Impingement syndrome of left shoulder 07/15/2020   S/P reverse total shoulder arthroplasty, right 03/03/2020   Primary osteoarthritis, right shoulder 12/04/2019   Chronic right shoulder pain 11/29/2018   Localized swelling of left lower extremity 07/19/2018   Cellulitis and abscess of left leg 06/15/2018   Unilateral primary osteoarthritis, left knee 05/02/2017   S/P total knee replacement using cement, left 05/02/2017   Primary osteoarthritis of left knee 12/21/2016   Chondromalacia patellae, left knee 05/26/2016   Breast cancer of upper-inner quadrant of right female breast ([facility]) 11/04/2015   GERD (gastroesophageal reflux disease) 09/02/2013   Obesity 09/02/2013   Ventral hernia, recurrent 09/02/2013   Unspecified asthma(493.90) 12/12/2012   Cough variant asthma 03/27/2012   Right middle lobe syndrome 03/27/2012   Past Medical History:  Diagnosis Date   Arthritis    Asthma    Atypical mole 04/22/2020   mod-left lower back   Breast cancer ([facility])    Breast disorder    cancer   Breast mass    benign   Cellulitis    Family history of breast cancer 09/09/2020   Family history of pancreatic cancer 09/09/2020   Family history of prostate cancer 09/09/2020   GERD (gastroesophageal reflux disease)    Heart murmur    AS CHILD  OUTGREW IT   History of hiatal hernia    Hyperlipidemia    Hypertension    Impaired fasting glucose    Rectocele    Varicose vein of leg     Family History  Problem Relation Age of Onset   Rectal cancer Father        dx 33s   Prostate cancer Father        dx 35s   Dementia Mother    Osteoporosis Mother    Breast cancer Daughter 29       triple negative   Healthy Son    Pancreatic cancer Paternal Grandfather        dx 65s-60s   Heart disease Paternal Grandmother    Heart disease Maternal Grandfather    Breast cancer Maternal Grandmother        dx 34s   Scoliosis Sister    Cancer Sister         face/neck ?skin    Past Surgical History:  Procedure Laterality Date   BREAST LUMPECTOMY WITH RADIOACTIVE SEED AND SENTINEL LYMPH NODE BIOPSY Right 11/11/2015   Procedure: RIGHT BREAST RADIOACTIVE SEED GUIDED  LUMPECTOMY AND RADIOACTIVE SEED TARGETED SENTINEL LYMPH NODE BIOPSY;  Surgeon: Stark Klein, MD;  Location: [facility];  Service: General;  Laterality: Right;   BREAST SURGERY Left    breast biopies x4   CHOLECYSTECTOMY  09/08/2010  COLONOSCOPY     COLONOSCOPY N/A 09/25/2013   Procedure: COLONOSCOPY;  Surgeon: Rogene Houston, MD;  [facility];  Service: Endoscopy;  Laterality: N/A;  100-moved to 1200 Ann to notify pt   COLONOSCOPY N/A 11/26/2020   Procedure: COLONOSCOPY;  Surgeon: Rogene Houston, MD;  [facility];  Service: Endoscopy;  Laterality: N/A;  am   HERNIA REPAIR  12/24/2010, 12-2014   TOTAL 4   LAPAROSCOPIC GASTRIC SLEEVE RESECTION  11-2013   at [facility]  2007/2008   POLYPECTOMY  11/26/2020   Procedure: POLYPECTOMY;  Surgeon: Rogene Houston, MD;  [facility];  Service: Endoscopy;;   REVERSE SHOULDER ARTHROPLASTY Right 03/03/2020   Procedure: RIGHT REVERSE SHOULDER ARTHROPLASTY;  Surgeon: Meredith Pel, MD;  Location: [facility];  Service: Orthopedics;  Laterality: Right;   TONSILLECTOMY  1968   TOTAL KNEE ARTHROPLASTY Left 05/02/2017   TOTAL KNEE ARTHROPLASTY Left 05/02/2017   Procedure: LEFT TOTAL KNEE ARTHROPLASTY;  Surgeon: [name], MD;  Location: [facility];  Service: Orthopedics;  Laterality: Left;   UPPER GASTROINTESTINAL ENDOSCOPY     VAGINAL HYSTERECTOMY  1980s   Social History   Occupational History   Occupation: retired    Fish farm manager: RETIRED    Comment: Pharmacist, hospital  Tobacco Use   Smoking status: Never   Smokeless tobacco: Never  Vaping Use   Vaping Use: Never used  Substance and Sexual Activity   Alcohol use: No   Drug use: No   Sexual activity: Yes    Birth  control/protection: Surgical    Comment: hyst

## 2021-07-15 DIAGNOSIS — Z0000 Encounter for general adult medical examination without abnormal findings: Secondary | ICD-10-CM | POA: Diagnosis not present

## 2021-07-15 DIAGNOSIS — J4540 Moderate persistent asthma, uncomplicated: Secondary | ICD-10-CM | POA: Diagnosis not present

## 2021-07-15 DIAGNOSIS — I872 Venous insufficiency (chronic) (peripheral): Secondary | ICD-10-CM | POA: Diagnosis not present

## 2021-07-15 DIAGNOSIS — Z9884 Bariatric surgery status: Secondary | ICD-10-CM | POA: Diagnosis not present

## 2021-07-15 DIAGNOSIS — E7849 Other hyperlipidemia: Secondary | ICD-10-CM | POA: Diagnosis not present

## 2021-07-15 DIAGNOSIS — K760 Fatty (change of) liver, not elsewhere classified: Secondary | ICD-10-CM | POA: Diagnosis not present

## 2021-07-15 DIAGNOSIS — Z6837 Body mass index (BMI) 37.0-37.9, adult: Secondary | ICD-10-CM | POA: Diagnosis not present

## 2021-07-15 DIAGNOSIS — I493 Ventricular premature depolarization: Secondary | ICD-10-CM | POA: Diagnosis not present

## 2021-07-15 DIAGNOSIS — I10 Essential (primary) hypertension: Secondary | ICD-10-CM | POA: Diagnosis not present

## 2021-07-15 DIAGNOSIS — R945 Abnormal results of liver function studies: Secondary | ICD-10-CM | POA: Diagnosis not present

## 2021-07-15 DIAGNOSIS — E782 Mixed hyperlipidemia: Secondary | ICD-10-CM | POA: Diagnosis not present

## 2021-07-15 DIAGNOSIS — Z1331 Encounter for screening for depression: Secondary | ICD-10-CM | POA: Diagnosis not present

## 2021-07-15 DIAGNOSIS — R7309 Other abnormal glucose: Secondary | ICD-10-CM | POA: Diagnosis not present

## 2021-07-15 DIAGNOSIS — E6609 Other obesity due to excess calories: Secondary | ICD-10-CM | POA: Diagnosis not present

## 2021-07-30 DIAGNOSIS — I10 Essential (primary) hypertension: Secondary | ICD-10-CM | POA: Diagnosis not present

## 2021-07-30 DIAGNOSIS — E782 Mixed hyperlipidemia: Secondary | ICD-10-CM | POA: Diagnosis not present

## 2021-08-03 IMAGING — MG MM DIGITAL DIAGNOSTIC UNILAT*L* W/ TOMO W/ CAD
6 series · 6 of 18 positions shown · non-contrast
Comparison: Previous exams.

CLINICAL DATA: Screening recall for possible left breast mass.
Personal history of breast cancer post lumpectomy 5045 as well as
daughter recently diagnosed with triple negative breast cancer.

EXAM:
DIGITAL DIAGNOSTIC UNILATERAL LEFT MAMMOGRAM WITH TOMOSYNTHESIS AND
CAD
TECHNIQUE: Left digital diagnostic mammography and breast tomosynthesis was
performed. The images were evaluated with computer-aided detection.

[L CC synth-2D]
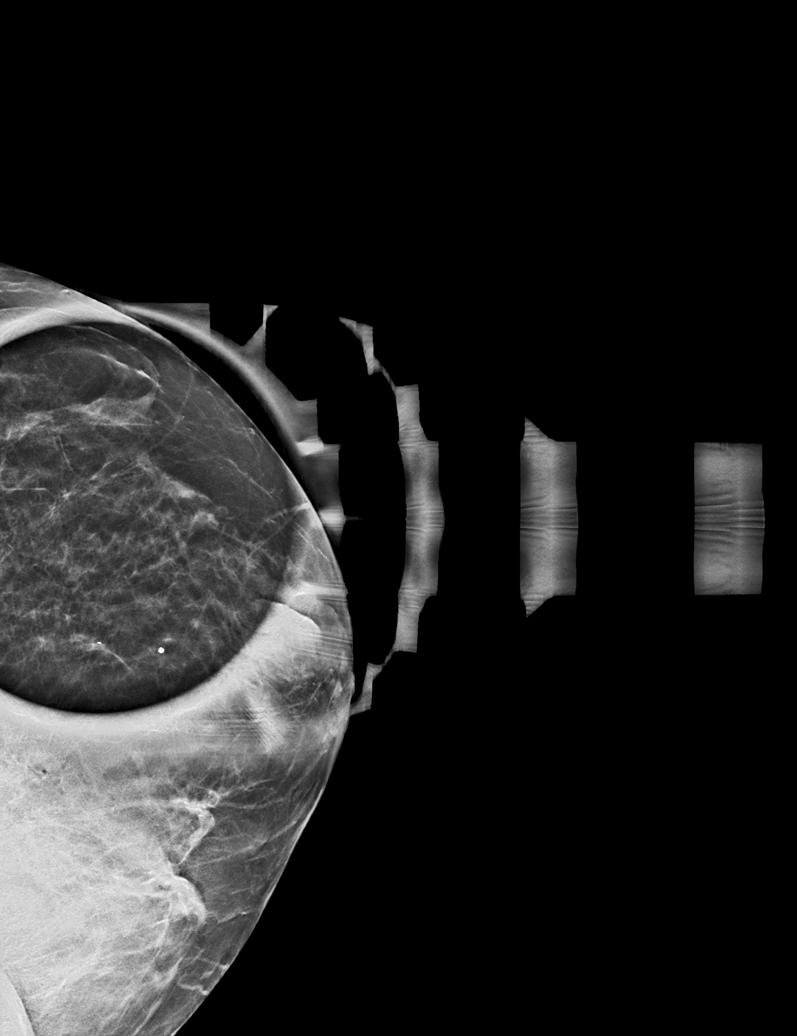

[L MLO synth-2D (1 of 2)]
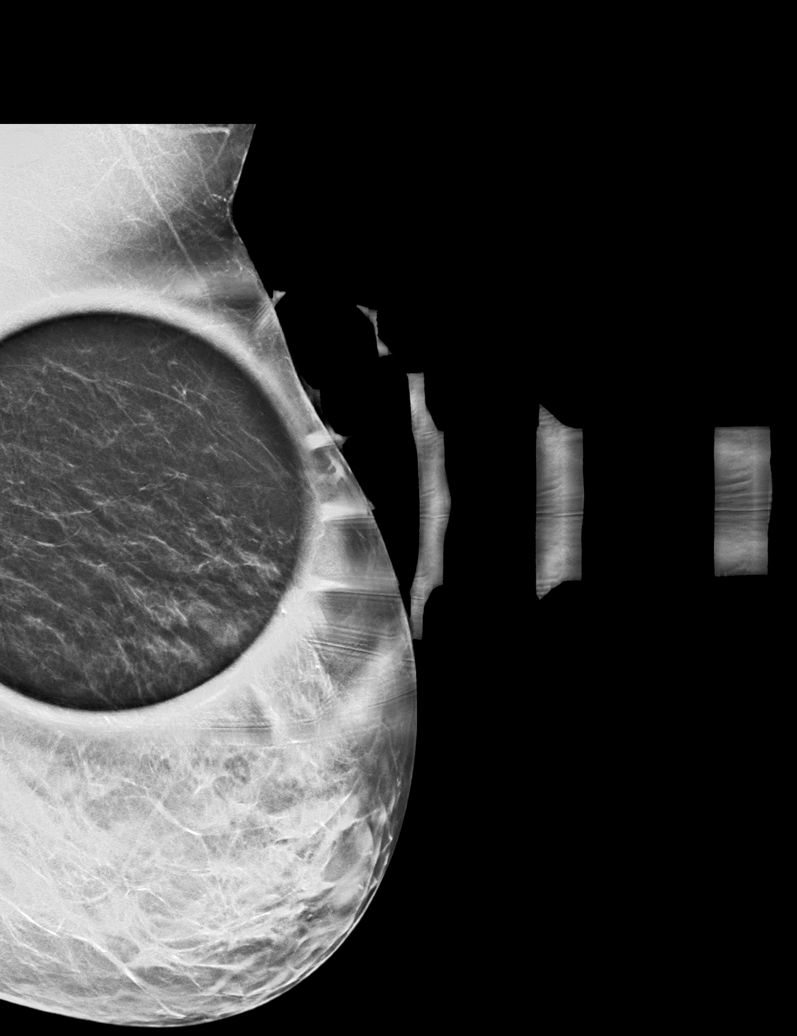

[L MLO synth-2D (2 of 2)]
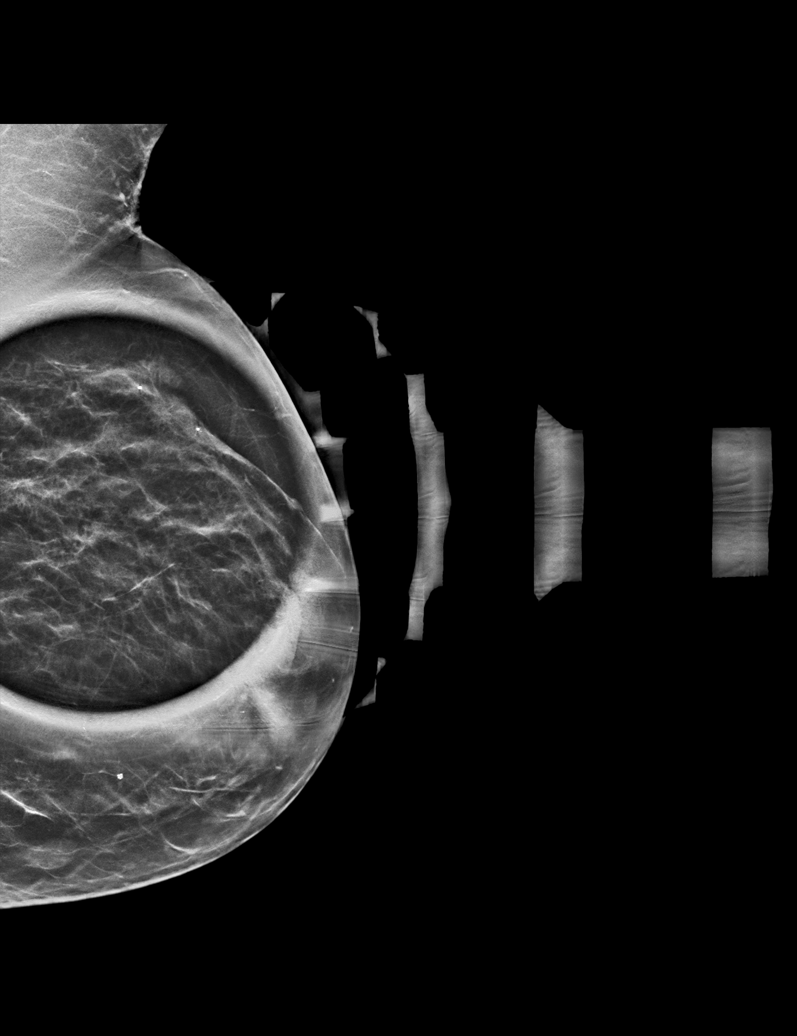

[L CC tomo · tomo slice 18/35.0]
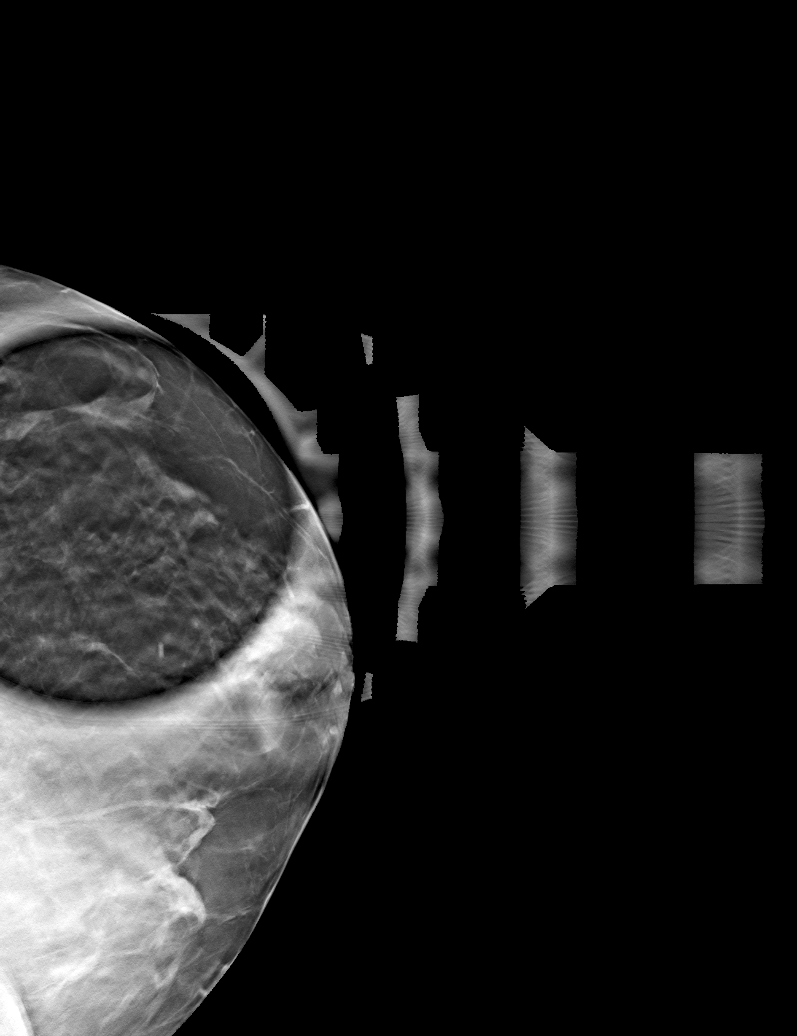

[L MLO tomo (1 of 2) · tomo slice 23/44.0]
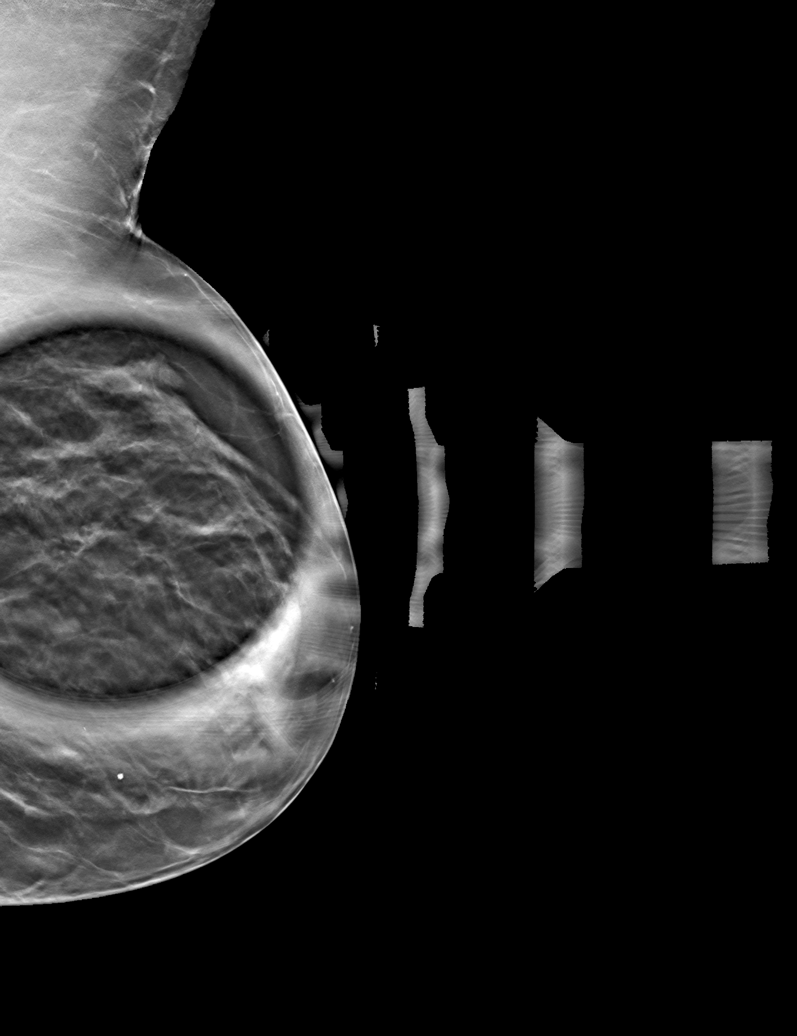

[L MLO tomo (2 of 2) · tomo slice 23/45.0]
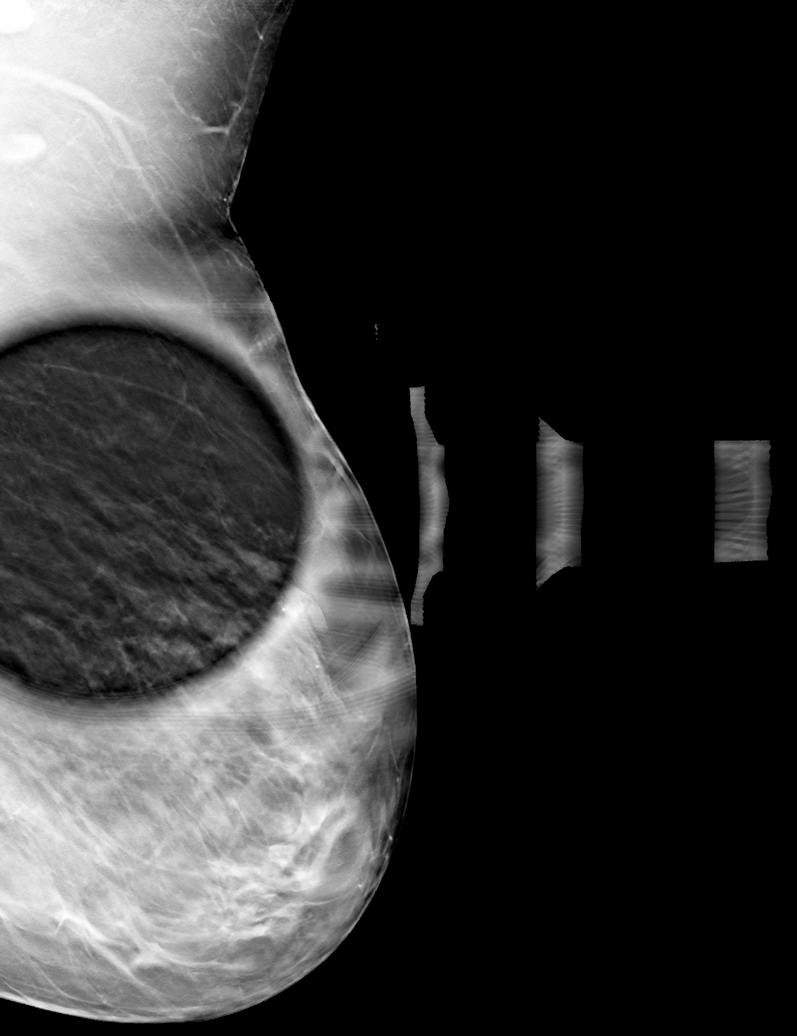

[6 of 18 positions shown; findings below may reference images not displayed]

ACR Breast Density Category c: The breast tissue is heterogeneously
dense, which may obscure small masses.
FINDINGS: Additional tomograms were performed of left breast. The initially
questioned possible left breast mass resolves on the additional
imaging with the heterogeneous fibroglandular pattern stable in
appearance. There is no mammographic evidence of malignancy in the
left breast.
IMPRESSION: No mammographic evidence of malignancy in the left breast.

RECOMMENDATION:
1.  Screening mammogram in one year.(Code:[id number])

2. Personal history of right breast cancer 5045 as well as strong
family history of breast cancer including daughter recently
diagnosed with triple negative breast cancer. The American [employer]
Society recommends annual MRI and mammography in patients with an
estimated lifetime risk of developing breast cancer greater than 20
- 25%, or who are known or suspected to be positive for the breast
cancer gene.

I have discussed the findings and recommendations with the patient.
If applicable, a reminder letter will be sent to the patient
regarding the next appointment.

BI-RADS CATEGORY  2: Benign.

## 2021-08-23 DIAGNOSIS — M79674 Pain in right toe(s): Secondary | ICD-10-CM | POA: Diagnosis not present

## 2021-08-23 DIAGNOSIS — M79671 Pain in right foot: Secondary | ICD-10-CM | POA: Diagnosis not present

## 2021-08-23 DIAGNOSIS — L110 Acquired keratosis follicularis: Secondary | ICD-10-CM | POA: Diagnosis not present

## 2021-08-23 DIAGNOSIS — I739 Peripheral vascular disease, unspecified: Secondary | ICD-10-CM | POA: Diagnosis not present

## 2021-08-23 DIAGNOSIS — M79672 Pain in left foot: Secondary | ICD-10-CM | POA: Diagnosis not present

## 2021-08-23 DIAGNOSIS — M79675 Pain in left toe(s): Secondary | ICD-10-CM | POA: Diagnosis not present

## 2021-08-31 DIAGNOSIS — E7849 Other hyperlipidemia: Secondary | ICD-10-CM | POA: Diagnosis not present

## 2021-08-31 DIAGNOSIS — I10 Essential (primary) hypertension: Secondary | ICD-10-CM | POA: Diagnosis not present

## 2021-09-07 ENCOUNTER — Ambulatory Visit (INDEPENDENT_AMBULATORY_CARE_PROVIDER_SITE_OTHER): Payer: Medicare Other

## 2021-09-07 ENCOUNTER — Ambulatory Visit (INDEPENDENT_AMBULATORY_CARE_PROVIDER_SITE_OTHER): Payer: Medicare Other | Admitting: Adult Health

## 2021-09-07 ENCOUNTER — Encounter: Payer: Self-pay | Admitting: Adult Health

## 2021-09-07 ENCOUNTER — Other Ambulatory Visit: Payer: Self-pay

## 2021-09-07 VITALS — BP 124/70 | HR 105 | Temp 98.6°F | Ht 63.0 in | Wt 222.0 lb

## 2021-09-07 DIAGNOSIS — J45991 Cough variant asthma: Secondary | ICD-10-CM

## 2021-09-07 DIAGNOSIS — J4541 Moderate persistent asthma with (acute) exacerbation: Secondary | ICD-10-CM | POA: Diagnosis not present

## 2021-09-07 DIAGNOSIS — J45901 Unspecified asthma with (acute) exacerbation: Secondary | ICD-10-CM | POA: Insufficient documentation

## 2021-09-07 DIAGNOSIS — J45909 Unspecified asthma, uncomplicated: Secondary | ICD-10-CM | POA: Diagnosis not present

## 2021-09-07 DIAGNOSIS — R059 Cough, unspecified: Secondary | ICD-10-CM | POA: Diagnosis not present

## 2021-09-07 MED ORDER — AZITHROMYCIN 250 MG PO TABS: ORAL_TABLET | ORAL | 0 refills | Status: AC

## 2021-09-07 MED ORDER — ALBUTEROL SULFATE HFA 108 (90 BASE) MCG/ACT IN AERS: 1.0000 | INHALATION_SPRAY | Freq: Four times a day (QID) | RESPIRATORY_TRACT | 2 refills | Status: DC | PRN

## 2021-09-07 MED ORDER — METHYLPREDNISOLONE ACETATE 80 MG/ML IJ SUSP
120.0000 mg | Freq: Once | INTRAMUSCULAR | Status: AC
  Administered 2021-09-07: 120 mg via INTRAMUSCULAR

## 2021-09-07 MED ORDER — ALBUTEROL SULFATE (2.5 MG/3ML) 0.083% IN NEBU: 2.5000 mg | INHALATION_SOLUTION | Freq: Four times a day (QID) | RESPIRATORY_TRACT | 5 refills | Status: AC | PRN

## 2021-09-07 MED ORDER — ALBUTEROL SULFATE HFA 108 (90 BASE) MCG/ACT IN AERS: 1.0000 | INHALATION_SPRAY | Freq: Four times a day (QID) | RESPIRATORY_TRACT | 2 refills | Status: AC | PRN

## 2021-09-07 MED ORDER — BUDESONIDE-FORMOTEROL FUMARATE 160-4.5 MCG/ACT IN AERO: 2.0000 | INHALATION_SPRAY | Freq: Two times a day (BID) | RESPIRATORY_TRACT | 12 refills | Status: AC

## 2021-09-07 MED ORDER — PREDNISONE 10 MG PO TABS: ORAL_TABLET | ORAL | 0 refills | Status: DC

## 2021-09-07 NOTE — Progress Notes (Signed)
@Patient  ID: Caitlin [name], female    DOB: [birth year], 75 y.o.   MRN: [id number]  Chief Complaint  Patient presents with   Acute Visit    Referring provider: Sharilyn Sites, MD  HPI: 76 year old female never smoker followed for Asthma with an allergic phenotype Medical history significant for breast cancer-lumpectomy /radiation-right (2017)  TEST/EVENTS :   Allergy profile 05/15/2018 >  Eos 0.3 /  IgE  1227 RAST pos to everything but cats and mold   09/07/2021 Acute OV : Asthma  Patient presents for an acute office visit . Complains over last 2 weeks of increased cough, wheezing and tightness. Has sinus drainage. Cough is productive with thick white/green mucus . Says uses Primatene mist over the counter to control Asthma. Says she can not afford asthma inhalers .  Typically uses Primatene inhaler most every day.  Has been looking into her new formulary. Says insurance will now cover Symbicort through [facility].  She denies any hemoptysis, chest pain, orthopnea, edema. Has not been on any recent antibiotics.  No known sick contacts.  Has been fully vaccinated for the flu and COVID. Has nebulizer at home but out of albuterol neb meds.      Allergies  Allergen Reactions   Horse-Derived Products     Reaction as a child "heart stopped"   Sulfa Antibiotics Hives   Sulfamethoxazole-Trimethoprim Rash   Tetanus Toxoid Adsorbed Palpitations    As a child    Immunization History  Administered Date(s) Administered   Influenza Split 05/02/2011, 04/24/2012   Influenza, High Dose Seasonal PF 05/04/2017, 05/16/2019   Influenza-Unspecified 05/02/2011, 04/24/2012   Moderna Covid-19 Vaccine Bivalent Booster 63yrs & up 01/05/2021   Moderna SARS-COV2 Booster Vaccination 05/25/2020   Moderna Sars-Covid-2 Vaccination 09/13/2019, 10/11/2019   Zoster, Live 08/01/2008    Past Medical History:  Diagnosis Date   Arthritis    Asthma    Atypical mole 04/22/2020   mod-left lower back    Breast cancer ([place])    Breast disorder    cancer   Breast mass    benign   Cellulitis    Family history of breast cancer 09/09/2020   Family history of pancreatic cancer 09/09/2020   Family history of prostate cancer 09/09/2020   GERD (gastroesophageal reflux disease)    Heart murmur    AS CHILD  OUTGREW IT   History of hiatal hernia    Hyperlipidemia    Hypertension    Impaired fasting glucose    Rectocele    Varicose vein of leg     Tobacco History: Social History   Tobacco Use  Smoking Status Never  Smokeless Tobacco Never   Counseling given: Not Answered   Outpatient Medications Prior to Visit  Medication Sig Dispense Refill   calcium citrate-vitamin D (CALCIUM CITRATE CHEWY BITE) 500-500 MG-UNIT chewable tablet Chew 1 tablet by mouth in the morning. Bariatric - Calcium Citrate Chewy Bites 500mg  w/D3     Cyanocobalamin (VITAMIN B-12) 2500 MCG SUBL Place 2,500 mcg under the tongue in the morning.     dextromethorphan-guaiFENesin (MUCINEX DM) 30-600 MG 12hr tablet Take 1 tablet by mouth at bedtime as needed (cough/congestion).     docusate sodium (COLACE) 100 MG capsule Take 100 mg by mouth at bedtime.      esomeprazole (NEXIUM) 20 MG capsule Take 40 mg by mouth daily before breakfast.      famotidine (PEPCID) 20 MG tablet Take 20 mg by mouth daily in the afternoon.  hydrochlorothiazide (HYDRODIURIL) 25 MG tablet Take 25 mg by mouth in the morning.     IRON-VITAMIN C PO Take 1 tablet by mouth at bedtime. (Chewable) Celebrate Vitamins Iron+C+ Iron (54mg )+vitamin C (90mg )     losartan (COZAAR) 100 MG tablet Take 100 mg by mouth in the morning.     magnesium oxide (MAG-OX) 400 MG tablet Take 400 mg by mouth daily at 6 PM. (1700)     Melatonin 10 MG TABS Take 10-20 mg by mouth at bedtime as needed (sleep).     Menaquinone-7 (VITAMIN K2) 100 MCG CAPS Take 100 mcg by mouth daily in the afternoon.     Multiple Vitamins-Minerals (CELEBRATE MULTI-COMPLETE 60 PO) Take 1 capsule  by mouth in the morning and at bedtime.     pravastatin (PRAVACHOL) 40 MG tablet Take 40 mg by mouth at bedtime.     Probiotic Product (PROBIOTIC DAILY PO) Take 1 capsule by mouth daily in the afternoon.     Vitamin D3 (VITAMIN D) 25 MCG tablet Take 1,000 Units by mouth daily.     vitamin k 100 MCG tablet Take 100 mcg by mouth daily.     anastrozole (ARIMIDEX) 1 MG tablet Take 1 tablet (1 mg total) by mouth daily. (Patient not taking: Reported on 09/07/2021) 90 tablet 4   Budeson-Glycopyrrol-Formoterol (BREZTRI AEROSPHERE) 160-9-4.8 MCG/ACT AERO Inhale 1-2 puffs into the lungs 2 (two) times daily. (Patient not taking: Reported on 09/07/2021) 10.7 g 11   No facility-administered medications prior to visit.     Review of Systems:   Constitutional:   No  weight loss, night sweats,  Fevers, chills, + fatigue, or  lassitude.  HEENT:   No headaches,  Difficulty swallowing,  Tooth/dental problems, or  Sore throat,                No sneezing, itching, ear ache,  +nasal congestion, post nasal drip,   CV:  No chest pain,  Orthopnea, PND, swelling in lower extremities, anasarca, dizziness, palpitations, syncope.   GI  No heartburn, indigestion, abdominal pain, nausea, vomiting, diarrhea, change in bowel habits, loss of appetite, bloody stools.   Resp:   No chest wall deformity  Skin: no rash or lesions.  GU: no dysuria, change in color of urine, no urgency or frequency.  No flank pain, no hematuria   MS:  No joint pain or swelling.  No decreased range of motion.  No back pain.    Physical Exam  BP 124/70 (BP Location: Left Arm, Patient Position: Sitting, Cuff Size: Large)    Pulse (!) 105    Temp 98.6 F (37 C) (Oral)    Ht 5\' 3"  (1.6 m)    Wt 222 lb (100.7 kg)    SpO2 95%    BMI 39.33 kg/m   GEN: A/Ox3; pleasant , NAD, well nourished    HEENT:  [place]/AT,  EACs-clear, TMs-wnl, NOSE-clear, THROAT-clear, no lesions, no postnasal drip or exudate noted.   NECK:  Supple w/ fair ROM; no JVD;  normal carotid impulses w/o bruits; no thyromegaly or nodules palpated; no lymphadenopathy.    RESP expiratory wheezes and a few rhonchi bilaterally.  Speaks in full sentences.   no accessory muscle use, no dullness to percussion  CARD:  RRR, no m/r/g, tr  peripheral edema, pulses intact, no cyanosis or clubbing.  GI:   Soft & nt; nml bowel sounds; no organomegaly or masses detected.   Musco: Warm bil, no deformities or joint swelling noted.  Neuro: alert, no focal deficits noted.    Skin: Warm, no lesions or rashes    Lab Results:  CBC   BMET   BNP No results found for: BNP  ProBNP No results found for: PROBNP  Imaging: No results found.    No flowsheet data found.  No results found for: NITRICOXIDE      Assessment & Plan:   Asthma exacerbation Acute asthmatic bronchitic exacerbation.  Patient has poorly controlled asthma with daily symptoms and using over-the-counter Primatene Mist.  I have advised her to avoid Primatene Mist.  We will begin maintenance regimen with Symbicort.  May use albuterol inhaler or nebulizer. Depo-Medrol 120 IM injection in the office. Check chest x-ray today. Set up PFTs in 4 weeks Close follow-up in 1 week.  Asthma action plan discussed.  Plan  Patient Instructions  Zpack take as directed Mucinex DM twice daily as needed for cough and congestion Prednisone taper over the next week Begin Symbicort 160 2 puffs twice daily, rinse after use Albuterol inhaler 1 to 2 puffs every 4-6 hours as needed Albuterol neb every 4-6hr as needed  Stop using Primatene Mist Asthma action plan Chest x-ray today Follow-up in 1 week and as needed Follow-up in 4 weeks with Dr. Melvyn Novas or Abigael Mogle NP with PFT and As needed   Please contact office for sooner follow up if symptoms do not improve or worsen or seek emergency care           Rexene Edison, NP 09/07/2021

## 2021-09-07 NOTE — Addendum Note (Signed)
Addended by: Vanessa Barbara on: 09/07/2021 12:21 PM   Modules accepted: Orders

## 2021-09-07 NOTE — Assessment & Plan Note (Signed)
Acute asthmatic bronchitic exacerbation.  Patient has poorly controlled asthma with daily symptoms and using over-the-counter Primatene Mist.  I have advised her to avoid Primatene Mist.  We will begin maintenance regimen with Symbicort.  May use albuterol inhaler or nebulizer. Depo-Medrol 120 IM injection in the office. Check chest x-ray today. Set up PFTs in 4 weeks Close follow-up in 1 week.  Asthma action plan discussed.  Plan  Patient Instructions  Zpack take as directed Mucinex DM twice daily as needed for cough and congestion Prednisone taper over the next week Begin Symbicort 160 2 puffs twice daily, rinse after use Albuterol inhaler 1 to 2 puffs every 4-6 hours as needed Albuterol neb every 4-6hr as needed  Stop using Primatene Mist Asthma action plan Chest x-ray today Follow-up in 1 week and as needed Follow-up in 4 weeks with Dr. Melvyn Novas or Ketan Renz NP with PFT and As needed   Please contact office for sooner follow up if symptoms do not improve or worsen or seek emergency care

## 2021-09-07 NOTE — Patient Instructions (Addendum)
Zpack take as directed Mucinex DM twice daily as needed for cough and congestion Prednisone taper over the next week Begin Symbicort 160 2 puffs twice daily, rinse after use Albuterol inhaler 1 to 2 puffs every 4-6 hours as needed Albuterol neb every 4-6hr as needed  Stop using Primatene Mist Asthma action plan Chest x-ray today Follow-up in 1 week and as needed Follow-up in 4 weeks with Dr. Melvyn Novas or Aurianna Earlywine NP with PFT and As needed   Please contact office for sooner follow up if symptoms do not improve or worsen or seek emergency care

## 2021-09-14 NOTE — Progress Notes (Signed)
Called and spoke with patient, advised of results/recommendations per Rexene Edison NP. She verbalized understanding and has a scheduled f/u on 2/15 with TP.  Nothing further needed.

## 2021-09-15 ENCOUNTER — Ambulatory Visit (INDEPENDENT_AMBULATORY_CARE_PROVIDER_SITE_OTHER): Payer: Medicare Other | Admitting: Adult Health

## 2021-09-15 ENCOUNTER — Encounter: Payer: Self-pay | Admitting: Adult Health

## 2021-09-15 ENCOUNTER — Other Ambulatory Visit: Payer: Self-pay

## 2021-09-15 DIAGNOSIS — J4541 Moderate persistent asthma with (acute) exacerbation: Secondary | ICD-10-CM | POA: Diagnosis not present

## 2021-09-15 NOTE — Patient Instructions (Addendum)
Symbicort 160 2 puffs twice daily, rinse after use Albuterol inhaler 1 to 2 puffs every 4-6 hours as needed Albuterol neb every 4-6hr as needed  Claritin 10mg  daily As needed  nasal drainage.  Asthma action plan Follow-up in 4 weeks with Dr. Melvyn Novas or Shelbia Scinto NP with PFT and As needed   Please contact office for sooner follow up if symptoms do not improve or worsen or seek emergency care

## 2021-09-15 NOTE — Assessment & Plan Note (Signed)
Asthma exacerbation-now resolved.  Patient is much improved on her maintenance regimen.  Check PFTs in 3 weeks as planned. Continue on Symbicort.  Add Claritin for trigger prevention.  Plan  Patient Instructions  Symbicort 160 2 puffs twice daily, rinse after use Albuterol inhaler 1 to 2 puffs every 4-6 hours as needed Albuterol neb every 4-6hr as needed  Claritin 10mg  daily As needed  nasal drainage.  Asthma action plan Follow-up in 4 weeks with Dr. Melvyn Novas or Laiana Fratus NP with PFT and As needed   Please contact office for sooner follow up if symptoms do not improve or worsen or seek emergency care

## 2021-09-15 NOTE — Progress Notes (Signed)
@Patient  ID: Caitlin [name], female    DOB: 19-Sep-1945, 76 y.o.   MRN: [id number]  Chief Complaint  Patient presents with   Follow-up    Referring provider: Sharilyn Sites, MD  HPI: 76 year old female never smoker followed for asthma with an allergic phenotype Medical history significant for breast cancer-lumpectomy/radiation-right (2017)  TEST/EVENTS :  Allergy profile 05/15/2018 >  Eos 0.3 /  IgE  1227 RAST pos to everything but cats and mold   09/15/2021 Follow up : Asthma  Patient returns for a 1 week follow-up.  Patient was seen last week for an acute asthmatic bronchitic exacerbation.  She was having increased cough wheezing.  She had been using Primatene mist multiple times throughout the day.  She was treated with a Z-Pak and prednisone taper.  She was started on Symbicort twice daily.  Advised to stop Primatene Mist use albuterol as rescue.  Chest x-ray showed bibasilar atelectasis. Since last visit patient says she is improved with decreased cough congestion and wheezing. She is feeling much better. Symbicort seems to be working well. Still using Albuterol nebs once daily .  She denies any chest pain orthopnea PND or increased leg swelling  Allergies  Allergen Reactions   Horse-Derived Products     Reaction as a child "heart stopped"   Sulfa Antibiotics Hives   Sulfamethoxazole-Trimethoprim Rash   Tetanus Toxoid Adsorbed Palpitations    As a child    Immunization History  Administered Date(s) Administered   Influenza Split 05/02/2011, 04/24/2012   Influenza, High Dose Seasonal PF 05/04/2017, 05/16/2019   Influenza-Unspecified 05/02/2011, 04/24/2012   Moderna Covid-19 Vaccine Bivalent Booster 82yrs & up 01/05/2021   Moderna SARS-COV2 Booster Vaccination 05/25/2020   Moderna Sars-Covid-2 Vaccination 09/13/2019, 10/11/2019   Zoster, Live 08/01/2008    Past Medical History:  Diagnosis Date   Arthritis    Asthma    Atypical mole 04/22/2020   mod-left lower back    Breast cancer ([place])    Breast disorder    cancer   Breast mass    benign   Cellulitis    Family history of breast cancer 09/09/2020   Family history of pancreatic cancer 09/09/2020   Family history of prostate cancer 09/09/2020   GERD (gastroesophageal reflux disease)    Heart murmur    AS CHILD  OUTGREW IT   History of hiatal hernia    Hyperlipidemia    Hypertension    Impaired fasting glucose    Rectocele    Varicose vein of leg     Tobacco History: Social History   Tobacco Use  Smoking Status Never  Smokeless Tobacco Never   Counseling given: Not Answered   Outpatient Medications Prior to Visit  Medication Sig Dispense Refill   albuterol (PROVENTIL) (2.5 MG/3ML) 0.083% nebulizer solution Take 3 mLs (2.5 mg total) by nebulization every 6 (six) hours as needed for wheezing or shortness of breath. 75 mL 5   budesonide-formoterol (SYMBICORT) 160-4.5 MCG/ACT inhaler Inhale 2 puffs into the lungs 2 (two) times daily. 1 each 12   calcium citrate-vitamin D (CALCIUM CITRATE CHEWY BITE) 500-500 MG-UNIT chewable tablet Chew 1 tablet by mouth in the morning. Bariatric - Calcium Citrate Chewy Bites 500mg  w/D3     Cyanocobalamin (VITAMIN B-12) 2500 MCG SUBL Place 2,500 mcg under the tongue in the morning.     dextromethorphan-guaiFENesin (MUCINEX DM) 30-600 MG 12hr tablet Take 1 tablet by mouth at bedtime as needed (cough/congestion).     docusate sodium (COLACE) 100 MG capsule  Take 100 mg by mouth at bedtime.      esomeprazole (NEXIUM) 20 MG capsule Take 40 mg by mouth daily before breakfast.      famotidine (PEPCID) 20 MG tablet Take 20 mg by mouth daily in the afternoon.     hydrochlorothiazide (HYDRODIURIL) 25 MG tablet Take 25 mg by mouth in the morning.     IRON-VITAMIN C PO Take 1 tablet by mouth at bedtime. (Chewable) Celebrate Vitamins Iron+C+ Iron (54mg )+vitamin C (90mg )     losartan (COZAAR) 100 MG tablet Take 100 mg by mouth in the morning.     magnesium oxide (MAG-OX) 400 MG  tablet Take 400 mg by mouth daily at 6 PM. (1700)     Melatonin 10 MG TABS Take 10-20 mg by mouth at bedtime as needed (sleep).     Menaquinone-7 (VITAMIN K2) 100 MCG CAPS Take 100 mcg by mouth daily in the afternoon.     Multiple Vitamins-Minerals (CELEBRATE MULTI-COMPLETE 60 PO) Take 1 capsule by mouth in the morning and at bedtime.     pravastatin (PRAVACHOL) 40 MG tablet Take 40 mg by mouth at bedtime.     predniSONE (DELTASONE) 10 MG tablet 4 tabs for 2 days, then 3 tabs for 2 days, 2 tabs for 2 days, then 1 tab for 2 days, then stop 20 tablet 0   Probiotic Product (PROBIOTIC DAILY PO) Take 1 capsule by mouth daily in the afternoon.     Vitamin D3 (VITAMIN D) 25 MCG tablet Take 1,000 Units by mouth daily.     vitamin k 100 MCG tablet Take 100 mcg by mouth daily.     albuterol (VENTOLIN HFA) 108 (90 Base) MCG/ACT inhaler Inhale 1-2 puffs into the lungs every 6 (six) hours as needed. (Patient not taking: Reported on 09/15/2021) 1 each 2   No facility-administered medications prior to visit.     Review of Systems:   Constitutional:   No  weight loss, night sweats,  Fevers, chills,  +fatigue, or  lassitude.  HEENT:   No headaches,  Difficulty swallowing,  Tooth/dental problems, or  Sore throat,                No sneezing, itching, ear ache,  +nasal congestion, post nasal drip,   CV:  No chest pain,  Orthopnea, PND, swelling in lower extremities, anasarca, dizziness, palpitations, syncope.   GI  No heartburn, indigestion, abdominal pain, nausea, vomiting, diarrhea, change in bowel habits, loss of appetite, bloody stools.   Resp: .  No excess mucus, no productive cough,  No non-productive cough,  No coughing up of blood.  No change in color of mucus.  No wheezing.  No chest wall deformity  Skin: no rash or lesions.  GU: no dysuria, change in color of urine, no urgency or frequency.  No flank pain, no hematuria   MS:  No joint pain or swelling.  No decreased range of motion.  No back  pain.    Physical Exam  BP 138/70 (BP Location: Left Arm, Cuff Size: Large)    Pulse 85    Temp 98.1 F (36.7 C) (Temporal)    Ht 5\' 3"  (1.6 m)    Wt 219 lb 6.4 oz (99.5 kg)    SpO2 99%    BMI 38.86 kg/m   GEN: A/Ox3; pleasant , NAD, well nourished    HEENT:  [place]/AT,   NOSE-clear, THROAT-clear, no lesions, no postnasal drip or exudate noted.   NECK:  Supple w/ fair  ROM; no JVD; normal carotid impulses w/o bruits; no thyromegaly or nodules palpated; no lymphadenopathy.    RESP  Clear  P & A; w/o, wheezes/ rales/ or rhonchi. no accessory muscle use, no dullness to percussion  CARD:  RRR, no m/r/g, no peripheral edema, pulses intact, no cyanosis or clubbing.  GI:   Soft & nt; nml bowel sounds; no organomegaly or masses detected.   Musco: Warm bil, no deformities or joint swelling noted.   Neuro: alert, no focal deficits noted.    Skin: Warm, no lesions or rashes    Lab Results:     BNP No results found for: BNP  ProBNP No results found for: PROBNP  Imaging: DG Chest 2 View  Result Date: 09/07/2021 CLINICAL DATA:  cough variant asthma EXAM: CHEST - 2 VIEW COMPARISON:  October 09, 2018. FINDINGS: Chronic streaky bibasilar opacities. No confluent consolidation. No visible pleural effusions or pneumothorax. Similar mild enlargement the cardiac silhouette. Exaggerated thoracic kyphosis. Right total shoulder arthroplasty. Right axillary/chest wall clips. IMPRESSION: Chronic streaky bibasilar opacities, most likely atelectasis. Electronically Signed   By: Margaretha Sheffield M.D.   On: 09/07/2021 11:50    methylPREDNISolone acetate (DEPO-MEDROL) injection 120 mg     Date Action Dose Route User   09/07/2021 1218 Given 120 mg Intramuscular (Right Ventrogluteal) Nolon Stalls, Kimber Relic, RN       No flowsheet data found.  No results found for: NITRICOXIDE      Assessment & Plan:   Asthma exacerbation Asthma exacerbation-now resolved.  Patient is much improved on her  maintenance regimen.  Check PFTs in 3 weeks as planned. Continue on Symbicort.  Add Claritin for trigger prevention.  Plan  Patient Instructions  Symbicort 160 2 puffs twice daily, rinse after use Albuterol inhaler 1 to 2 puffs every 4-6 hours as needed Albuterol neb every 4-6hr as needed  Claritin 10mg  daily As needed  nasal drainage.  Asthma action plan Follow-up in 4 weeks with Dr. Melvyn Novas or Grabiela Wohlford NP with PFT and As needed   Please contact office for sooner follow up if symptoms do not improve or worsen or seek emergency care          Rexene Edison, NP 09/15/2021

## 2021-10-05 ENCOUNTER — Ambulatory Visit: Payer: Medicare Other | Admitting: Adult Health

## 2021-10-05 ENCOUNTER — Ambulatory Visit (INDEPENDENT_AMBULATORY_CARE_PROVIDER_SITE_OTHER): Payer: Medicare Other | Admitting: Internal Medicine

## 2021-10-05 ENCOUNTER — Other Ambulatory Visit: Payer: Self-pay

## 2021-10-05 DIAGNOSIS — J45991 Cough variant asthma: Secondary | ICD-10-CM | POA: Diagnosis not present

## 2021-10-05 DIAGNOSIS — J4541 Moderate persistent asthma with (acute) exacerbation: Secondary | ICD-10-CM

## 2021-10-05 LAB — PULMONARY FUNCTION TEST
DL/VA % pred: 93 %
DL/VA: 3.87 ml/min/mmHg/L
DLCO cor % pred: 79 %
DLCO cor: 14.65 ml/min/mmHg
DLCO unc % pred: 79 %
DLCO unc: 14.65 ml/min/mmHg
FEF 25-75 Post: 2.21 L/sec
FEF 25-75 Pre: 1.59 L/sec
FEF2575-%Change-Post: 38 %
FEF2575-%Pred-Post: 131 %
FEF2575-%Pred-Pre: 94 %
FEV1-%Change-Post: 7 %
FEV1-%Pred-Post: 87 %
FEV1-%Pred-Pre: 81 %
FEV1-Post: 1.8 L
FEV1-Pre: 1.68 L
FEV1FVC-%Change-Post: 9 %
FEV1FVC-%Pred-Pre: 105 %
FEV6-%Change-Post: -1 %
FEV6-%Pred-Post: 79 %
FEV6-%Pred-Pre: 80 %
FEV6-Post: 2.07 L
FEV6-Pre: 2.1 L
FEV6FVC-%Change-Post: 0 %
FEV6FVC-%Pred-Post: 105 %
FEV6FVC-%Pred-Pre: 104 %
FVC-%Change-Post: -1 %
FVC-%Pred-Post: 75 %
FVC-%Pred-Pre: 77 %
FVC-Post: 2.07 L
FVC-Pre: 2.11 L
Post FEV1/FVC ratio: 87 %
Post FEV6/FVC ratio: 100 %
Pre FEV1/FVC ratio: 80 %
Pre FEV6/FVC Ratio: 100 %
RV % pred: 96 %
RV: 2.1 L
TLC % pred: 89 %
TLC: 4.36 L

## 2021-10-05 NOTE — Progress Notes (Signed)
PFT done today. 

## 2021-10-11 DIAGNOSIS — Z20822 Contact with and (suspected) exposure to covid-19: Secondary | ICD-10-CM | POA: Diagnosis not present

## 2021-10-14 ENCOUNTER — Ambulatory Visit (INDEPENDENT_AMBULATORY_CARE_PROVIDER_SITE_OTHER): Payer: Medicare Other | Admitting: Internal Medicine

## 2021-10-14 ENCOUNTER — Other Ambulatory Visit: Payer: Self-pay

## 2021-10-14 ENCOUNTER — Encounter: Payer: Self-pay | Admitting: Internal Medicine

## 2021-10-14 DIAGNOSIS — J45991 Cough variant asthma: Secondary | ICD-10-CM

## 2021-10-14 NOTE — Patient Instructions (Addendum)
.  Plan A = Automatic = Always=    symbicort 160 Take 2 puffs first thing in am and then another 2 puffs about 12 hours later.  ?  ?Work on inhaler technique:  relax and gently blow all the way out then take a nice smooth full deep breath back in, triggering the inhaler at same time you start breathing in.  Hold for up to 5 seconds if you can. Blow out thru nose. Rinse and gargle with water when done.  If mouth or throat bother you at all,  try brushing teeth/gums/tongue with arm and hammer toothpaste/ make a slurry and gargle and spit out.  ? ? - remember how golfers warm up by taking practice swings  ? ?Plan B = Backup (to supplement plan A, not to replace it) ?Only use your albuterol inhaler as a rescue medication to be used if you can't catch your breath by resting or doing a relaxed purse lip breathing pattern.  ?- The less you use it, the better it will work when you need it. ?- Ok to use the inhaler up to 2 puffs  every 4 hours if you must but call for appointment if use goes up over your usual need ?- Don't leave home without it !!  (think of it like the spare tire for your car)  ? ?Plan C = Crisis (instead of Plan B but only if Plan B stops working) ?- only use your albuterol nebulizer if you first try Plan B and it fails to help > ok to use the nebulizer up to every 4 hours but if start needing it regularly call for immediate appointment ? ? ?Plan D = Doctor ?- call me if B and C not adequate ? ?If having more problems when you move, consider adding singulair 10 mg each pm or seeing an allergist instead of a pulmonary doctor ? ?Pulmonary follow here is as needed and your PCP in [place] country can prescribe your medications when your year of coverage here runs out. ? ?  ?

## 2021-10-14 NOTE — Progress Notes (Signed)
? ?Caitlin [name], female    DOB: 1946-03-16   MRN: [id number]  ? ? ?Brief patient profile:  ?[id number] never smoker asthma as child outgrew it about age teens then some seasonal sinus problems since the 1990s  but around 2007 developed recurrent pattern of breathing problems esp in spring dx as asthma > asmanex and singulair seemed to work ok until around 2012 when had her 1st hernia surgery and since then symptoms persistent year round so 03/27/2012 referred to pulmonary clinic ?  ? ? ? ?History of Present Illness  ?03/27/2012 1st pulmonary ov / Melvyn Novas cc > one year daily p stirs in am (but doesn't wake her) cc sense of  congestion/variable pattern prod of thick sputum/ variable color better p neb with alb/pulmicort.  Sinus spring > fall and not an active problem. Never rx with prednisone. No gagging, some urination.  All started p Feb 2012 p lap chole and several other procedures and coughs so hard it's causing a problem with the wound healing resulting in worsening hernia and has been told she will need more surgery. ?rec ?Nexium 40 mg Take 30-60 min before first meal of the day and Pepcid 20 mg one at bedtime until return ?mucinex dm for cough 2 every 12 hours ?GERD   ?Prednisone 10 mg take  4 each am x 2 days,   2 each am x 2 days,  1 each am x2days and stop  ?Stop asmanex and continue pulmicort with albuterol twice daily > resolved short term  ?  ?  ?  ?  ?03/05/2013 f/u ov/Caitlin [name] re asthma ?    ?Chief Complaint  ?Patient presents with  ? Follow-up  ?    Breathing has improved.  No SOB, wheezing, chest tightenss, chest pain, or cough noted at this time.  ?On  symbicort 160 one twice daily also singulair and never nebulizer any more ?rec ?Ok to stop singulair ?Plan A = automatic = Symbicort 160 Take 1 puffs first thing in am and then another 2 puffs about 12 hours later - if doing great off singulair in a couple of weeks then ok to stop the symbicort too - it will start working in 5 min if needed  ?Plan B = backup ?Only  use your albuterol as a rescue medication to be used ?For cough > mucinex dm 1200 every 12 hours as needed  ?For cough/ congestion > mucinex as needed  ? ? ? ?04/13/2018  Pulmonary/ re-establish re recurrent cough  ?Chief Complaint  ?Patient presents with  ? Pulmonary Consult  ?  Referred by Dr. Hilma Favors. Pt c/o cough for the past months- occ prod with green to yellow sputum.    ?couple times a year tends to flare after stops meds/ never restarted maint symb/ takes advair dpi "prn"  ?S/p gastric sleeve 2015 got down to 172 but slowly increasing ?S/p R breast ca/ rad on Right side only completed July 2017  ?Cough x sev months x  Daytime  Maybe worse in heat/ not after meals or hs  ?Not limited by breathing from desired activities  ?Presently on pred/ abx for L leg rash ? Cellulitis ?rec ?Nexium 40 mg Take 30-60 min before first meal of the day plus  pepcid 20 mg after supper  ?Symbicort 80 Take 2 puffs first thing in am and then another 2 puffs about 12 hours later.  ?Work on inhaler technique:   ?For cough > delsym 2 tsp every 12 hours  ?GERD  Diet ? ? ? ?05/15/2018  f/u ov/Caitlin [name] re:  Cough variant asthma all symptoms resolved on symb 80 2bid  ?Chief Complaint  ?Patient presents with  ? Follow-up  ?  Doing better cough is gone feels symbicort is working but it is expensive.   ?Dyspnea:  Not limited by breathing from desired activities   ?Cough: resolved  ?Sleeping: flat/ one pillow ?SABA use: none ?rec ?Ok to use symbicort 80 up to 2 pffs every 12hours as needed to control your respiratory symptoms  ?If after a week if not 100% better then we need to see you right away  - bring your formulary with you from your insurance  ? ? ?10/09/2018  f/u ov/Caitlin [name] re: cough variant asthma  ?Chief Complaint  ?Patient presents with  ? Acute Visit  ?  Pt c/o chest congestion and cough for the past 10 days. Her cough has been prod at times with green sputum.   ?doing fine off symbicort chronically and  on gerd rx then 10 d prior to [place] then cough/ congestion rx zpak already completed 2 d prior to OV   ?Has neb not using  ?Worse cough p head hits pillows / no fever  ?rec ?Plan A = Automatic = Symbicort 80 Take 2 puffs first thing in am and then another 2 puffs about 12 hours later.  ?Work on inhaler technique:  ?Prednisone 10 mg take  4 each am x 2 days,   2 each am x 2 days,  1 each am x 2 days and stop - take with breakfast  ?Plan B = Backup ?- only use your albuterol nebulizer if you first try Plan B and it fails to help > ok to use the nebulizer up to every 4 hours but if start needing it regularly call for immediate appointment ? ? ? ?07/16/2019  f/u ov/Caitlin [name] re: cough variant asthma doing great on symb or dulera or breztri but having cost issues  ?Chief Complaint  ?Patient presents with  ? Follow-up  ?  Pt is here today to discuss inhalers to try to change to one that is covered by insurance. Pt states her breathing has been doing fine.  ?Dyspnea:  Not limited by breathing from desired activities   ?Cough: all better on breztri ?Sleeping: sleep number < 30 degrees  ?SABA use: none  ?02: none  ?Rec ?Call me with the alternatives on Drug Formulary for inhalers (not powders) before you present samples run out  ?Pulmonary follow up is as needed   ? ?NP recs 09/15/21  ?Symbicort 160 2 puffs twice daily, rinse after use ?Albuterol inhaler 1 to 2 puffs every 4-6 hours as needed ?Albuterol neb every 4-6hr as needed  ?Claritin 10 mg daily As needed  nasal drainage.  ?Asthma action plan ?Follow-up in 4 weeks with Dr. Melvyn Novas  with PFT and As needed   ? ? ?10/14/2021  f/u ov/[place] [facility]/Caitlin [name] re: asthma maint on symbicort  160  ?Chief Complaint  ?Patient presents with  ? Follow-up  ?  PFT done on 10/05/2021.  ?Feels breathing is same since last ov   ? Dyspnea:  Not limited by breathing from desired activities  but very sedentary most of the time  ?Cough: none  ?Sleeping: sleep number about 30 degrees  ?SABA use: hfa once rarely, rarely neb  ?02:  none ?Covid status: up to date / never infected  ?  ?No obvious day to day or daytime variability or assoc excess/  purulent sputum or mucus plugs or hemoptysis or cp or chest tightness, subjective wheeze or overt sinus or hb symptoms.  ? ?Sleeping  without nocturnal  or early am exacerbation  of respiratory  c/o's or need for noct saba. Also denies any obvious fluctuation of symptoms with weather or environmental changes or other aggravating or alleviating factors except as outlined above  ? ?No unusual exposure hx or h/o childhood pna or knowledge of premature birth. ? ?Current Allergies, Complete Past Medical History, Past Surgical History, Family History, and Social History were reviewed in [employer] record. ? ?ROS  The following are not active complaints unless bolded ?Hoarseness, sore throat, dysphagia, dental problems, itching, sneezing,  nasal congestion or discharge of excess mucus or purulent secretions, ear ache,   fever, chills, sweats, unintended wt loss or wt gain, classically pleuritic or exertional cp,  orthopnea pnd or arm/hand swelling  or leg swelling, presyncope, palpitations, abdominal pain, anorexia, nausea, vomiting, diarrhea  or change in bowel habits or change in bladder habits, change in stools or change in urine, dysuria, hematuria,  rash, arthralgias, visual complaints, headache, numbness, weakness or ataxia or problems with walking or coordination,  change in mood or  memory. ?      ? ?Current Meds  ?Medication Sig  ? albuterol (PROVENTIL) (2.5 MG/3ML) 0.083% nebulizer solution Take 3 mLs (2.5 mg total) by nebulization every 6 (six) hours as needed for wheezing or shortness of breath.  ? albuterol (VENTOLIN HFA) 108 (90 Base) MCG/ACT inhaler Inhale 1-2 puffs into the lungs every 6 (six) hours as needed.  ? amoxicillin (AMOXIL) 500 MG capsule Take 500 mg by mouth 3 (three) times daily.  ? budesonide-formoterol (SYMBICORT) 160-4.5 MCG/ACT inhaler Inhale 2 puffs into  the lungs 2 (two) times daily.  ? calcium citrate-vitamin D (CALCIUM CITRATE CHEWY BITE) 500-500 MG-UNIT chewable tablet Chew 1 tablet by mouth in the morning. Bariatric - Calcium Citrate Chewy Bites '500mg'$  w

## 2021-10-14 NOTE — Assessment & Plan Note (Addendum)
Onset in childhood  ?- Eos 0.3 04/07/18 ?- d/c advair 04/13/2018  ?- 04/13/2018   try symb 80 2bid  ?- Allergy profile 05/15/2018 >  Eos 0.3 /  IgE  1227 RAST pos to everything but cats and mold  ?- 05/15/2018    change symbicort to 80 2bid "prn"  ?- 10/09/2018  After extensive coaching inhaler device,  effectiveness =   75% from a baseline of 25% ?- 11/20/2018 E-visit Cough resolved after zpack and prednisone. Continue symbicort 80 once daily. Add daily antihistamine during peak allergy season. ?- PFT's  10/05/21 no obst prior to amb symbicort and  ERV 29% at wt 220    ?- 10/14/2021  After extensive coaching inhaler device,  effectiveness =    75% (late trigger, short ti)> advised practice with empty inhaler  ? ?All goals of chronic asthma control met including optimal function and elimination of symptoms with minimal need for rescue therapy. ? ?Contingencies discussed in full including contacting this office immediately if not controlling the symptoms using the rule of two's.    ? ?If getting worse would consider challenge with singulair and if not responding then allergy referral next step (moving 2 h from here so can be done by new PCP) ?

## 2021-10-14 NOTE — Assessment & Plan Note (Addendum)
PFT's  10/05/21   ERV 29% at wt 220 c/w body habitus ? ?Body mass index is 40.71 kg/m?.  -  trending up  ?No results found for: TSH  ? ? ?Contributing to doe, low ERV,  and risk of GERD ?>>>   reviewed the need and the process to achieve and maintain neg calorie balance > defer f/u primary care including intermittently monitoring thyroid status    ? ?    ?  ? ?Each maintenance medication was reviewed in detail including emphasizing most importantly the difference between maintenance and prns and under what circumstances the prns are to be triggered using an action plan format where appropriate. ? ?Total time for H and P, chart review, counseling, reviewing ABC plan and when to use  hfa/neb device(s) and generating customized AVS unique to this summary final office visit / same day charting = 30 min  ?     ?

## 2021-11-08 DIAGNOSIS — M79675 Pain in left toe(s): Secondary | ICD-10-CM | POA: Diagnosis not present

## 2021-11-08 DIAGNOSIS — M79672 Pain in left foot: Secondary | ICD-10-CM | POA: Diagnosis not present

## 2021-11-08 DIAGNOSIS — L110 Acquired keratosis follicularis: Secondary | ICD-10-CM | POA: Diagnosis not present

## 2021-11-08 DIAGNOSIS — M79671 Pain in right foot: Secondary | ICD-10-CM | POA: Diagnosis not present

## 2021-11-08 DIAGNOSIS — M79674 Pain in right toe(s): Secondary | ICD-10-CM | POA: Diagnosis not present

## 2021-11-08 DIAGNOSIS — I739 Peripheral vascular disease, unspecified: Secondary | ICD-10-CM | POA: Diagnosis not present

## 2021-11-09 DIAGNOSIS — R300 Dysuria: Secondary | ICD-10-CM | POA: Diagnosis not present

## 2021-11-09 DIAGNOSIS — I872 Venous insufficiency (chronic) (peripheral): Secondary | ICD-10-CM | POA: Diagnosis not present

## 2021-11-09 DIAGNOSIS — E6609 Other obesity due to excess calories: Secondary | ICD-10-CM | POA: Diagnosis not present

## 2021-11-09 DIAGNOSIS — Z6837 Body mass index (BMI) 37.0-37.9, adult: Secondary | ICD-10-CM | POA: Diagnosis not present

## 2021-11-16 DIAGNOSIS — Z20822 Contact with and (suspected) exposure to covid-19: Secondary | ICD-10-CM | POA: Diagnosis not present

## 2021-11-27 DIAGNOSIS — Z20822 Contact with and (suspected) exposure to covid-19: Secondary | ICD-10-CM | POA: Diagnosis not present

## 2021-11-30 DIAGNOSIS — Z20822 Contact with and (suspected) exposure to covid-19: Secondary | ICD-10-CM | POA: Diagnosis not present

## 2021-12-06 DIAGNOSIS — L03032 Cellulitis of left toe: Secondary | ICD-10-CM | POA: Diagnosis not present

## 2021-12-06 DIAGNOSIS — M79675 Pain in left toe(s): Secondary | ICD-10-CM | POA: Diagnosis not present

## 2021-12-06 DIAGNOSIS — M79672 Pain in left foot: Secondary | ICD-10-CM | POA: Diagnosis not present

## 2021-12-16 ENCOUNTER — Ambulatory Visit
Admission: RE | Admit: 2021-12-16 | Discharge: 2021-12-16 | Disposition: A | Discharge status: Home / Self Care | Payer: Medicare Other | Source: Ambulatory Visit | Attending: Nurse Practitioner | Admitting: Nurse Practitioner

## 2021-12-16 VITALS — BP 155/81 | HR 85 | Temp 97.9°F | Resp 18

## 2021-12-16 DIAGNOSIS — M5442 Lumbago with sciatica, left side: Secondary | ICD-10-CM | POA: Diagnosis not present

## 2021-12-16 LAB — POCT URINALYSIS DIP (MANUAL ENTRY)
Bilirubin, UA: NEGATIVE
Blood, UA: NEGATIVE
Glucose, UA: NEGATIVE mg/dL
Ketones, POC UA: NEGATIVE mg/dL
Nitrite, UA: NEGATIVE
Protein Ur, POC: NEGATIVE mg/dL
Spec Grav, UA: 1.01 (ref 1.010–1.025)
Urobilinogen, UA: 0.2 E.U./dL
pH, UA: 6.5 (ref 5.0–8.0)

## 2021-12-16 MED ORDER — PREDNISONE 20 MG PO TABS: 40.0000 mg | ORAL_TABLET | Freq: Every day | ORAL | 0 refills | Status: AC

## 2021-12-16 NOTE — ED Provider Notes (Signed)
[facility]    CSN: [id number] Arrival date & time: 12/16/21  1748      History   Chief Complaint Chief Complaint  Patient presents with   Back Pain    Entered by patient   Appointment    1800    HPI Caitlin [name] is a 76 y.o. female.   Patient is a 76 year old female who presents with left-sided low back pain.  Symptoms have been present for the past 4 days.  Patient states that she has noticed that the pain was around her flank and now has traveled into her lower back and buttocks.  She does have a history of recurrent urinary tract infections.  She states that deep breathing and coughing make her back pain worse.  She denies fever, chills, abdominal pain, loss of bowel or bladder function, lower extremity weakness, flank pain, nausea, vomiting, or diarrhea.  Patient has not taken any medication for her symptoms.  Denies any previous injury or trauma to her back.  States she recently finished a course of antibiotics approximately 5 to 6 days ago prescribed by her podiatrist.  The history is provided by the patient and the spouse.   Past Medical History:  Diagnosis Date   Arthritis    Asthma    Atypical mole 04/22/2020   mod-left lower back   Breast cancer ([place])    Breast disorder    cancer   Breast mass    benign   Cellulitis    Family history of breast cancer 09/09/2020   Family history of pancreatic cancer 09/09/2020   Family history of prostate cancer 09/09/2020   GERD (gastroesophageal reflux disease)    Heart murmur    AS CHILD  OUTGREW IT   History of hiatal hernia    Hyperlipidemia    Hypertension    Impaired fasting glucose    Rectocele    Varicose vein of leg     Patient Active Problem List   Diagnosis Date Noted   Asthma exacerbation 09/07/2021   Genetic testing 10/06/2020   Family history of breast cancer 09/09/2020   Family history of pancreatic cancer 09/09/2020   Family history of rectal cancer 09/09/2020   Family history of prostate  cancer 09/09/2020   Impingement syndrome of left shoulder 07/15/2020   S/P reverse total shoulder arthroplasty, right 03/03/2020   Primary osteoarthritis, right shoulder 12/04/2019   Chronic right shoulder pain 11/29/2018   Localized swelling of left lower extremity 07/19/2018   Cellulitis and abscess of left leg 06/15/2018   Unilateral primary osteoarthritis, left knee 05/02/2017   S/P total knee replacement using cement, left 05/02/2017   Primary osteoarthritis of left knee 12/21/2016   Chondromalacia patellae, left knee 05/26/2016   Breast cancer of upper-inner quadrant of right female breast ([place]) 11/04/2015   GERD (gastroesophageal reflux disease) 09/02/2013   Morbid obesity due to excess calories ([place]) 09/02/2013   Ventral hernia, recurrent 09/02/2013   Unspecified asthma(493.90) 12/12/2012   Cough variant asthma 03/27/2012   Right middle lobe syndrome 03/27/2012    Past Surgical History:  Procedure Laterality Date   BREAST LUMPECTOMY WITH RADIOACTIVE SEED AND SENTINEL LYMPH NODE BIOPSY Right 11/11/2015   Procedure: RIGHT BREAST RADIOACTIVE SEED GUIDED  LUMPECTOMY AND RADIOACTIVE SEED TARGETED SENTINEL LYMPH NODE BIOPSY;  Surgeon: Stark Klein, MD;  Location: [facility];  Service: General;  Laterality: Right;   BREAST SURGERY Left    breast biopies x4   CHOLECYSTECTOMY  09/08/2010   COLONOSCOPY  COLONOSCOPY N/A 09/25/2013   Procedure: COLONOSCOPY;  Surgeon: Rogene Houston, MD;  [facility];  Service: Endoscopy;  Laterality: N/A;  100-moved to 1200 Ann to notify pt   COLONOSCOPY N/A 11/26/2020   Procedure: COLONOSCOPY;  Surgeon: Rogene Houston, MD;  [facility];  Service: Endoscopy;  Laterality: N/A;  am   HERNIA REPAIR  12/24/2010, 12-2014   TOTAL 4   LAPAROSCOPIC GASTRIC SLEEVE RESECTION  11-2013   at [facility]  2007/2008   POLYPECTOMY  11/26/2020   Procedure: POLYPECTOMY;  Surgeon: Rogene Houston,  MD;  [facility];  Service: Endoscopy;;   REVERSE SHOULDER ARTHROPLASTY Right 03/03/2020   Procedure: RIGHT REVERSE SHOULDER ARTHROPLASTY;  Surgeon: Meredith Pel, MD;  Location: [facility];  Service: Orthopedics;  Laterality: Right;   TONSILLECTOMY  1968   TOTAL KNEE ARTHROPLASTY Left 05/02/2017   TOTAL KNEE ARTHROPLASTY Left 05/02/2017   Procedure: LEFT TOTAL KNEE ARTHROPLASTY;  Surgeon: [name], MD;  Location: [facility];  Service: Orthopedics;  Laterality: Left;   UPPER GASTROINTESTINAL ENDOSCOPY     VAGINAL HYSTERECTOMY  1980s    OB History     Gravida  2   Para  2   Term  2   Preterm      AB      Living  2      SAB      IAB      Ectopic      Multiple      Live Births  2            Home Medications    Prior to Admission medications   Medication Sig Start Date End Date Taking? Authorizing Provider  predniSONE (DELTASONE) 20 MG tablet Take 2 tablets (40 mg total) by mouth daily with breakfast for 5 days. 12/16/21 12/21/21 Yes Anubis Fundora-Warren, Alda Lea, NP  albuterol (PROVENTIL) (2.5 MG/3ML) 0.083% nebulizer solution Take 3 mLs (2.5 mg total) by nebulization every 6 (six) hours as needed for wheezing or shortness of breath. 09/07/21 09/07/22  Parrett, Fonnie Mu, NP  albuterol (VENTOLIN HFA) 108 (90 Base) MCG/ACT inhaler Inhale 1-2 puffs into the lungs every 6 (six) hours as needed. 09/07/21   Parrett, Fonnie Mu, NP  amoxicillin (AMOXIL) 500 MG capsule Take 500 mg by mouth 3 (three) times daily. 10/07/21   [provider]  budesonide-formoterol (SYMBICORT) 160-4.5 MCG/ACT inhaler Inhale 2 puffs into the lungs 2 (two) times daily. 09/07/21   Parrett, Fonnie Mu, NP  calcium citrate-vitamin D (CALCIUM CITRATE CHEWY BITE) 500-500 MG-UNIT chewable tablet Chew 1 tablet by mouth in the morning. Bariatric - Calcium Citrate Chewy Bites '500mg'$  w/D3    [provider]  Cyanocobalamin (VITAMIN B-12) 2500 MCG SUBL Place 2,500 mcg under the tongue in the morning.     [provider]  dextromethorphan-guaiFENesin (MUCINEX DM) 30-600 MG 12hr tablet Take 1 tablet by mouth at bedtime as needed (cough/congestion).    [provider]  esomeprazole (NEXIUM) 20 MG capsule Take 40 mg by mouth daily before breakfast.     [provider]  famotidine (PEPCID) 20 MG tablet Take 20 mg by mouth daily in the afternoon.    [provider]  hydrochlorothiazide (HYDRODIURIL) 25 MG tablet Take 25 mg by mouth in the morning. 10/20/20   [provider]  IRON-VITAMIN C PO Take 1 tablet by mouth at bedtime. (Chewable) Celebrate Vitamins Iron+C+ Iron ('54mg'$ )+vitamin C ('90mg'$ )  [provider]  losartan (COZAAR) 100 MG tablet Take 100 mg by mouth in the morning. 10/20/20   [provider]  magnesium oxide (MAG-OX) 400 MG tablet Take 400 mg by mouth daily at 6 PM. (1700)    [provider]  Melatonin 10 MG TABS Take 10-20 mg by mouth at bedtime as needed (sleep).    [provider]  Menaquinone-7 (VITAMIN K2) 100 MCG CAPS Take 100 mcg by mouth daily in the afternoon.    [provider]  Multiple Vitamins-Minerals (CELEBRATE MULTI-COMPLETE 60 PO) Take 1 capsule by mouth in the morning and at bedtime.    [provider]  pravastatin (PRAVACHOL) 40 MG tablet Take 40 mg by mouth at bedtime. 04/10/19   [provider]  Probiotic Product (PROBIOTIC DAILY PO) Take 1 capsule by mouth daily in the afternoon.    [provider]  Vitamin D3 (VITAMIN D) 25 MCG tablet Take 1,000 Units by mouth daily.    [provider]  vitamin k 100 MCG tablet Take 100 mcg by mouth daily.    [provider]    Family History Family History  Problem Relation Age of Onset   Rectal cancer Father        dx 49s   Prostate cancer Father        dx 35s   Dementia Mother    Osteoporosis Mother    Breast cancer Daughter 60       triple negative   Healthy Son    Pancreatic cancer  Paternal Grandfather        dx 56s-60s   Heart disease Paternal Grandmother    Heart disease Maternal Grandfather    Breast cancer Maternal Grandmother        dx 68s   Scoliosis Sister    Cancer Sister        face/neck ?skin    Social History Social History   Tobacco Use   Smoking status: Never   Smokeless tobacco: Never  Vaping Use   Vaping Use: Never used  Substance Use Topics   Alcohol use: No   Drug use: No     Allergies   Horse-derived products, Sulfa antibiotics, Sulfamethoxazole-trimethoprim, and Tetanus toxoid adsorbed   Review of Systems Review of Systems  Constitutional: Negative.   Respiratory: Negative.    Cardiovascular: Negative.   Gastrointestinal: Negative.   Musculoskeletal:  Positive for back pain (left-sided low back pain).  Skin: Negative.   Psychiatric/Behavioral: Negative.      Physical Exam Triage Vital Signs ED Triage Vitals  Enc Vitals Group     BP 12/16/21 1830 (!) 155/81     Pulse Rate 12/16/21 1830 85     Resp 12/16/21 1830 18     Temp 12/16/21 1830 97.9 F (36.6 C)     Temp Source 12/16/21 1830 Oral     SpO2 12/16/21 1830 94 %     Weight --      Height --      Head Circumference --      Peak Flow --      Pain Score 12/16/21 1829 6     Pain Loc --      Pain Edu? --      Excl. in [place]? --    No data found.  Updated Vital Signs BP (!) 155/81 (BP Location: Right Arm)   Pulse 85   Temp 97.9 F (36.6 C) (Oral)   Resp 18   SpO2 94%  Visual Acuity Right Eye Distance:   Left Eye Distance:   Bilateral Distance:    Right Eye Near:   Left Eye Near:    Bilateral Near:     Physical Exam Vitals and nursing note reviewed.  Constitutional:      General: She is not in acute distress.    Appearance: She is well-developed.  Eyes:     Extraocular Movements: Extraocular movements intact.     Pupils: Pupils are equal, round, and reactive to light.  Cardiovascular:     Rate and Rhythm: Normal rate and regular rhythm.      Heart sounds: Normal heart sounds.  Pulmonary:     Effort: Pulmonary effort is normal.     Breath sounds: Normal breath sounds.  Abdominal:     General: Bowel sounds are normal. There is no distension.     Palpations: Abdomen is soft.     Tenderness: There is no abdominal tenderness. There is no guarding or rebound.  Genitourinary:    Vagina: Normal. No vaginal discharge.  Musculoskeletal:     Cervical back: Normal range of motion.     Lumbar back: Tenderness (left lower back, flank) present. No swelling, deformity or signs of trauma. Decreased range of motion. Negative right straight leg raise test and negative left straight leg raise test.  Skin:    General: Skin is warm and dry.     Findings: No erythema or rash.  Neurological:     General: No focal deficit present.     Mental Status: She is alert and oriented to person, place, and time.     Cranial Nerves: No cranial nerve deficit.  Psychiatric:        Mood and Affect: Mood normal.        Behavior: Behavior normal.     UC Treatments / Results  Labs (all labs ordered are listed, but only abnormal results are displayed) Labs Reviewed  POCT URINALYSIS DIP (MANUAL ENTRY) - Abnormal; Notable for the following components:      Result Value   Clarity, UA hazy (*)    Leukocytes, UA Small (1+) (*)    All other components within normal limits    EKG   Radiology No results found.  Procedures Procedures (including critical care time)  Medications Ordered in UC Medications - No data to display  Initial Impression / Assessment and Plan / UC Course  I have reviewed the triage vital signs and the nursing notes.  Pertinent labs & imaging results that were available during my care of the patient were reviewed by me and considered in my medical decision making (see chart for details).  The patient presents with complaints of left-sided low back pain.  Symptoms have been present for the past 4 days.  She does not recall any  previous injury or trauma to her back.  Patient also suspects that she may have a urinary tract infection as she has a history of recurrent UTIs.  Her urinalysis does show leukocytes; however, cannot determine if this is caused by contamination of the sample or a true infection as patient does not complain of any urinary symptoms.  Cannot rule out left-sided sciatica at this time as patient complains that pain worsens with coughing and deep breathing and radiates down into her buttock.  Patient is in agreement with starting steroids to see if this helps with her back pain.  In the interim, urine culture has been ordered to determine if she has a urinary tract  infection.  Also will wait on starting antibiotics as patient just finished a course of antibiotics prescribed by her podiatrist less than 1 week ago.  Patient was advised to use ice or heat as needed, instruction was provided.  Strict return precautions were given to the patient of when to return to our clinic versus going to the emergency department.  Patient verbalizes understanding, all questions were answered. Final Clinical Impressions(s) / UC Diagnoses   Final diagnoses:  Acute left-sided low back pain with left-sided sciatica     Discharge Instructions      As discussed, we will send your urine for a urine culture.  If the results are positive, you will be contacted to provide treatment. Take medication as prescribed. You may use ice or heat as needed.  Ice issues for pain or swelling, heat for spasm or stiffness.  Apply for 20 minutes, remove for 1 hour, then repeat. If you need to take anything additional while you are on the steroids, recommend arthritis strength Tylenol, which is 650 mg tablet.  Take as needed. When she finished the steroid, you can start taking ibuprofen as needed if your pain persist. Try to stay as active as possible.  I am also giving you some sciatica exercises to do at home. Follow-up in the emergency  department if you have loss of bowel or bladder function, inability to walk, fever, chills, abdominal pain, or change in your bowel pattern.     ED Prescriptions     Medication Sig Dispense Auth. Provider   predniSONE (DELTASONE) 20 MG tablet Take 2 tablets (40 mg total) by mouth daily with breakfast for 5 days. 10 tablet Gianina Olinde-Warren, Alda Lea, NP      PDMP not reviewed this encounter.   Tish Men, NP 12/16/21 2029

## 2021-12-16 NOTE — ED Triage Notes (Signed)
Pt reports lower back pain, travels to left buttock x 5 days. Denies any discomfort when urinating, increased urinary frequency, chills, fever.

## 2021-12-16 NOTE — Discharge Instructions (Addendum)
As discussed, we will send your urine for a urine culture.  If the results are positive, you will be contacted to provide treatment. Take medication as prescribed. You may use ice or heat as needed.  Ice issues for pain or swelling, heat for spasm or stiffness.  Apply for 20 minutes, remove for 1 hour, then repeat. If you need to take anything additional while you are on the steroids, recommend arthritis strength Tylenol, which is 650 mg tablet.  Take as needed. When she finished the steroid, you can start taking ibuprofen as needed if your pain persist. Try to stay as active as possible.  I am also giving you some sciatica exercises to do at home. Follow-up in the emergency department if you have loss of bowel or bladder function, inability to walk, fever, chills, abdominal pain, or change in your bowel pattern.

## 2021-12-17 ENCOUNTER — Telehealth (INDEPENDENT_AMBULATORY_CARE_PROVIDER_SITE_OTHER): Payer: Medicare Other | Admitting: Emergency Medicine

## 2021-12-17 ENCOUNTER — Telehealth: Payer: Self-pay | Admitting: Emergency Medicine

## 2021-12-17 ENCOUNTER — Ambulatory Visit
Admission: EM | Admit: 2021-12-17 | Discharge: 2021-12-17 | Disposition: A | Discharge status: Home / Self Care | Payer: Medicare Other | Attending: Family Medicine | Admitting: Family Medicine

## 2021-12-17 DIAGNOSIS — R300 Dysuria: Secondary | ICD-10-CM | POA: Insufficient documentation

## 2021-12-17 DIAGNOSIS — M5442 Lumbago with sciatica, left side: Secondary | ICD-10-CM

## 2021-12-17 LAB — POCT URINALYSIS DIPSTICK
Bilirubin, UA: NEGATIVE
Blood, UA: NEGATIVE
Glucose, UA: NEGATIVE
Ketones, UA: NEGATIVE
Nitrite, UA: NEGATIVE
Protein, UA: NEGATIVE
Spec Grav, UA: 1.015 (ref 1.010–1.025)
Urobilinogen, UA: 0.2 E.U./dL
pH, UA: 7 (ref 5.0–8.0)

## 2021-12-17 MED ORDER — CEPHALEXIN 500 MG PO CAPS: 500.0000 mg | ORAL_CAPSULE | Freq: Four times a day (QID) | ORAL | 0 refills | Status: AC

## 2021-12-17 NOTE — Telephone Encounter (Signed)
Patient came for urinary collection today, still has small leuks present, urine culture pending.  Given her worsening urinary symptoms reported we will treat with Keflex while awaiting these results.

## 2021-12-17 NOTE — Telephone Encounter (Signed)
Patient called back about urine culture results.  A urine culture was not ordered.  Patient returned to give a repeat specimen

## 2021-12-20 ENCOUNTER — Other Ambulatory Visit (HOSPITAL_COMMUNITY): Payer: Self-pay

## 2021-12-20 DIAGNOSIS — C50211 Malignant neoplasm of upper-inner quadrant of right female breast: Secondary | ICD-10-CM

## 2021-12-20 DIAGNOSIS — E559 Vitamin D deficiency, unspecified: Secondary | ICD-10-CM

## 2021-12-20 LAB — URINE CULTURE: Culture: 20000 — AB

## 2021-12-21 ENCOUNTER — Inpatient Hospital Stay (HOSPITAL_COMMUNITY): Payer: Medicare Other | Attending: Hematology

## 2021-12-21 DIAGNOSIS — Z170 Estrogen receptor positive status [ER+]: Secondary | ICD-10-CM | POA: Diagnosis not present

## 2021-12-21 DIAGNOSIS — E559 Vitamin D deficiency, unspecified: Secondary | ICD-10-CM

## 2021-12-21 DIAGNOSIS — C50211 Malignant neoplasm of upper-inner quadrant of right female breast: Secondary | ICD-10-CM | POA: Insufficient documentation

## 2021-12-21 DIAGNOSIS — Z79811 Long term (current) use of aromatase inhibitors: Secondary | ICD-10-CM | POA: Diagnosis not present

## 2021-12-21 DIAGNOSIS — M8580 Other specified disorders of bone density and structure, unspecified site: Secondary | ICD-10-CM | POA: Diagnosis not present

## 2021-12-21 LAB — CBC WITH DIFFERENTIAL/PLATELET
Abs Immature Granulocytes: 0.04 10*3/uL (ref 0.00–0.07)
Basophils Absolute: 0 10*3/uL (ref 0.0–0.1)
Basophils Relative: 0 %
Eosinophils Absolute: 0 10*3/uL (ref 0.0–0.5)
Eosinophils Relative: 0 %
HCT: 36.7 % (ref 36.0–46.0)
Hemoglobin: 12.4 g/dL (ref 12.0–15.0)
Immature Granulocytes: 1 %
Lymphocytes Relative: 8 %
Lymphs Abs: 0.7 10*3/uL (ref 0.7–4.0)
MCH: 34.1 pg — ABNORMAL HIGH (ref 26.0–34.0)
MCHC: 33.8 g/dL (ref 30.0–36.0)
MCV: 100.8 fL — ABNORMAL HIGH (ref 80.0–100.0)
Monocytes Absolute: 0.2 10*3/uL (ref 0.1–1.0)
Monocytes Relative: 2 %
Neutro Abs: 7.9 10*3/uL — ABNORMAL HIGH (ref 1.7–7.7)
Neutrophils Relative %: 89 %
Platelets: 264 10*3/uL (ref 150–400)
RBC: 3.64 MIL/uL — ABNORMAL LOW (ref 3.87–5.11)
RDW: 12.2 % (ref 11.5–15.5)
WBC: 8.9 10*3/uL (ref 4.0–10.5)
nRBC: 0 % (ref 0.0–0.2)

## 2021-12-21 LAB — COMPREHENSIVE METABOLIC PANEL
ALT: 33 U/L (ref 0–44)
AST: 39 U/L (ref 15–41)
Albumin: 4 g/dL (ref 3.5–5.0)
Alkaline Phosphatase: 102 U/L (ref 38–126)
Anion gap: 9 (ref 5–15)
BUN: 14 mg/dL (ref 8–23)
CO2: 25 mmol/L (ref 22–32)
Calcium: 9.2 mg/dL (ref 8.9–10.3)
Chloride: 95 mmol/L — ABNORMAL LOW (ref 98–111)
Creatinine, Ser: 0.72 mg/dL (ref 0.44–1.00)
GFR, Estimated: >60 mL/min (ref >60)
Glucose, Bld: 183 mg/dL — ABNORMAL HIGH (ref 70–99)
Potassium: 3.9 mmol/L (ref 3.5–5.1)
Sodium: 129 mmol/L — ABNORMAL LOW (ref 135–145)
Total Bilirubin: 0.4 mg/dL (ref 0.3–1.2)
Total Protein: 7.2 g/dL (ref 6.5–8.1)

## 2021-12-21 LAB — VITAMIN D 25 HYDROXY (VIT D DEFICIENCY, FRACTURES): Vit D, 25-Hydroxy: 51.08 ng/mL (ref 30–100)

## 2021-12-24 DIAGNOSIS — R350 Frequency of micturition: Secondary | ICD-10-CM | POA: Diagnosis not present

## 2021-12-24 DIAGNOSIS — N390 Urinary tract infection, site not specified: Secondary | ICD-10-CM | POA: Diagnosis not present

## 2021-12-24 DIAGNOSIS — N3281 Overactive bladder: Secondary | ICD-10-CM | POA: Diagnosis not present

## 2021-12-28 ENCOUNTER — Other Ambulatory Visit (HOSPITAL_COMMUNITY): Payer: Self-pay | Admitting: Hematology

## 2021-12-28 ENCOUNTER — Inpatient Hospital Stay (HOSPITAL_COMMUNITY): Payer: Medicare Other | Admitting: Hematology

## 2021-12-28 DIAGNOSIS — C50211 Malignant neoplasm of upper-inner quadrant of right female breast: Secondary | ICD-10-CM

## 2021-12-28 DIAGNOSIS — Z1231 Encounter for screening mammogram for malignant neoplasm of breast: Secondary | ICD-10-CM

## 2021-12-30 ENCOUNTER — Other Ambulatory Visit: Payer: Self-pay | Admitting: Family Medicine

## 2021-12-30 ENCOUNTER — Other Ambulatory Visit (HOSPITAL_COMMUNITY): Payer: Self-pay | Admitting: Family Medicine

## 2021-12-30 DIAGNOSIS — L110 Acquired keratosis follicularis: Secondary | ICD-10-CM | POA: Diagnosis not present

## 2021-12-30 DIAGNOSIS — M79675 Pain in left toe(s): Secondary | ICD-10-CM | POA: Diagnosis not present

## 2021-12-30 DIAGNOSIS — M79672 Pain in left foot: Secondary | ICD-10-CM | POA: Diagnosis not present

## 2021-12-30 DIAGNOSIS — M79671 Pain in right foot: Secondary | ICD-10-CM | POA: Diagnosis not present

## 2021-12-30 DIAGNOSIS — M2042 Other hammer toe(s) (acquired), left foot: Secondary | ICD-10-CM | POA: Diagnosis not present

## 2021-12-30 DIAGNOSIS — M79674 Pain in right toe(s): Secondary | ICD-10-CM | POA: Diagnosis not present

## 2021-12-30 DIAGNOSIS — M545 Low back pain, unspecified: Secondary | ICD-10-CM

## 2022-01-03 ENCOUNTER — Emergency Department (HOSPITAL_COMMUNITY): Payer: Medicare Other

## 2022-01-03 ENCOUNTER — Encounter (HOSPITAL_COMMUNITY): Payer: Self-pay | Admitting: *Deleted

## 2022-01-03 ENCOUNTER — Emergency Department (HOSPITAL_COMMUNITY)
Admission: EM | Admit: 2022-01-03 | Discharge: 2022-01-03 | Disposition: A | Discharge status: Home / Self Care | Payer: Medicare Other | Attending: Emergency Medicine | Admitting: Emergency Medicine

## 2022-01-03 ENCOUNTER — Other Ambulatory Visit: Payer: Self-pay

## 2022-01-03 ENCOUNTER — Ambulatory Visit (HOSPITAL_COMMUNITY)
Admission: RE | Admit: 2022-01-03 | Discharge: 2022-01-03 | Disposition: A | Discharge status: Home / Self Care | Payer: Medicare Other | Source: Ambulatory Visit | Attending: Hematology | Admitting: Hematology

## 2022-01-03 DIAGNOSIS — Z7951 Long term (current) use of inhaled steroids: Secondary | ICD-10-CM | POA: Diagnosis not present

## 2022-01-03 DIAGNOSIS — J45909 Unspecified asthma, uncomplicated: Secondary | ICD-10-CM | POA: Insufficient documentation

## 2022-01-03 DIAGNOSIS — Z853 Personal history of malignant neoplasm of breast: Secondary | ICD-10-CM | POA: Insufficient documentation

## 2022-01-03 DIAGNOSIS — M5459 Other low back pain: Secondary | ICD-10-CM | POA: Diagnosis not present

## 2022-01-03 DIAGNOSIS — Z1231 Encounter for screening mammogram for malignant neoplasm of breast: Secondary | ICD-10-CM | POA: Insufficient documentation

## 2022-01-03 DIAGNOSIS — Z79899 Other long term (current) drug therapy: Secondary | ICD-10-CM | POA: Diagnosis not present

## 2022-01-03 DIAGNOSIS — K4090 Unilateral inguinal hernia, without obstruction or gangrene, not specified as recurrent: Secondary | ICD-10-CM | POA: Diagnosis not present

## 2022-01-03 DIAGNOSIS — R109 Unspecified abdominal pain: Secondary | ICD-10-CM | POA: Insufficient documentation

## 2022-01-03 DIAGNOSIS — K5730 Diverticulosis of large intestine without perforation or abscess without bleeding: Secondary | ICD-10-CM | POA: Diagnosis not present

## 2022-01-03 DIAGNOSIS — R531 Weakness: Secondary | ICD-10-CM | POA: Diagnosis not present

## 2022-01-03 DIAGNOSIS — I10 Essential (primary) hypertension: Secondary | ICD-10-CM | POA: Insufficient documentation

## 2022-01-03 DIAGNOSIS — R000 Tachycardia, unspecified: Secondary | ICD-10-CM | POA: Diagnosis not present

## 2022-01-03 DIAGNOSIS — M47816 Spondylosis without myelopathy or radiculopathy, lumbar region: Secondary | ICD-10-CM | POA: Diagnosis not present

## 2022-01-03 DIAGNOSIS — M545 Low back pain, unspecified: Secondary | ICD-10-CM | POA: Insufficient documentation

## 2022-01-03 DIAGNOSIS — M48061 Spinal stenosis, lumbar region without neurogenic claudication: Secondary | ICD-10-CM | POA: Diagnosis not present

## 2022-01-03 DIAGNOSIS — R9431 Abnormal electrocardiogram [ECG] [EKG]: Secondary | ICD-10-CM | POA: Diagnosis not present

## 2022-01-03 LAB — COMPREHENSIVE METABOLIC PANEL
ALT: 33 U/L (ref 0–44)
AST: 29 U/L (ref 15–41)
Albumin: 3.7 g/dL (ref 3.5–5.0)
Alkaline Phosphatase: 80 U/L (ref 38–126)
Anion gap: 7 (ref 5–15)
BUN: 9 mg/dL (ref 8–23)
CO2: 28 mmol/L (ref 22–32)
Calcium: 8.7 mg/dL — ABNORMAL LOW (ref 8.9–10.3)
Chloride: 91 mmol/L — ABNORMAL LOW (ref 98–111)
Creatinine, Ser: 0.62 mg/dL (ref 0.44–1.00)
GFR, Estimated: >60 mL/min (ref >60)
Glucose, Bld: 99 mg/dL (ref 70–99)
Potassium: 3.5 mmol/L (ref 3.5–5.1)
Sodium: 126 mmol/L — ABNORMAL LOW (ref 135–145)
Total Bilirubin: 0.8 mg/dL (ref 0.3–1.2)
Total Protein: 6.1 g/dL — ABNORMAL LOW (ref 6.5–8.1)

## 2022-01-03 LAB — SEDIMENTATION RATE: Sed Rate: 7 mm/hr (ref 0–22)

## 2022-01-03 LAB — URINALYSIS, ROUTINE W REFLEX MICROSCOPIC
Bacteria, UA: NONE SEEN
Bilirubin Urine: NEGATIVE
Glucose, UA: NEGATIVE mg/dL
Hgb urine dipstick: NEGATIVE
Ketones, ur: NEGATIVE mg/dL
Nitrite: NEGATIVE
Protein, ur: NEGATIVE mg/dL
Specific Gravity, Urine: 1.029 (ref 1.005–1.030)
pH: 7 (ref 5.0–8.0)

## 2022-01-03 LAB — CBC WITH DIFFERENTIAL/PLATELET
Abs Immature Granulocytes: 0.04 10*3/uL (ref 0.00–0.07)
Basophils Absolute: 0 10*3/uL (ref 0.0–0.1)
Basophils Relative: 0 %
Eosinophils Absolute: 0.1 10*3/uL (ref 0.0–0.5)
Eosinophils Relative: 1 %
HCT: 33 % — ABNORMAL LOW (ref 36.0–46.0)
Hemoglobin: 11.3 g/dL — ABNORMAL LOW (ref 12.0–15.0)
Immature Granulocytes: 1 %
Lymphocytes Relative: 33 %
Lymphs Abs: 2.8 10*3/uL (ref 0.7–4.0)
MCH: 34.2 pg — ABNORMAL HIGH (ref 26.0–34.0)
MCHC: 34.2 g/dL (ref 30.0–36.0)
MCV: 100 fL (ref 80.0–100.0)
Monocytes Absolute: 0.9 10*3/uL (ref 0.1–1.0)
Monocytes Relative: 10 %
Neutro Abs: 4.7 10*3/uL (ref 1.7–7.7)
Neutrophils Relative %: 55 %
Platelets: 241 10*3/uL (ref 150–400)
RBC: 3.3 MIL/uL — ABNORMAL LOW (ref 3.87–5.11)
RDW: 12 % (ref 11.5–15.5)
WBC: 8.6 10*3/uL (ref 4.0–10.5)
nRBC: 0 % (ref 0.0–0.2)

## 2022-01-03 LAB — MAGNESIUM: Magnesium: 1.6 mg/dL — ABNORMAL LOW (ref 1.7–2.4)

## 2022-01-03 LAB — C-REACTIVE PROTEIN: CRP: 0.6 mg/dL (ref <1.0)

## 2022-01-03 MED ORDER — LIDOCAINE 4 % EX PTCH: 1.0000 | MEDICATED_PATCH | CUTANEOUS | 0 refills | Status: AC

## 2022-01-03 MED ORDER — METHOCARBAMOL 500 MG PO TABS: 500.0000 mg | ORAL_TABLET | Freq: Three times a day (TID) | ORAL | 0 refills | Status: AC | PRN

## 2022-01-03 MED ORDER — KETOROLAC TROMETHAMINE 15 MG/ML IJ SOLN
15.0000 mg | Freq: Once | INTRAMUSCULAR | Status: AC
  Administered 2022-01-03: 15 mg via INTRAVENOUS
  Filled 2022-01-03: qty 1

## 2022-01-03 MED ORDER — LIDOCAINE 5 % EX PTCH
1.0000 | MEDICATED_PATCH | CUTANEOUS | Status: DC
  Administered 2022-01-03: 1 via TRANSDERMAL

## 2022-01-03 MED ORDER — MAGNESIUM SULFATE 2 GM/50ML IV SOLN
INTRAVENOUS | Status: AC
  Filled 2022-01-03: qty 50

## 2022-01-03 MED ORDER — OXYCODONE-ACETAMINOPHEN 5-325 MG PO TABS
1.0000 | ORAL_TABLET | Freq: Once | ORAL | Status: AC
  Administered 2022-01-03: 1 via ORAL
  Filled 2022-01-03: qty 1

## 2022-01-03 MED ORDER — IOHEXOL 300 MG/ML  SOLN
100.0000 mL | Freq: Once | INTRAMUSCULAR | Status: AC | PRN
  Administered 2022-01-03: 100 mL via INTRAVENOUS

## 2022-01-03 MED ORDER — LIDOCAINE 5 % EX PTCH
MEDICATED_PATCH | CUTANEOUS | Status: AC
  Filled 2022-01-03: qty 1

## 2022-01-03 MED ORDER — METHOCARBAMOL 500 MG PO TABS
500.0000 mg | ORAL_TABLET | Freq: Once | ORAL | Status: AC
  Administered 2022-01-03: 500 mg via ORAL

## 2022-01-03 MED ORDER — GADOBUTROL 1 MMOL/ML IV SOLN
7.0000 mL | Freq: Once | INTRAVENOUS | Status: AC | PRN
  Administered 2022-01-03: 7 mL via INTRAVENOUS

## 2022-01-03 MED ORDER — METHOCARBAMOL 500 MG PO TABS
ORAL_TABLET | ORAL | Status: AC
  Filled 2022-01-03: qty 1

## 2022-01-03 MED ORDER — MAGNESIUM SULFATE 2 GM/50ML IV SOLN
2.0000 g | Freq: Once | INTRAVENOUS | Status: AC
  Administered 2022-01-03: 2 g via INTRAVENOUS

## 2022-01-03 MED ORDER — LACTATED RINGERS IV BOLUS
500.0000 mL | Freq: Once | INTRAVENOUS | Status: AC
  Administered 2022-01-03: 500 mL via INTRAVENOUS

## 2022-01-03 NOTE — ED Notes (Signed)
Pt returned from CT, pt ambulated to the bathroom

## 2022-01-03 NOTE — ED Provider Notes (Signed)
[facility] Provider Note   CSN: [id number] Arrival date & time: 01/03/22  1027     History {Add pertinent medical, surgical, social history, OB history to HPI:1} No chief complaint on file.   Caitlin REININGER is a 76 y.o. female.  HPI Patient presents for back pain.  Her medical history includes arthritis, GERD, breast cancer, asthma, HLD, HTN.  Her breast cancer is in remission x5 years following lumpectomy and radiation.  She was seen in the ED 2-1/2 weeks ago for left-sided low back pain.  She was prescribed prednisone for treatment of sciatica.  Urine culture at that time grew only 20,000 colonies of E. coli.  She is subsequently seen in urgent care and she did get prescribed ciprofloxacin for treatment of UTI.  She finished that course of antibiotics.  She was prescribed Flexeril, which she has been taking in addition to Tylenol for pain control.  She was seen at [facility] 1 week ago.  She underwent a kidney ultrasound and was informed that results were normal.  Today she reports persistent low back pain that is worsening in severity.  Pain is most prominent on the right lower back.  It is worsened with movements.  She has had difficulty walking and started using a cane yesterday.  She states that this is more due to pain than weakness.  She denies any areas of numbness.  She has had recent urinary incontinence.    Home Medications Prior to Admission medications   Medication Sig Start Date End Date Taking? Authorizing Provider  albuterol (PROVENTIL) (2.5 MG/3ML) 0.083% nebulizer solution Take 3 mLs (2.5 mg total) by nebulization every 6 (six) hours as needed for wheezing or shortness of breath. 09/07/21 09/07/22  Parrett, Fonnie Mu, NP  albuterol (VENTOLIN HFA) 108 (90 Base) MCG/ACT inhaler Inhale 1-2 puffs into the lungs every 6 (six) hours as needed. 09/07/21   Parrett, Fonnie Mu, NP  amoxicillin (AMOXIL) 500 MG capsule Take 500 mg by mouth 3 (three) times daily.  10/07/21   [provider]  budesonide-formoterol (SYMBICORT) 160-4.5 MCG/ACT inhaler Inhale 2 puffs into the lungs 2 (two) times daily. 09/07/21   Parrett, Fonnie Mu, NP  calcium citrate-vitamin D (CALCIUM CITRATE CHEWY BITE) 500-500 MG-UNIT chewable tablet Chew 1 tablet by mouth in the morning. Bariatric - Calcium Citrate Chewy Bites '500mg'$  w/D3    [provider]  cephALEXin (KEFLEX) 500 MG capsule Take 1 capsule (500 mg total) by mouth 4 (four) times daily. 12/17/21   Volney American, PA-C  Cyanocobalamin (VITAMIN B-12) 2500 MCG SUBL Place 2,500 mcg under the tongue in the morning.    [provider]  dextromethorphan-guaiFENesin (MUCINEX DM) 30-600 MG 12hr tablet Take 1 tablet by mouth at bedtime as needed (cough/congestion).    [provider]  esomeprazole (NEXIUM) 20 MG capsule Take 40 mg by mouth daily before breakfast.     [provider]  famotidine (PEPCID) 20 MG tablet Take 20 mg by mouth daily in the afternoon.    [provider]  hydrochlorothiazide (HYDRODIURIL) 25 MG tablet Take 25 mg by mouth in the morning. 10/20/20   [provider]  IRON-VITAMIN C PO Take 1 tablet by mouth at bedtime. (Chewable) Celebrate Vitamins Iron+C+ Iron ('54mg'$ )+vitamin C ('90mg'$ )    [provider]  losartan (COZAAR) 100 MG tablet Take 100 mg by mouth in the morning. 10/20/20   [provider]  magnesium oxide (MAG-OX) 400 MG tablet Take 400 mg by mouth  daily at 6 PM. (1700)    [provider]  Melatonin 10 MG TABS Take 10-20 mg by mouth at bedtime as needed (sleep).    [provider]  Menaquinone-7 (VITAMIN K2) 100 MCG CAPS Take 100 mcg by mouth daily in the afternoon.    [provider]  Multiple Vitamins-Minerals (CELEBRATE MULTI-COMPLETE 60 PO) Take 1 capsule by mouth in the morning and at bedtime.    [provider]  pravastatin (PRAVACHOL) 40 MG tablet Take 40 mg by mouth at bedtime. 04/10/19    [provider]  Probiotic Product (PROBIOTIC DAILY PO) Take 1 capsule by mouth daily in the afternoon.    [provider]  Vitamin D3 (VITAMIN D) 25 MCG tablet Take 1,000 Units by mouth daily.    [provider]  vitamin k 100 MCG tablet Take 100 mcg by mouth daily.    [provider]      Allergies    Horse-derived products, Sulfa antibiotics, Sulfamethoxazole-trimethoprim, and Tetanus toxoid adsorbed    Review of Systems   Review of Systems  Genitourinary:  Positive for dysuria.       Urinary incontinence  Musculoskeletal:  Positive for back pain and gait problem.  All other systems reviewed and are negative.  Physical Exam Updated Vital Signs There were no vitals taken for this visit. Physical Exam Vitals and nursing note reviewed.  Constitutional:      General: She is not in acute distress.    Appearance: Normal appearance. She is well-developed. She is not ill-appearing, toxic-appearing or diaphoretic.  HENT:     Head: Normocephalic and atraumatic.     Right Ear: External ear normal.     Left Ear: External ear normal.     Nose: Nose normal.     Mouth/Throat:     Mouth: Mucous membranes are moist.     Pharynx: Oropharynx is clear.  Eyes:     Extraocular Movements: Extraocular movements intact.     Conjunctiva/sclera: Conjunctivae normal.  Cardiovascular:     Rate and Rhythm: Tachycardia present.     Heart sounds: No murmur heard. Pulmonary:     Effort: Pulmonary effort is normal. No respiratory distress.  Abdominal:     Palpations: Abdomen is soft.     Tenderness: There is no abdominal tenderness.  Musculoskeletal:        General: No swelling. Normal range of motion.     Cervical back: Normal range of motion and neck supple.     Right lower leg: No edema.     Left lower leg: No edema.  Skin:    General: Skin is warm and dry.     Coloration: Skin is not jaundiced or pale.  Neurological:     Mental Status: She is alert and  oriented to person, place, and time.     Cranial Nerves: Cranial nerves 2-12 are intact. No cranial nerve deficit, dysarthria or facial asymmetry.     Sensory: Sensation is intact. No sensory deficit.     Motor: Motor function is intact. No weakness or abnormal muscle tone.  Psychiatric:        Mood and Affect: Mood normal.        Behavior: Behavior normal.        Thought Content: Thought content normal.        Judgment: Judgment normal.    ED Results / Procedures / Treatments   Labs (all labs ordered are listed, but only abnormal results are displayed) Labs  Reviewed - No data to display  EKG None  Radiology No results found.  Procedures Procedures  {Document cardiac monitor, telemetry assessment procedure when appropriate:1}  Medications Ordered in ED Medications - No data to display  ED Course/ Medical Decision Making/ A&P                           Medical Decision Making  This patient presents to the ED for concern of low back pain, this involves an extensive number of treatment options, and is a complaint that carries with it a high risk of complications and morbidity.  The differential diagnosis includes lumbar stenosis, occult injury, neoplasm, bone metastases, pyelonephritis, nephrolithiasis   Co morbidities that complicate the patient evaluation  arthritis, GERD, breast cancer, asthma, HLD, HTN   Additional history obtained:  Additional history obtained from patient's husband External records from outside source obtained and reviewed including EMR   Lab Tests:  I Ordered, and personally interpreted labs.  The pertinent results include:  ***   Imaging Studies ordered:  I ordered imaging studies including ***  I independently visualized and interpreted imaging which showed *** I agree with the radiologist interpretation   Cardiac Monitoring: / EKG:  The patient was maintained on a cardiac monitor.  I personally viewed and interpreted the cardiac  monitored which showed an underlying rhythm of: ***   Consultations Obtained:  I requested consultation with the ***,  and discussed lab and imaging findings as well as pertinent plan - they recommend: ***   Problem List / ED Course / Critical interventions / Medication management  *** I ordered medication including ***  for ***  Reevaluation of the patient after these medicines showed that the patient {resolved/improved/worsened:23923::"improved"} I have reviewed the patients home medicines and have made adjustments as needed   Social Determinants of Health:  ***   Test / Admission - Considered:  ***   {Document critical care time when appropriate:1} {Document review of labs and clinical decision tools ie heart score, Chads2Vasc2 etc:1}  {Document your independent review of radiology images, and any outside records:1} {Document your discussion with family members, caretakers, and with consultants:1} {Document social determinants of health affecting pt's care:1} {Document your decision making why or why not admission, treatments were needed:1} Final Clinical Impression(s) / ED Diagnoses Final diagnoses:  None    Rx / DC Orders ED Discharge Orders     None

## 2022-01-03 NOTE — Discharge Instructions (Addendum)
Follow-up with your primary care doctor for continued pain management as well as results of urine culture, which should be available within the next 2 days.  Continue Tylenol and ibuprofen as needed.  Additionally, there were prescriptions sent to your pharmacy.    -Robaxin is a muscle relaxer.  This can also cause drowsiness and put you at risk for falls.  Take with caution.  -Lidocaine patches can help relieve local areas of pain.  These last for 24 hours.  Avoid having more than 1 patch on an any given time.  Return to the emergency department at any time for any new or worsening symptoms.

## 2022-01-05 LAB — URINE CULTURE: Culture: >=100000 — AB

## 2022-01-06 ENCOUNTER — Inpatient Hospital Stay (HOSPITAL_COMMUNITY): Payer: Medicare Other | Attending: Hematology | Admitting: Hematology

## 2022-01-06 ENCOUNTER — Telehealth: Payer: Self-pay

## 2022-01-06 ENCOUNTER — Telehealth (HOSPITAL_COMMUNITY): Payer: Self-pay | Admitting: Emergency Medicine

## 2022-01-06 VITALS — BP 152/79 | HR 85 | Temp 97.8°F | Resp 18 | Ht 62.0 in | Wt 222.4 lb

## 2022-01-06 DIAGNOSIS — M8580 Other specified disorders of bone density and structure, unspecified site: Secondary | ICD-10-CM | POA: Diagnosis not present

## 2022-01-06 DIAGNOSIS — Z79811 Long term (current) use of aromatase inhibitors: Secondary | ICD-10-CM | POA: Insufficient documentation

## 2022-01-06 DIAGNOSIS — C50211 Malignant neoplasm of upper-inner quadrant of right female breast: Secondary | ICD-10-CM | POA: Insufficient documentation

## 2022-01-06 DIAGNOSIS — Z170 Estrogen receptor positive status [ER+]: Secondary | ICD-10-CM | POA: Diagnosis not present

## 2022-01-06 NOTE — Patient Instructions (Addendum)
[facility] at [facility] Discharge Instructions  You were seen and examined today by Dr. Delton Coombes.  Dr. Delton Coombes discussed your most recent lab work and everything looks good, your mammogram looks good and your MRI of your back shows that you have some degenerative changes.   Dr. Delton Coombes sent referral to [facility] for your back pain.  Follow-up as scheduled with [facility] oncologist.    Thank you for choosing [facility] at [facility] to provide your oncology and hematology care.  To afford each patient quality time with our provider, please arrive at least 15 minutes before your scheduled appointment time.   If you have a lab appointment with the [facility] please come in thru the Main Entrance and check in at the main information desk.  You need to re-schedule your appointment should you arrive 10 or more minutes late.  We strive to give you quality time with our providers, and arriving late affects you and other patients whose appointments are after yours.  Also, if you no show three or more times for appointments you may be dismissed from the clinic at the providers discretion.     Again, thank you for choosing [facility].  Our hope is that these requests will decrease the amount of time that you wait before being seen by our physicians.       _____________________________________________________________  Should you have questions after your visit to [facility], please contact our office at [phone] and follow the prompts.  Our office hours are 8:00 a.m. and 4:30 p.m. Monday - Friday.  Please note that voicemails left after 4:00 p.m. may not be returned until the following business day.  We are closed weekends and major holidays.  You do have access to a nurse 24-7, just call the main number to the clinic [phone] and do not press any options, hold on the line and a nurse  will answer the phone.    For prescription refill requests, have your pharmacy contact our office and allow 72 hours.    Due to Covid, you will need to wear a mask upon entering the hospital. If you do not have a mask, a mask will be given to you at the Main Entrance upon arrival. For doctor visits, patients may have 1 support person age 49 or older with them. For treatment visits, patients can not have anyone with them due to social distancing guidelines and our immunocompromised population.

## 2022-01-06 NOTE — Telephone Encounter (Signed)
Post ED Visit - Positive Culture Follow-up: Successful Patient Follow-Up  Culture assessed and recommendations reviewed by:  '[x]'$  Sherlon Handing, Pharm.D. '[]'$  Heide Guile, Pharm.D., BCPS AQ-ID '[]'$  Parks Neptune, Pharm.D., BCPS '[]'$  Alycia Rossetti, Pharm.D., BCPS '[]'$  [place], [place].D., BCPS, AAHIVP '[]'$  Legrand Como, Pharm.D., BCPS, AAHIVP '[]'$  Salome Arnt, PharmD, BCPS '[]'$  Johnnette Gourd, PharmD, BCPS '[]'$  Hughes Better, PharmD, BCPS '[]'$  Leeroy Cha, PharmD  Positive urine culture  '[x]'$  Patient discharged without antimicrobial prescription and treatment is now indicated '[]'$  Organism is resistant to prescribed ED discharge antimicrobial '[]'$  Patient with positive blood cultures  Changes discussed with ED provider: Sherrell Puller, PA-C New antibiotic prescription Macrobid 100 mg po BID x 7 days Called to [facility] Dr. [name], [place]  Contacted patient, date 01/06/2022, time 10:40 am   Glennon Hamilton 01/06/2022, 10:43 AM

## 2022-01-06 NOTE — Progress Notes (Addendum)
ED Antimicrobial Stewardship Positive Culture Follow Up   Caitlin [name] is an 76 y.o. female who presented to [facility] on 01/03/2022 with a chief complaint of  Chief Complaint  Patient presents with   Back Pain    Recent Results (from the past 720 hour(s))  Urine Culture     Status: Abnormal   Collection Time: 12/17/21 12:43 PM   Specimen: Urine, Random  Result Value Ref Range Status   Specimen Description   Final    URINE, RANDOM Performed at [facility], [address], [place], [place] [address]    Special Requests   Final    NONE Performed at [facility], [address], [place], [place] [address]    Culture 20,000 COLONIES/mL ESCHERICHIA COLI (A)  Final   Report Status 12/20/2021 FINAL  Final   Organism ID, Bacteria ESCHERICHIA COLI (A)  Final      Susceptibility   Escherichia coli - MIC*    AMPICILLIN >=32 RESISTANT Resistant     CEFAZOLIN 16 SENSITIVE Sensitive     CEFEPIME <=0.12 SENSITIVE Sensitive     CEFTRIAXONE <=0.25 SENSITIVE Sensitive     CIPROFLOXACIN <=0.25 SENSITIVE Sensitive     GENTAMICIN <=1 SENSITIVE Sensitive     IMIPENEM <=0.25 SENSITIVE Sensitive     NITROFURANTOIN <=16 SENSITIVE Sensitive     TRIMETH/SULFA >=320 RESISTANT Resistant     AMPICILLIN/SULBACTAM >=32 RESISTANT Resistant     PIP/TAZO <=4 SENSITIVE Sensitive     * 20,000 COLONIES/mL ESCHERICHIA COLI  Urine Culture     Status: Abnormal   Collection Time: 01/03/22  8:00 AM   Specimen: Urine, Clean Catch  Result Value Ref Range Status   Specimen Description   Final    URINE, CLEAN CATCH Performed at [facility], [address] [place], [place] [address]    Special Requests   Final    NONE Performed at [facility], [address] [place], [place] [address]    Culture (A)  Final    >=100,000 COLONIES/mL ESCHERICHIA COLI Confirmed Extended Spectrum Beta-Lactamase Producer (ESBL).  In bloodstream infections from ESBL organisms, carbapenems are preferred over  piperacillin/tazobactam. They are shown to have a lower risk of mortality.    Report Status 01/05/2022 FINAL  Final   Organism ID, Bacteria ESCHERICHIA COLI (A)  Final      Susceptibility   Escherichia coli - MIC*    AMPICILLIN >=32 RESISTANT Resistant     CEFAZOLIN >=64 RESISTANT Resistant     CEFEPIME 16 RESISTANT Resistant     CEFTRIAXONE >=64 RESISTANT Resistant     CIPROFLOXACIN >=4 RESISTANT Resistant     GENTAMICIN 2 SENSITIVE Sensitive     IMIPENEM <=0.25 SENSITIVE Sensitive     NITROFURANTOIN <=16 SENSITIVE Sensitive     TRIMETH/SULFA <=20 SENSITIVE Sensitive     AMPICILLIN/SULBACTAM >=32 RESISTANT Resistant     PIP/TAZO 8 SENSITIVE Sensitive     * >=100,000 COLONIES/mL ESCHERICHIA COLI    '[x]'$  Patient discharged originally without antimicrobial agent and treatment is now indicated  New antibiotic prescription: Macrobid '100mg'$  BID x 7 days  ED Provider: Sherrell Puller, PA-C  Addendum [phone]) [facility] called PA and stated that pt has allergy to [place]. Allergy not documented in Epic. Pt states allergy is fever/chills. PA will call [facility] and will do Fosfomycin 3gm po x 1. PPRN to call patient.  Franky Macho 01/06/2022, 9:04 AM Clinical Pharmacist Monday - Friday phone -  [phone] Saturday - Sunday phone -  [phone]  

## 2022-01-06 NOTE — Progress Notes (Signed)
[facility] [address] [place] [address]   Patient Care Team: Sharilyn Sites, MD as PCP - [facility], [place], PA-C (Inactive) (Dermatology) Lavonna Monarch, MD as Consulting Physician (Dermatology)  SUMMARY OF ONCOLOGIC HISTORY: Oncology History  Breast cancer of upper-inner quadrant of right female breast ([place])  09/24/2014 Imaging   DEXA with normal bone density   10/06/2015 Imaging   Screening bilateral mammogram BI-RADS Category 0, R breast possible mass, L breast possible mass   10/20/2015 Imaging   Diagnostic B mammogram, R breast Ultrasound with suspicious mass in the R breast at the 1 o clock location 5 cm from the nipple, LN in the low R axillae with cortex nodularity   10/27/2015 Initial Biopsy   Ultrasound guided biopsy of R axillary LN, BENIGN. Ultrasound guided biopsy of R breast with invasive ductal carcinoma   11/04/2015 Initial Diagnosis   Breast cancer of upper-inner quadrant of right female breast ([place])   11/09/2015 Procedure   Radioactive seed localization R breast   11/11/2015 Surgery   R breast radioactive seed localized lumpectomy and seed targeted sentinel LN biopsy   11/11/2015 Pathology Results   Invasive ductal carcinoma Grade I/III spanning 1.1 cm, lobular neoplasia (LCIS) resection margins negative. 3 negative sentinel nodes ER (100%), PR (100%), Her2 neu negative pT1c, pN0   12/11/2015 Oncotype testing   Recurrence Score result 11, 10 year risk of distant recurrence with tamoxifen alone 8% (low-risk)   01/11/2016 - 02/04/2016 Radiation Therapy   Adjuvant breast radiation ([place]): 42.72 Gy in 16 fractions to the right breast   01/2016 -  Anti-estrogen oral therapy   Anastrazole 1 mg daily. Planned duration of therapy: 5 years.    10/06/2020 Genetic Testing   Negative hereditary cancer genetic testing: no pathogenic variants detected in [facility] Common Hereditary Cancers +RNA Panel.  The report date is October 06, 2020.   The Common Hereditary Cancers + RNA Panel offered by [facility] includes sequencing, deletion/duplication, and RNA testing of the following 47 genes: APC, ATM, AXIN2, BARD1, BMPR1A, BRCA1, BRCA2, BRIP1, CDH1, CDK4*, CDKN2A (p14ARF)*, CDKN2A (p16INK4a)*, CHEK2, CTNNA1, DICER1, EPCAM (Deletion/duplication testing only), GREM1 (promoter region deletion/duplication testing only), KIT, MEN1, MLH1, MSH2, MSH3, MSH6, MUTYH, NBN, NF1, NHTL1, PALB2, PDGFRA*, PMS2, POLD1, POLE, PTEN, RAD50, RAD51C, RAD51D, SDHB, SDHC, SDHD, SMAD4, SMARCA4. STK11, TP53, TSC1, TSC2, and VHL.  The following genes were evaluated for sequence changes only: SDHA and HOXB13 c.251G>A variant only.  RNA analysis is not performed for the * genes.       CHIEF COMPLIANT: Follow-up for breast cancer   INTERVAL HISTORY: Ms. LANGSTON SUMMERFIELD is a 76 y.o. female here today for follow up of her breast cancer. Her last visit was on 12/17/2020.   Today she reports feeling good. She reports constant lower back pain she underwent and MR lumbar spine on 6/5. She will be moving to [facility] in 2-3 weeks.    REVIEW OF SYSTEMS:   Review of Systems  Constitutional:  Negative for appetite change and fatigue.  Musculoskeletal:  Positive for back pain (6/10).  Psychiatric/Behavioral:  Positive for sleep disturbance.   All other systems reviewed and are negative.   I have reviewed the past medical history, past surgical history, social history and family history with the patient and they are unchanged from previous note.   ALLERGIES:   is allergic to horse-derived products, nitrofuran derivatives, sulfa antibiotics, sulfamethoxazole-trimethoprim, and tetanus toxoid adsorbed.   MEDICATIONS:  Current Outpatient Medications  Medication Sig Dispense Refill   albuterol (PROVENTIL) (2.5 MG/3ML) 0.083% nebulizer solution Take 3 mLs (2.5 mg total) by nebulization every 6 (six) hours as needed for wheezing or shortness of breath. 75 mL 5    albuterol (VENTOLIN HFA) 108 (90 Base) MCG/ACT inhaler Inhale 1-2 puffs into the lungs every 6 (six) hours as needed. 1 each 2   amoxicillin (AMOXIL) 500 MG capsule Take 500 mg by mouth 3 (three) times daily.     budesonide-formoterol (SYMBICORT) 160-4.5 MCG/ACT inhaler Inhale 2 puffs into the lungs 2 (two) times daily. 1 each 12   calcium citrate-vitamin D (CALCIUM CITRATE CHEWY BITE) 500-500 MG-UNIT chewable tablet Chew 1 tablet by mouth in the morning. Bariatric - Calcium Citrate Chewy Bites 571m w/D3     cephALEXin (KEFLEX) 500 MG capsule Take 1 capsule (500 mg total) by mouth 4 (four) times daily. 10 capsule 0   Cyanocobalamin (VITAMIN B-12) 2500 MCG SUBL Place 2,500 mcg under the tongue in the morning.     dextromethorphan-guaiFENesin (MUCINEX DM) 30-600 MG 12hr tablet Take 1 tablet by mouth at bedtime as needed (cough/congestion).     esomeprazole (NEXIUM) 20 MG capsule Take 40 mg by mouth daily before breakfast.      famotidine (PEPCID) 20 MG tablet Take 20 mg by mouth daily in the afternoon.     hydrochlorothiazide (HYDRODIURIL) 25 MG tablet Take 25 mg by mouth in the morning.     IRON-VITAMIN C PO Take 1 tablet by mouth at bedtime. (Chewable) Celebrate Vitamins Iron+C+ Iron (535m+vitamin C (908m    lidocaine (HM LIDOCAINE PATCH) 4 % Place 1 patch onto the skin daily. 5 patch 0   losartan (COZAAR) 100 MG tablet Take 100 mg by mouth in the morning.     magnesium oxide (MAG-OX) 400 MG tablet Take 400 mg by mouth daily at 6 PM. (1700)     Melatonin 10 MG TABS Take 10-20 mg by mouth at bedtime as needed (sleep).     Menaquinone-7 (VITAMIN K2) 100 MCG CAPS Take 100 mcg by mouth daily in the afternoon.     methocarbamol (ROBAXIN) 500 MG tablet Take 1 tablet (500 mg total) by mouth every 8 (eight) hours as needed for muscle spasms. 20 tablet 0   Multiple Vitamins-Minerals (CELEBRATE MULTI-COMPLETE 60 PO) Take 1 capsule by mouth in the morning and at bedtime.     pravastatin (PRAVACHOL) 40 MG  tablet Take 40 mg by mouth at bedtime.     Probiotic Product (PROBIOTIC DAILY PO) Take 1 capsule by mouth daily in the afternoon.     Vitamin D3 (VITAMIN D) 25 MCG tablet Take 1,000 Units by mouth daily.     vitamin k 100 MCG tablet Take 100 mcg by mouth daily.     No current facility-administered medications for this visit.     PHYSICAL EXAMINATION: Performance status (ECOG): 1 - Symptomatic but completely ambulatory  There were no vitals filed for this visit. Wt Readings from Last 3 Encounters:  10/14/21 222 lb 9.6 oz (101 kg)  09/15/21 219 lb 6.4 oz (99.5 kg)  09/07/21 222 lb (100.7 kg)   Physical Exam Vitals reviewed.  Constitutional:      Appearance: Normal appearance.  Cardiovascular:     Rate and Rhythm: Normal rate and regular rhythm.     Pulses: Normal pulses.     Heart sounds: Normal heart sounds.  Pulmonary:     Effort: Pulmonary effort is normal.     Breath sounds: Normal  breath sounds.  Chest:  Breasts:    Right: Skin change (UIQ lumpectomy scar) present. No swelling, bleeding, inverted nipple, mass, nipple discharge or tenderness.     Left: No swelling, bleeding, inverted nipple, mass, nipple discharge, skin change or tenderness.  Musculoskeletal:     Right lower leg: No edema.     Left lower leg: No edema.  Lymphadenopathy:     Upper Body:     Right upper body: No supraclavicular or axillary adenopathy.     Left upper body: No supraclavicular or axillary adenopathy.  Neurological:     General: No focal deficit present.     Mental Status: She is alert and oriented to person, place, and time.  Psychiatric:        Mood and Affect: Mood normal.        Behavior: Behavior normal.     Breast Exam Chaperone: Thana Ates     LABORATORY DATA:  I have reviewed the data as listed    Latest Ref Rng & Units 01/03/2022    8:32 AM 12/21/2021    1:32 PM 12/14/2020   10:12 AM  CMP  Glucose 70 - 99 mg/dL 99  183  109   BUN 8 - 23 mg/dL 9  14  8    Creatinine  0.44 - 1.00 mg/dL 0.62  0.72  0.58   Sodium 135 - 145 mmol/L 126  129  127   Potassium 3.5 - 5.1 mmol/L 3.5  3.9  4.1   Chloride 98 - 111 mmol/L 91  95  93   CO2 22 - 32 mmol/L 28  25  26    Calcium 8.9 - 10.3 mg/dL 8.7  9.2  9.2   Total Protein 6.5 - 8.1 g/dL 6.1  7.2  6.5   Total Bilirubin 0.3 - 1.2 mg/dL 0.8  0.4  0.8   Alkaline Phos 38 - 126 U/L 80  102  107   AST 15 - 41 U/L 29  39  36   ALT 0 - 44 U/L 33  33  25    No results found for: "CAN153" Lab Results  Component Value Date   WBC 8.6 01/03/2022   HGB 11.3 (L) 01/03/2022   HCT 33.0 (L) 01/03/2022   MCV 100.0 01/03/2022   PLT 241 01/03/2022   NEUTROABS 4.7 01/03/2022    ASSESSMENT:  1.  Stage I right breast cancer: - Patient had her regular screening mammogram on 10/05/2015 which was BI-RADS Category 0 incomplete. - She had a diagnostic mammogram on 10/20/2015 which was BI-RADS Category 4 suspicious.  It showed a 5 mm right breast mass at the 1 o'clock position. -They set her up for an ultrasound guided biopsy of the mass on 10/27/2015.  It showed invasive ductal carcinoma and was ER/PR 100% positive.  HER-2 negative.  Ki-67 at 3%. - She had a lumpectomy and sentinel node biopsy with Dr. Barry Dienes on 11/11/2015. -She was treated with adjuvant radiation. -Her Oncotype evaluation placed her in the low risk category. - 5 years of anastrozole from May 2017 through May 2022. - She had a negative genetic testing.  She reportedly had a daughter who was diagnosed with TNBC at age 55 and currently receiving chemotherapy.   2.  Osteopenia: - DEXA scan on 10/22/2017 with T score -1.4. -DEXA scan on 12/14/2020 with T score -1.1.    PLAN:  1. Stage I right breast cancer: - Physical examination today did not reveal any suspicious masses.  Right breast  upper inner quadrant lumpectomy scar is stable.  No palpable adenopathy. - Reviewed labs from 12/21/2021 which showed normal LFTs.  CBC was normal. - Mammogram on 01/03/2022 was BI-RADS Category  1. - She is planning to relocate to [facility].  We will make a referral to medical oncology in [facility]. - I have reviewed her recent MRI on 01/03/2022: No evidence of metastatic disease.  Multilevel degenerative disease.  She is complaining of worsening low back pain.  As per patient's request, I will make a referral to [facility].   2. Osteopenia: - Vitamin D was 51.  Continue calcium and vitamin D supplements.  Breast Cancer therapy associated bone loss: I have recommended calcium, Vitamin D and weight bearing exercises.  Orders placed this encounter:  No orders of the defined types were placed in this encounter.   The patient has a good understanding of the overall plan. She agrees with it. She will call with any problems that may develop before the next visit here.  Derek Jack, MD [place] [phone]   I, Thana Ates, am acting as a scribe for Dr. Derek Jack.  I, Derek Jack MD, have reviewed the above documentation for accuracy and completeness, and I agree with the above.

## 2022-01-06 NOTE — Telephone Encounter (Signed)
Was reviewing culture results with pharmacist.  Her culture result came back positive emergency for multiple strands.  Called in [place] for her antibiotic.  Pharmacy at [facility] called back reporting that she has an allergy to [place].  She does not have an allergy to [place] listed in epic.  We have now added that in.  We will substitute for fosfomycin 3 g no refills.  Called and spoke to [place], [facility] at [place] in [place].  Nursing staff to contact patient to alert her prescription.

## 2022-01-07 ENCOUNTER — Encounter (HOSPITAL_COMMUNITY): Payer: Self-pay | Admitting: Lab

## 2022-01-07 DIAGNOSIS — M62830 Muscle spasm of back: Secondary | ICD-10-CM | POA: Diagnosis not present

## 2022-01-07 DIAGNOSIS — M9905 Segmental and somatic dysfunction of pelvic region: Secondary | ICD-10-CM | POA: Diagnosis not present

## 2022-01-07 DIAGNOSIS — M9903 Segmental and somatic dysfunction of lumbar region: Secondary | ICD-10-CM | POA: Diagnosis not present

## 2022-01-07 DIAGNOSIS — M9902 Segmental and somatic dysfunction of thoracic region: Secondary | ICD-10-CM | POA: Diagnosis not present

## 2022-01-07 NOTE — Progress Notes (Unsigned)
Referral sent to [facility].  Records faxed on 6/9

## 2022-01-10 DIAGNOSIS — M9902 Segmental and somatic dysfunction of thoracic region: Secondary | ICD-10-CM | POA: Diagnosis not present

## 2022-01-10 DIAGNOSIS — Z6838 Body mass index (BMI) 38.0-38.9, adult: Secondary | ICD-10-CM | POA: Diagnosis not present

## 2022-01-10 DIAGNOSIS — M9903 Segmental and somatic dysfunction of lumbar region: Secondary | ICD-10-CM | POA: Diagnosis not present

## 2022-01-10 DIAGNOSIS — E6609 Other obesity due to excess calories: Secondary | ICD-10-CM | POA: Diagnosis not present

## 2022-01-10 DIAGNOSIS — M62830 Muscle spasm of back: Secondary | ICD-10-CM | POA: Diagnosis not present

## 2022-01-10 DIAGNOSIS — M545 Low back pain, unspecified: Secondary | ICD-10-CM | POA: Diagnosis not present

## 2022-01-10 DIAGNOSIS — M9905 Segmental and somatic dysfunction of pelvic region: Secondary | ICD-10-CM | POA: Diagnosis not present

## 2022-01-12 DIAGNOSIS — R300 Dysuria: Secondary | ICD-10-CM | POA: Diagnosis not present

## 2022-01-12 DIAGNOSIS — M9903 Segmental and somatic dysfunction of lumbar region: Secondary | ICD-10-CM | POA: Diagnosis not present

## 2022-01-12 DIAGNOSIS — M62830 Muscle spasm of back: Secondary | ICD-10-CM | POA: Diagnosis not present

## 2022-01-12 DIAGNOSIS — M9902 Segmental and somatic dysfunction of thoracic region: Secondary | ICD-10-CM | POA: Diagnosis not present

## 2022-01-12 DIAGNOSIS — M9905 Segmental and somatic dysfunction of pelvic region: Secondary | ICD-10-CM | POA: Diagnosis not present

## 2022-01-13 ENCOUNTER — Ambulatory Visit (HOSPITAL_COMMUNITY): Admission: RE | Admit: 2022-01-13 | Payer: Medicare Other | Source: Ambulatory Visit

## 2022-01-13 ENCOUNTER — Encounter (HOSPITAL_COMMUNITY): Payer: Self-pay

## 2022-01-13 ENCOUNTER — Other Ambulatory Visit: Payer: Self-pay

## 2022-01-13 ENCOUNTER — Emergency Department (HOSPITAL_COMMUNITY)
Admission: EM | Admit: 2022-01-13 | Discharge: 2022-01-13 | Discharge status: Against Medical Advice | Payer: Medicare Other | Attending: Emergency Medicine | Admitting: Emergency Medicine

## 2022-01-13 DIAGNOSIS — R600 Localized edema: Secondary | ICD-10-CM | POA: Insufficient documentation

## 2022-01-13 DIAGNOSIS — M545 Low back pain, unspecified: Secondary | ICD-10-CM | POA: Insufficient documentation

## 2022-01-13 DIAGNOSIS — Z5321 Procedure and treatment not carried out due to patient leaving prior to being seen by health care provider: Secondary | ICD-10-CM | POA: Insufficient documentation

## 2022-01-13 LAB — CBC WITH DIFFERENTIAL/PLATELET
Abs Immature Granulocytes: 0.02 10*3/uL (ref 0.00–0.07)
Basophils Absolute: 0 10*3/uL (ref 0.0–0.1)
Basophils Relative: 0 %
Eosinophils Absolute: 0.3 10*3/uL (ref 0.0–0.5)
Eosinophils Relative: 4 %
HCT: 33.1 % — ABNORMAL LOW (ref 36.0–46.0)
Hemoglobin: 11.4 g/dL — ABNORMAL LOW (ref 12.0–15.0)
Immature Granulocytes: 0 %
Lymphocytes Relative: 14 %
Lymphs Abs: 0.9 10*3/uL (ref 0.7–4.0)
MCH: 34.9 pg — ABNORMAL HIGH (ref 26.0–34.0)
MCHC: 34.4 g/dL (ref 30.0–36.0)
MCV: 101.2 fL — ABNORMAL HIGH (ref 80.0–100.0)
Monocytes Absolute: 0.6 10*3/uL (ref 0.1–1.0)
Monocytes Relative: 9 %
Neutro Abs: 4.4 10*3/uL (ref 1.7–7.7)
Neutrophils Relative %: 73 %
Platelets: 218 10*3/uL (ref 150–400)
RBC: 3.27 MIL/uL — ABNORMAL LOW (ref 3.87–5.11)
RDW: 11.9 % (ref 11.5–15.5)
WBC: 6.1 10*3/uL (ref 4.0–10.5)
nRBC: 0 % (ref 0.0–0.2)

## 2022-01-13 LAB — COMPREHENSIVE METABOLIC PANEL
ALT: 29 U/L (ref 0–44)
AST: 28 U/L (ref 15–41)
Albumin: 3.6 g/dL (ref 3.5–5.0)
Alkaline Phosphatase: 104 U/L (ref 38–126)
Anion gap: 10 (ref 5–15)
BUN: <5 mg/dL — ABNORMAL LOW (ref 8–23)
CO2: 27 mmol/L (ref 22–32)
Calcium: 9.2 mg/dL (ref 8.9–10.3)
Chloride: 93 mmol/L — ABNORMAL LOW (ref 98–111)
Creatinine, Ser: 0.66 mg/dL (ref 0.44–1.00)
GFR, Estimated: >60 mL/min (ref >60)
Glucose, Bld: 111 mg/dL — ABNORMAL HIGH (ref 70–99)
Potassium: 3.8 mmol/L (ref 3.5–5.1)
Sodium: 130 mmol/L — ABNORMAL LOW (ref 135–145)
Total Bilirubin: 0.7 mg/dL (ref 0.3–1.2)
Total Protein: 6 g/dL — ABNORMAL LOW (ref 6.5–8.1)

## 2022-01-13 LAB — BRAIN NATRIURETIC PEPTIDE: B Natriuretic Peptide: 120.9 pg/mL — ABNORMAL HIGH (ref 0.0–100.0)

## 2022-01-13 NOTE — ED Provider Triage Note (Signed)
Emergency Medicine Provider Triage Evaluation Note  Caitlin [name] , a 76 y.o. female  was evaluated in triage.  Pt complains of back pain.  Back pain has been present for multiple weeks and has gotten progressively worse.  Pain is primary located to the right lower back however does radiate to the left lower back.  Pain is worse with touch and movement.  Patient denies any recent falls or injuries.  Patient reports that she is having incontinence however when asked about these incidents she states that she knows she has to go to the bathroom however is not getting there in time.  Additionally patient reports swelling to bilateral lower extremities.  Review of Systems  Positive: Back pain, swelling to bilateral lower extremities Negative: Numbness, weakness, saddle anesthesia, fever, chills, dysuria, hematuria, urinary urgency, urinary frequency, chest pain, shortness of breath, hemoptysis  Physical Exam  BP 139/82 (BP Location: Left Arm)   Pulse 90   Temp 98.6 F (37 C) (Oral)   Resp 18   Ht '5\' 2"'$  (1.575 m)   Wt 100.7 kg   SpO2 100%   BMI 40.60 kg/m  Gen:   Awake, no distress   Resp:  Normal effort  MSK:   Moves extremities without difficulty; minimal swelling to bilateral lower extremities Other:  Diffuse tenderness to right lumbar back.  Sensation to light touch grossly intact to bilateral lower extremities.  Medical Decision Making  Medically screening exam initiated at 10:30 AM.  Appropriate orders placed.  KALYN HOFSTRA was informed that the remainder of the evaluation will be completed by another provider, this initial triage assessment does not replace that evaluation, and the importance of remaining in the ED until their evaluation is complete.     Loni Beckwith, [place] 01/13/22 1032

## 2022-01-13 NOTE — ED Notes (Signed)
Pt stated that she might leave around 6. Pt stated that shew think that he will take meds that doctor gave her and  take care of it at home.

## 2022-01-13 NOTE — ED Notes (Signed)
Pt and her husband left ER I saw them leave she and him wave goodbye

## 2022-01-13 NOTE — ED Triage Notes (Signed)
Reports back pain that started mid may and has been seen at [facility] and [facility]  but its getting worse.  Reports having incontinence but unsure if related to UTI she is being treated for.  Denies numbness or tingling to extremities or bowel loss.  Reports had pain down leg last night.

## 2022-01-18 ENCOUNTER — Emergency Department (HOSPITAL_COMMUNITY)
Admission: EM | Admit: 2022-01-18 | Discharge: 2022-01-18 | Disposition: A | Discharge status: Home / Self Care | Payer: Medicare Other | Attending: Emergency Medicine | Admitting: Emergency Medicine

## 2022-01-18 ENCOUNTER — Other Ambulatory Visit: Payer: Self-pay

## 2022-01-18 ENCOUNTER — Encounter (HOSPITAL_COMMUNITY): Payer: Self-pay

## 2022-01-18 DIAGNOSIS — Z96652 Presence of left artificial knee joint: Secondary | ICD-10-CM | POA: Diagnosis not present

## 2022-01-18 DIAGNOSIS — M5431 Sciatica, right side: Secondary | ICD-10-CM | POA: Insufficient documentation

## 2022-01-18 DIAGNOSIS — M545 Low back pain, unspecified: Secondary | ICD-10-CM | POA: Diagnosis not present

## 2022-01-18 DIAGNOSIS — Z96611 Presence of right artificial shoulder joint: Secondary | ICD-10-CM | POA: Diagnosis not present

## 2022-01-18 DIAGNOSIS — Z9011 Acquired absence of right breast and nipple: Secondary | ICD-10-CM | POA: Diagnosis not present

## 2022-01-18 DIAGNOSIS — Z09 Encounter for follow-up examination after completed treatment for conditions other than malignant neoplasm: Secondary | ICD-10-CM | POA: Insufficient documentation

## 2022-01-18 DIAGNOSIS — R300 Dysuria: Secondary | ICD-10-CM | POA: Diagnosis present

## 2022-01-18 DIAGNOSIS — Z79899 Other long term (current) drug therapy: Secondary | ICD-10-CM | POA: Insufficient documentation

## 2022-01-18 DIAGNOSIS — Z853 Personal history of malignant neoplasm of breast: Secondary | ICD-10-CM | POA: Insufficient documentation

## 2022-01-18 DIAGNOSIS — N390 Urinary tract infection, site not specified: Secondary | ICD-10-CM | POA: Diagnosis not present

## 2022-01-18 DIAGNOSIS — Z7951 Long term (current) use of inhaled steroids: Secondary | ICD-10-CM | POA: Diagnosis not present

## 2022-01-18 DIAGNOSIS — N3090 Cystitis, unspecified without hematuria: Secondary | ICD-10-CM | POA: Diagnosis not present

## 2022-01-18 DIAGNOSIS — G8929 Other chronic pain: Secondary | ICD-10-CM | POA: Insufficient documentation

## 2022-01-18 DIAGNOSIS — I10 Essential (primary) hypertension: Secondary | ICD-10-CM | POA: Diagnosis not present

## 2022-01-18 DIAGNOSIS — J45909 Unspecified asthma, uncomplicated: Secondary | ICD-10-CM | POA: Diagnosis not present

## 2022-01-18 LAB — URINALYSIS, ROUTINE W REFLEX MICROSCOPIC
Bilirubin Urine: NEGATIVE
Glucose, UA: NEGATIVE mg/dL
Hgb urine dipstick: NEGATIVE
Ketones, ur: NEGATIVE mg/dL
Nitrite: NEGATIVE
Protein, ur: NEGATIVE mg/dL
Specific Gravity, Urine: 1.006 (ref 1.005–1.030)
WBC, UA: >50 WBC/hpf — ABNORMAL HIGH (ref 0–5)
pH: 7 (ref 5.0–8.0)

## 2022-01-18 LAB — COMPREHENSIVE METABOLIC PANEL
ALT: 21 U/L (ref 0–44)
AST: 23 U/L (ref 15–41)
Albumin: 4 g/dL (ref 3.5–5.0)
Alkaline Phosphatase: 106 U/L (ref 38–126)
Anion gap: 8 (ref 5–15)
BUN: 8 mg/dL (ref 8–23)
CO2: 25 mmol/L (ref 22–32)
Calcium: 9.4 mg/dL (ref 8.9–10.3)
Chloride: 96 mmol/L — ABNORMAL LOW (ref 98–111)
Creatinine, Ser: 0.62 mg/dL (ref 0.44–1.00)
GFR, Estimated: >60 mL/min (ref >60)
Glucose, Bld: 97 mg/dL (ref 70–99)
Potassium: 3.6 mmol/L (ref 3.5–5.1)
Sodium: 129 mmol/L — ABNORMAL LOW (ref 135–145)
Total Bilirubin: 0.5 mg/dL (ref 0.3–1.2)
Total Protein: 6.8 g/dL (ref 6.5–8.1)

## 2022-01-18 LAB — CBC WITH DIFFERENTIAL/PLATELET
Abs Immature Granulocytes: 0.02 10*3/uL (ref 0.00–0.07)
Basophils Absolute: 0 10*3/uL (ref 0.0–0.1)
Basophils Relative: 1 %
Eosinophils Absolute: 0.4 10*3/uL (ref 0.0–0.5)
Eosinophils Relative: 5 %
HCT: 35.5 % — ABNORMAL LOW (ref 36.0–46.0)
Hemoglobin: 11.8 g/dL — ABNORMAL LOW (ref 12.0–15.0)
Immature Granulocytes: 0 %
Lymphocytes Relative: 21 %
Lymphs Abs: 1.4 10*3/uL (ref 0.7–4.0)
MCH: 33.7 pg (ref 26.0–34.0)
MCHC: 33.2 g/dL (ref 30.0–36.0)
MCV: 101.4 fL — ABNORMAL HIGH (ref 80.0–100.0)
Monocytes Absolute: 0.8 10*3/uL (ref 0.1–1.0)
Monocytes Relative: 12 %
Neutro Abs: 4 10*3/uL (ref 1.7–7.7)
Neutrophils Relative %: 61 %
Platelets: 260 10*3/uL (ref 150–400)
RBC: 3.5 MIL/uL — ABNORMAL LOW (ref 3.87–5.11)
RDW: 12.1 % (ref 11.5–15.5)
WBC: 6.5 10*3/uL (ref 4.0–10.5)
nRBC: 0 % (ref 0.0–0.2)

## 2022-01-18 MED ORDER — FOSFOMYCIN TROMETHAMINE 3 G PO PACK
3.0000 g | PACK | Freq: Once | ORAL | Status: AC
  Administered 2022-01-18: 3 g via ORAL
  Filled 2022-01-18: qty 3

## 2022-01-18 NOTE — ED Provider Notes (Signed)
[facility] EMERGENCY DEPARTMENT Provider Note   CSN: [id number] Arrival date & time: 01/18/22  1204     History  Chief Complaint  Patient presents with   Urinary Tract Infection   Follow-up    Caitlin [name] is a 76 y.o. female.   Urinary Tract Infection Associated symptoms: no abdominal pain, no fever and no vomiting    76 year old female presents emergency department with complaints of persistent urinary tract infection.  Patient complains of chronic intermittent urinary tract infections.  She was recently seen in the emergency department on 01/03/2022 and diagnosed with urinary tract infection and placed on ciprofloxacin.  She was recently seen by her PCP who told her to come to the emergency department after urine culture report showed resistance to ciprofloxacin for IV antibiotics.  She is still endorsing symptoms of dysuria as well as urinary frequency.  She secondarily endorses chronic back pain with right-sided sciatica of which she is seeing a neurologist this coming Monday for follow-up.  Denies saddle anesthesia, fever, bowel/bladder dysfunction, IV drug use.  Denies chest pain, shortness of breath, cough, congestion, abdominal pain, nausea/vomiting/diarrhea, vaginal discharge/bleeding, change in bowel habits.  Past Medical History:  Diagnosis Date   Arthritis    Asthma    Atypical mole 04/22/2020   mod-left lower back   Breast cancer ([place])    Breast disorder    cancer   Breast mass    benign   Cellulitis    Family history of breast cancer 09/09/2020   Family history of pancreatic cancer 09/09/2020   Family history of prostate cancer 09/09/2020   GERD (gastroesophageal reflux disease)    Heart murmur    AS CHILD  OUTGREW IT   History of hiatal hernia    Hyperlipidemia    Hypertension    Impaired fasting glucose    Rectocele    Varicose vein of leg    Past Surgical History:  Procedure Laterality Date   BREAST LUMPECTOMY WITH RADIOACTIVE SEED AND SENTINEL LYMPH  NODE BIOPSY Right 11/11/2015   Procedure: RIGHT BREAST RADIOACTIVE SEED GUIDED  LUMPECTOMY AND RADIOACTIVE SEED TARGETED SENTINEL LYMPH NODE BIOPSY;  Surgeon: Stark Klein, MD;  Location: [place];  Service: General;  Laterality: Right;   BREAST SURGERY Left    breast biopies x4   CHOLECYSTECTOMY  09/08/2010   COLONOSCOPY     COLONOSCOPY N/A 09/25/2013   Procedure: COLONOSCOPY;  Surgeon: Rogene Houston, MD;  [facility];  Service: Endoscopy;  Laterality: N/A;  100-moved to 1200 Ann to notify pt   COLONOSCOPY N/A 11/26/2020   Procedure: COLONOSCOPY;  Surgeon: Rogene Houston, MD;  Location[facility];  Service: Endoscopy;  Laterality: N/A;  am   HERNIA REPAIR  12/24/2010, 12-2014   TOTAL 4   LAPAROSCOPIC GASTRIC SLEEVE RESECTION  11-2013   at [place]  2007/2008   POLYPECTOMY  11/26/2020   Procedure: POLYPECTOMY;  Surgeon: Rogene Houston, MD;  [facility];  Service: Endoscopy;;   REVERSE SHOULDER ARTHROPLASTY Right 03/03/2020   Procedure: RIGHT REVERSE SHOULDER ARTHROPLASTY;  Surgeon: Meredith Pel, MD;  Location: [place];  Service: Orthopedics;  Laterality: Right;   TONSILLECTOMY  1968   TOTAL KNEE ARTHROPLASTY Left 05/02/2017   TOTAL KNEE ARTHROPLASTY Left 05/02/2017   Procedure: LEFT TOTAL KNEE ARTHROPLASTY;  Surgeon: [name], MD;  Location: [place];  Service: Orthopedics;  Laterality: Left;   UPPER GASTROINTESTINAL ENDOSCOPY  VAGINAL HYSTERECTOMY  1980s    Home Medications Prior to Admission medications   Medication Sig Start Date End Date Taking? Authorizing Provider  albuterol (PROVENTIL) (2.5 MG/3ML) 0.083% nebulizer solution Take 3 mLs (2.5 mg total) by nebulization every 6 (six) hours as needed for wheezing or shortness of breath. 09/07/21 09/07/22  Parrett, Fonnie Mu, NP  albuterol (VENTOLIN HFA) 108 (90 Base) MCG/ACT inhaler Inhale 1-2 puffs into the lungs every 6 (six) hours as needed. 09/07/21    Parrett, Fonnie Mu, NP  amoxicillin (AMOXIL) 500 MG capsule Take 500 mg by mouth 3 (three) times daily. 10/07/21   [provider]  budesonide-formoterol (SYMBICORT) 160-4.5 MCG/ACT inhaler Inhale 2 puffs into the lungs 2 (two) times daily. 09/07/21   Parrett, Fonnie Mu, NP  calcium citrate-vitamin D (CALCIUM CITRATE CHEWY BITE) 500-500 MG-UNIT chewable tablet Chew 1 tablet by mouth in the morning. Bariatric - Calcium Citrate Chewy Bites '500mg'$  w/D3    [provider]  cephALEXin (KEFLEX) 500 MG capsule Take 1 capsule (500 mg total) by mouth 4 (four) times daily. 12/17/21   Volney American, PA-C  Cyanocobalamin (VITAMIN B-12) 2500 MCG SUBL Place 2,500 mcg under the tongue in the morning.    [provider]  cyclobenzaprine (FLEXERIL) 10 MG tablet Take 10 mg by mouth 3 (three) times daily. 12/20/21   [provider]  dextromethorphan-guaiFENesin (MUCINEX DM) 30-600 MG 12hr tablet Take 1 tablet by mouth at bedtime as needed (cough/congestion).    [provider]  diclofenac (VOLTAREN) 50 MG EC tablet Take 50 mg by mouth 3 (three) times daily. 10/07/21   [provider]  esomeprazole (NEXIUM) 20 MG capsule Take 40 mg by mouth daily before breakfast.     [provider]  famotidine (PEPCID) 20 MG tablet Take 20 mg by mouth daily in the afternoon.    [provider]  hydrochlorothiazide (HYDRODIURIL) 25 MG tablet Take 25 mg by mouth in the morning. 10/20/20   [provider]  IRON-VITAMIN C PO Take 1 tablet by mouth at bedtime. (Chewable) Celebrate Vitamins Iron+C+ Iron ('54mg'$ )+vitamin C ('90mg'$ )    [provider]  lidocaine (HM LIDOCAINE PATCH) 4 % Place 1 patch onto the skin daily. 01/03/22   Godfrey Pick, MD  losartan (COZAAR) 100 MG tablet Take 100 mg by mouth in the morning. 10/20/20   [provider]  magnesium oxide (MAG-OX) 400 MG tablet Take 400 mg by mouth daily at 6 PM. (1700)    [provider]   Melatonin 10 MG TABS Take 10-20 mg by mouth at bedtime as needed (sleep).    [provider]  Menaquinone-7 (VITAMIN K2) 100 MCG CAPS Take 100 mcg by mouth daily in the afternoon.    [provider]  methocarbamol (ROBAXIN) 500 MG tablet Take 1 tablet (500 mg total) by mouth every 8 (eight) hours as needed for muscle spasms. 01/03/22   Godfrey Pick, MD  Multiple Vitamins-Minerals (CELEBRATE MULTI-COMPLETE 60 PO) Take 1 capsule by mouth in the morning and at bedtime.    [provider]  pravastatin (PRAVACHOL) 40 MG tablet Take 40 mg by mouth at bedtime. 04/10/19   [provider]  Probiotic Product (PROBIOTIC DAILY PO) Take 1 capsule by mouth daily in the afternoon.    [provider]  Vitamin D3 (VITAMIN D) 25 MCG tablet Take 1,000 Units by mouth daily.    [provider]  vitamin k 100 MCG tablet Take 100 mcg by mouth daily.  [provider]      Allergies    Horse-derived products, Nitrofuran derivatives, Sulfa antibiotics, Sulfamethoxazole-trimethoprim, and Tetanus toxoid adsorbed    Review of Systems   Review of Systems  Constitutional:  Negative for chills and fever.  HENT:  Negative for ear pain and sore throat.   Eyes:  Negative for pain and visual disturbance.  Respiratory:  Negative for cough and shortness of breath.   Cardiovascular:  Negative for chest pain and palpitations.  Gastrointestinal:  Negative for abdominal pain and vomiting.  Genitourinary:  Positive for dysuria and frequency. Negative for hematuria.  Musculoskeletal:  Negative for arthralgias and back pain.  Skin:  Negative for color change and rash.  Neurological:  Negative for seizures and syncope.  All other systems reviewed and are negative.   Physical Exam Updated Vital Signs BP (!) 160/84 (BP Location: Right Arm)   Pulse 87   Temp 97.9 F (36.6 C) (Oral)   Resp 20   Ht '5\' 2"'$  (1.575 m)   Wt 99.8 kg   SpO2 96%   BMI 40.24 kg/m  Physical  Exam Vitals and nursing note reviewed.  Constitutional:      General: She is not in acute distress.    Appearance: She is well-developed.  HENT:     Head: Normocephalic and atraumatic.  Eyes:     Conjunctiva/sclera: Conjunctivae normal.  Cardiovascular:     Rate and Rhythm: Normal rate and regular rhythm.     Heart sounds: No murmur heard. Pulmonary:     Effort: Pulmonary effort is normal. No respiratory distress.     Breath sounds: Normal breath sounds.  Abdominal:     Palpations: Abdomen is soft.     Tenderness: There is no abdominal tenderness. There is no right CVA tenderness or left CVA tenderness.     Comments: No suprapubic tenderness elicited on exam.  Musculoskeletal:        General: No swelling.     Cervical back: Neck supple.     Right lower leg: No edema.     Left lower leg: No edema.     Comments: No tenderness to palpation of midline C, T, L-spine.  Straight leg raise positive on the right side when ambulating left side.  Tenderness to palpation in right gluteal region.  No spasm noted.  No overlying skin abnormality noted.  Muscle strength 5 out of 5 for lower extremities.  No sensory deficits along major nerve distributions of lower extremities.  Posterior tibial pulses full and intact bilaterally.  Skin:    General: Skin is warm and dry.     Capillary Refill: Capillary refill takes less than 2 seconds.  Neurological:     Mental Status: She is alert.  Psychiatric:        Mood and Affect: Mood normal.     ED Results / Procedures / Treatments   Labs (all labs ordered are listed, but only abnormal results are displayed) Labs Reviewed  URINALYSIS, ROUTINE W REFLEX MICROSCOPIC - Abnormal; Notable for the following components:      Result Value   APPearance CLOUDY (*)    Leukocytes,Ua LARGE (*)    WBC, UA >50 (*)    Bacteria, UA FEW (*)    All other components within normal limits  COMPREHENSIVE METABOLIC PANEL - Abnormal; Notable for the following components:    Sodium 129 (*)    Chloride 96 (*)    All other components within normal limits  CBC WITH DIFFERENTIAL/PLATELET -  Abnormal; Notable for the following components:   RBC 3.50 (*)    Hemoglobin 11.8 (*)    HCT 35.5 (*)    MCV 101.4 (*)    All other components within normal limits    EKG None  Radiology No results found.  Procedures Procedures    Medications Ordered in ED Medications  fosfomycin (MONUROL) packet 3 g (3 g Oral Given 01/18/22 1449)    ED Course/ Medical Decision Making/ A&P Clinical Course as of 01/18/22 1511  Tue Jan 18, 2022  1447 Danelle Earthly of pharmacy regarding patient's recent urine culture.  He suggested one-time dose of 3 g of fosfomycin to treat ESBL. [CR]    Clinical Course User Index [CR] Wilnette Kales, PA                           Medical Decision Making Amount and/or Complexity of Data Reviewed Labs: ordered.   This patient presents to the ED for concern of urinary tract infection, this involves an extensive number of treatment options, and is a complaint that carries with it a high risk of complications and morbidity.  The differential diagnosis includes urinary tract infection, pyelonephritis, nephrolithiasis, sepsis,   Co morbidities that complicate the patient evaluation  Recurrent UTI, chronic back pain   Additional history obtained:  Additional history obtained from urine culture from 01/03/2022 External records from outside source obtained and reviewed including ESBL with multiple antibiotic resistant   Lab Tests:  I Ordered, and personally interpreted labs.  The pertinent results include: Urinalysis indicative of urinary tract infection with large leukocytes, greater than 50 WBC and few bacteria with 0-5 squamous cells.  Sodium 129 the patient baseline seems to be between 126 and 131 given prior CMP's drawn.   Imaging Studies ordered:  N/a   Cardiac Monitoring: / EKG:  The patient was maintained on a cardiac  monitor.  I personally viewed and interpreted the cardiac monitored which showed an underlying rhythm of: Sinus rhythm   Consultations Obtained:  I requested consultation with the pharmacist regarding proper antibiotic therapy,  and discussed lab and imaging findings as well as pertinent plan - they recommend: 1 dose of fosfomycin 3 g   Problem List / ED Course / Critical interventions / Medication management  Urinary tract infection I ordered medication including fosfomycin for treatment of acute cystitis   Reevaluation of the patient after these medicines showed that the patient improved I have reviewed the patients home medicines and have made adjustments as needed   Social Determinants of Health:  Denies tobacco, alcohol, illicit drug use.   Test / Admission - Considered:  Urinary tract infection Vitals signs significant for hypertension with blood pressure 160 refill.  Patient recommended close follow-up PCP for further blood pressure management.. Otherwise within normal range and stable throughout visit. Laboratory studies significant for: Urinary tract infection.  Patient treated in the ED with 3 g of fosfomycin x1.  Close follow-up with PCP recommended for further symptomatic therapy.  Given lack of CVA tenderness as well as fever/white count, no concern for pyelonephritis at this time.  Patient endorsing symptoms related to nephrolithiasis either.  Follow-up with urology recommended if patient continues to have recurrent urinary tract infection symptoms. Recommend close follow-up with neurosurgery on Monday for her appointment regarding chronic back pain with right-sided sciatica.  Given recent steroid use, I will refrain from prescribing steroids at this point time.  Over-the-counter Tylenol/ibuprofen as needed for  pain Worrisome signs and symptoms were discussed with the patient, and the patient acknowledged understanding to return to the ED if noticed. Patient was stable upon  discharge.          Final Clinical Impression(s) / ED Diagnoses Final diagnoses:  Cystitis    Rx / DC Orders ED Discharge Orders     None         Wilnette Kales, [place] 01/18/22 1534    Noemi Chapel, MD 01/19/22 [phone]

## 2022-01-18 NOTE — Discharge Instructions (Addendum)
You were treated for urinary tract infection today in the emergency department with 3 g of fosfomycin.  Continue to drink plenty of water.  Follow-up with urology/PCP concerning potential recurrent urinary tract infections.  Keep appointment Monday with your neurosurgeon regarding your chronic back pain.  I do not feel the need to prescribe steroids at this time given your recent steroid intake.  Please do not hesitate to return to the emergency department if the worrisome signs and symptoms we discussed become apparent.

## 2022-01-18 NOTE — ED Triage Notes (Signed)
Pt reports back pain that started mid May. Pt has been seen at [facility], [facility] ED, and here. Pt states she has been treated for UTI and every time is showing bacteria in her urine. Pt has been on abx. Pt told to come to ED by PCP. No worsening in symptoms today.

## 2022-01-18 NOTE — ED Provider Notes (Incomplete)
[facility] Provider Note   CSN: [id number] Arrival date & time: 01/18/22  1204     History {Add pertinent medical, surgical, social history, OB history to HPI:1} Chief Complaint  Patient presents with  . Urinary Tract Infection  . Follow-up    Caitlin [name] is a 76 y.o. female.   Urinary Tract Infection Associated symptoms: no abdominal pain, no fever and no vomiting    76 year old female presents emergency department with complaints of persistent urinary tract infection.  Patient complains of chronic intermittent urinary tract infections.  She was recently seen in the emergency department on 01/03/2022 and diagnosed with urinary tract infection and placed on ciprofloxacin.  She was recently seen by her PCP who told her to come to the emergency department after urine culture report showed resistance to ciprofloxacin for IV antibiotics.  She is still endorsing symptoms of dysuria as well as urinary frequency.  She secondarily endorses chronic back pain with right-sided sciatica of which she is seeing a neurologist this coming Monday for follow-up.  Denies saddle anesthesia, fever, bowel/bladder dysfunction, IV drug use.  Denies chest pain, shortness of breath, cough, congestion, abdominal pain, nausea/vomiting/diarrhea, vaginal discharge/bleeding, change in bowel habits.  Past Medical History:  Diagnosis Date  . Arthritis   . Asthma   . Atypical mole 04/22/2020   mod-left lower back  . Breast cancer ([place])   . Breast disorder    cancer  . Breast mass    benign  . Cellulitis   . Family history of breast cancer 09/09/2020  . Family history of pancreatic cancer 09/09/2020  . Family history of prostate cancer 09/09/2020  . GERD (gastroesophageal reflux disease)   . Heart murmur    AS CHILD  OUTGREW IT  . History of hiatal hernia   . Hyperlipidemia   . Hypertension   . Impaired fasting glucose   . Rectocele   . Varicose vein of leg    Past Surgical History:   Procedure Laterality Date  . BREAST LUMPECTOMY WITH RADIOACTIVE SEED AND SENTINEL LYMPH NODE BIOPSY Right 11/11/2015   Procedure: RIGHT BREAST RADIOACTIVE SEED GUIDED  LUMPECTOMY AND RADIOACTIVE SEED TARGETED SENTINEL LYMPH NODE BIOPSY;  Surgeon: Stark Klein, MD;  Location: [facility];  Service: General;  Laterality: Right;  . BREAST SURGERY Left    breast biopies x4  . CHOLECYSTECTOMY  09/08/2010  . COLONOSCOPY    . COLONOSCOPY N/A 09/25/2013   Procedure: COLONOSCOPY;  Surgeon: Rogene Houston, MD;  [facility];  Service: Endoscopy;  Laterality: N/A;  100-moved to 1200 Ann to notify pt  . COLONOSCOPY N/A 11/26/2020   Procedure: COLONOSCOPY;  Surgeon: Rogene Houston, MD;  [facility];  Service: Endoscopy;  Laterality: N/A;  am  . HERNIA REPAIR  12/24/2010, 12-2014   TOTAL 4  . LAPAROSCOPIC GASTRIC SLEEVE RESECTION  11-2013   at [place]  2007/2008  . POLYPECTOMY  11/26/2020   Procedure: POLYPECTOMY;  Surgeon: Rogene Houston, MD;  [facility];  Service: Endoscopy;;  . REVERSE SHOULDER ARTHROPLASTY Right 03/03/2020   Procedure: RIGHT REVERSE SHOULDER ARTHROPLASTY;  Surgeon: Meredith Pel, MD;  Location: [facility];  Service: Orthopedics;  Laterality: Right;  . TONSILLECTOMY  1968  . TOTAL KNEE ARTHROPLASTY Left 05/02/2017  . TOTAL KNEE ARTHROPLASTY Left 05/02/2017   Procedure: LEFT TOTAL KNEE ARTHROPLASTY;  Surgeon: [name], MD;  Location: [facility];  Service: Orthopedics;  Laterality: Left;  . UPPER GASTROINTESTINAL ENDOSCOPY    . VAGINAL HYSTERECTOMY  1980s    Home Medications Prior to Admission medications   Medication Sig Start Date End Date Taking? Authorizing Provider  albuterol (PROVENTIL) (2.5 MG/3ML) 0.083% nebulizer solution Take 3 mLs (2.5 mg total) by nebulization every 6 (six) hours as needed for wheezing or shortness of breath. 09/07/21 09/07/22  Parrett, Fonnie Mu, NP  albuterol (VENTOLIN  HFA) 108 (90 Base) MCG/ACT inhaler Inhale 1-2 puffs into the lungs every 6 (six) hours as needed. 09/07/21   Parrett, Fonnie Mu, NP  amoxicillin (AMOXIL) 500 MG capsule Take 500 mg by mouth 3 (three) times daily. 10/07/21   [provider]  budesonide-formoterol (SYMBICORT) 160-4.5 MCG/ACT inhaler Inhale 2 puffs into the lungs 2 (two) times daily. 09/07/21   Parrett, Fonnie Mu, NP  calcium citrate-vitamin D (CALCIUM CITRATE CHEWY BITE) 500-500 MG-UNIT chewable tablet Chew 1 tablet by mouth in the morning. Bariatric - Calcium Citrate Chewy Bites '500mg'$  w/D3    [provider]  cephALEXin (KEFLEX) 500 MG capsule Take 1 capsule (500 mg total) by mouth 4 (four) times daily. 12/17/21   Volney American, PA-C  Cyanocobalamin (VITAMIN B-12) 2500 MCG SUBL Place 2,500 mcg under the tongue in the morning.    [provider]  cyclobenzaprine (FLEXERIL) 10 MG tablet Take 10 mg by mouth 3 (three) times daily. 12/20/21   [provider]  dextromethorphan-guaiFENesin (MUCINEX DM) 30-600 MG 12hr tablet Take 1 tablet by mouth at bedtime as needed (cough/congestion).    [provider]  diclofenac (VOLTAREN) 50 MG EC tablet Take 50 mg by mouth 3 (three) times daily. 10/07/21   [provider]  esomeprazole (NEXIUM) 20 MG capsule Take 40 mg by mouth daily before breakfast.     [provider]  famotidine (PEPCID) 20 MG tablet Take 20 mg by mouth daily in the afternoon.    [provider]  hydrochlorothiazide (HYDRODIURIL) 25 MG tablet Take 25 mg by mouth in the morning. 10/20/20   [provider]  IRON-VITAMIN C PO Take 1 tablet by mouth at bedtime. (Chewable) Celebrate Vitamins Iron+C+ Iron ('54mg'$ )+vitamin C ('90mg'$ )    [provider]  lidocaine (HM LIDOCAINE PATCH) 4 % Place 1 patch onto the skin daily. 01/03/22   Godfrey Pick, MD  losartan (COZAAR) 100 MG tablet Take 100 mg by mouth in the morning. 10/20/20   [provider]   magnesium oxide (MAG-OX) 400 MG tablet Take 400 mg by mouth daily at 6 PM. (1700)    [provider]  Melatonin 10 MG TABS Take 10-20 mg by mouth at bedtime as needed (sleep).    [provider]  Menaquinone-7 (VITAMIN K2) 100 MCG CAPS Take 100 mcg by mouth daily in the afternoon.    [provider]  methocarbamol (ROBAXIN) 500 MG tablet Take 1 tablet (500 mg total) by mouth every 8 (eight) hours as needed for muscle spasms. 01/03/22   Godfrey Pick, MD  Multiple Vitamins-Minerals (CELEBRATE MULTI-COMPLETE 60 PO) Take 1 capsule by mouth in the morning and at bedtime.    [provider]  pravastatin (PRAVACHOL) 40 MG tablet Take 40 mg by mouth at bedtime. 04/10/19   [provider]  Probiotic Product (PROBIOTIC DAILY PO) Take 1 capsule by mouth daily in the afternoon.    [provider]  Vitamin D3 (VITAMIN D) 25 MCG tablet Take 1,000 Units by mouth daily.    [provider]  vitamin  k 100 MCG tablet Take 100 mcg by mouth daily.    [provider]      Allergies    Horse-derived products, Nitrofuran derivatives, Sulfa antibiotics, Sulfamethoxazole-trimethoprim, and Tetanus toxoid adsorbed    Review of Systems   Review of Systems  Constitutional:  Negative for chills and fever.  HENT:  Negative for ear pain and sore throat.   Eyes:  Negative for pain and visual disturbance.  Respiratory:  Negative for cough and shortness of breath.   Cardiovascular:  Negative for chest pain and palpitations.  Gastrointestinal:  Negative for abdominal pain and vomiting.  Genitourinary:  Positive for dysuria and frequency. Negative for hematuria.  Musculoskeletal:  Negative for arthralgias and back pain.  Skin:  Negative for color change and rash.  Neurological:  Negative for seizures and syncope.  All other systems reviewed and are negative.   Physical Exam Updated Vital Signs BP (!) 160/84 (BP Location: Right Arm)   Pulse 87   Temp  97.9 F (36.6 C) (Oral)   Resp 20   Ht '5\' 2"'$  (1.575 m)   Wt 99.8 kg   SpO2 96%   BMI 40.24 kg/m  Physical Exam Vitals and nursing note reviewed.  Constitutional:      General: She is not in acute distress.    Appearance: She is well-developed.  HENT:     Head: Normocephalic and atraumatic.  Eyes:     Conjunctiva/sclera: Conjunctivae normal.  Cardiovascular:     Rate and Rhythm: Normal rate and regular rhythm.     Heart sounds: No murmur heard. Pulmonary:     Effort: Pulmonary effort is normal. No respiratory distress.     Breath sounds: Normal breath sounds.  Abdominal:     Palpations: Abdomen is soft.     Tenderness: There is no abdominal tenderness. There is no right CVA tenderness or left CVA tenderness.     Comments: No suprapubic tenderness elicited on exam.  Musculoskeletal:        General: No swelling.     Cervical back: Neck supple.     Right lower leg: No edema.     Left lower leg: No edema.     Comments: No tenderness to palpation of midline C, T, L-spine.  Straight leg raise positive on the right side when ambulating left side.  Tenderness to palpation in right gluteal region.  No spasm noted.  No overlying skin abnormality noted.  Muscle strength 5 out of 5 for lower extremities.  No sensory deficits along major nerve distributions of lower extremities.  Posterior tibial pulses full and intact bilaterally.  Skin:    General: Skin is warm and dry.     Capillary Refill: Capillary refill takes less than 2 seconds.  Neurological:     Mental Status: She is alert.  Psychiatric:        Mood and Affect: Mood normal.     ED Results / Procedures / Treatments   Labs (all labs ordered are listed, but only abnormal results are displayed) Labs Reviewed  URINALYSIS, ROUTINE W REFLEX MICROSCOPIC  COMPREHENSIVE METABOLIC PANEL  CBC WITH DIFFERENTIAL/PLATELET    EKG None  Radiology No results found.  Procedures Procedures  {Document cardiac monitor, telemetry  assessment procedure when appropriate:1}  Medications Ordered in ED Medications - No data to display  ED Course/ Medical Decision Making/ A&P  Medical Decision Making Amount and/or Complexity of Data Reviewed Labs: ordered.   This patient presents to the ED for concern of urinary tract infection, this involves an extensive number of treatment options, and is a complaint that carries with it a high risk of complications and morbidity.  The differential diagnosis includes urinary tract infection, pyelonephritis, nephrolithiasis, sepsis,   Co morbidities that complicate the patient evaluation  ***   Additional history obtained:  Additional history obtained from *** External records from outside source obtained and reviewed including ***   Lab Tests:  I Ordered, and personally interpreted labs.  The pertinent results include:  ***   Imaging Studies ordered:  I ordered imaging studies including ***  I independently visualized and interpreted imaging which showed *** I agree with the radiologist interpretation   Cardiac Monitoring: / EKG:  The patient was maintained on a cardiac monitor.  I personally viewed and interpreted the cardiac monitored which showed an underlying rhythm of: ***   Consultations Obtained:  I requested consultation with the ***,  and discussed lab and imaging findings as well as pertinent plan - they recommend: ***   Problem List / ED Course / Critical interventions / Medication management  *** I ordered medication including ***  for ***  Reevaluation of the patient after these medicines showed that the patient {resolved/improved/worsened:23923::"improved"} I have reviewed the patients home medicines and have made adjustments as needed   Social Determinants of Health:  ***   Test / Admission - Considered:  ***   {Document critical care time when appropriate:1} {Document review of labs and clinical decision  tools ie heart score, Chads2Vasc2 etc:1}  {Document your independent review of radiology images, and any outside records:1} {Document your discussion with family members, caretakers, and with consultants:1} {Document social determinants of health affecting pt's care:1} {Document your decision making why or why not admission, treatments were needed:1} Final Clinical Impression(s) / ED Diagnoses Final diagnoses:  None    Rx / DC Orders ED Discharge Orders     None

## 2022-01-19 ENCOUNTER — Encounter: Payer: Self-pay | Admitting: Infectious Diseases

## 2022-01-24 DIAGNOSIS — M48061 Spinal stenosis, lumbar region without neurogenic claudication: Secondary | ICD-10-CM | POA: Diagnosis not present

## 2022-01-24 DIAGNOSIS — Z6838 Body mass index (BMI) 38.0-38.9, adult: Secondary | ICD-10-CM | POA: Diagnosis not present

## 2022-01-24 DIAGNOSIS — M4316 Spondylolisthesis, lumbar region: Secondary | ICD-10-CM | POA: Diagnosis not present

## 2022-01-24 DIAGNOSIS — S32010A Wedge compression fracture of first lumbar vertebra, initial encounter for closed fracture: Secondary | ICD-10-CM | POA: Diagnosis not present

## 2022-01-28 DIAGNOSIS — I10 Essential (primary) hypertension: Secondary | ICD-10-CM | POA: Diagnosis not present

## 2022-01-28 DIAGNOSIS — E782 Mixed hyperlipidemia: Secondary | ICD-10-CM | POA: Diagnosis not present

## 2022-02-07 DIAGNOSIS — M549 Dorsalgia, unspecified: Secondary | ICD-10-CM | POA: Diagnosis not present

## 2022-02-07 DIAGNOSIS — M2041 Other hammer toe(s) (acquired), right foot: Secondary | ICD-10-CM | POA: Diagnosis not present

## 2022-02-07 DIAGNOSIS — M48061 Spinal stenosis, lumbar region without neurogenic claudication: Secondary | ICD-10-CM | POA: Diagnosis not present

## 2022-02-07 DIAGNOSIS — M5416 Radiculopathy, lumbar region: Secondary | ICD-10-CM | POA: Diagnosis not present

## 2022-02-07 DIAGNOSIS — M4316 Spondylolisthesis, lumbar region: Secondary | ICD-10-CM | POA: Diagnosis not present

## 2022-03-22 ENCOUNTER — Telehealth: Payer: Self-pay | Admitting: Orthopaedic Surgery

## 2022-03-22 NOTE — Telephone Encounter (Signed)
Letter has been created and fax to dental office

## 2022-03-22 NOTE — Telephone Encounter (Signed)
[place] with [facility] called and states that need something sent over stating this pt with [facility] need premed before procedures.   FAX# [phone]   CB# [phone]

## 2022-06-13 ENCOUNTER — Encounter (INDEPENDENT_AMBULATORY_CARE_PROVIDER_SITE_OTHER): Payer: Self-pay | Admitting: Gastroenterology

## 2024-02-26 NOTE — Progress Notes (Signed)
 Erroneous encounter
